# Patient Record
Sex: Male | Born: 1935 | Race: White | Hispanic: No | Smoking: Former smoker
Health system: Southern US, Community
[De-identification: ages and names within clinical notes are randomized; demographics above are authoritative.]

## PROBLEM LIST (undated history)

## (undated) DIAGNOSIS — R011 Cardiac murmur, unspecified: Secondary | ICD-10-CM

## (undated) DIAGNOSIS — I10 Essential (primary) hypertension: Secondary | ICD-10-CM

## (undated) DIAGNOSIS — I4891 Unspecified atrial fibrillation: Secondary | ICD-10-CM

## (undated) DIAGNOSIS — K469 Unspecified abdominal hernia without obstruction or gangrene: Secondary | ICD-10-CM

## (undated) DIAGNOSIS — K219 Gastro-esophageal reflux disease without esophagitis: Secondary | ICD-10-CM

## (undated) DIAGNOSIS — E7800 Pure hypercholesterolemia, unspecified: Secondary | ICD-10-CM

## (undated) DIAGNOSIS — C61 Malignant neoplasm of prostate: Secondary | ICD-10-CM

## (undated) DIAGNOSIS — N400 Enlarged prostate without lower urinary tract symptoms: Secondary | ICD-10-CM

## (undated) DIAGNOSIS — C3490 Malignant neoplasm of unspecified part of unspecified bronchus or lung: Secondary | ICD-10-CM

## (undated) DIAGNOSIS — I350 Nonrheumatic aortic (valve) stenosis: Secondary | ICD-10-CM

## (undated) DIAGNOSIS — E119 Type 2 diabetes mellitus without complications: Secondary | ICD-10-CM

## (undated) DIAGNOSIS — K5730 Diverticulosis of large intestine without perforation or abscess without bleeding: Secondary | ICD-10-CM

## (undated) DIAGNOSIS — J90 Pleural effusion, not elsewhere classified: Secondary | ICD-10-CM

## (undated) HISTORY — PX: INGUINAL HERNIA REPAIR: SHX194

## (undated) HISTORY — DX: Pure hypercholesterolemia, unspecified: E78.00

## (undated) HISTORY — DX: Diverticulosis of large intestine without perforation or abscess without bleeding: K57.30

## (undated) HISTORY — DX: Malignant neoplasm of prostate: C61

## (undated) HISTORY — DX: Type 2 diabetes mellitus without complications: E11.9

## (undated) HISTORY — PX: RADIOACTIVE SEED IMPLANT: SHX5150

## (undated) HISTORY — DX: Gastro-esophageal reflux disease without esophagitis: K21.9

## (undated) HISTORY — DX: Benign prostatic hyperplasia without lower urinary tract symptoms: N40.0

## (undated) HISTORY — DX: Essential (primary) hypertension: I10

## (undated) HISTORY — DX: Nonrheumatic aortic (valve) stenosis: I35.0

---

## 2005-01-10 ENCOUNTER — Ambulatory Visit: Payer: Self-pay | Admitting: Endocrinology

## 2005-01-31 ENCOUNTER — Ambulatory Visit: Payer: Self-pay | Admitting: Endocrinology

## 2006-01-21 ENCOUNTER — Ambulatory Visit: Payer: Self-pay | Admitting: Endocrinology

## 2006-12-24 ENCOUNTER — Ambulatory Visit: Payer: Self-pay | Admitting: Endocrinology

## 2007-01-21 ENCOUNTER — Ambulatory Visit: Payer: Self-pay | Admitting: Endocrinology

## 2007-01-21 LAB — CONVERTED CEMR LAB
AST: 22 units/L (ref 0–37)
Bilirubin, Direct: 0.1 mg/dL (ref 0.0–0.3)
Chloride: 107 meq/L (ref 96–112)
Cholesterol: 147 mg/dL (ref 0–200)
Creatinine, Ser: 1.1 mg/dL (ref 0.4–1.5)
Glucose, Bld: 144 mg/dL — ABNORMAL HIGH (ref 70–99)
PSA: 4.93 ng/mL — ABNORMAL HIGH (ref 0.10–4.00)
Sodium: 142 meq/L (ref 135–145)
Total Bilirubin: 0.8 mg/dL (ref 0.3–1.2)

## 2007-02-07 ENCOUNTER — Encounter: Payer: Self-pay | Admitting: *Deleted

## 2007-02-07 DIAGNOSIS — K219 Gastro-esophageal reflux disease without esophagitis: Secondary | ICD-10-CM

## 2007-02-07 DIAGNOSIS — K5730 Diverticulosis of large intestine without perforation or abscess without bleeding: Secondary | ICD-10-CM | POA: Insufficient documentation

## 2007-02-07 DIAGNOSIS — N400 Enlarged prostate without lower urinary tract symptoms: Secondary | ICD-10-CM

## 2007-02-07 DIAGNOSIS — I10 Essential (primary) hypertension: Secondary | ICD-10-CM | POA: Insufficient documentation

## 2007-05-11 ENCOUNTER — Encounter: Payer: Self-pay | Admitting: Endocrinology

## 2007-06-17 ENCOUNTER — Ambulatory Visit: Admission: RE | Admit: 2007-06-17 | Discharge: 2007-09-15 | Payer: Self-pay | Admitting: Radiation Oncology

## 2007-06-18 ENCOUNTER — Encounter: Payer: Self-pay | Admitting: Endocrinology

## 2007-06-24 ENCOUNTER — Encounter: Admission: RE | Admit: 2007-06-24 | Discharge: 2007-06-24 | Payer: Self-pay | Admitting: Urology

## 2007-07-17 ENCOUNTER — Ambulatory Visit: Payer: Self-pay | Admitting: Internal Medicine

## 2007-07-17 ENCOUNTER — Inpatient Hospital Stay (HOSPITAL_COMMUNITY): Admission: EM | Admit: 2007-07-17 | Discharge: 2007-07-20 | Payer: Self-pay | Admitting: Emergency Medicine

## 2007-07-24 ENCOUNTER — Encounter: Payer: Self-pay | Admitting: Internal Medicine

## 2007-07-27 ENCOUNTER — Ambulatory Visit: Payer: Self-pay | Admitting: Endocrinology

## 2007-08-17 ENCOUNTER — Encounter: Payer: Self-pay | Admitting: Endocrinology

## 2007-08-17 ENCOUNTER — Ambulatory Visit (HOSPITAL_BASED_OUTPATIENT_CLINIC_OR_DEPARTMENT_OTHER): Admission: RE | Admit: 2007-08-17 | Discharge: 2007-08-17 | Payer: Self-pay | Admitting: Urology

## 2007-09-07 ENCOUNTER — Encounter: Payer: Self-pay | Admitting: Endocrinology

## 2007-09-10 ENCOUNTER — Encounter: Payer: Self-pay | Admitting: Endocrinology

## 2007-12-28 ENCOUNTER — Telehealth: Payer: Self-pay | Admitting: Endocrinology

## 2007-12-28 ENCOUNTER — Ambulatory Visit: Payer: Self-pay | Admitting: Endocrinology

## 2007-12-28 DIAGNOSIS — E7800 Pure hypercholesterolemia, unspecified: Secondary | ICD-10-CM

## 2007-12-28 LAB — CONVERTED CEMR LAB
AST: 15 units/L (ref 0–37)
Bilirubin Urine: NEGATIVE
Chloride: 108 meq/L (ref 96–112)
Cholesterol: 178 mg/dL (ref 0–200)
Creatinine, Ser: 1.1 mg/dL (ref 0.4–1.5)
Direct LDL: 110.2 mg/dL
GFR calc Af Amer: 84 mL/min
GFR calc non Af Amer: 69 mL/min
Glucose, Urine, Semiquant: NEGATIVE
HDL: 30.5 mg/dL — ABNORMAL LOW (ref >39.0)
Nitrite: NEGATIVE
Protein, U semiquant: NEGATIVE
Specific Gravity, Urine: <1.005
TSH: 3.6 microintl units/mL (ref 0.35–5.50)
Total Bilirubin: 0.9 mg/dL (ref 0.3–1.2)
Total CHOL/HDL Ratio: 5.8
VLDL: 57 mg/dL — ABNORMAL HIGH (ref 0–40)

## 2008-01-29 ENCOUNTER — Ambulatory Visit: Payer: Self-pay | Admitting: Endocrinology

## 2008-03-28 ENCOUNTER — Ambulatory Visit: Payer: Self-pay | Admitting: Endocrinology

## 2008-03-31 LAB — CONVERTED CEMR LAB
ALT: 23 units/L (ref 0–53)
AST: 18 units/L (ref 0–37)
Albumin: 3.9 g/dL (ref 3.5–5.2)
Alkaline Phosphatase: 42 units/L (ref 39–117)
Cholesterol: 122 mg/dL (ref 0–200)
HDL: 26.9 mg/dL — ABNORMAL LOW (ref >39.0)
Total CHOL/HDL Ratio: 4.5
Total Protein: 6.7 g/dL (ref 6.0–8.3)
Triglycerides: 138 mg/dL (ref 0–149)

## 2008-09-09 ENCOUNTER — Telehealth: Payer: Self-pay | Admitting: Endocrinology

## 2008-09-26 ENCOUNTER — Ambulatory Visit: Payer: Self-pay | Admitting: Endocrinology

## 2008-09-26 LAB — CONVERTED CEMR LAB
Blood in Urine, dipstick: NEGATIVE
Nitrite: NEGATIVE
WBC Urine, dipstick: NEGATIVE
pH: 5

## 2008-12-06 ENCOUNTER — Telehealth: Payer: Self-pay | Admitting: Internal Medicine

## 2009-02-06 ENCOUNTER — Telehealth: Payer: Self-pay | Admitting: Endocrinology

## 2009-03-27 ENCOUNTER — Ambulatory Visit: Payer: Self-pay | Admitting: Endocrinology

## 2009-03-27 DIAGNOSIS — R011 Cardiac murmur, unspecified: Secondary | ICD-10-CM | POA: Insufficient documentation

## 2009-04-24 ENCOUNTER — Telehealth (INDEPENDENT_AMBULATORY_CARE_PROVIDER_SITE_OTHER): Payer: Self-pay | Admitting: *Deleted

## 2009-04-24 ENCOUNTER — Ambulatory Visit: Payer: Self-pay | Admitting: Endocrinology

## 2009-09-25 ENCOUNTER — Ambulatory Visit: Payer: Self-pay | Admitting: Endocrinology

## 2009-09-25 LAB — CONVERTED CEMR LAB: Hgb A1c MFr Bld: 6.2 % (ref 4.6–6.5)

## 2010-03-26 ENCOUNTER — Ambulatory Visit: Payer: Self-pay | Admitting: Endocrinology

## 2010-03-26 ENCOUNTER — Telehealth: Payer: Self-pay | Admitting: Endocrinology

## 2010-03-26 DIAGNOSIS — E1169 Type 2 diabetes mellitus with other specified complication: Secondary | ICD-10-CM

## 2010-03-26 DIAGNOSIS — E785 Hyperlipidemia, unspecified: Secondary | ICD-10-CM

## 2010-03-26 LAB — CONVERTED CEMR LAB
ALT: 13 units/L (ref 0–53)
BUN: 26 mg/dL — ABNORMAL HIGH (ref 6–23)
Bilirubin, Direct: 0.1 mg/dL (ref 0.0–0.3)
CO2: 27 meq/L (ref 19–32)
Chloride: 105 meq/L (ref 96–112)
Cholesterol: 130 mg/dL (ref 0–200)
Creatinine, Ser: 1.3 mg/dL (ref 0.4–1.5)
Eosinophils Absolute: 0.1 10*3/uL (ref 0.0–0.7)
Eosinophils Relative: 1.7 % (ref 0.0–5.0)
Glucose, Bld: 148 mg/dL — ABNORMAL HIGH (ref 70–99)
HCT: 42.5 % (ref 39.0–52.0)
Hgb A1c MFr Bld: 7.2 % — ABNORMAL HIGH (ref 4.6–6.5)
Leukocytes, UA: NEGATIVE
Lymphs Abs: 1.2 10*3/uL (ref 0.7–4.0)
MCHC: 34.4 g/dL (ref 30.0–36.0)
MCV: 87.8 fL (ref 78.0–100.0)
Monocytes Absolute: 0.6 10*3/uL (ref 0.1–1.0)
Neutrophils Relative %: 67 % (ref 43.0–77.0)
PSA: 0.13 ng/mL (ref 0.10–4.00)
Platelets: 184 10*3/uL (ref 150.0–400.0)
Potassium: 4.1 meq/L (ref 3.5–5.1)
RDW: 13.7 % (ref 11.5–14.6)
Renal Epithel, UA: NONE SEEN
TSH: 3.1 microintl units/mL (ref 0.35–5.50)
Total Bilirubin: 0.7 mg/dL (ref 0.3–1.2)
Triglycerides: 172 mg/dL — ABNORMAL HIGH (ref 0.0–149.0)
Urine Glucose: NEGATIVE mg/dL
Urobilinogen, UA: 1 (ref 0.0–1.0)

## 2010-06-11 ENCOUNTER — Encounter: Payer: Self-pay | Admitting: Endocrinology

## 2010-06-17 LAB — CONVERTED CEMR LAB
AST: 15 units/L (ref 0–37)
Albumin: 3.9 g/dL (ref 3.5–5.2)
Alkaline Phosphatase: 44 units/L (ref 39–117)
Bilirubin, Direct: 0.1 mg/dL (ref 0.0–0.3)
CO2: 30 meq/L (ref 19–32)
Calcium: 9.2 mg/dL (ref 8.4–10.5)
GFR calc non Af Amer: 68.84 mL/min (ref >60)
Glucose, Bld: 154 mg/dL — ABNORMAL HIGH (ref 70–99)
Hemoglobin, Urine: NEGATIVE
Nitrite: NEGATIVE
Potassium: 4.6 meq/L (ref 3.5–5.1)
Sodium: 139 meq/L (ref 135–145)
Total CHOL/HDL Ratio: 4
Total Protein, Urine: 100 mg/dL
Total Protein: 6.7 g/dL (ref 6.0–8.3)
Urine Glucose: NEGATIVE mg/dL
pH: 6.5 (ref 5.0–8.0)

## 2010-06-19 NOTE — Assessment & Plan Note (Signed)
Summary: 6 MTH FU---STC   Vital Signs:  Patient profile:   77 year old male Height:      70 inches (177.80 cm) Weight:      181.06 pounds (82.30 kg) BMI:     26.07 O2 Sat:      96 % on Room air Temp:     98.3 degrees F (36.83 degrees C) oral Pulse rate:   85 / minute BP sitting:   108 / 72  (left arm) Cuff size:   large  Vitals Entered By: Brenton Grills CMA Duncan Dull) (March 26, 2010 9:07 AM)  O2 Flow:  Room air CC: 6 month F/U/aj Is Patient Diabetic? No   CC:  6 month F/U/aj.  History of Present Illness: pt states he feels well in general. dyslipidemia: denies chest pain htn: denies sob.  he says azor is too expensive. dm: he has a few lbs of weight gain.    Current Medications (verified): 1)  Adult Aspirin Low Strength 81 Mg  Tbdp (Aspirin) .... Take 1 By Mouth Qd 2)  Zocor 80 Mg Tabs (Simvastatin) .... Qhs 3)  Hydrochlorothiazide 25 Mg Tabs (Hydrochlorothiazide) .Marland Kitchen.. 1 Qd 4)  Azor 5-40 Mg Tabs (Amlodipine-Olmesartan) .Marland Kitchen.. 1 Qd  Allergies (verified): 1)  ! Zestril  Past History:  Past Medical History: Last updated: 01/29/2008 HYPERCHOLESTEROLEMIA (ICD-272.0) HEPATOTOXICITY, DRUG-INDUCED, RISK OF (ICD-V58.69) BENIGN PROSTATIC HYPERTROPHY (ICD-600.00) HYPERTENSION (ICD-401.9) GERD (ICD-530.81) DIVERTICULOSIS, COLON (ICD-562.10) COLONIC POLYPS, HX OF (ICD-V12.72)  Review of Systems       denies decreased urinary stream, and incontinence  Physical Exam  General:  obese.  no distress  Neck:  Supple without thyroid enlargement or tenderness.  Lungs:  Clear to auscultation bilaterally. Normal respiratory effort.  Heart:  Regular rate and rhythm without murmurs or gallops noted. Normal S1,S2.   Pulses:  dorsalis pedis intact bilat.    Extremities:  no deformity.  no ulcer on the feet.  feet are of normal color and temp.  no edema there is rust-colored discoration mycotic toenails.   Neurologic:  sensation is intact to touch on the feet  Additional Exam:    Hemoglobin A1C       [H]  7.2 %   LDL Cholesterol           64 mg/dL   Impression & Recommendations:  Problem # 1:  DM (ICD-250.00) ecg is refused needs increased rx  Problem # 2:  HYPERCHOLESTEROLEMIA (ICD-272.0) well-controlled  Problem # 3:  HYPERTENSION (ICD-401.9) he requests a cheaper regimen  Medications Added to Medication List This Visit: 1)  Amlodipine Besylate 5 Mg Tabs (Amlodipine besylate) .Marland Kitchen.. 1 tab once daily 2)  Losartan Potassium 100 Mg Tabs (Losartan potassium) .Marland Kitchen.. 1 tab once daily 3)  Simvastatin 40 Mg Tabs (Simvastatin) .Marland Kitchen.. 1 tab once daily 4)  Metformin Hcl 500 Mg Xr24h-tab (Metformin hcl) .Marland Kitchen.. 1 tab each am  Other Orders: Flu Vaccine 68yrs + MEDICARE PATIENTS (E4540) Administration Flu vaccine - MCR (G0008) TLB-Lipid Panel (80061-LIPID) TLB-BMP (Basic Metabolic Panel-BMET) (80048-METABOL) TLB-CBC Platelet - w/Differential (85025-CBCD) TLB-Hepatic/Liver Function Pnl (80076-HEPATIC) TLB-TSH (Thyroid Stimulating Hormone) (84443-TSH) TLB-A1C / Hgb A1C (Glycohemoglobin) (83036-A1C) TLB-PSA (Prostate Specific Antigen) (84153-PSA) TLB-Udip w/ Micro (81001-URINE) Est. Patient Level IV (98119)  Patient Instructions: 1)  blood tests are being ordered for you today.  please call [phone] to hear your test results. 2)  pending the test results, please change azor to: 3)  losartan 100 mg once daily, and amlodipine 5 mg once daily. 4)  reduce  simvastatin to 40 mg once daily. 5)  Please schedule a "medicare wellness" appointment in 6 months. 6)  (update: i left message on phone-tree:  start metformin 500 mg once daily). Prescriptions: METFORMIN HCL 500 MG XR24H-TAB (METFORMIN HCL) 1 tab each am  #60 x 5   Entered and Authorized by:   Minus Breeding MD   Signed by:   Minus Breeding MD on 03/26/2010   Method used:   Electronically to        [facility] [address] (retail)       [address]       [place], [place]  [address]       Ph:  [phone] or [phone]       Fax: [phone]   RxID:   [id number] SIMVASTATIN 40 MG TABS (SIMVASTATIN) 1 tab once daily  #90 x 3   Entered and Authorized by:   Minus Breeding MD   Signed by:   Minus Breeding MD on 03/26/2010   Method used:   Electronically to        [facility] [address] (retail)       [address]       [place], [place]  [address]       Ph: [phone] or [phone]       Fax: [phone]   RxID:   [id number] LOSARTAN POTASSIUM 100 MG TABS (LOSARTAN POTASSIUM) 1 tab once daily  #90 x 3   Entered and Authorized by:   Minus Breeding MD   Signed by:   Minus Breeding MD on 03/26/2010   Method used:   Electronically to        [facility] [address] (retail)       [address]       [place], [place]  [address]       Ph: [phone] or [phone]       Fax: [phone]   RxID:   [id number] AMLODIPINE BESYLATE 5 MG TABS (AMLODIPINE BESYLATE) 1 tab once daily  #90 x 3   Entered and Authorized by:   Minus Breeding MD   Signed by:   Minus Breeding MD on 03/26/2010   Method used:   Electronically to        [facility] [address] (retail)       [address]       [place], [place]  [address]       Ph: [phone] or [phone]       Fax: [phone]   RxID:   [id number]    Orders Added: 1)  Flu Vaccine 55yrs + MEDICARE PATIENTS [Q2039] 2)  Administration Flu vaccine - MCR [G0008] 3)  TLB-Lipid Panel [80061-LIPID] 4)  TLB-BMP (Basic Metabolic Panel-BMET) [80048-METABOL] 5)  TLB-CBC Platelet - w/Differential [85025-CBCD] 6)  TLB-Hepatic/Liver Function Pnl [80076-HEPATIC] 7)  TLB-TSH (Thyroid Stimulating Hormone) [84443-TSH] 8)  TLB-A1C / Hgb A1C (Glycohemoglobin) [83036-A1C] 9)  TLB-PSA (Prostate Specific Antigen) [84153-PSA] 10)  TLB-Udip w/ Micro [81001-URINE] 11)  Est. Patient Level IV [61950]      Flu Vaccine Consent Questions     Do you have a history of  severe allergic reactions to this vaccine? no    Any prior history of allergic reactions to egg and/or gelatin? no  Do you have a sensitivity to the preservative Thimersol? no    Do you have a past history of Guillan-Barre Syndrome? no    Do you currently have an acute febrile illness? no    Have you ever had a severe reaction to latex? no    Vaccine information given and explained to patient? yes    Are you currently pregnant? no    Lot Number:[id number]   Exp Date:11/17/2010   Site Given  Left Deltoid [id number]

## 2010-06-19 NOTE — Assessment & Plan Note (Signed)
Summary: 6 MONTH FOLLOW UP-LB   Vital Signs:  Patient profile:   77 year old male Height:      70 inches (177.80 cm) Weight:      175.13 pounds (79.60 kg) BMI:     25.22 O2 Sat:      97 % on Room air Temp:     98.6 degrees F (37.00 degrees C) oral Pulse rate:   85 / minute BP sitting:   150 / 82  (left arm) Cuff size:   large  Vitals Entered By: Josph Macho RMA (Sep 25, 2009 9:00 AM)  O2 Flow:  Room air CC: 6 month follow up/ CF  Vision Screening:Left eye w/o correction: 20 / 30 Right Eye w/o correction: 20 / 70 Left eye with correction: 20 / 30 Right eye with correction: 20 / 40         CC:  6 month follow up/ CF.  History of Present Illness: pt says bp at home is 128/68. pt is here for medicare welllness visit.  he denies memory loss and depression.  he says he is able to perform activities of daily living without assistance.   Current Medications (verified): 1)  Adult Aspirin Low Strength 81 Mg  Tbdp (Aspirin) .... Take 1 By Mouth Qd 2)  Zocor 80 Mg Tabs (Simvastatin) .... Qhs 3)  Hydrochlorothiazide 25 Mg Tabs (Hydrochlorothiazide) .Marland Kitchen.. 1 Qd 4)  Azor 5-40 Mg Tabs (Amlodipine-Olmesartan) .Marland Kitchen.. 1 Qd  Allergies (verified): 1)  ! Zestril  Past History:  Past Medical History: Last updated: 01/29/2008 HYPERCHOLESTEROLEMIA (ICD-272.0) HEPATOTOXICITY, DRUG-INDUCED, RISK OF (ICD-V58.69) BENIGN PROSTATIC HYPERTROPHY (ICD-600.00) HYPERTENSION (ICD-401.9) GERD (ICD-530.81) DIVERTICULOSIS, COLON (ICD-562.10) COLONIC POLYPS, HX OF (ICD-V12.72)  Family History: Reviewed history and no changes required. no cancer  Social History: Reviewed history and no changes required. married retired says he diet is excellent  Review of Systems  The patient denies fever, vision loss, decreased hearing, chest pain, syncope, dyspnea on exertion, prolonged cough, headaches, abdominal pain, melena, hematochezia, severe indigestion/heartburn, and suspicious skin lesions.          he has lost weight, due to his efforts  Physical Exam  General:  normal appearance.   Head:  head: no deformity eyes: no periorbital swelling, no proptosis external nose and ears are normal mouth: no lesion seen Ears:  hearing is slightly decreased bilaterally (he once wore hearing aids, but he was told he no longer needs them. Neck:  Supple without thyroid enlargement or tenderness.  Lungs:  Clear to auscultation bilaterally. Normal respiratory effort.  Heart:  Regular rate and rhythm without gallops noted. Normal S1,S2.  there is a soft systolic murmur Abdomen:  abdomen is soft, nontender.  no hepatosplenomegaly.   not distended.  there is a small self-reducing ventral hernia  Rectal:  sees urology  Genitalia:  sees urology  Prostate:  sees urology  Msk:  pt performs "up and go" easy and steadily Pulses:  dorsalis pedis intact bilat.  no carotid bruit  Extremities:  no deformity.  no ulcer on the feet.  feet are of normal color and temp.  no edema there is rust-colored discoration mycotic toenails.   Neurologic:  cn 2-12 grossly intact.   readily moves all 4's.   sensation is intact to touch on the feet  Skin:  normal texture and temp.  no rash.  not diaphoretic  Cervical Nodes:  No significant adenopathy.  Psych:  oriented x 3 remembers 3/3 at 5 minutes excellent recall can easily  write a sentence.   Impression & Recommendations:  Problem # 1:  ROUTINE GENERAL MEDICAL EXAM@HEALTH  CARE FACL (ICD-V70.0)  Other Orders: TLB-A1C / Hgb A1C (Glycohemoglobin) (83036-A1C) Subsequent annual wellness visit with prevention plan (Z6109)  Patient Instructions: 1)  please consider these measures for your health:  minimize alcohol.  do not use tobacco products.  have a colonoscopy at least every 10 years from age 78.  keep firearms safely stored.  always use seat belts.  have working smoke alarms in your home.  see the dentist regularly.  never drive under the influence  of alcohol or drugs (including prescription drugs).  those with fair skin should take precautions against the sun. 2)  please let me know what your wishes would be, if artificial life support measures should become necessary.  it is critically important to prevent falling down (keep floor areas well-lit, dry, and free of loose objects) 3)  tests are being ordered for you today.  a few days after the test(s), please call [phone] to hear your test results. 4)  Please schedule a follow-up appointment in 6 months.

## 2010-06-19 NOTE — Progress Notes (Signed)
Summary: MED INTERACTION  Phone Note From Pharmacy   Caller: [facility] [address] Summary of Call: Pharm called, There is interaction between Simvastatin & Amlodipine. Pharm needs verbal ok from our office to fill.  Initial call taken by: Lamar Sprinkles, CMA,  March 26, 2010 12:35 PM  Follow-up for Phone Call        ok.  today, it was reduced from 80 to 40 mg once daily Follow-up by: Minus Breeding MD,  March 26, 2010 12:49 PM  Additional Follow-up for Phone Call Additional follow up Details #1::        Pharmacy states that pt's Insurance will not pay for anything else but Simvastatin 10mg  (maybe 20) and Amlodipine 5. Please advise, would you like to try pt on Simva 20 or 10? Additional Follow-up by: Margaret Pyle, CMA,  March 26, 2010 1:18 PM    Additional Follow-up for Phone Call Additional follow up Details #2::    i reduced to 20 mg once daily, and resent rx. Follow-up by: Minus Breeding MD,  March 26, 2010 7:05 PM  New/Updated Medications: SIMVASTATIN 20 MG TABS (SIMVASTATIN) 1 tab once daily Prescriptions: SIMVASTATIN 20 MG TABS (SIMVASTATIN) 1 tab once daily  #30 x 11   Entered and Authorized by:   Minus Breeding MD   Signed by:   Minus Breeding MD on 03/26/2010   Method used:   Electronically to        [facility] [address] (retail)       [address]       [place], [place]  [address]       Ph: [phone] or [phone]       Fax: [phone]   RxID:   [id number]

## 2010-06-27 NOTE — Letter (Signed)
[facility]   Imported By: Sherian Rein 06/18/2010 10:12:53  _____________________________________________________________________  External Attachment:    Type:   Image     Comment:   External Document

## 2010-09-17 ENCOUNTER — Ambulatory Visit: Payer: Self-pay | Admitting: Endocrinology

## 2010-09-24 ENCOUNTER — Other Ambulatory Visit (INDEPENDENT_AMBULATORY_CARE_PROVIDER_SITE_OTHER): Payer: Medicare Other

## 2010-09-24 ENCOUNTER — Encounter: Payer: Self-pay | Admitting: Endocrinology

## 2010-09-24 ENCOUNTER — Ambulatory Visit (INDEPENDENT_AMBULATORY_CARE_PROVIDER_SITE_OTHER): Payer: Medicare Other | Admitting: Endocrinology

## 2010-09-24 VITALS — BP 126/86 | HR 75 | Temp 97.8°F | Ht 70.0 in | Wt 177.8 lb

## 2010-09-24 DIAGNOSIS — R9431 Abnormal electrocardiogram [ECG] [EKG]: Secondary | ICD-10-CM

## 2010-09-24 DIAGNOSIS — E119 Type 2 diabetes mellitus without complications: Secondary | ICD-10-CM

## 2010-09-24 DIAGNOSIS — Z0000 Encounter for general adult medical examination without abnormal findings: Secondary | ICD-10-CM

## 2010-09-24 DIAGNOSIS — I10 Essential (primary) hypertension: Secondary | ICD-10-CM

## 2010-09-24 LAB — HEMOGLOBIN A1C: Hgb A1c MFr Bld: 6.4 % (ref 4.6–6.5)

## 2010-09-24 NOTE — Patient Instructions (Addendum)
Check "echo" (an easy and painless test, to see of the cardiogram is anything to worry about).  you will be called with a day and time for an appointment blood tests are being ordered for you today.  please call [phone] to hear your test results.  You will be prompted to enter the 9-digit "MRN" number that appears at the top left of this page, followed by #.  Then you will hear the message. If your blood sugar is high today, i may advise you to take an additional medication.  This may be a brand-name, but you should buy it if you can. please consider these measures for your health:  minimize alcohol.  do not use tobacco products.  have a colonoscopy at least every 10 years from age 46.  keep firearms safely stored.  always use seat belts.  have working smoke alarms in your home.  see an eye doctor and dentist regularly.  never drive under the influence of alcohol or drugs (including prescription drugs).  those with fair skin should take precautions against the sun. please let me know what your wishes would be, if artificial life support measures should become necessary.  it is critically important to prevent falling down (keep floor areas well-lit, dry, and free of loose objects) Please make a follow-up appointment in 6 months.

## 2010-09-24 NOTE — Progress Notes (Signed)
Subjective:    Patient ID: Adam Briggs, male    DOB: 07/10/1929, 77 y.o.   MRN: [id number]  HPI Subjective:   Patient here for Medicare annual wellness visit and management of other chronic and acute problems.    Past Medical History  Diagnosis Date  . HYPERCHOLESTEROLEMIA 12/28/2007  . HYPERTENSION 02/07/2007  . GERD 02/07/2007  . DIVERTICULOSIS, COLON 02/07/2007  . COLONIC POLYPS, HX OF 02/07/2007  . HEART MURMUR, SYSTOLIC 03/27/2009  . BENIGN PROSTATIC HYPERTROPHY 02/07/2007  . DM 03/26/2010    Past Surgical History  Procedure Date  . Inguinal hernia repair     Right    History   Social History  . Marital Status: Married    Spouse Name: N/A    Number of Children: N/A  . Years of Education: N/A   Occupational History  .      Retired   Social History Main Topics  . Smoking status: Former Games developer  . Smokeless tobacco: Not on file  . Alcohol Use: Not on file  . Drug Use: Not on file  . Sexually Active: Not on file   Other Topics Concern  . Not on file   Social History Narrative   Pt says his diet is excellent    Current Outpatient Prescriptions on File Prior to Visit  Medication Sig Dispense Refill  . amLODipine (NORVASC) 5 MG tablet Take 5 mg by mouth daily.        Marland Kitchen aspirin 81 MG tablet Take 81 mg by mouth daily.        . hydrochlorothiazide 25 MG tablet Take 25 mg by mouth daily.        Marland Kitchen losartan (COZAAR) 100 MG tablet Take 100 mg by mouth daily.        . metFORMIN (GLUCOPHAGE-XR) 500 MG 24 hr tablet Take 500 mg by mouth daily with breakfast.        . simvastatin (ZOCOR) 20 MG tablet Take 20 mg by mouth daily.          Allergies  Allergen Reactions  . Lisinopril     REACTION: Rash    Family History  Problem Relation Age of Onset  . Cancer Neg Hx     BP 126/86  Pulse 75  Temp(Src) 97.8 F (36.6 C) (Oral)  Ht 5\' 10"  (1.778 m)  Wt 177 lb 12.8 oz (80.65 kg)  BMI 25.51 kg/m2  SpO2 95%    Risk factors: advanced age    Roster of Physicians  Providing Medical Care to Patient: Urol: peterson Opthal: walker Rosalita Levan)  Activities of Daily Living: In your present state of health, do you have any difficulty performing the following activities?:  Preparing food and eating?: No Bathing yourself: No  Getting dressed: No  Using the toilet:No  Moving around from place to place: No  In the past year have you fallen or had a near fall?:No    Home Safety: Has smoke detector and wears seat belts. He has firearms. No excess sun exposure.  Diet and Exercise  Current exercise habits: pt reports good Dietary issues discussed: pt reports healthy diet   Depression Screen  Q1: Over the past two weeks, have you felt down, depressed or hopeless?no  Q2: Over the past two weeks, have you felt little interest or pleasure in doing things? no   The following portions of the patient's history were reviewed and updated as appropriate: allergies, current medications, past family history, past medical history, past social history, past  surgical history and problem list.   Review of Systems  Denies visual loss  He has mild hearing loss. Objective:   Vision:  Sees opthalmologist Hearing: grossly normal Body mass index:  Se vs page Msk: pt easily and quickly performs "get-up-and-go" from a sitting position Cognitive Impairment Assessment: cognition, memory and judgment appear normal.  remembers 3/3 at 5 minutes.  excellent recall.  can easily read and write a sentence.  alert and oriented x 3   Assessment:   Medicare wellness utd on preventive parameters    Plan:   During the course of the visit the patient was educated and counseled about appropriate screening and preventive services including:       Fall prevention    Diabetes screening  Nutrition counseling   Patient Instructions (the written plan) was given to the patient.         Review of Systems     Objective:   Physical Exam     SEPARATE EVALUATION FOLLOWS--EACH PROBLEM  HERE IS NEW, NOT RESPONDING TO TREATMENT, OR POSES SIGNIFICANT RISK TO THE PATIENT'S HEALTH: HISTORY OF THE PRESENT ILLNESS: Pt is unaware of any h/o heart dz.  No chest pain PAST MEDICAL HISTORY reviewed and up to date today REVIEW OF SYSTEMS: Denies sob PHYSICAL EXAMINATION: LUNGS:  Clear to auscultation HEART:Regular rate and rhythm without murmurs noted. Normal S1,S2.   Pulses: dorsalis pedis intact bilat.   Feet: no deformity.  no ulcer on the feet.  feet are of normal color and temp.  no edema.  There is bilat onychomycosis Neuro: sensation is intact to touch on the feet LAB/XRAY RESULTS: ecg is noted IMPRESSION: i reviewed ecg PLAN: See instruction page  Assessment & Plan:

## 2010-10-02 ENCOUNTER — Other Ambulatory Visit (HOSPITAL_COMMUNITY): Payer: Medicare Other

## 2010-10-02 NOTE — Op Note (Signed)
NAME:  Adam Briggs, Adam Briggs                 ACCOUNT NO.:  [id number]   MEDICAL RECORD NO.:  [id number]          PATIENT TYPE:  AMB   LOCATION:  [facility]                         FACILITY:  [facility]   PHYSICIAN:  Maretta Bees. Vonita Moss, M.D.DATE OF BIRTH:  1929/12/17   DATE OF PROCEDURE:  08/17/2007  DATE OF DISCHARGE:                               OPERATIVE REPORT   PREOPERATIVE DIAGNOSIS:  Prostatic carcinoma.   POSTOPERATIVE DIAGNOSIS:  Prostatic carcinoma.   </PROCEDURES>  1. Radioactive seed implantation.  2. Cystoscopy.   SURGEON:  Maretta Bees. Vonita Moss, M.D.   ASSISTANT:  Maryln Gottron, M.D.   ANESTHESIA:  General.   INDICATIONS:  This 77 year old gentleman had a PSA of just over 4 and  prostate biopsy showed Gleason 6 carcinoma in significant volume,  warranting therapy with radiation.  He was counseled about bleeding,  voiding troubles rectal injury, im potence and incontinence.   PROCEDURE:  The patient was brought to the operating room and placed in  lithotomy position and the transrectal ultrasound probe inserted and a  Foley catheter inserted and treatment planning was undertaken.  He then  underwent radioactive seed implantation by using 26 activated needles,  implanting 63 seeds under ultrasonic and fluoroscopic control.  Seed  implantation was felt to be excellent.  He then underwent cystoscopy and  showed no injury or bleeding or seeds in the urethra or bladder.  Foley  catheter was reinserted and the patient taken to the recovery room in  good condition, having tolerated the procedure well with minimal  bleeding.      Maretta Bees. Vonita Moss, M.D.  Electronically Signed     LJP/MEDQ  D:  08/17/2007  T:  08/17/2007  Job:  [id number]   cc:   Maryln Gottron, M.D.  Fax: [phone]

## 2010-10-02 NOTE — H&P (Signed)
NAME:  Adam Briggs, Adam Briggs NO.:  [id number]   MEDICAL RECORD NO.:  [id number]          PATIENT TYPE:  INP   LOCATION:  1337                         FACILITY:  [facility]   PHYSICIAN:  Therisa Doyne, MD    DATE OF BIRTH:  [birth year]   DATE OF ADMISSION:  07/16/2007  DATE OF DISCHARGE:                              HISTORY & PHYSICAL   PRIMARY CARE PHYSICIAN:  Sean A. Everardo All, MD   CHIEF COMPLAINT:  Nausea, vomiting, and abdominal pain.   HISTORY OF PRESENT ILLNESS:  This is a 77 year old white male with a  past medical history significant for hypertension, hyperlipidemia, who  presents with a three-day history of abdominal pain, distention, and  vomiting.  The patient reports diffuse abdominal swelling for the past  three days.  He also reports three to four episodes of vomiting daily  for the past four days.  Initially, he had some diarrhea, but this has  since resolved.  He denies any sick contacts.  Because of these  symptoms, he came to the emergency department for further evaluation.  He received Zofran and morphine and was found to have a partial small  bowel obstruction.   REVIEW OF SYSTEMS:  All systems were reviewed and negative except those  mentioned above in the history of present illness.   PAST MEDICAL HISTORY:  1. Hypertension.  2. Hyperlipidemia.  3. History of prostate cancer.   SOCIAL HISTORY:  The patient lives in [place].  He does not use  tobacco, alcohol or drugs.   FAMILY HISTORY:  Positive for hypertension.   ALLERGIES:  NO KNOWN DRUG ALLERGIES.   MEDICATIONS:  The patient is unsure of the dosages but his medicines  include:  1. Nifedipine.  2. Bisoprolol.  3. HCTZ.  4. Pravastatin.   PHYSICAL EXAMINATION:  VITAL SIGNS:  Temperature 99.5, blood pressure  116/78, pulse 96, respirations 18, oxygen saturation 95% on room air.  GENERAL:  In no acute distress.  HEENT:  Normocephalic, atraumatic, pupils are equal, round, reactive to  light and accommodation, extraocular movements are intact, oropharynx  pink, without any lesions.  NECK:  Supple, no lymphadenopathy, no jugular venous distention, no  masses.  CARDIOVASCULAR:  Regular rate and rhythm, no murmurs, rubs or gallops.  LUNGS:  Clear to auscultation bilaterally.  ABDOMEN:  Decreased bowel sounds, high-pitched distended abdomen,  nontender to palpation, no rebound or guarding.  EXTREMITIES:  No clubbing, cyanosis or edema.   CBC within normal limits.  BNP revealed elevated BUN of 31 and  creatinine of 1.8.   CT scan of the abdomen showed a partial small bowel obstruction with no  specific lesion seen.   ASSESSMENT/PLAN:  1. Admit to the [employer].   1. Partial small-bowel obstruction.  It is likely a functional ileus      secondary to the patient's gastroenteritis.  Of note, he has never      had any abdominal surgeries, and that is what makes adhesions much      less likely.  Because he is symptomatically improved at this  moment, we will not use an NG tube for suction.  We will keep him      NPO and conservatively manage him with normal saline and      intravenous fluids at 125 cc per hour, p.r.n. morphine and p.r.n.      Phenergan.  We will check a KUB in the morning and monitor his      serial abdominal exams.   1. Acute renal failure.  Unclear what his baseline is, but he likely      has prerenal azotemia for volume depletion in the setting of      vomiting and diarrhea.  BUN creatinine ratio is consistent with      this.  Because of this, we will volume resuscitate him with normal      saline at 125 cc per hour and check a base metabolic profile in the      morning.   1. Hypertension.  Will hold the patient's home medication to increased      renal perfusion.   1. F/E/N: normal saline at 125 cc per hour.  Electrolytes are stable,      NPO.   1. For deep venous thrombosis  prophylaxis, subcutaneous heparin.       Therisa Doyne, MD  Electronically Signed     SJT/MEDQ  D:  07/17/2007  T:  07/17/2007  Job:  [id number]

## 2010-10-02 NOTE — Discharge Summary (Signed)
NAME:  Adam Briggs, Adam Briggs                 ACCOUNT NO.:  [id number]   MEDICAL RECORD NO.:  [id number]          PATIENT TYPE:  INP   LOCATION:  1337                         [facility]ACILITY:  [facility]   PHYSICIAN:  Rosalyn Gess. Norins, MD  DATE OF BIRTH:  06/05/1929   DATE OF ADMISSION:  07/16/2007  DATE OF DISCHARGE:  07/20/2007                               DISCHARGE SUMMARY   ADMITTING DIAGNOSES:  1. Ileus versus small-bowel obstruction.  2. Acute renal insufficiency.   DISCHARGE DIAGNOSES:  1. Ileus versus small-bowel obstruction.  2. Acute renal insufficiency.   CONSULTANTS:  None.   PROCEDURES:  1. Acute abdominal series July 16, 2007, with high-grade small-      bowel obstruction.  2. CT scan of the abdomen and pelvis July 17, 2007, which showed      partial small-bowel obstruction with transition zone between the      jejunum and ileum with no specific obstructing lesions identified.      Pelvis with no primary pelvic pathology of significance.  Dilated      small-bowel loops also seen in this region consistent with partial      small-bowel obstruction.  Small left inguinal hernia containing a      bit of fat.  3. KUB July 18, 2007, improving for a persistent partial small-      bowel obstruction.  4. KUB pending March, 2, 2009.   HISTORY OF PRESENT ILLNESS:  The patient is a 77 year old gentleman with  past medical history of hypertension, hyperlipidemia who presented with  a 3-day history of abdominal pain, distention and vomiting.  He had  diffuse abdominal swelling for 3 days.  He also had three to four  episodes of vomiting for the past 4 days.  He initially had some  diarrhea, but this resolved.  In the emergency department, he was found  to have a partial small-bowel obstruction by x-ray and acute renal  insufficiency and was subsequently admitted.  Please see H&P for past  medical history, family history, social history and exam.   HOSPITAL COURSE:  1. Partial  small-bowel obstruction.  The patient was put to n.p.o. IV      fluids.  He did not require an NG tube.  Over the next several      days, the patient's partial small-bowel obstruction improved.  He      was able to ambulate.  He was able to advance his diet, and at the      time of this dictation, had a full breakfast and full supper the      night prior.  The patient did have one bowel movement at 6:00 a.m.      on the day of discharge.  Examination did reveal the patient to      have positive bowel sounds.  With the patient being able tolerate a      diet, positive bowel sounds and passing gas and stool, he was      thought to be ready for discharge pending x-ray.  If his x-ray is  abnormal, will need to reconsider and possibly consider surgical      consult.  2. Acute renal insufficiency.  The patient had a creatinine 1.8 at      admission and was thought to be dehydrated.  However, his      hospitalization, he was given IV fluids.  Creatinine normalized to      1.08.  This problem was resolved.  3. Chronic constipation.  The patient does have a history of chronic      constipation.  He was given a dose of MiraLax on July 19, 2007.  At      this time, he will be sent home with instructions use Milk of      Magnesia, full capful every 4 hours until adequate bowel movement,      and then one half or one capful nightly.   DISCHARGE EXAMINATION:  VITAL SIGNS:  Temperature 98.2, blood pressure  113/60, pulse 99, respirations 22, O2 sats 93% on room air.  GENERAL APPEARANCE:  This is a well-nourished gentleman looking younger  than stated age of 77 in no acute distress.  He is sitting on the side  of the bed eating his breakfast.  ABDOMEN: The patient's abdomen was firm.  He had positive bowel sounds.  There was no guarding or rebound.  No further examination conducted.   DISPOSITION:  The patient will be discharged home if his x-ray is  unremarkable.   DISCHARGE MEDICATIONS:  He  will continue on his home medications  including:  1. Bisoprolol HCTZ 2.5/6.25 once daily.  2. Nifedipine 60 mg daily.  3. Pravastatin 40 mg daily.  4. Aspirin 81 mg daily.  5. He is instructed to use Milk of Magnesia as noted.   FOLLOWUP:  The patient will contact Dr. Harlow Asa office for follow-  up evaluation in 5-7 days.   CONDITION ON DISCHARGE:  The patient's condition at time of discharge  dictation is improved and stable.      Rosalyn Gess Norins, MD  Electronically Signed     MEN/MEDQ  D:  07/20/2007  T:  07/20/2007  Job:  [id number]   cc:   Gregary Signs A. Everardo All, MD  [address]  [place]  [place] [address]

## 2010-10-16 ENCOUNTER — Ambulatory Visit (HOSPITAL_COMMUNITY): Payer: Medicare Other | Attending: Endocrinology | Admitting: Radiology

## 2010-10-16 DIAGNOSIS — E119 Type 2 diabetes mellitus without complications: Secondary | ICD-10-CM | POA: Insufficient documentation

## 2010-10-16 DIAGNOSIS — I10 Essential (primary) hypertension: Secondary | ICD-10-CM | POA: Insufficient documentation

## 2010-10-16 DIAGNOSIS — E785 Hyperlipidemia, unspecified: Secondary | ICD-10-CM | POA: Insufficient documentation

## 2010-10-16 DIAGNOSIS — I350 Nonrheumatic aortic (valve) stenosis: Secondary | ICD-10-CM

## 2010-10-16 DIAGNOSIS — I059 Rheumatic mitral valve disease, unspecified: Secondary | ICD-10-CM | POA: Insufficient documentation

## 2010-10-16 DIAGNOSIS — R9431 Abnormal electrocardiogram [ECG] [EKG]: Secondary | ICD-10-CM | POA: Insufficient documentation

## 2010-10-16 NOTE — Patient Instructions (Signed)
i left message on phone tree i advised repeat echo in 1 year

## 2010-10-19 ENCOUNTER — Telehealth: Payer: Self-pay

## 2010-10-19 NOTE — Telephone Encounter (Signed)
Please access phone-tree

## 2010-10-19 NOTE — Telephone Encounter (Signed)
Pt's daughter called requesting results of ECHO done 05/29

## 2010-10-19 NOTE — Telephone Encounter (Signed)
Daughter advised.

## 2010-11-19 ENCOUNTER — Other Ambulatory Visit: Payer: Self-pay | Admitting: Endocrinology

## 2011-02-08 LAB — CBC
HCT: 44.4
Hemoglobin: 15.3
MCHC: 34.3
MCV: 81.7
RDW: 14.5

## 2011-02-08 LAB — COMPREHENSIVE METABOLIC PANEL
Alkaline Phosphatase: 50
BUN: 31 — ABNORMAL HIGH
Calcium: 9.2
Creatinine, Ser: 1.75 — ABNORMAL HIGH
Glucose, Bld: 192 — ABNORMAL HIGH
Potassium: 3.7
Total Protein: 7.2

## 2011-02-08 LAB — BASIC METABOLIC PANEL
CO2: 23
Calcium: 7.7 — ABNORMAL LOW
GFR calc Af Amer: >60
GFR calc non Af Amer: >60
Sodium: 133 — ABNORMAL LOW

## 2011-02-08 LAB — URINALYSIS, ROUTINE W REFLEX MICROSCOPIC
Leukocytes, UA: NEGATIVE
Nitrite: NEGATIVE
Protein, ur: 100 — AB

## 2011-02-08 LAB — DIFFERENTIAL
Basophils Relative: 0
Monocytes Relative: 16 — ABNORMAL HIGH
Neutro Abs: 5.3
Neutrophils Relative %: 74

## 2011-02-11 LAB — COMPREHENSIVE METABOLIC PANEL
ALT: 21
Alkaline Phosphatase: 54
CO2: 29
GFR calc non Af Amer: >60
Glucose, Bld: 148 — ABNORMAL HIGH
Potassium: 4.3
Sodium: 140

## 2011-02-11 LAB — BASIC METABOLIC PANEL
GFR calc non Af Amer: >60
Glucose, Bld: 95
Potassium: 4
Sodium: 137

## 2011-02-11 LAB — CBC
Hemoglobin: 13.2
RBC: 4.78
WBC: 4.2

## 2011-02-11 LAB — MAGNESIUM: Magnesium: 2.2

## 2011-02-11 LAB — PROTIME-INR: Prothrombin Time: 12.8

## 2011-03-25 ENCOUNTER — Other Ambulatory Visit (INDEPENDENT_AMBULATORY_CARE_PROVIDER_SITE_OTHER): Payer: Medicare Other

## 2011-03-25 ENCOUNTER — Encounter: Payer: Self-pay | Admitting: Endocrinology

## 2011-03-25 ENCOUNTER — Ambulatory Visit (INDEPENDENT_AMBULATORY_CARE_PROVIDER_SITE_OTHER): Payer: Medicare Other | Admitting: Endocrinology

## 2011-03-25 VITALS — BP 132/70 | HR 81 | Temp 98.3°F | Ht 70.0 in | Wt 174.4 lb

## 2011-03-25 DIAGNOSIS — E119 Type 2 diabetes mellitus without complications: Secondary | ICD-10-CM

## 2011-03-25 MED ORDER — LOSARTAN POTASSIUM 100 MG PO TABS: 100.0000 mg | ORAL_TABLET | Freq: Every day | ORAL | Status: DC

## 2011-03-25 MED ORDER — AMLODIPINE BESYLATE 5 MG PO TABS: 5.0000 mg | ORAL_TABLET | Freq: Every day | ORAL | Status: DC

## 2011-03-25 MED ORDER — SIMVASTATIN 20 MG PO TABS: 20.0000 mg | ORAL_TABLET | Freq: Every day | ORAL | Status: DC

## 2011-03-25 MED ORDER — METFORMIN HCL ER 500 MG PO TB24: 500.0000 mg | ORAL_TABLET | Freq: Every day | ORAL | Status: DC

## 2011-03-25 MED ORDER — HYDROCHLOROTHIAZIDE 25 MG PO TABS: 25.0000 mg | ORAL_TABLET | Freq: Every day | ORAL | Status: DC

## 2011-03-25 NOTE — Patient Instructions (Signed)
blood tests are being requested for you today.  please call [phone] to hear your test results.  You will be prompted to enter the 9-digit "MRN" number that appears at the top left of this page, followed by #.  Then you will hear the message. Please come back for a "medicare wellness" appointment in 6 months. pending the test results, please continue the same medications for now.

## 2011-03-25 NOTE — Progress Notes (Signed)
  Subjective:    Patient ID: Adam Briggs, male    DOB: [birth year], 77 y.o.   MRN: [id number]  HPI pt states he feels well in general.  no cbg record, but states cbg's are well-controlled. Past Medical History  Diagnosis Date  . HYPERCHOLESTEROLEMIA 12/28/2007  . HYPERTENSION 02/07/2007  . GERD 02/07/2007  . DIVERTICULOSIS, COLON 02/07/2007  . COLONIC POLYPS, HX OF 02/07/2007  . HEART MURMUR, SYSTOLIC 03/27/2009  . BENIGN PROSTATIC HYPERTROPHY 02/07/2007  . DM 03/26/2010    Past Surgical History  Procedure Date  . Inguinal hernia repair     Right    History   Social History  . Marital Status: Married    Spouse Name: N/A    Number of Children: N/A  . Years of Education: N/A   Occupational History  .      Retired   Social History Main Topics  . Smoking status: Former Games developer  . Smokeless tobacco: Not on file  . Alcohol Use: Not on file  . Drug Use: Not on file  . Sexually Active: Not on file   Other Topics Concern  . Not on file   Social History Narrative   Pt says his diet is excellent    Current Outpatient Prescriptions on File Prior to Visit  Medication Sig Dispense Refill  . amLODipine (NORVASC) 5 MG tablet Take 5 mg by mouth daily.        Marland Kitchen aspirin 81 MG tablet Take 81 mg by mouth daily.        . hydrochlorothiazide 25 MG tablet TAKE ONE TABLET BY MOUTH EVERY DAY  30 tablet  9  . losartan (COZAAR) 100 MG tablet Take 100 mg by mouth daily.        . metFORMIN (GLUCOPHAGE-XR) 500 MG 24 hr tablet Take 500 mg by mouth daily with breakfast.        . simvastatin (ZOCOR) 20 MG tablet Take 20 mg by mouth daily.          Allergies  Allergen Reactions  . Lisinopril     REACTION: Rash    Family History  Problem Relation Age of Onset  . Cancer Neg Hx     BP 132/70  Pulse 81  Temp(Src) 98.3 F (36.8 C) (Oral)  Ht 5\' 10"  (1.778 m)  Wt 174 lb 6 oz (79.096 kg)  BMI 25.02 kg/m2  SpO2 95%    Review of Systems He has lost a few lbs, due to his efforts.        Objective:   Physical Exam VITAL SIGNS:  See vs page GENERAL: no distress NECK: There is no palpable thyroid enlargement.  No thyroid nodule is palpable.  No palpable lymphadenopathy at the anterior neck. Pulses: dorsalis pedis intact bilat.  Feet: no deformity. no ulcer on the feet. feet are of normal color and temp. no edema. There is bilat onychomycosis  Neuro: sensation is intact to touch on the feet     Assessment & Plan:  Dm, apparently well-controlled

## 2011-09-23 ENCOUNTER — Telehealth: Payer: Self-pay | Admitting: *Deleted

## 2011-09-23 ENCOUNTER — Other Ambulatory Visit (INDEPENDENT_AMBULATORY_CARE_PROVIDER_SITE_OTHER): Payer: Medicare Other

## 2011-09-23 ENCOUNTER — Ambulatory Visit (INDEPENDENT_AMBULATORY_CARE_PROVIDER_SITE_OTHER): Payer: Medicare Other | Admitting: Endocrinology

## 2011-09-23 VITALS — BP 148/86 | HR 77 | Temp 97.6°F | Ht 70.0 in | Wt 170.0 lb

## 2011-09-23 DIAGNOSIS — K219 Gastro-esophageal reflux disease without esophagitis: Secondary | ICD-10-CM

## 2011-09-23 DIAGNOSIS — Z86010 Personal history of colonic polyps: Secondary | ICD-10-CM

## 2011-09-23 DIAGNOSIS — E119 Type 2 diabetes mellitus without complications: Secondary | ICD-10-CM | POA: Diagnosis not present

## 2011-09-23 DIAGNOSIS — E7800 Pure hypercholesterolemia, unspecified: Secondary | ICD-10-CM

## 2011-09-23 DIAGNOSIS — IMO0001 Reserved for inherently not codable concepts without codable children: Secondary | ICD-10-CM

## 2011-09-23 DIAGNOSIS — Z0000 Encounter for general adult medical examination without abnormal findings: Secondary | ICD-10-CM

## 2011-09-23 DIAGNOSIS — I10 Essential (primary) hypertension: Secondary | ICD-10-CM

## 2011-09-23 DIAGNOSIS — Z79899 Other long term (current) drug therapy: Secondary | ICD-10-CM

## 2011-09-23 DIAGNOSIS — N400 Enlarged prostate without lower urinary tract symptoms: Secondary | ICD-10-CM

## 2011-09-23 DIAGNOSIS — I350 Nonrheumatic aortic (valve) stenosis: Secondary | ICD-10-CM

## 2011-09-23 DIAGNOSIS — I359 Nonrheumatic aortic valve disorder, unspecified: Secondary | ICD-10-CM

## 2011-09-23 LAB — MICROALBUMIN / CREATININE URINE RATIO
Creatinine,U: 131.9 mg/dL
Microalb Creat Ratio: 12.7 mg/g (ref 0.0–30.0)
Microalb, Ur: 16.8 mg/dL — ABNORMAL HIGH (ref 0.0–1.9)

## 2011-09-23 LAB — BASIC METABOLIC PANEL
BUN: 20 mg/dL (ref 6–23)
Calcium: 9.5 mg/dL (ref 8.4–10.5)
Creatinine, Ser: 1 mg/dL (ref 0.4–1.5)
GFR: 76.36 mL/min (ref >60.00)

## 2011-09-23 LAB — CBC WITH DIFFERENTIAL/PLATELET
Basophils Absolute: 0 10*3/uL (ref 0.0–0.1)
Eosinophils Absolute: 0.2 10*3/uL (ref 0.0–0.7)
HCT: 42.7 % (ref 39.0–52.0)
Lymphs Abs: 0.8 10*3/uL (ref 0.7–4.0)
MCHC: 33 g/dL (ref 30.0–36.0)
Monocytes Relative: 7.8 % (ref 3.0–12.0)
Platelets: 173 10*3/uL (ref 150.0–400.0)
RDW: 14.3 % (ref 11.5–14.6)

## 2011-09-23 LAB — HEPATIC FUNCTION PANEL
ALT: 13 U/L (ref 0–53)
AST: 14 U/L (ref 0–37)
Albumin: 4.2 g/dL (ref 3.5–5.2)
Alkaline Phosphatase: 41 U/L (ref 39–117)
Total Bilirubin: 1.1 mg/dL (ref 0.3–1.2)

## 2011-09-23 LAB — LIPID PANEL
Cholesterol: 136 mg/dL (ref 0–200)
LDL Cholesterol: 76 mg/dL (ref 0–99)
Total CHOL/HDL Ratio: 3
VLDL: 20.8 mg/dL (ref 0.0–40.0)

## 2011-09-23 LAB — URINALYSIS, ROUTINE W REFLEX MICROSCOPIC
Bilirubin Urine: NEGATIVE
Hgb urine dipstick: NEGATIVE
Ketones, ur: NEGATIVE
Leukocytes, UA: NEGATIVE
Nitrite: NEGATIVE
Urobilinogen, UA: 0.2 (ref 0.0–1.0)
pH: 6.5 (ref 5.0–8.0)

## 2011-09-23 LAB — TSH: TSH: 2.18 u[IU]/mL (ref 0.35–5.50)

## 2011-09-23 LAB — HEMOGLOBIN A1C: Hgb A1c MFr Bld: 6 % (ref 4.6–6.5)

## 2011-09-23 LAB — PSA: PSA: 0.05 ng/mL — ABNORMAL LOW (ref 0.10–4.00)

## 2011-09-23 NOTE — Patient Instructions (Addendum)
please consider these measures for your health:  minimize alcohol.  do not use tobacco products.  have a colonoscopy at least every 10 years from age 78.  keep firearms safely stored.  always use seat belts.  have working smoke alarms in your home.  see an eye doctor and dentist regularly.  never drive under the influence of alcohol or drugs (including prescription drugs).   please let me know what your wishes would be, if artificial life support measures should become necessary.  it is critically important to prevent falling down (keep floor areas well-lit, dry, and free of loose objects.  If you have a cane, walker, or wheelchair, you should use it, even for short trips around the house.  Also, try not to rush). good diet and exercise habits significanly improve the control of your diabetes.  please let me know if you wish to be referred to a dietician.  high blood sugar is very risky to your health.  you should see an eye doctor every year. controlling your blood pressure and cholesterol drastically reduces the damage diabetes does to your body.  this also applies to quitting smoking.  please discuss these with your doctor.  you should take an aspirin every day, unless you have been advised by a doctor not to. check your blood sugar once a day.  vary the time of day when you check, between before the 3 meals, and at bedtime.  also check if you have symptoms of your blood sugar being too high or too low.  please keep a record of the readings and bring it to your next appointment here.  please call us sooner if your blood sugar goes below 70, or if it stays over 200. blood tests are being requested for you today.  You will receive a letter with results. Let's recheck the "echocardiogram," like you had last year.  you will receive a phone call, about a day and time for an appointment Please come back for a follow-up appointment in 6 months.  We'll recheck the blood pressure then.   You should have a vaccine  against shingles (a painful rash which results from the  chickenpox infection which most people had many years ago).  This vaccine reduces, but does not totally eliminate the risk of shingles.  Because this is a medicare part d benefit, you should get it at a pharmacy.   Refer for a colonoscopy.  you will receive a phone call, about a day and time for an appointment.

## 2011-09-23 NOTE — Progress Notes (Signed)
Subjective:    Patient ID: Adam Briggs, male    DOB: 04-20-1930, 78 y.o.   MRN: [id number]  HPI The state of at least three ongoing medical problems is addressed today: Dyslipidemia: Denies chest pain Aortic stenosis: Denies sob BPH: Denies decreased urinary stream Past Medical History  Diagnosis Date  . HYPERCHOLESTEROLEMIA 12/28/2007  . HYPERTENSION 02/07/2007  . GERD 02/07/2007  . DIVERTICULOSIS, COLON 02/07/2007  . COLONIC POLYPS, HX OF 02/07/2007  . HEART MURMUR, SYSTOLIC 03/27/2009  . BENIGN PROSTATIC HYPERTROPHY 02/07/2007  . DM 03/26/2010    Past Surgical History  Procedure Date  . Inguinal hernia repair     Right    History   Social History  . Marital Status: Married    Spouse Name: N/A    Number of Children: N/A  . Years of Education: N/A   Occupational History  .      Retired   Social History Main Topics  . Smoking status: Former Games developer  . Smokeless tobacco: Not on file  . Alcohol Use: Not on file  . Drug Use: Not on file  . Sexually Active: Not on file   Other Topics Concern  . Not on file   Social History Narrative   Pt says his diet is excellent    Current Outpatient Prescriptions on File Prior to Visit  Medication Sig Dispense Refill  . amLODipine (NORVASC) 5 MG tablet Take 1 tablet (5 mg total) by mouth daily.  90 tablet  3  . aspirin 81 MG tablet Take 81 mg by mouth daily.        . hydrochlorothiazide (HYDRODIURIL) 25 MG tablet Take 1 tablet (25 mg total) by mouth daily.  90 tablet  3  . losartan (COZAAR) 100 MG tablet Take 1 tablet (100 mg total) by mouth daily.  90 tablet  3  . metFORMIN (GLUCOPHAGE-XR) 500 MG 24 hr tablet Take 1 tablet (500 mg total) by mouth daily with breakfast.  90 tablet  3  . simvastatin (ZOCOR) 20 MG tablet Take 1 tablet (20 mg total) by mouth daily.  90 tablet  3   Allergies  Allergen Reactions  . Lisinopril     REACTION: Rash    Family History  Problem Relation Age of Onset  . Cancer Neg Hx    BP 148/86   Pulse 77  Temp(Src) 97.6 F (36.4 C) (Oral)  Ht 5\' 10"  (1.778 m)  Wt 170 lb (77.111 kg)  BMI 24.39 kg/m2  SpO2 95%  Review of Systems  Constitutional:       He has lost a few lbs, due to his efforts  Eyes: Negative for visual disturbance.  Gastrointestinal: Negative for anal bleeding.  Genitourinary: Negative for hematuria.       Objective:   Physical Exam VS: see vs page GEN: no distress HEAD: head: no deformity eyes: no periorbital swelling, no proptosis external nose and ears are normal mouth: no lesion seen NECK: supple, thyroid is not enlarged CHEST WALL: no deformity LUNGS: clear to auscultation CV: reg rate and rhythm. 3/6 systolic murmur MUSCULOSKELETAL: muscle bulk and strength are grossly normal.  no obvious joint swelling.  gait is normal and steady EXTEMITIES: no deformity.  no ulcer on the feet.  feet are of normal color and temp.  no edema.  There is bilateral onychomycosis. PULSES: dorsalis pedis intact bilat.  no carotid bruit, but there is bilat transmitted murmur NEURO:  cn 2-12 grossly intact.   readily moves all 4's.  sensation is intact to touch on the feet SKIN:  Normal texture and temperature.  No rash or suspicious lesion is visible.   NODES:  None palpable at the neck.   PSYCH: alert, oriented x3.  Does not appear anxious nor depressed.  Lab Results  Component Value Date   WBC 4.9 09/23/2011   HGB 14.1 09/23/2011   HCT 42.7 09/23/2011   PLT 173.0 09/23/2011   GLUCOSE 114* 09/23/2011   CHOL 136 09/23/2011   TRIG 104.0 09/23/2011   HDL 39.00* 09/23/2011   LDLDIRECT 110.2 12/28/2007   LDLCALC 76 09/23/2011   ALT 13 09/23/2011   AST 14 09/23/2011   NA 140 09/23/2011   K 4.3 09/23/2011   CL 104 09/23/2011   CREATININE 1.0 09/23/2011   BUN 20 09/23/2011   CO2 27 09/23/2011   TSH 2.18 09/23/2011   PSA 0.05* 09/23/2011   INR 0.9 08/10/2007   HGBA1C 6.0 09/23/2011   MICROALBUR 16.8* 09/23/2011      Assessment & Plan:  Aortic stenosis, with persistent murmur BPH, stable DM.   well-controlled Dyslipidemia: well-controlled.    Subjective:   Patient here for Medicare annual wellness visit and management of other chronic and acute problems.     Risk factors: advanced age    Roster of Physicians Providing Medical Care to Patient:  See "snapshot"   Activities of Daily Living: In your present state of health, do you have any difficulty performing the following activities?:  Preparing food and eating?: No  Bathing yourself: No  Getting dressed: No  Using the toilet: No  Moving around from place to place: No  In the past year have you fallen or had a near fall?: No    Home Safety: Has smoke detector and wears seat belts. No firearms. No excess sun exposure.  Diet and Exercise  Current exercise habits: pt says good Dietary issues discussed: pt reports a healthy diet   Depression Screen  Q1: Over the past two weeks, have you felt down, depressed or hopeless?no  Q2: Over the past two weeks, have you felt little interest or pleasure in doing things? no   The following portions of the patient's history were reviewed and updated as appropriate: allergies, current medications, past family history, past medical history, past social history, past surgical history and problem list.  Past Medical History  Diagnosis Date  . HYPERCHOLESTEROLEMIA 12/28/2007  . HYPERTENSION 02/07/2007  . GERD 02/07/2007  . DIVERTICULOSIS, COLON 02/07/2007  . COLONIC POLYPS, HX OF 02/07/2007  . HEART MURMUR, SYSTOLIC 03/27/2009  . BENIGN PROSTATIC HYPERTROPHY 02/07/2007  . DM 03/26/2010    Past Surgical History  Procedure Date  . Inguinal hernia repair     Right    History   Social History  . Marital Status: Married    Spouse Name: N/A    Number of Children: N/A  . Years of Education: N/A   Occupational History  .      Retired   Social History Main Topics  . Smoking status: Former Games developer  . Smokeless tobacco: Not on file  . Alcohol Use: Not on file  . Drug Use: Not on  file  . Sexually Active: Not on file   Other Topics Concern  . Not on file   Social History Narrative   Pt says his diet is excellent    Current Outpatient Prescriptions on File Prior to Visit  Medication Sig Dispense Refill  . amLODipine (NORVASC) 5 MG tablet Take 1 tablet (5  mg total) by mouth daily.  90 tablet  3  . aspirin 81 MG tablet Take 81 mg by mouth daily.        . hydrochlorothiazide (HYDRODIURIL) 25 MG tablet Take 1 tablet (25 mg total) by mouth daily.  90 tablet  3  . losartan (COZAAR) 100 MG tablet Take 1 tablet (100 mg total) by mouth daily.  90 tablet  3  . metFORMIN (GLUCOPHAGE-XR) 500 MG 24 hr tablet Take 1 tablet (500 mg total) by mouth daily with breakfast.  90 tablet  3  . simvastatin (ZOCOR) 20 MG tablet Take 1 tablet (20 mg total) by mouth daily.  90 tablet  3    Allergies  Allergen Reactions  . Lisinopril     REACTION: Rash    Family History  Problem Relation Age of Onset  . Cancer Neg Hx     BP 148/86  Pulse 77  Temp(Src) 97.6 F (36.4 C) (Oral)  Ht 5\' 10"  (1.778 m)  Wt 170 lb (77.111 kg)  BMI 24.39 kg/m2  SpO2 95%   Review of Systems  Denies hearing loss, and visual loss Objective:   Vision:  Sees optometrist Hearing: grossly normal Body mass index:  See vs page Msk: pt easily and quickly performs "get-up-and-go" from a sitting position. Cognitive Impairment Assessment: cognition, memory and judgment appear normal.  remembers 3/3 at 5 minutes.  excellent recall.  can easily read and write a sentence.  alert and oriented x 3   Assessment:   Medicare wellness utd on preventive parameters    Plan:   During the course of the visit the patient was educated and counseled about appropriate screening and preventive services including:       Fall prevention   Diabetes screening  Nutrition counseling   Vaccines / LABS Zostavax / Pnemonccoal Vaccine  today  PSA  Patient Instructions (the written plan) was given to the patient.

## 2011-09-23 NOTE — Telephone Encounter (Signed)
Called pt to inform of lab results, pt informed (letter also mailed to pt). 

## 2011-10-02 ENCOUNTER — Ambulatory Visit (HOSPITAL_COMMUNITY): Payer: Medicare Other | Attending: Cardiology

## 2011-10-02 ENCOUNTER — Other Ambulatory Visit: Payer: Self-pay

## 2011-10-02 DIAGNOSIS — E119 Type 2 diabetes mellitus without complications: Secondary | ICD-10-CM | POA: Insufficient documentation

## 2011-10-02 DIAGNOSIS — E785 Hyperlipidemia, unspecified: Secondary | ICD-10-CM | POA: Diagnosis not present

## 2011-10-02 DIAGNOSIS — I350 Nonrheumatic aortic (valve) stenosis: Secondary | ICD-10-CM

## 2011-10-02 DIAGNOSIS — I359 Nonrheumatic aortic valve disorder, unspecified: Secondary | ICD-10-CM

## 2011-10-03 ENCOUNTER — Other Ambulatory Visit: Payer: Self-pay | Admitting: Endocrinology

## 2011-10-03 ENCOUNTER — Telehealth: Payer: Self-pay | Admitting: *Deleted

## 2011-10-03 ENCOUNTER — Encounter: Payer: Self-pay | Admitting: Endocrinology

## 2011-10-03 DIAGNOSIS — I350 Nonrheumatic aortic (valve) stenosis: Secondary | ICD-10-CM

## 2011-10-03 NOTE — Telephone Encounter (Signed)
Called pt to inform of results of Echocardiogram, pt informed (letter also mailed to pt).

## 2011-10-30 ENCOUNTER — Encounter: Payer: Self-pay | Admitting: Cardiovascular Disease

## 2011-10-30 ENCOUNTER — Ambulatory Visit (INDEPENDENT_AMBULATORY_CARE_PROVIDER_SITE_OTHER): Payer: Medicare Other | Admitting: Cardiovascular Disease

## 2011-10-30 VITALS — BP 146/80 | HR 80 | Ht 70.0 in | Wt 171.4 lb

## 2011-10-30 DIAGNOSIS — I10 Essential (primary) hypertension: Secondary | ICD-10-CM

## 2011-10-30 DIAGNOSIS — I359 Nonrheumatic aortic valve disorder, unspecified: Secondary | ICD-10-CM | POA: Diagnosis not present

## 2011-10-30 DIAGNOSIS — E7800 Pure hypercholesterolemia, unspecified: Secondary | ICD-10-CM

## 2011-10-30 DIAGNOSIS — I350 Nonrheumatic aortic (valve) stenosis: Secondary | ICD-10-CM

## 2011-10-30 HISTORY — DX: Nonrheumatic aortic (valve) stenosis: I35.0

## 2011-10-30 NOTE — Patient Instructions (Addendum)
Your physician wants you to follow-up in:  12 months.  You will receive a reminder letter in the mail two months in advance. If you don't receive a letter, please call our office to schedule the follow-up appointment.   

## 2011-10-30 NOTE — Assessment & Plan Note (Signed)
Lab Results  Component Value Date   CHOL 136 09/23/2011   HDL 39.00* 09/23/2011   LDLCALC 76 09/23/2011   LDLDIRECT 110.2 12/28/2007   TRIG 104.0 09/23/2011   CHOLHDL 3 09/23/2011   Continue treatment with simvastatin.

## 2011-10-30 NOTE — Progress Notes (Signed)
HPI  This is an 78 year old gentleman who was referred by Dr. Haynes Kerns for evaluation of aortic stenosis. The patient has known about a cardiac murmur for few years. He had an echocardiogram done in may of 2012 which showed mild aortic stenosis with a mean gradient of 13 mm mercury. He had a repeat echocardiogram in may of this year which showed progression of her aortic stenosis with a mean gradient of 21 mm mercury and peak gradient of 40 consistent with moderate aortic stenosis. LV systolic function was normal. The patient is not aware of any other previous cardiac history. He has no previous history of ischemic heart disease or stroke. He has borderline diabetes, hypertension and hyperlipidemia. All these riskt factors well-controlled with medical therapy. [name] is very active and reports no physical limitations. He walks in a regular basis and does all his yard work. He denies any chest pain, dyspnea or dizziness. He has no previous history of syncope or presyncope. There is no orthopnea, PND or lower extremity edema.  Allergies  Allergen Reactions  . Lisinopril     REACTION: Rash     Current Outpatient Prescriptions on File Prior to Visit  Medication Sig Dispense Refill  . amLODipine (NORVASC) 5 MG tablet Take 1 tablet (5 mg total) by mouth daily.  90 tablet  3  . aspirin 81 MG tablet Take 81 mg by mouth daily.        . hydrochlorothiazide (HYDRODIURIL) 25 MG tablet Take 1 tablet (25 mg total) by mouth daily.  90 tablet  3  . losartan (COZAAR) 100 MG tablet Take 1 tablet (100 mg total) by mouth daily.  90 tablet  3  . metFORMIN (GLUCOPHAGE-XR) 500 MG 24 hr tablet Take 1 tablet (500 mg total) by mouth daily with breakfast.  90 tablet  3  . simvastatin (ZOCOR) 20 MG tablet Take 1 tablet (20 mg total) by mouth daily.  90 tablet  3     Past Medical History  Diagnosis Date  . HYPERCHOLESTEROLEMIA 12/28/2007  . HYPERTENSION 02/07/2007  . GERD 02/07/2007  . DIVERTICULOSIS, COLON  02/07/2007  . COLONIC POLYPS, HX OF 02/07/2007  . HEART MURMUR, SYSTOLIC 03/27/2009  . BENIGN PROSTATIC HYPERTROPHY 02/07/2007  . DM 03/26/2010     Past Surgical History  Procedure Date  . Inguinal hernia repair     Right     Family History  Problem Relation Age of Onset  . Cancer Neg Hx      History   Social History  . Marital Status: Married    Spouse Name: N/A    Number of Children: N/A  . Years of Education: N/A   Occupational History  .      Retired   Social History Main Topics  . Smoking status: Former Smoker -- 0.5 packs/day for 35 years    Types: Cigarettes    Quit date: 10/30/1966  . Smokeless tobacco: Not on file  . Alcohol Use: No  . Drug Use: Not on file  . Sexually Active: Not on file   Other Topics Concern  . Not on file   Social History Narrative   Pt says his diet is excellent     ROS Constitutional: Negative for fever, chills, diaphoresis, activity change, appetite change and fatigue.  HENT: Negative for hearing loss, nosebleeds, congestion, sore throat, facial swelling, drooling, trouble swallowing, neck pain, voice change, sinus pressure and tinnitus.  Eyes: Negative for photophobia, pain, discharge and visual disturbance.  Respiratory:  Negative for apnea, cough, chest tightness, shortness of breath and wheezing.  Cardiovascular: Negative for chest pain, palpitations and leg swelling.  Gastrointestinal: Negative for nausea, vomiting, abdominal pain, diarrhea, constipation, blood in stool and abdominal distention.  Genitourinary: Negative for dysuria, urgency, frequency, hematuria and decreased urine volume.  Musculoskeletal: Negative for myalgias, back pain, joint swelling, arthralgias and gait problem.  Skin: Negative for color change, pallor, rash and wound.  Neurological: Negative for dizziness, tremors, seizures, syncope, speech difficulty, weakness, light-headedness, numbness and headaches.  Psychiatric/Behavioral: Negative for suicidal  ideas, hallucinations, behavioral problems and agitation. The patient is not nervous/anxious.     PHYSICAL EXAM   BP 146/80  Pulse 80  Ht 5\' 10"  (1.778 m)  Wt 77.747 kg (171 lb 6.4 oz)  BMI 24.59 kg/m2 Constitutional: He is oriented to person, place, and time. He appears well-developed and well-nourished. No distress.  HENT: No nasal discharge.  Head: Normocephalic and atraumatic.  Eyes: Pupils are equal and round. Right eye exhibits no discharge. Left eye exhibits no discharge.  Neck: Normal range of motion. Neck supple. No JVD present. No thyromegaly present.  Cardiovascular: Normal rate, regular rhythm, normal heart sounds. Exam reveals no gallop and no friction rub. There is a 3/6 systolic ejection murmur at the aortic area which radiates to both carotid arteries. The murmur is mid peaking. S2 is well preserved. Pulses are normal. Pulmonary/Chest: Effort normal and breath sounds normal. No stridor. No respiratory distress. He has no wheezes. He has no rales. He exhibits no tenderness.  Abdominal: Soft. Bowel sounds are normal. He exhibits no distension. There is no tenderness. There is no rebound and no guarding.  Musculoskeletal: Normal range of motion. He exhibits no edema and no tenderness.  Neurological: He is alert and oriented to person, place, and time. Coordination normal.  Skin: Skin is warm and dry. No rash noted. He is not diaphoretic. No erythema. No pallor.  Psychiatric: He has a normal mood and affect. His behavior is normal. Judgment and thought content normal.       His ECG was reviewed and showed Sinus  Rhythm  -Left axis -anterior fascicular block.   -Old anteroseptal infarct.    ASSESSMENT AND PLAN

## 2011-10-30 NOTE — Assessment & Plan Note (Signed)
His blood pressure is well controlled. Continue current medications. 

## 2011-10-30 NOTE — Assessment & Plan Note (Signed)
The patient has aortic stenosis which has progressed from mild to moderate over one year based on mean gradient of 13 which increased to 21 mm mercury. LV systolic function is normal. The patient is completely asymptomatic. He has no symptoms suggestive of angina, heart failure or syncope. I discussed with the patient and his family the natural history of aortic stenosis. Most likely has aortic stenosis would progress to severe range within the next 5 years. He is currently on optimal medical therapy. He also takes low-dose aspirin once daily. He has no symptoms suggestive of obstructive coronary artery disease. I recommend a followup echocardiogram in one year or early or if he develops any of the above symptoms. I will have him come back to see [place] in one year.

## 2011-12-01 DIAGNOSIS — H6120 Impacted cerumen, unspecified ear: Secondary | ICD-10-CM | POA: Diagnosis not present

## 2011-12-19 ENCOUNTER — Ambulatory Visit (INDEPENDENT_AMBULATORY_CARE_PROVIDER_SITE_OTHER): Payer: Medicare Other | Admitting: Internal Medicine

## 2011-12-19 ENCOUNTER — Encounter: Payer: Self-pay | Admitting: Internal Medicine

## 2011-12-19 VITALS — BP 136/74 | HR 62 | Ht 70.0 in | Wt 170.0 lb

## 2011-12-19 DIAGNOSIS — Z86010 Personal history of colonic polyps: Secondary | ICD-10-CM | POA: Diagnosis not present

## 2011-12-19 DIAGNOSIS — E119 Type 2 diabetes mellitus without complications: Secondary | ICD-10-CM | POA: Diagnosis not present

## 2011-12-19 MED ORDER — MOVIPREP 100 G PO SOLR: 1.0000 | Freq: Once | ORAL | Status: DC

## 2011-12-19 NOTE — Patient Instructions (Addendum)
You have been scheduled for a colonoscopy with propofol. Please follow written instructions given to you at your visit today.  Please pick up your prep kit at the pharmacy within the next 1-3 days. If you use inhalers (even only as needed), please bring them with you on the day of your procedure.  

## 2011-12-20 ENCOUNTER — Encounter: Payer: Self-pay | Admitting: Internal Medicine

## 2011-12-20 NOTE — Progress Notes (Signed)
HISTORY OF PRESENT ILLNESS:  Adam Briggs is a 78 y.o. male with hypertension, diabetes mellitus,dyslipidemia, and prostate cancer status post seed implantation. He is sent today by his primary provider regarding followup colonoscopy. The patient initially underwent routine screening colonoscopy in February 2003. At that time he was found to have a diminutive rectal polyp which was removed and found to be hyperplastic. He has not been seen since. He does have a history of small bowel obstruction. His GI review of systems is entirely negative. He is quite active. His chronic medical problems are Briggs Under good control. Recent evaluation with his primary provider reviewed. As well, laboratories from May of 2013 reviewed. Normal hemoglobin 14.1. Also has remote history of upper endoscopy for GERD. This was normal. No longer having GERD symptoms or requiring medication.  REVIEW OF SYSTEMS:  Briggs non-GI ROS negative except for sinus trouble, heart murmur  Past Medical History  Diagnosis Date  . HYPERCHOLESTEROLEMIA 12/28/2007  . HYPERTENSION 02/07/2007  . GERD 02/07/2007  . DIVERTICULOSIS, COLON 02/07/2007  . COLONIC POLYPS, HX OF 02/07/2007  . HEART MURMUR, SYSTOLIC 03/27/2009  . BENIGN PROSTATIC HYPERTROPHY 02/07/2007    Has seed implant   . DM 03/26/2010    Past Surgical History  Procedure Date  . Inguinal hernia repair     Right    Social History Adam Briggs  reports that he quit smoking about 45 years ago. His smoking use included Cigarettes. He has a 17.5 pack-year smoking history. He has never used smokeless tobacco. He reports that he does not drink alcohol or use illicit drugs.  family history is negative for Colon cancer.  Allergies  Allergen Reactions  . Lisinopril     REACTION: Rash       PHYSICAL EXAMINATION: Vital signs: BP 136/74  Pulse 62  Ht 5\' 10"  (1.778 m)  Wt 170 lb (77.111 kg)  BMI 24.39 kg/m2  Constitutional: generally well-appearing, no acute  distress Psychiatric: alert and oriented x3, cooperative Eyes: extraocular movements intact, anicteric, conjunctiva pink Mouth: oral pharynx moist, no lesions Neck: supple no lymphadenopathy Cardiovascular: heart regular rate and rhythm, systolic murmur present Lungs: clear to auscultation bilaterally Abdomen: soft, nontender, nondistended, no obvious ascites, no peritoneal signs, normal bowel sounds, no organomegaly Rectal:deferred until colonoscopy Extremities: no lower extremity edema bilaterally Skin: no lesions on visible extremities Neuro: No focal deficits. No asterixis.     ASSESSMENT:  #1. Personal history of colon polyp. Hyperplastic. Returns today regarding followup surveillance/Screening colonoscopy. Patient is in excellent shape and an appropriate candidate without contraindication. He is interested in proceeding.The nature of the procedure, as well as the risks, benefits, and alternatives were carefully and thoroughly reviewed with the patient. Ample time for discussion and questions allowed. The patient understood, was satisfied, and agreed to proceed.  #2. Movi prep prescribed. The patient instructed on its use #3. Hold diabetic medications the day of the procedure until normal oral intake resumed. We will monitor his blood sugar medially before and after his examination #4. Ongoing general medical care with Dr. Everardo Briggs

## 2012-01-16 ENCOUNTER — Emergency Department (HOSPITAL_COMMUNITY)
Admission: EM | Admit: 2012-01-16 | Discharge: 2012-01-16 | Disposition: A | Discharge status: Home / Self Care | Payer: Medicare Other | Attending: Emergency Medicine | Admitting: Emergency Medicine

## 2012-01-16 ENCOUNTER — Emergency Department (HOSPITAL_COMMUNITY): Payer: Medicare Other

## 2012-01-16 ENCOUNTER — Encounter (HOSPITAL_COMMUNITY): Payer: Self-pay

## 2012-01-16 DIAGNOSIS — I10 Essential (primary) hypertension: Secondary | ICD-10-CM | POA: Insufficient documentation

## 2012-01-16 DIAGNOSIS — Z87891 Personal history of nicotine dependence: Secondary | ICD-10-CM | POA: Diagnosis not present

## 2012-01-16 DIAGNOSIS — E119 Type 2 diabetes mellitus without complications: Secondary | ICD-10-CM | POA: Diagnosis not present

## 2012-01-16 DIAGNOSIS — N400 Enlarged prostate without lower urinary tract symptoms: Secondary | ICD-10-CM | POA: Diagnosis not present

## 2012-01-16 DIAGNOSIS — R42 Dizziness and giddiness: Secondary | ICD-10-CM | POA: Insufficient documentation

## 2012-01-16 DIAGNOSIS — K219 Gastro-esophageal reflux disease without esophagitis: Secondary | ICD-10-CM | POA: Diagnosis not present

## 2012-01-16 DIAGNOSIS — R079 Chest pain, unspecified: Secondary | ICD-10-CM | POA: Diagnosis not present

## 2012-01-16 DIAGNOSIS — E7800 Pure hypercholesterolemia, unspecified: Secondary | ICD-10-CM | POA: Diagnosis not present

## 2012-01-16 HISTORY — DX: Unspecified abdominal hernia without obstruction or gangrene: K46.9

## 2012-01-16 LAB — CBC WITH DIFFERENTIAL/PLATELET
Eosinophils Relative: 2 % (ref 0–5)
HCT: 41.1 % (ref 39.0–52.0)
Lymphocytes Relative: 16 % (ref 12–46)
Lymphs Abs: 0.9 10*3/uL (ref 0.7–4.0)
MCV: 85.4 fL (ref 78.0–100.0)
Monocytes Absolute: 0.5 10*3/uL (ref 0.1–1.0)
RBC: 4.81 MIL/uL (ref 4.22–5.81)
WBC: 5.8 10*3/uL (ref 4.0–10.5)

## 2012-01-16 LAB — URINALYSIS, ROUTINE W REFLEX MICROSCOPIC
Glucose, UA: NEGATIVE mg/dL
Hgb urine dipstick: NEGATIVE
Specific Gravity, Urine: 1.013 (ref 1.005–1.030)

## 2012-01-16 LAB — BASIC METABOLIC PANEL
CO2: 26 mEq/L (ref 19–32)
Calcium: 9.5 mg/dL (ref 8.4–10.5)
Creatinine, Ser: 0.94 mg/dL (ref 0.50–1.35)
Glucose, Bld: 108 mg/dL — ABNORMAL HIGH (ref 70–99)

## 2012-01-16 NOTE — ED Provider Notes (Signed)
History     CSN: [id number]  Arrival date & time 01/16/12  6213   First MD Initiated Contact with Patient 01/16/12 [phone]      Chief Complaint  Patient presents with  . Dizziness    (Consider location/radiation/quality/duration/timing/severity/associated sxs/prior treatment) HPI Comments: Adam Briggs is a 78 y.o. Male who is a dizziness intermittently for one week. It comes on without provocation. It makes him feel like he can't walk straight. He also has a spinning sensation when he moves his head, sometimes. He has no headache, nausea, vomiting, sinus congestion, chest pain, shortness of breath, fever, or chills. He, ambulatory by private vehicle to be evaluated today. She's not had this previously. He's using his usual medications without relief. There are no other aggravating or palliative factors.  The history is provided by the patient.    Past Medical History  Diagnosis Date  . HYPERCHOLESTEROLEMIA 12/28/2007  . HYPERTENSION 02/07/2007  . GERD 02/07/2007  . DIVERTICULOSIS, COLON 02/07/2007  . COLONIC POLYPS, HX OF 02/07/2007  . HEART MURMUR, SYSTOLIC 03/27/2009  . BENIGN PROSTATIC HYPERTROPHY 02/07/2007    Has seed implant   . DM 03/26/2010  . Cancer   . Hernia   . Aortic stenosis     Past Surgical History  Procedure Date  . Inguinal hernia repair     Right  . Radioactive seed implant     Family History  Problem Relation Age of Onset  . Colon cancer Neg Hx     History  Substance Use Topics  . Smoking status: Former Smoker -- 0.5 packs/day for 35 years    Types: Cigarettes    Quit date: 10/30/1966  . Smokeless tobacco: Never Used  . Alcohol Use: No      Review of Systems  All other systems reviewed and are negative.    Allergies  Lisinopril  Home Medications   Current Outpatient Rx  Name Route Sig Dispense Refill  . AMLODIPINE BESYLATE 5 MG PO TABS Oral Take 5 mg by mouth daily.    . ASPIRIN EC 81 MG PO TBEC Oral Take 81 mg by mouth daily.    Marland Kitchen  HYDROCHLOROTHIAZIDE 25 MG PO TABS Oral Take 25 mg by mouth daily.    Marland Kitchen LOSARTAN POTASSIUM 100 MG PO TABS Oral Take 100 mg by mouth daily.    Marland Kitchen METFORMIN HCL ER 500 MG PO TB24 Oral Take 500 mg by mouth daily with breakfast.    . MOVIPREP 100 G PO SOLR Oral Take 1 kit (100 g total) by mouth once. 1 kit 0    Dispense as written.  Marland Kitchen SIMVASTATIN 20 MG PO TABS Oral Take 20 mg by mouth every evening.      BP 158/68  Pulse 75  Temp 97.8 F (36.6 C) (Oral)  Resp 16  SpO2 98%  Physical Exam  Nursing note and vitals reviewed. Constitutional: He is oriented to person, place, and time. He appears well-developed and well-nourished.  HENT:  Head: Normocephalic and atraumatic.  Right Ear: External ear normal.  Left Ear: External ear normal.  Eyes: Conjunctivae and EOM are normal. Pupils are equal, round, and reactive to light.  Neck: Normal range of motion and phonation normal. Neck supple.  Cardiovascular: Normal rate, regular rhythm, normal heart sounds and intact distal pulses.   Pulmonary/Chest: Effort normal and breath sounds normal. He exhibits no bony tenderness.  Abdominal: Soft. Normal appearance. There is no tenderness.  Musculoskeletal: Normal range of motion.  Neurological: He is alert  and oriented to person, place, and time. He has normal strength. No cranial nerve deficit or sensory deficit. He exhibits normal muscle tone. Coordination normal.       No Nystagmus. Normal gait.  Skin: Skin is warm, dry and intact.  Psychiatric: He has a normal mood and affect. His behavior is normal. Judgment and thought content normal.    ED Course  Procedures (including critical care time)  Orthostatics, negative   Date: 01/16/2012  Rate: 75  Rhythm: normal sinus rhythm  QRS Axis: normal  Intervals: normal  ST/T Wave abnormalities: normal  Conduction Disutrbances:left anterior fascicular block  Narrative Interpretation:   Old EKG Reviewed: unchanged     Labs Reviewed  BASIC  METABOLIC PANEL - Abnormal; Notable for the following:    Glucose, Bld 108 (*)     GFR calc non Af Amer 77 (*)     GFR calc Af Amer 89 (*)     All other components within normal limits  URINALYSIS, ROUTINE W REFLEX MICROSCOPIC  CBC WITH DIFFERENTIAL  TROPONIN I   Dg Chest 2 View  01/16/2012  *RADIOLOGY REPORT*  Clinical Data: Chest pain, dizziness  CHEST - 2 VIEW  Comparison: Chest radiograph 06/24/2007  Findings: Normal mediastinum and cardiac silhouette.  Costophrenic angles are clear.  No effusion, infiltrate, or pneumothorax. Degenerative osteophytosis of the thoracic spine.  IMPRESSION: No acute cardiopulmonary process.   Original Report Authenticated By: Genevive Bi, M.D.      1. Dizziness       MDM  Nonspecific dizziness, transient. Doubt complication from aortic stenosis, TIA, CVA, ACS, or metabolic instability.    Plan: Home Medications- usual; Home Treatments- rest; Recommended follow up- PCP prn        Flint Melter, MD 01/16/12 1145

## 2012-01-16 NOTE — ED Notes (Signed)
Patient reports that he has been feeling dizzy for the past week. Patient denies nausea or blurred vision. 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

## 2012-01-21 ENCOUNTER — Encounter: Payer: Self-pay | Admitting: Internal Medicine

## 2012-01-21 ENCOUNTER — Ambulatory Visit (AMBULATORY_SURGERY_CENTER): Payer: Medicare Other | Admitting: Internal Medicine

## 2012-01-21 VITALS — BP 155/82 | HR 70 | Temp 97.9°F | Resp 20 | Ht 70.0 in | Wt 170.0 lb

## 2012-01-21 DIAGNOSIS — Z1211 Encounter for screening for malignant neoplasm of colon: Secondary | ICD-10-CM

## 2012-01-21 DIAGNOSIS — D126 Benign neoplasm of colon, unspecified: Secondary | ICD-10-CM | POA: Diagnosis not present

## 2012-01-21 DIAGNOSIS — Z86010 Personal history of colonic polyps: Secondary | ICD-10-CM

## 2012-01-21 DIAGNOSIS — E119 Type 2 diabetes mellitus without complications: Secondary | ICD-10-CM | POA: Diagnosis not present

## 2012-01-21 DIAGNOSIS — I10 Essential (primary) hypertension: Secondary | ICD-10-CM | POA: Diagnosis not present

## 2012-01-21 LAB — GLUCOSE, CAPILLARY: Glucose-Capillary: 86 mg/dL (ref 70–99)

## 2012-01-21 MED ORDER — SODIUM CHLORIDE 0.9 % IV SOLN: 500.0000 mL | INTRAVENOUS | Status: DC

## 2012-01-21 NOTE — Progress Notes (Signed)
Patient did not have preoperative order for IV antibiotic SSI prophylaxis. (G8918)  Patient did not experience any of the following events: a burn prior to discharge; a fall within the facility; wrong site/side/patient/procedure/implant event; or a hospital transfer or hospital admission upon discharge from the facility. (G8907)  

## 2012-01-21 NOTE — Patient Instructions (Addendum)
YOU HAD AN ENDOSCOPIC PROCEDURE TODAY AT THE [facility]: Refer to the procedure report that was given to you for any specific questions about what was found during the examination.  If the procedure report does not answer your questions, please call your gastroenterologist to clarify.  If you requested that your care partner not be given the details of your procedure findings, then the procedure report has been included in a sealed envelope for you to review at your convenience later.  YOU SHOULD EXPECT: Some feelings of bloating in the abdomen. Passage of more gas than usual.  Walking can help get rid of the air that was put into your GI tract during the procedure and reduce the bloating. If you had a lower endoscopy (such as a colonoscopy or flexible sigmoidoscopy) you may notice spotting of blood in your stool or on the toilet paper. If you underwent a bowel prep for your procedure, then you may not have a normal bowel movement for a few days.  DIET: Your first meal following the procedure should be a light meal and then it is ok to progress to your normal diet.  A half-sandwich or bowl of soup is an example of a good first meal.  Heavy or fried foods are harder to digest and may make you feel nauseous or bloated.  Likewise meals heavy in dairy and vegetables can cause extra gas to form and this can also increase the bloating.  Drink plenty of fluids but you should avoid alcoholic beverages for 24 hours.  ACTIVITY: Your care partner should take you home directly after the procedure.  You should plan to take it easy, moving slowly for the rest of the day.  You can resume normal activity the day after the procedure however you should NOT DRIVE or use heavy machinery for 24 hours (because of the sedation medicines used during the test).    SYMPTOMS TO REPORT IMMEDIATELY: A gastroenterologist can be reached at any hour.  During normal business hours, 8:30 AM to 5:00 PM Monday through Friday,  call [phone].  After hours and on weekends, please call the GI answering service at [phone] who will take a message and have the physician on call contact you.   Following lower endoscopy (colonoscopy or flexible sigmoidoscopy):  Excessive amounts of blood in the stool  Significant tenderness or worsening of abdominal pains  Swelling of the abdomen that is new, acute  Fever of 100F or higher  FOLLOW UP: If any biopsies were taken you will be contacted by phone or by letter within the next 1-3 weeks.  Call your gastroenterologist if you have not heard about the biopsies in 3 weeks.  Our staff will call the home number listed on your records the next business day following your procedure to check on you and address any questions or concerns that you may have at that time regarding the information given to you following your procedure. This is a courtesy call and so if there is no answer at the home number and we have not heard from you through the emergency physician on call, we will assume that you have returned to your regular daily activities without incident.  SIGNATURES/CONFIDENTIALITY: You and/or your care partner have signed paperwork which will be entered into your electronic medical record.  These signatures attest to the fact that that the information above on your After Visit Summary has been reviewed and is understood.  Full responsibility of the confidentiality of this   discharge information lies with you and/or your care-partner.   Thank-you for choosing us for your healthcare needs. 

## 2012-01-21 NOTE — Op Note (Signed)
[facility] [address] [place], [address]   COLONOSCOPY PROCEDURE REPORT  PATIENT: Adam Briggs, Adam Briggs  MR#: [id number] BIRTHDATE: 11-17-1929 , 82  yrs. old GENDER: Male ENDOSCOPIST: Roxy Cedar, MD REFERRED ON:[id number] /  Recall PROCEDURE DATE:  01/21/2012 PROCEDURE:   Colonoscopy with snare polypectomy   x 2 ASA CLASS:   Class II INDICATIONS:high risk patient with personal history of colonic polyps.   HPP 2003 MEDICATIONS: MAC sedation, administered by CRNA and propofol (Diprivan) 160mg  IV  DESCRIPTION OF PROCEDURE:   After the risks benefits and alternatives of the procedure were thoroughly explained, informed consent was obtained.  A digital rectal exam revealed no abnormalities of the rectum.   The LB [id number]  endoscope was introduced through the anus and advanced to the cecum, which was identified by both the appendix and ileocecal valve. No adverse events experienced.   The quality of the prep was excellent, using MoviPrep  The instrument was then slowly withdrawn as the colon was fully examined.      COLON FINDINGS: A single polyp ranging between 5mm in size was found in the transverse colon.  A polypectomy was performed with a cold snare.  The resection was complete and the polyp tissue was completely retrieved.   A pedunculated polyp measuring 10 mm in size was found in the rectum.  A polypectomy was performed using snare cautery.  The resection was complete and the polyp tissue was completely retrieved.   Mild diverticulosis was noted in the sigmoid colon.   The colon mucosa was otherwise normal. Retroflexed views revealed internal hemorrhoids. The time to cecum=6 minutes 45 seconds.  Withdrawal time=13 minutes 35 seconds. The scope was withdrawn and the procedure completed. COMPLICATIONS: There were no complications.  ENDOSCOPIC IMPRESSION: 1.   Single polyp 5mm in size was found in the transverse colon; polypectomy was  performed with a cold snare 2.   Pedunculated polyp measuring 10 mm in size was found in the rectum; polypectomy was performed using snare cautery 3.   Mild diverticulosis was noted in the sigmoid colon 4.   The colon mucosa was otherwise normal  RECOMMENDATIONS: 1. Return to the care of your primary provider.  GI follow up as needed   eSigned:  Roxy Cedar, MD 01/21/2012 2:20 PM   cc: Minus Breeding, MD and The Patient   PATIENT NAME:  Adam Briggs, Adam Briggs MR#: [id number]

## 2012-01-22 ENCOUNTER — Telehealth: Payer: Self-pay | Admitting: *Deleted

## 2012-01-22 NOTE — Telephone Encounter (Signed)
  Follow up Call-  Call back number 01/21/2012  Post procedure Call Back phone  # [phone]  Permission to leave phone message Yes     Patient questions:  Do you have a fever, pain , or abdominal swelling? no Pain Score  0 *  Have you tolerated food without any problems? yes  Have you been able to return to your normal activities? yes  Do you have any questions about your discharge instructions: Diet   no Medications  no Follow up visit  no  Do you have questions or concerns about your Care? no  Actions: * If pain score is 4 or above: No action needed, pain <4.

## 2012-01-23 DIAGNOSIS — N320 Bladder-neck obstruction: Secondary | ICD-10-CM | POA: Diagnosis not present

## 2012-01-23 DIAGNOSIS — Z8546 Personal history of malignant neoplasm of prostate: Secondary | ICD-10-CM | POA: Diagnosis not present

## 2012-01-23 DIAGNOSIS — N318 Other neuromuscular dysfunction of bladder: Secondary | ICD-10-CM | POA: Diagnosis not present

## 2012-01-27 ENCOUNTER — Encounter: Payer: Self-pay | Admitting: Internal Medicine

## 2012-01-29 DIAGNOSIS — N318 Other neuromuscular dysfunction of bladder: Secondary | ICD-10-CM | POA: Diagnosis not present

## 2012-01-29 DIAGNOSIS — N320 Bladder-neck obstruction: Secondary | ICD-10-CM | POA: Diagnosis not present

## 2012-01-29 DIAGNOSIS — R972 Elevated prostate specific antigen [PSA]: Secondary | ICD-10-CM | POA: Diagnosis not present

## 2012-01-29 DIAGNOSIS — Z8546 Personal history of malignant neoplasm of prostate: Secondary | ICD-10-CM | POA: Diagnosis not present

## 2012-03-23 ENCOUNTER — Ambulatory Visit (INDEPENDENT_AMBULATORY_CARE_PROVIDER_SITE_OTHER): Payer: Medicare Other | Admitting: Endocrinology

## 2012-03-23 ENCOUNTER — Ambulatory Visit: Payer: Medicare Other | Admitting: Endocrinology

## 2012-03-23 ENCOUNTER — Encounter: Payer: Self-pay | Admitting: Endocrinology

## 2012-03-23 VITALS — BP 132/80 | HR 68 | Temp 98.1°F | Wt 168.0 lb

## 2012-03-23 DIAGNOSIS — Z23 Encounter for immunization: Secondary | ICD-10-CM

## 2012-03-23 DIAGNOSIS — E119 Type 2 diabetes mellitus without complications: Secondary | ICD-10-CM

## 2012-03-23 LAB — HEMOGLOBIN A1C
Hgb A1c MFr Bld: 5.8 % — ABNORMAL HIGH (ref <5.7)
Mean Plasma Glucose: 120 mg/dL — ABNORMAL HIGH (ref <117)

## 2012-03-23 NOTE — Patient Instructions (Addendum)
blood tests are being requested for you today.  You will be contacted with results. Please come back for a "medicare wellness" visit after 09/22/12.

## 2012-03-23 NOTE — Progress Notes (Signed)
  Subjective:    Patient ID: Adam Briggs, male    DOB: 12-22-1929, 78 y.o.   MRN: [id number]  HPI Pt returns for f/u type 2 DM (dx'ed 2011; no known complications).  pt states he feels well in general.  no cbg record, but states cbg's are well-controlled.   Past Medical History  Diagnosis Date  . HYPERCHOLESTEROLEMIA 12/28/2007  . HYPERTENSION 02/07/2007  . GERD 02/07/2007  . DIVERTICULOSIS, COLON 02/07/2007  . COLONIC POLYPS, HX OF 02/07/2007  . HEART MURMUR, SYSTOLIC 03/27/2009  . BENIGN PROSTATIC HYPERTROPHY 02/07/2007    Has seed implant   . DM 03/26/2010  . Hernia   . Aortic stenosis   . Cancer     prostate  . Cataract     Past Surgical History  Procedure Date  . Inguinal hernia repair     Right  . Radioactive seed implant     History   Social History  . Marital Status: Married    Spouse Name: N/A    Number of Children: 2  . Years of Education: N/A   Occupational History  . Retired      Retired   Social History Main Topics  . Smoking status: Former Smoker -- 0.5 packs/day for 35 years    Types: Cigarettes    Quit date: 10/30/1966  . Smokeless tobacco: Never Used  . Alcohol Use: No  . Drug Use: No  . Sexually Active: Not on file   Other Topics Concern  . Not on file   Social History Narrative   Pt says his diet is excellentDaily caffeine     Current Outpatient Prescriptions on File Prior to Visit  Medication Sig Dispense Refill  . amLODipine (NORVASC) 5 MG tablet Take 5 mg by mouth daily.      Marland Kitchen aspirin EC 81 MG tablet Take 81 mg by mouth daily.      . hydrochlorothiazide (HYDRODIURIL) 25 MG tablet Take 25 mg by mouth daily.      Marland Kitchen losartan (COZAAR) 100 MG tablet Take 100 mg by mouth daily.      . metFORMIN (GLUCOPHAGE-XR) 500 MG 24 hr tablet Take 500 mg by mouth daily with breakfast.      . simvastatin (ZOCOR) 20 MG tablet Take 20 mg by mouth every evening.        Allergies  Allergen Reactions  . Lisinopril     REACTION: Rash    Family History    Problem Relation Age of Onset  . Colon cancer Neg Hx    BP 132/80  Pulse 68  Temp 98.1 F (36.7 C) (Oral)  Wt 168 lb (76.204 kg)  SpO2 97%  Review of Systems Denies weight change    Objective:   Physical Exam VITAL SIGNS:  See vs page GENERAL: no distress Pulses: dorsalis pedis intact bilat.   Feet: no deformity.  no ulcer on the feet.  feet are of normal color and temp.  no edema.  There is bilateral onychomycosis Neuro: sensation is intact to touch on the feet     Assessment & Plan:  Type 2 DM, apparently well-controlled

## 2012-03-24 NOTE — Progress Notes (Signed)
Pt ADVISED OF RESULTS AND STATES AN UNDERSTANDING

## 2012-03-25 ENCOUNTER — Other Ambulatory Visit: Payer: Self-pay | Admitting: Endocrinology

## 2012-10-14 ENCOUNTER — Ambulatory Visit (INDEPENDENT_AMBULATORY_CARE_PROVIDER_SITE_OTHER): Payer: Medicare Other | Admitting: Internal Medicine

## 2012-10-14 ENCOUNTER — Other Ambulatory Visit (INDEPENDENT_AMBULATORY_CARE_PROVIDER_SITE_OTHER): Payer: Medicare Other

## 2012-10-14 ENCOUNTER — Encounter: Payer: Self-pay | Admitting: Internal Medicine

## 2012-10-14 VITALS — BP 132/80 | HR 72 | Temp 98.1°F | Ht 70.0 in | Wt 171.4 lb

## 2012-10-14 DIAGNOSIS — E7800 Pure hypercholesterolemia, unspecified: Secondary | ICD-10-CM

## 2012-10-14 DIAGNOSIS — I10 Essential (primary) hypertension: Secondary | ICD-10-CM

## 2012-10-14 DIAGNOSIS — Z0000 Encounter for general adult medical examination without abnormal findings: Secondary | ICD-10-CM

## 2012-10-14 DIAGNOSIS — E119 Type 2 diabetes mellitus without complications: Secondary | ICD-10-CM | POA: Diagnosis not present

## 2012-10-14 DIAGNOSIS — J329 Chronic sinusitis, unspecified: Secondary | ICD-10-CM

## 2012-10-14 DIAGNOSIS — Z23 Encounter for immunization: Secondary | ICD-10-CM | POA: Diagnosis not present

## 2012-10-14 LAB — CBC WITH DIFFERENTIAL/PLATELET
Basophils Relative: 0.6 % (ref 0.0–3.0)
Eosinophils Relative: 3.6 % (ref 0.0–5.0)
HCT: 42.6 % (ref 39.0–52.0)
Lymphs Abs: 1.1 10*3/uL (ref 0.7–4.0)
MCV: 86.2 fl (ref 78.0–100.0)
Monocytes Absolute: 0.4 10*3/uL (ref 0.1–1.0)
Monocytes Relative: 8.1 % (ref 3.0–12.0)
Platelets: 178 10*3/uL (ref 150.0–400.0)
RBC: 4.94 Mil/uL (ref 4.22–5.81)
WBC: 5.6 10*3/uL (ref 4.5–10.5)

## 2012-10-14 LAB — TSH: TSH: 2.85 u[IU]/mL (ref 0.35–5.50)

## 2012-10-14 LAB — LIPID PANEL
HDL: 35.7 mg/dL — ABNORMAL LOW (ref >39.00)
LDL Cholesterol: 87 mg/dL (ref 0–99)
VLDL: 22.2 mg/dL (ref 0.0–40.0)

## 2012-10-14 LAB — HEPATIC FUNCTION PANEL
ALT: 12 U/L (ref 0–53)
AST: 13 U/L (ref 0–37)
Bilirubin, Direct: 0.1 mg/dL (ref 0.0–0.3)
Total Bilirubin: 0.9 mg/dL (ref 0.3–1.2)

## 2012-10-14 LAB — BASIC METABOLIC PANEL
BUN: 18 mg/dL (ref 6–23)
Calcium: 9.4 mg/dL (ref 8.4–10.5)
GFR: 69.68 mL/min (ref >60.00)
Glucose, Bld: 103 mg/dL — ABNORMAL HIGH (ref 70–99)
Sodium: 138 mEq/L (ref 135–145)

## 2012-10-14 LAB — HEMOGLOBIN A1C: Hgb A1c MFr Bld: 6.2 % (ref 4.6–6.5)

## 2012-10-14 MED ORDER — FLUTICASONE PROPIONATE 50 MCG/ACT NA SUSP: 2.0000 | Freq: Every day | NASAL | Status: DC

## 2012-10-14 MED ORDER — LEVOFLOXACIN 500 MG PO TABS: 500.0000 mg | ORAL_TABLET | Freq: Every day | ORAL | Status: AC

## 2012-10-14 MED ORDER — FEXOFENADINE HCL 180 MG PO TABS: 180.0000 mg | ORAL_TABLET | Freq: Every day | ORAL | Status: DC

## 2012-10-14 NOTE — Patient Instructions (Signed)
It was good to see you today. We have reviewed your prior records including labs and tests today Health Maintenance reviewed - Tetanus booster updated today, good for 10 years -all other recommended immunizations and age-appropriate screenings are up-to-date. Test(s) ordered today. Your results will be released to MyChart (or called to you) after review, usually within 72hours after test completion. If any changes need to be made, you will be notified at that same time. Medications reviewed and updated, no changes recommended at this time. Take Levaquin antibiotics once daily for 7 days, use Flonase sinus spray every day for 30 days and Allegra over-the-counter once daily for allergy and sinus symptoms Your prescription(s) have been submitted to your pharmacy. Please take as directed and contact our office if you believe you are having problem(s) with the medication(s). Please schedule followup in 6 months, call sooner if problems.

## 2012-10-14 NOTE — Progress Notes (Signed)
Subjective:    Patient ID: Adam Briggs, male    DOB: Oct 21, 1929, 79 y.o.   MRN: [id number]  HPI  Patient to me but known to our practice, transfer primary care provider Here for medicare wellness  Diet: heart healthy or DM if diabetic Physical activity: sedentary Depression/mood screen: negative Hearing: intact to whispered voice Visual acuity: grossly normal, performs annual eye exam  ADLs: capable Fall risk: none Home safety: good Cognitive evaluation: intact to orientation, naming, recall and repetition EOL planning: adv directives, full code/ I agree  I have personally reviewed and have noted 1. The patient's medical and social history 2. Their use of alcohol, tobacco or illicit drugs 3. Their current medications and supplements 4. The patient's functional ability including ADL's, fall risks, home safety risks and hearing or visual impairment. 5. Diet and physical activities 6. Evidence for depression or mood disorders  Also reviewed chronic med issues -  Reports sinus congestion and nocturnal cough x 2 months, no fever  Past Medical History  Diagnosis Date  . HYPERCHOLESTEROLEMIA   . HYPERTENSION   . GERD   . DIVERTICULOSIS, COLON   . BENIGN PROSTATIC HYPERTROPHY   . DM   . Hernia   . Prostate cancer     s/p seed implant  . Cataract   . Moderate aortic stenosis 10/30/2011    echo 2013   Family History  Problem Relation Age of Onset  . Colon cancer Neg Hx    History  Substance Use Topics  . Smoking status: Former Smoker -- 0.50 packs/day for 35 years    Types: Cigarettes    Quit date: 10/30/1966  . Smokeless tobacco: Never Used  . Alcohol Use: No    Review of Systems Constitutional: Negative for fever or weight change.  Respiratory: positve for cough at night x 2 months, ?PND and sinus pressure - denies shortness of breath or sputum.   Cardiovascular: Negative for chest pain or palpitations. Gastrointestinal: Negative for abdominal pain, no bowel  changes.  Musculoskeletal: Negative for gait problem or joint swelling.  Skin: Negative for rash.  Neurological: Negative for dizziness or headache.  No other specific complaints in a complete review of systems (except as listed in HPI above).     Objective:   Physical Exam BP 142/82  Pulse 72  Temp(Src) 98.1 F (36.7 C) (Oral)  Ht 5\' 10"  (1.778 m)  Wt 171 lb 6.4 oz (77.747 kg)  BMI 24.59 kg/m2  SpO2 95% Wt Readings from Last 3 Encounters:  10/14/12 171 lb 6.4 oz (77.747 kg)  03/23/12 168 lb (76.204 kg)  01/21/12 170 lb (77.111 kg)   Constitutional: he appears well-developed and well-nourished. No distress. wife at side HENT: Head: Normocephalic and atraumatic. Sinus nontender Ears: B TMs ok, no erythema or effusion; Nose: Nose normal. Mouth/Throat: Oropharynx is clear and moist. No oropharyngeal exudate.  Eyes: Conjunctivae and EOM are normal. Pupils are equal, round, and reactive to light. No scleral icterus.  Neck: Normal range of motion. Neck supple. No JVD or LAD present. No thyromegaly present.  Cardiovascular: Normal rate, regular rhythm and normal heart sounds.  No murmur heard. No BLE edema. Pulmonary/Chest: Effort normal, but right side rhonchi - breath sounds normal on left. No respiratory distress. he has no wheezes.  Abdominal: Soft. Bowel sounds are normal. he exhibits no distension. There is no tenderness. no masses Musculoskeletal: Normal range of motion, no joint effusions. No gross deformities Neurological: he is alert and oriented to person, place, and  time. No cranial nerve deficit. Coordination, balance, strength, speech and gait are normal.  Skin: Skin is warm and dry. No rash noted. No erythema.  Psychiatric: he has a normal mood and affect. behavior is normal. Judgment and thought content normal.  Lab Results  Component Value Date   WBC 5.8 01/16/2012   HGB 14.0 01/16/2012   HCT 41.1 01/16/2012   PLT 238 01/16/2012   GLUCOSE 108* 01/16/2012   CHOL 136  09/23/2011   TRIG 104.0 09/23/2011   HDL 39.00* 09/23/2011   LDLDIRECT 110.2 12/28/2007   LDLCALC 76 09/23/2011   ALT 13 09/23/2011   AST 14 09/23/2011   NA 138 01/16/2012   K 4.0 01/16/2012   CL 103 01/16/2012   CREATININE 0.94 01/16/2012   BUN 20 01/16/2012   CO2 26 01/16/2012   TSH 2.18 09/23/2011   PSA 0.05* 09/23/2011   INR 0.9 08/10/2007   HGBA1C 5.8* 03/23/2012   MICROALBUR 16.8* 09/23/2011       Assessment & Plan:   AWV/CPX/v70.0 - Today patient counseled on age appropriate routine health concerns for screening and prevention, each reviewed and up to date or declined. Immunizations reviewed and up to date or declined. Labs/prior ECG reviewed. Risk factors for depression reviewed and negative. Hearing function and visual acuity are intact. ADLs screened and addressed as needed. Functional ability and level of safety reviewed and appropriate. Education, counseling and referrals performed based on assessed risks today. Patient provided with a copy of personalized plan for preventive services.  Chronic sinusitis with R side rhonchi - tx with levaquin 500 qd x 7d - also allegra 180 qd and flonase x 1 mo- follow up if unimproved, sooner if worse - pt understands and agrees  Also See problem list. Medications and labs reviewed today.

## 2012-10-14 NOTE — Assessment & Plan Note (Signed)
On simva for years - check annully The current medical regimen is effective;  continue present plan and medications.

## 2012-10-14 NOTE — Assessment & Plan Note (Signed)
On metformin solo tx Plans to follow here rather than endo Lab Results  Component Value Date   HGBA1C 5.8* 03/23/2012

## 2012-10-14 NOTE — Assessment & Plan Note (Signed)
BP Readings from Last 3 Encounters:  10/14/12 142/82  03/23/12 132/80  01/21/12 155/82   The current medical regimen is effective;  continue present plan and medications.

## 2012-10-27 ENCOUNTER — Ambulatory Visit (INDEPENDENT_AMBULATORY_CARE_PROVIDER_SITE_OTHER): Payer: Medicare Other | Admitting: Cardiology

## 2012-10-27 ENCOUNTER — Encounter: Payer: Self-pay | Admitting: Cardiology

## 2012-10-27 ENCOUNTER — Ambulatory Visit: Payer: Medicare Other | Admitting: Cardiovascular Disease

## 2012-10-27 VITALS — BP 140/90 | HR 72 | Ht 70.0 in | Wt 173.4 lb

## 2012-10-27 DIAGNOSIS — R011 Cardiac murmur, unspecified: Secondary | ICD-10-CM

## 2012-10-27 DIAGNOSIS — I359 Nonrheumatic aortic valve disorder, unspecified: Secondary | ICD-10-CM

## 2012-10-27 DIAGNOSIS — I350 Nonrheumatic aortic (valve) stenosis: Secondary | ICD-10-CM

## 2012-10-27 NOTE — Progress Notes (Signed)
HPI The patient presents for his first visit with me. He has a history of moderate aortic stenosis. This was followed up with an echo last year which did demonstrate that the gradient had increased somewhat over a 12 month period. However, the patient is quite asymptomatic. He is active. He denies any chest pressure, neck or arm discomfort. He has no shortness of breath, PND or orthopnea. He denies any palpitations, presyncope or syncope. He vigorously works on his property and tends to a 1 acre garden.  Allergies  Allergen Reactions  . Lisinopril     REACTION: Rash    Current Outpatient Prescriptions  Medication Sig Dispense Refill  . amLODipine (NORVASC) 5 MG tablet TAKE ONE TABLET BY MOUTH EVERY DAY  90 tablet  3  . aspirin EC 81 MG tablet Take 81 mg by mouth daily.      . fexofenadine (ALLEGRA) 180 MG tablet Take 1 tablet (180 mg total) by mouth daily.      . fluticasone (FLONASE) 50 MCG/ACT nasal spray Place 2 sprays into the nose daily.  16 g  2  . hydrochlorothiazide (HYDRODIURIL) 25 MG tablet TAKE ONE TABLET BY MOUTH EVERY DAY  90 tablet  3  . losartan (COZAAR) 100 MG tablet TAKE ONE TABLET BY MOUTH EVERY DAY  90 tablet  3  . metFORMIN (GLUCOPHAGE-XR) 500 MG 24 hr tablet TAKE ONE TABLET BY MOUTH ONCE DAILY WITH BREAKFAST  90 tablet  3  . simvastatin (ZOCOR) 20 MG tablet TAKE ONE TABLET BY MOUTH EVERY DAY  90 tablet  3   No current facility-administered medications for this visit.    Past Medical History  Diagnosis Date  . HYPERCHOLESTEROLEMIA   . HYPERTENSION   . GERD   . DIVERTICULOSIS, COLON   . BENIGN PROSTATIC HYPERTROPHY   . DM   . Hernia   . Prostate cancer     s/p seed implant  . Cataract   . Moderate aortic stenosis 10/30/2011    echo 2013    Past Surgical History  Procedure Laterality Date  . Inguinal hernia repair      Right  . Radioactive seed implant      ROS:  As stated in the HPI and negative for all other systems.  PHYSICAL EXAM BP 140/90   Pulse 72  Ht 5\' 10"  (1.778 m)  Wt 173 lb 6.4 oz (78.654 kg)  BMI 24.88 kg/m2 GENERAL:  Well appearing HEENT:  Pupils equal round and reactive, fundi not visualized, oral mucosa unremarkable NECK:  No jugular venous distention, waveform within normal limits, carotid upstroke brisk and symmetric, no bruits, no thyromegaly LYMPHATICS:  No cervical, inguinal adenopathy LUNGS:  Clear to auscultation bilaterally BACK:  No CVA tenderness CHEST:  Unremarkable HEART:  PMI not displaced or sustained,S1 and S2 within normal limits, no S3, no S4, no clicks, no rubs, apical systolic mid peaking murmur radiating out the aortic outflow tract, no diastolic murmurs ABD:  Flat, positive bowel sounds normal in frequency in pitch, no bruits, no rebound, no guarding, no midline pulsatile mass, no hepatomegaly, no splenomegaly EXT:  2 plus pulses throughout, no edema, no cyanosis no clubbing SKIN:  No rashes no nodules NEURO:  Cranial nerves II through XII grossly intact, motor grossly intact throughout PSYCH:  Cognitively intact, oriented to person place and time   EKG:  Sinus rhythm, rate 72, axis within normal limits, intervals within normal limits, no acute ST-T wave changes.   ASSESSMENT AND PLAN  AS:  The patient has no symptoms related to this and I doubt that it is significant at this time. However, given the fact that he did change in severity over the course of a year I would like to follow this with a repeat echo again this year.  HTN:  Blood pressure is well controlled. He will continue with the meds as listed.

## 2012-10-27 NOTE — Patient Instructions (Addendum)
The current medical regimen is effective;  continue present plan and medications.  Follow up in 1 year with Dr Hochrein.  You will receive a letter in the mail 2 months before you are due.  Please call us when you receive this letter to schedule your follow up appointment.  

## 2012-11-05 ENCOUNTER — Ambulatory Visit (HOSPITAL_COMMUNITY): Payer: Medicare Other | Attending: Cardiology | Admitting: Radiology

## 2012-11-05 ENCOUNTER — Other Ambulatory Visit: Payer: Self-pay

## 2012-11-05 DIAGNOSIS — E119 Type 2 diabetes mellitus without complications: Secondary | ICD-10-CM | POA: Diagnosis not present

## 2012-11-05 DIAGNOSIS — I359 Nonrheumatic aortic valve disorder, unspecified: Secondary | ICD-10-CM

## 2012-11-05 DIAGNOSIS — Z87891 Personal history of nicotine dependence: Secondary | ICD-10-CM | POA: Insufficient documentation

## 2012-11-05 DIAGNOSIS — I10 Essential (primary) hypertension: Secondary | ICD-10-CM | POA: Diagnosis not present

## 2012-11-05 DIAGNOSIS — E7800 Pure hypercholesterolemia, unspecified: Secondary | ICD-10-CM | POA: Insufficient documentation

## 2012-11-05 DIAGNOSIS — I350 Nonrheumatic aortic (valve) stenosis: Secondary | ICD-10-CM

## 2012-11-05 NOTE — Progress Notes (Signed)
Echocardiogram performed.  

## 2013-01-06 DIAGNOSIS — IMO0002 Reserved for concepts with insufficient information to code with codable children: Secondary | ICD-10-CM | POA: Diagnosis not present

## 2013-01-06 DIAGNOSIS — H25099 Other age-related incipient cataract, unspecified eye: Secondary | ICD-10-CM | POA: Diagnosis not present

## 2013-01-06 DIAGNOSIS — E119 Type 2 diabetes mellitus without complications: Secondary | ICD-10-CM | POA: Diagnosis not present

## 2013-01-27 DIAGNOSIS — Z8546 Personal history of malignant neoplasm of prostate: Secondary | ICD-10-CM | POA: Diagnosis not present

## 2013-02-03 DIAGNOSIS — Z8546 Personal history of malignant neoplasm of prostate: Secondary | ICD-10-CM | POA: Diagnosis not present

## 2013-02-03 DIAGNOSIS — R972 Elevated prostate specific antigen [PSA]: Secondary | ICD-10-CM | POA: Diagnosis not present

## 2013-02-16 DIAGNOSIS — H2589 Other age-related cataract: Secondary | ICD-10-CM | POA: Diagnosis not present

## 2013-02-17 HISTORY — PX: CATARACT EXTRACTION W/ INTRAOCULAR LENS IMPLANT: SHX1309

## 2013-02-17 LAB — HM DIABETES EYE EXAM

## 2013-03-02 DIAGNOSIS — J449 Chronic obstructive pulmonary disease, unspecified: Secondary | ICD-10-CM | POA: Diagnosis not present

## 2013-03-02 DIAGNOSIS — H2589 Other age-related cataract: Secondary | ICD-10-CM | POA: Diagnosis not present

## 2013-03-02 DIAGNOSIS — H269 Unspecified cataract: Secondary | ICD-10-CM | POA: Diagnosis not present

## 2013-03-02 DIAGNOSIS — I10 Essential (primary) hypertension: Secondary | ICD-10-CM | POA: Diagnosis not present

## 2013-03-02 DIAGNOSIS — E119 Type 2 diabetes mellitus without complications: Secondary | ICD-10-CM | POA: Diagnosis not present

## 2013-03-02 DIAGNOSIS — I2510 Atherosclerotic heart disease of native coronary artery without angina pectoris: Secondary | ICD-10-CM | POA: Diagnosis not present

## 2013-03-02 DIAGNOSIS — Z79899 Other long term (current) drug therapy: Secondary | ICD-10-CM | POA: Diagnosis not present

## 2013-03-23 ENCOUNTER — Other Ambulatory Visit: Payer: Self-pay

## 2013-03-23 ENCOUNTER — Other Ambulatory Visit: Payer: Self-pay | Admitting: *Deleted

## 2013-03-23 MED ORDER — HYDROCHLOROTHIAZIDE 25 MG PO TABS: ORAL_TABLET | ORAL | Status: DC

## 2013-03-23 MED ORDER — SIMVASTATIN 20 MG PO TABS: ORAL_TABLET | ORAL | Status: DC

## 2013-03-26 ENCOUNTER — Other Ambulatory Visit: Payer: Self-pay | Admitting: *Deleted

## 2013-03-26 MED ORDER — LOSARTAN POTASSIUM 100 MG PO TABS: ORAL_TABLET | ORAL | Status: DC

## 2013-03-29 ENCOUNTER — Other Ambulatory Visit: Payer: Self-pay | Admitting: *Deleted

## 2013-03-29 MED ORDER — AMLODIPINE BESYLATE 5 MG PO TABS: ORAL_TABLET | ORAL | Status: DC

## 2013-03-29 MED ORDER — METFORMIN HCL ER 500 MG PO TB24: ORAL_TABLET | ORAL | Status: DC

## 2013-03-29 NOTE — Telephone Encounter (Signed)
Left msg on vm stating dad has been having trouble getting refills. Called daughter back inform her some meds was sent in by Dr. Everardo All nurse. Dad is needing amlodipine.  Inform her will send to [facility]...Raechel Chute

## 2013-03-30 ENCOUNTER — Other Ambulatory Visit: Payer: Self-pay | Admitting: *Deleted

## 2013-03-30 DIAGNOSIS — Z79899 Other long term (current) drug therapy: Secondary | ICD-10-CM | POA: Diagnosis not present

## 2013-03-30 DIAGNOSIS — H2589 Other age-related cataract: Secondary | ICD-10-CM | POA: Diagnosis not present

## 2013-03-30 DIAGNOSIS — E785 Hyperlipidemia, unspecified: Secondary | ICD-10-CM | POA: Diagnosis not present

## 2013-03-30 DIAGNOSIS — E119 Type 2 diabetes mellitus without complications: Secondary | ICD-10-CM | POA: Diagnosis not present

## 2013-03-30 DIAGNOSIS — H269 Unspecified cataract: Secondary | ICD-10-CM | POA: Diagnosis not present

## 2013-03-30 DIAGNOSIS — I10 Essential (primary) hypertension: Secondary | ICD-10-CM | POA: Diagnosis not present

## 2013-03-30 MED ORDER — METFORMIN HCL ER 500 MG PO TB24: ORAL_TABLET | ORAL | Status: DC

## 2013-03-30 MED ORDER — SIMVASTATIN 20 MG PO TABS: ORAL_TABLET | ORAL | Status: DC

## 2013-04-16 ENCOUNTER — Encounter: Payer: Self-pay | Admitting: Internal Medicine

## 2013-04-16 ENCOUNTER — Other Ambulatory Visit (INDEPENDENT_AMBULATORY_CARE_PROVIDER_SITE_OTHER): Payer: Medicare Other

## 2013-04-16 ENCOUNTER — Ambulatory Visit (INDEPENDENT_AMBULATORY_CARE_PROVIDER_SITE_OTHER): Payer: Medicare Other | Admitting: Internal Medicine

## 2013-04-16 VITALS — BP 130/80 | HR 75 | Temp 98.2°F | Ht 70.0 in | Wt 173.2 lb

## 2013-04-16 DIAGNOSIS — E119 Type 2 diabetes mellitus without complications: Secondary | ICD-10-CM | POA: Diagnosis not present

## 2013-04-16 DIAGNOSIS — Z23 Encounter for immunization: Secondary | ICD-10-CM

## 2013-04-16 DIAGNOSIS — I10 Essential (primary) hypertension: Secondary | ICD-10-CM | POA: Diagnosis not present

## 2013-04-16 DIAGNOSIS — E7800 Pure hypercholesterolemia, unspecified: Secondary | ICD-10-CM | POA: Diagnosis not present

## 2013-04-16 LAB — HEMOGLOBIN A1C: Hgb A1c MFr Bld: 6.4 % (ref 4.6–6.5)

## 2013-04-16 NOTE — Assessment & Plan Note (Signed)
On metformin solo tx Follows with PCP at thhis time (remotely endo but not since well controlled) Check a1c q 6 mo,adjust tx as needed Discussed need for eye care, foot care The current medical regimen is effective;  continue present plan and medications.  Lab Results  Component Value Date   HGBA1C 6.2 10/14/2012

## 2013-04-16 NOTE — Assessment & Plan Note (Signed)
On simva for years - reviewed last lipids - check annually The current medical regimen is effective;  continue present plan and medications.  

## 2013-04-16 NOTE — Assessment & Plan Note (Signed)
BP Readings from Last 3 Encounters:  04/16/13 130/80  10/27/12 140/90  10/14/12 132/80   The current medical regimen is effective;  continue present plan and medications.

## 2013-04-16 NOTE — Progress Notes (Signed)
Pre visit review using our clinic review tool, if applicable. No additional management support is needed unless otherwise documented below in the visit note. 

## 2013-04-16 NOTE — Progress Notes (Signed)
  Subjective:    Patient ID: Adam Briggs, male    DOB: 11-18-1929, 79 y.o.   MRN: [id number]  HPI  Here for follow up - reviewed chronic medical issues  Past Medical History  Diagnosis Date  . HYPERCHOLESTEROLEMIA   . HYPERTENSION   . GERD   . DIVERTICULOSIS, COLON   . BENIGN PROSTATIC HYPERTROPHY   . DM   . Hernia   . Prostate cancer     s/p seed implant  . Cataract   . Moderate aortic stenosis 10/30/2011    echo 2013    Review of Systems      Objective:   Physical Exam BP 130/80  Pulse 75  Temp(Src) 98.2 F (36.8 C) (Oral)  Ht 5\' 10"  (1.778 m)  Wt 173 lb 4 oz (78.586 kg)  BMI 24.86 kg/m2  SpO2 97% Wt Readings from Last 3 Encounters:  04/16/13 173 lb 4 oz (78.586 kg)  10/27/12 173 lb 6.4 oz (78.654 kg)  10/14/12 171 lb 6.4 oz (77.747 kg)   Constitutional: he appears well-developed and well-nourished. No distress. wife at side Neck: Normal range of motion. Neck supple. No JVD or LAD present. No thyromegaly present.  Cardiovascular: Normal rate, regular rhythm and normal heart sounds.  No murmur heard. No BLE edema. Pulmonary/Chest: Effort normal, but right side rhonchi - breath sounds normal on left. No respiratory distress. he has no wheezes.   Skin: Skin is warm and dry. No rash noted. No erythema.  Psychiatric: he has a normal mood and affect. behavior is normal. Judgment and thought content normal.  Lab Results  Component Value Date   WBC 5.6 10/14/2012   HGB 14.5 10/14/2012   HCT 42.6 10/14/2012   PLT 178.0 10/14/2012   GLUCOSE 103* 10/14/2012   CHOL 145 10/14/2012   TRIG 111.0 10/14/2012   HDL 35.70* 10/14/2012   LDLDIRECT 110.2 12/28/2007   LDLCALC 87 10/14/2012   ALT 12 10/14/2012   AST 13 10/14/2012   NA 138 10/14/2012   K 3.9 10/14/2012   CL 104 10/14/2012   CREATININE 1.1 10/14/2012   BUN 18 10/14/2012   CO2 30 10/14/2012   TSH 2.85 10/14/2012   PSA 0.05* 09/23/2011   INR 0.9 08/10/2007   HGBA1C 6.2 10/14/2012   MICROALBUR 13.8* 10/14/2012        Assessment & Plan:    See problem list. Medications and labs reviewed today.

## 2013-04-16 NOTE — Patient Instructions (Addendum)
It was good to see you today.  We have reviewed your prior records including labs and tests today  Test(s) ordered today. Your results will be released to MyChart (or called to you) after review, usually within 72hours after test completion. If any changes need to be made, you will be notified at that same time.  Medications reviewed and updated, no changes recommended at this time. Refill on medication(s) as discussed today.  Please schedule followup in 6 months for annual wellness exam and labs, call sooner if problems.  Diabetes and Standards of Medical Care  Diabetes is complicated. You may find that your diabetes team includes a dietitian, nurse, diabetes educator, eye doctor, and more. To help everyone know what is going on and to help you get the care you deserve, the following schedule of care was developed to help keep you on track. Below are the tests, exams, vaccines, medicines, education, and plans you will need. HbA1c test This test shows how well you have controlled your glucose over the past 2 3 months. It is used to see if your diabetes management plan needs to be adjusted.   It is performed at least 2 times a year if you are meeting treatment goals.  It is performed 4 times a year if therapy has changed or if you are not meeting treatment goals. Blood pressure test  This test is performed at every routine medical visit. The goal is less than 140/90 mmHg for most people, but 130/80 mmHg in some cases. Ask your health care provider about your goal. Dental exam  Follow up with the dentist regularly. Eye exam  If you are diagnosed with type 1 diabetes as a child, get an exam upon reaching the age of 10 years or older and have had diabetes for 3 5 years. Yearly eye exams are recommended after that initial eye exam.  If you are diagnosed with type 1 diabetes as an adult, get an exam within 5 years of diagnosis and then yearly.  If you are diagnosed with type 2 diabetes, get  an exam as soon as possible after the diagnosis and then yearly. Foot care exam  Visual foot exams are performed at every routine medical visit. The exams check for cuts, injuries, or other problems with the feet.  A comprehensive foot exam should be done yearly. This includes visual inspection as well as assessing foot pulses and testing for loss of sensation.  Check your feet nightly for cuts, injuries, or other problems with your feet. Tell your health care provider if anything is not healing. Kidney function test (urine microalbumin)  This test is performed once a year.  Type 1 diabetes: The first test is performed 5 years after diagnosis.  Type 2 diabetes: The first test is performed at the time of diagnosis.  A serum creatinine and estimated glomerular filtration rate (eGFR) test is done once a year to assess the level of chronic kidney disease (CKD), if present. Lipid profile (cholesterol, HDL, LDL, triglycerides)  Performed every 5 years for most people.  The goal for LDL is less than 100 mg/dL. If you are at high risk, the goal is less than 70 mg/dL.  The goal for HDL is 40 mg/dL 50 mg/dL for men and 50 mg/dL 60 mg/dL for women. An HDL cholesterol of 60 mg/dL or higher gives some protection against heart disease.  The goal for triglycerides is less than 150 mg/dL. Influenza vaccine, pneumococcal vaccine, and hepatitis B vaccine  The influenza  vaccine is recommended yearly.  The pneumococcal vaccine is generally given once in a lifetime. However, there are some instances when another vaccination is recommended. Check with your health care provider.  The hepatitis B vaccine is also recommended for adults with diabetes. Diabetes self-management education  Education is recommended at diagnosis and ongoing as needed. Treatment plan  Your treatment plan is reviewed at every medical visit. Document Released: 03/03/2009 Document Revised: 01/06/2013 Document Reviewed:  10/06/2012 [facility] Patient Information 2014 [place], [place].

## 2013-05-06 DIAGNOSIS — H2589 Other age-related cataract: Secondary | ICD-10-CM | POA: Diagnosis not present

## 2013-09-03 DIAGNOSIS — J209 Acute bronchitis, unspecified: Secondary | ICD-10-CM | POA: Diagnosis not present

## 2013-09-22 ENCOUNTER — Other Ambulatory Visit: Payer: Self-pay | Admitting: Internal Medicine

## 2013-10-14 ENCOUNTER — Ambulatory Visit: Payer: Medicare Other | Admitting: Internal Medicine

## 2013-10-14 ENCOUNTER — Encounter: Payer: Self-pay | Admitting: Cardiology

## 2013-10-19 ENCOUNTER — Encounter: Payer: Self-pay | Admitting: Internal Medicine

## 2013-10-19 ENCOUNTER — Other Ambulatory Visit (INDEPENDENT_AMBULATORY_CARE_PROVIDER_SITE_OTHER): Payer: Medicare Other

## 2013-10-19 ENCOUNTER — Ambulatory Visit (INDEPENDENT_AMBULATORY_CARE_PROVIDER_SITE_OTHER): Payer: Medicare Other | Admitting: Internal Medicine

## 2013-10-19 VITALS — BP 130/80 | HR 70 | Temp 98.0°F | Wt 170.8 lb

## 2013-10-19 DIAGNOSIS — E7800 Pure hypercholesterolemia, unspecified: Secondary | ICD-10-CM

## 2013-10-19 DIAGNOSIS — E119 Type 2 diabetes mellitus without complications: Secondary | ICD-10-CM | POA: Diagnosis not present

## 2013-10-19 DIAGNOSIS — I10 Essential (primary) hypertension: Secondary | ICD-10-CM

## 2013-10-19 DIAGNOSIS — J309 Allergic rhinitis, unspecified: Secondary | ICD-10-CM

## 2013-10-19 LAB — BASIC METABOLIC PANEL
BUN: 15 mg/dL (ref 6–23)
CO2: 28 meq/L (ref 19–32)
CREATININE: 1 mg/dL (ref 0.4–1.5)
Calcium: 9.4 mg/dL (ref 8.4–10.5)
Chloride: 102 mEq/L (ref 96–112)
GFR: 75.09 mL/min (ref >60.00)
Glucose, Bld: 102 mg/dL — ABNORMAL HIGH (ref 70–99)
Potassium: 3.8 mEq/L (ref 3.5–5.1)
Sodium: 137 mEq/L (ref 135–145)

## 2013-10-19 LAB — LIPID PANEL
Cholesterol: 124 mg/dL (ref 0–200)
HDL: 39.8 mg/dL (ref >39.00)
LDL CALC: 64 mg/dL (ref 0–99)
Total CHOL/HDL Ratio: 3
Triglycerides: 101 mg/dL (ref 0.0–149.0)
VLDL: 20.2 mg/dL (ref 0.0–40.0)

## 2013-10-19 LAB — MICROALBUMIN / CREATININE URINE RATIO
Creatinine,U: 90.4 mg/dL
Microalb Creat Ratio: 22.1 mg/g (ref 0.0–30.0)
Microalb, Ur: 20 mg/dL — ABNORMAL HIGH (ref 0.0–1.9)

## 2013-10-19 LAB — HEMOGLOBIN A1C: HEMOGLOBIN A1C: 6.1 % (ref 4.6–6.5)

## 2013-10-19 MED ORDER — MECLIZINE HCL 25 MG PO TABS: 25.0000 mg | ORAL_TABLET | Freq: Three times a day (TID) | ORAL | Status: DC | PRN

## 2013-10-19 MED ORDER — METFORMIN HCL ER 500 MG PO TB24: 500.0000 mg | ORAL_TABLET | Freq: Every day | ORAL | Status: DC

## 2013-10-19 MED ORDER — FEXOFENADINE HCL 180 MG PO TABS: 180.0000 mg | ORAL_TABLET | Freq: Every day | ORAL | Status: DC

## 2013-10-19 MED ORDER — HYDROCHLOROTHIAZIDE 25 MG PO TABS: 25.0000 mg | ORAL_TABLET | Freq: Every day | ORAL | Status: DC

## 2013-10-19 MED ORDER — LOSARTAN POTASSIUM 100 MG PO TABS: 100.0000 mg | ORAL_TABLET | Freq: Every day | ORAL | Status: DC

## 2013-10-19 MED ORDER — FLUTICASONE PROPIONATE 50 MCG/ACT NA SUSP: 2.0000 | Freq: Every day | NASAL | Status: DC

## 2013-10-19 MED ORDER — AMLODIPINE BESYLATE 5 MG PO TABS: 5.0000 mg | ORAL_TABLET | Freq: Every day | ORAL | Status: DC

## 2013-10-19 MED ORDER — SIMVASTATIN 20 MG PO TABS: 20.0000 mg | ORAL_TABLET | Freq: Every day | ORAL | Status: DC

## 2013-10-19 NOTE — Assessment & Plan Note (Signed)
On metformin solo tx Follows with PCP at thhis time (remotely endo but not since well controlled) Check a1c q 6 mo,adjust tx as needed Discussed need for eye care, foot care The current medical regimen is effective;  continue present plan and medications.  Lab Results  Component Value Date   HGBA1C 6.4 04/16/2013

## 2013-10-19 NOTE — Assessment & Plan Note (Signed)
BP Readings from Last 3 Encounters:  10/19/13 130/80  04/16/13 130/80  10/27/12 140/90   The current medical regimen is effective;  continue present plan and medications.

## 2013-10-19 NOTE — Assessment & Plan Note (Signed)
On simva for years - reviewed last lipids - check annually The current medical regimen is effective;  continue present plan and medications.

## 2013-10-19 NOTE — Progress Notes (Signed)
Subjective:    Patient ID: Adam Briggs, male    DOB: December 04, 1929, 80 y.o.   MRN: [id number]  HPI  Patient here for follow up Reviewed chronic medical issues and interval medical events  Past Medical History  Diagnosis Date  . HYPERCHOLESTEROLEMIA   . HYPERTENSION   . GERD   . DIVERTICULOSIS, COLON   . BENIGN PROSTATIC HYPERTROPHY   . DM   . Hernia   . Prostate cancer     s/p seed implant  . Cataract   . Moderate aortic stenosis 10/30/2011    echo 2013    Review of Systems  Constitutional: Negative for fever, fatigue and unexpected weight change.  HENT: Positive for congestion and sinus pressure.   Eyes: Negative for visual disturbance.  Respiratory: Negative for cough, shortness of breath and wheezing.   Cardiovascular: Negative for chest pain, palpitations and leg swelling.  Neurological: Positive for dizziness (episodic x 1 mo). Negative for seizures, speech difficulty, weakness, numbness and headaches.       Objective:   Physical Exam  BP 130/80  Pulse 70  Temp(Src) 98 F (36.7 C) (Oral)  Wt 170 lb 12.8 oz (77.474 kg)  SpO2 95% Wt Readings from Last 3 Encounters:  10/19/13 170 lb 12.8 oz (77.474 kg)  04/16/13 173 lb 4 oz (78.586 kg)  10/27/12 173 lb 6.4 oz (78.654 kg)   Constitutional: he appears well-developed and well-nourished. No distress. wife at side HENT: NCAT, sinus nontender - TMs hazy with clear effusion, no erythema Neck: Normal range of motion. Neck supple. No JVD present. No thyromegaly present.  Cardiovascular: Normal rate, regular rhythm and normal heart sounds.  2/6 systolic murmur heard. No BLE edema. Pulmonary/Chest: Effort normal and breath sounds normal. No respiratory distress. he has no wheezes.  Neurologic: awake, alert, oriented x4 for recurrent nerves II through XII symmetrically intact. Speech, gait, balance intact. No nystagmus Psychiatric: he has a normal mood and affect. His behavior is normal. Judgment and thought content  normal.   Lab Results  Component Value Date   WBC 5.6 10/14/2012   HGB 14.5 10/14/2012   HCT 42.6 10/14/2012   PLT 178.0 10/14/2012   GLUCOSE 103* 10/14/2012   CHOL 145 10/14/2012   TRIG 111.0 10/14/2012   HDL 35.70* 10/14/2012   LDLDIRECT 110.2 12/28/2007   LDLCALC 87 10/14/2012   ALT 12 10/14/2012   AST 13 10/14/2012   NA 138 10/14/2012   K 3.9 10/14/2012   CL 104 10/14/2012   CREATININE 1.1 10/14/2012   BUN 18 10/14/2012   CO2 30 10/14/2012   TSH 2.85 10/14/2012   PSA 0.05* 09/23/2011   INR 0.9 08/10/2007   HGBA1C 6.4 04/16/2013   MICROALBUR 13.8* 10/14/2012    Dg Chest 2 View  01/16/2012   *RADIOLOGY REPORT*  Clinical Data: Chest pain, dizziness  CHEST - 2 VIEW  Comparison: Chest radiograph 06/24/2007  Findings: Normal mediastinum and cardiac silhouette.  Costophrenic angles are clear.  No effusion, infiltrate, or pneumothorax. Degenerative osteophytosis of the thoracic spine.  IMPRESSION: No acute cardiopulmonary process.   Original Report Authenticated By: Suzy Bouchard, M.D.       Assessment & Plan:   Episodic dizziness x 1 month, suspect related to allergic rhinitis with eustachian tube dysfunction. Encouraged to resume nasal steroid and daily antihistamine. Meclizine each bedtime for next 5 nights, then as needed. And neuro deficits. Symptoms occur at rest as well as with exertion so doubt cardiac etiology, but patient to followup for  echocardiogram on moderate aortic stenosis as planned later this month with cards  Problem List Items Addressed This Visit   DM - Primary      On metformin solo tx Follows with PCP at thhis time (remotely endo but not since well controlled) Check a1c q 6 mo,adjust tx as needed Discussed need for eye care, foot care The current medical regimen is effective;  continue present plan and medications.  Lab Results  Component Value Date   HGBA1C 6.4 04/16/2013      Relevant Medications      simvastatin (ZOCOR) tablet      metFORMIN (GLUCOPHAGE-XR) 24  hr tablet      losartan (COZAAR) tablet   Other Relevant Orders      Lipid panel      Hemoglobin A1c      Basic metabolic panel      Microalbumin / creatinine urine ratio   HYPERCHOLESTEROLEMIA     On simva for years - reviewed last lipids - check annually The current medical regimen is effective;  continue present plan and medications.     Relevant Medications      simvastatin (ZOCOR) tablet      losartan (COZAAR) tablet      hydrochlorothiazide tablet      amLODIpine (NORVASC) tablet   Other Relevant Orders      Lipid panel   HYPERTENSION      BP Readings from Last 3 Encounters:  10/19/13 130/80  04/16/13 130/80  10/27/12 140/90   The current medical regimen is effective;  continue present plan and medications.    Relevant Medications      simvastatin (ZOCOR) tablet      losartan (COZAAR) tablet      hydrochlorothiazide tablet      amLODIpine (NORVASC) tablet    Other Visit Diagnoses   Allergic rhinitis

## 2013-10-19 NOTE — Progress Notes (Signed)
Pre visit review using our clinic review tool, if applicable. No additional management support is needed unless otherwise documented below in the visit note. 

## 2013-10-19 NOTE — Patient Instructions (Addendum)
It was good to see you today.  We have reviewed your prior records including labs and tests today  Test(s) ordered today. Your results will be released to [facility] (or called to you) after review, usually within 72hours after test completion. If any changes need to be made, you will be notified at that same time.  Medications reviewed and updated, use meclizine at bedtime for next 5 nights, then as needed - no other changes recommended at this time.  Your prescription(s) and refills have been submitted to your pharmacy. Please take as directed and contact our office if you believe you are having problem(s) with the medication(s).  Please schedule followup in 6 months for diabetes mellitus check, call sooner if problems.  Diabetes and Standards of Medical Care  Diabetes is complicated. You may find that your diabetes team includes a dietitian, nurse, diabetes educator, eye doctor, and more. To help everyone know what is going on and to help you get the care you deserve, the following schedule of care was developed to help keep you on track. Below are the tests, exams, vaccines, medicines, education, and plans you will need. HbA1c test This test shows how well you have controlled your glucose over the past 2 3 months. It is used to see if your diabetes management plan needs to be adjusted.   It is performed at least 2 times a year if you are meeting treatment goals.  It is performed 4 times a year if therapy has changed or if you are not meeting treatment goals. Blood pressure test  This test is performed at every routine medical visit. The goal is less than 140/90 mmHg for most people, but 130/80 mmHg in some cases. Ask your health care provider about your goal. Dental exam  Follow up with the dentist regularly. Eye exam  If you are diagnosed with type 1 diabetes as a child, get an exam upon reaching the age of 60 years or older and have had diabetes for 3 5 years. Yearly eye exams are  recommended after that initial eye exam.  If you are diagnosed with type 1 diabetes as an adult, get an exam within 5 years of diagnosis and then yearly.  If you are diagnosed with type 2 diabetes, get an exam as soon as possible after the diagnosis and then yearly. Foot care exam  Visual foot exams are performed at every routine medical visit. The exams check for cuts, injuries, or other problems with the feet.  A comprehensive foot exam should be done yearly. This includes visual inspection as well as assessing foot pulses and testing for loss of sensation.  Check your feet nightly for cuts, injuries, or other problems with your feet. Tell your health care provider if anything is not healing. Kidney function test (urine microalbumin)  This test is performed once a year.  Type 1 diabetes: The first test is performed 5 years after diagnosis.  Type 2 diabetes: The first test is performed at the time of diagnosis.  A serum creatinine and estimated glomerular filtration rate (eGFR) test is done once a year to assess the level of chronic kidney disease (CKD), if present. Lipid profile (cholesterol, HDL, LDL, triglycerides)  Performed every 5 years for most people.  The goal for LDL is less than 100 mg/dL. If you are at high risk, the goal is less than 70 mg/dL.  The goal for HDL is 40 mg/dL 50 mg/dL for men and 50 mg/dL 60 mg/dL for women. An  HDL cholesterol of 60 mg/dL or higher gives some protection against heart disease.  The goal for triglycerides is less than 150 mg/dL. Influenza vaccine, pneumococcal vaccine, and hepatitis B vaccine  The influenza vaccine is recommended yearly.  The pneumococcal vaccine is generally given once in a lifetime. However, there are some instances when another vaccination is recommended. Check with your health care provider.  The hepatitis B vaccine is also recommended for adults with diabetes. Diabetes self-management education  Education is  recommended at diagnosis and ongoing as needed. Treatment plan  Your treatment plan is reviewed at every medical visit. Document Released: 03/03/2009 Document Revised: 01/06/2013 Document Reviewed: 10/06/2012 [facility] Patient Information 2014 [place].

## 2013-10-20 ENCOUNTER — Telehealth: Payer: Self-pay | Admitting: Internal Medicine

## 2013-10-20 NOTE — Telephone Encounter (Signed)
Relevant patient education mailed to patient.  

## 2013-11-01 ENCOUNTER — Ambulatory Visit: Payer: Medicare Other | Admitting: Cardiology

## 2013-12-03 ENCOUNTER — Encounter: Payer: Self-pay | Admitting: Cardiology

## 2013-12-03 ENCOUNTER — Ambulatory Visit (INDEPENDENT_AMBULATORY_CARE_PROVIDER_SITE_OTHER): Payer: Medicare Other | Admitting: Cardiology

## 2013-12-03 VITALS — BP 140/70 | HR 76 | Ht 70.0 in | Wt 170.0 lb

## 2013-12-03 DIAGNOSIS — I359 Nonrheumatic aortic valve disorder, unspecified: Secondary | ICD-10-CM | POA: Diagnosis not present

## 2013-12-03 DIAGNOSIS — I10 Essential (primary) hypertension: Secondary | ICD-10-CM | POA: Diagnosis not present

## 2013-12-03 DIAGNOSIS — I350 Nonrheumatic aortic (valve) stenosis: Secondary | ICD-10-CM

## 2013-12-03 NOTE — Progress Notes (Signed)
HPI The patient presents for his first visit with me. He has a history of mild aortic stenosis.  I have looked moderate previously the most recent echo last year demonstrated it to be mild.The patient is quite asymptomatic. He is active. He denies any chest pressure, neck or arm discomfort. He has no shortness of breath, PND or orthopnea. He denies any palpitations, presyncope or syncope. He plan is large of a garden this year because his tractor gave out.  However, he did move a four foo tree that fell on his property.   Allergies  Allergen Reactions  . Lisinopril     REACTION: Rash    Current Outpatient Prescriptions  Medication Sig Dispense Refill  . amLODipine (NORVASC) 5 MG tablet Take 1 tablet (5 mg total) by mouth daily.  90 tablet  3  . aspirin EC 81 MG tablet Take 81 mg by mouth daily.      . fexofenadine (ALLEGRA) 180 MG tablet Take 1 tablet (180 mg total) by mouth daily.  90 tablet  1  . fluticasone (FLONASE) 50 MCG/ACT nasal spray Place 2 sprays into both nostrils daily.  16 g  2  . hydrochlorothiazide (HYDRODIURIL) 25 MG tablet Take 1 tablet (25 mg total) by mouth daily.  90 tablet  3  . losartan (COZAAR) 100 MG tablet Take 1 tablet (100 mg total) by mouth daily.  90 tablet  3  . meclizine (ANTIVERT) 25 MG tablet Take 1 tablet (25 mg total) by mouth 3 (three) times daily as needed for dizziness or nausea.  30 tablet  0  . metFORMIN (GLUCOPHAGE-XR) 500 MG 24 hr tablet Take 1 tablet (500 mg total) by mouth daily with breakfast.  90 tablet  3  . simvastatin (ZOCOR) 20 MG tablet Take 1 tablet (20 mg total) by mouth daily at 6 PM.  90 tablet  3   No current facility-administered medications for this visit.    Past Medical History  Diagnosis Date  . HYPERCHOLESTEROLEMIA   . HYPERTENSION   . GERD   . DIVERTICULOSIS, COLON   . BENIGN PROSTATIC HYPERTROPHY   . DM   . Hernia   . Prostate cancer     s/p seed implant  . Cataract   . Moderate aortic stenosis 10/30/2011    echo  2013    Past Surgical History  Procedure Laterality Date  . Inguinal hernia repair      Right  . Radioactive seed implant    . Cataract extraction w/ intraocular lens implant  02/2013    R, then L 4 weeks later - [place]    ROS:  As stated in the HPI and negative for all other systems.  PHYSICAL EXAM BP 140/70  Pulse 76  Ht 5\' 10"  (1.778 m)  Wt 170 lb (77.111 kg)  BMI 24.39 kg/m2 GENERAL:  Well appearing NECK:  No jugular venous distention, waveform within normal limits, carotid upstroke brisk and symmetric, no bruits, no thyromegaly LUNGS:  Clear to auscultation bilaterally CHEST:  Unremarkable HEART:  PMI not displaced or sustained,S1 and S2 within normal limits, no S3, no S4, no clicks, no rubs, apical systolic mid peaking murmur radiating out the aortic outflow tract, no diastolic murmurs ABD:  Flat, positive bowel sounds normal in frequency in pitch, no bruits, no rebound, no guarding, no midline pulsatile mass, no hepatomegaly, no splenomegaly EXT:  2 plus pulses throughout, no edema, no cyanosis no clubbing   EKG:  Sinus rhythm, rate 76, axis within  normal limits, intervals within normal limits, no acute ST-T wave changes.  Poor anterior R wave progression.  12/03/2013   ASSESSMENT AND PLAN  AS:  The patient has no symptoms.  He has no change in exam.  No further imaging is indicated at this point.    HTN:  Blood pressure is well controlled. He will continue with the meds as listed.

## 2013-12-03 NOTE — Patient Instructions (Signed)
Your physician recommends that you schedule a follow-up appointment in: 18 months with Dr. Hochrein  

## 2014-01-10 ENCOUNTER — Ambulatory Visit (INDEPENDENT_AMBULATORY_CARE_PROVIDER_SITE_OTHER): Payer: Medicare Other | Admitting: Internal Medicine

## 2014-01-10 ENCOUNTER — Encounter: Payer: Self-pay | Admitting: Internal Medicine

## 2014-01-10 VITALS — BP 148/80 | HR 86 | Temp 98.3°F | Wt 173.2 lb

## 2014-01-10 DIAGNOSIS — M19041 Primary osteoarthritis, right hand: Secondary | ICD-10-CM

## 2014-01-10 DIAGNOSIS — H9311 Tinnitus, right ear: Secondary | ICD-10-CM

## 2014-01-10 DIAGNOSIS — I10 Essential (primary) hypertension: Secondary | ICD-10-CM | POA: Diagnosis not present

## 2014-01-10 DIAGNOSIS — M19049 Primary osteoarthritis, unspecified hand: Secondary | ICD-10-CM

## 2014-01-10 DIAGNOSIS — R42 Dizziness and giddiness: Secondary | ICD-10-CM | POA: Diagnosis not present

## 2014-01-10 DIAGNOSIS — H9319 Tinnitus, unspecified ear: Secondary | ICD-10-CM

## 2014-01-10 DIAGNOSIS — M19042 Primary osteoarthritis, left hand: Secondary | ICD-10-CM

## 2014-01-10 DIAGNOSIS — H9190 Unspecified hearing loss, unspecified ear: Secondary | ICD-10-CM

## 2014-01-10 DIAGNOSIS — H9193 Unspecified hearing loss, bilateral: Secondary | ICD-10-CM

## 2014-01-10 NOTE — Progress Notes (Signed)
   Subjective:    Patient ID: Adam Briggs, male    DOB: 12/06/1929, 80 y.o.   MRN: [id number]  HPI  He has had intermittent frank vertigo for  3 years; he was seen for an exacerbation 6-8 months ago and given Antivert with symptomatic response  Now the symptoms have recurred and are not responding to [place]. These typically last approximately 1 minute.  The only specific trigger he notices is flexion of the cervical spine anteriorly. It is not related to extension of the cervical spine. Even this is not a consistent finding. It has occurred when brushing his teeth or slipping on his T-shirt.  He specifically denies any neuro or cardiac prodrome.  He has chronic hearing loss but in the last week he's had tinnitus; he thinks that this is greatest on the right.   He's had some postnasal drainage without signs of rhinosinusitis.      Review of Systems  Denied were any change in heart rhythm or rate prior to the event. There was no associated chest pain or shortness of breath . Also specifically denied prior to the episode were headache, limb weakness, tingling, or numbness. No seizure activity noted. Frontal headache, facial pain , nasal purulence, dental pain, sore throat , otic pain or otic discharge denied. No fever , chills or sweats.  He denies blurred vision, double vision, loss of vision.       Objective:   Physical Exam  BP recheck 132/74. There is prominence of the lower lids; they appear somewhat puffy. No angioedema present. He has arcus senilis Pupils are small. Wax is present in the right otic canal with decreased visualization of the right tympanic membrane. Hearing is decreased significantly bilaterally. Tuning fork exam is normal. He has a grade 6.5-5 systolic murmur with radiation into the left carotid. He has marked DIP osteoarthritic changes. Gait, Romberg testing, finger to nose, and positional testing for vertigo are normal   General appearance  :adequately nourished; in no distress.Appears younger than stated age Eyes: No conjunctival inflammation or scleral icterus is present. Oral exam: Dental hygiene is good. Lips and gums are healthy appearing.There is no oropharyngeal erythema or exudate noted.  Heart:  Normal rate and regular rhythm. S1 and S2 normal without gallop, murmur, click, rub or other extra sounds   Lungs:Chest clear to auscultation; no wheezes, rhonchi,rales ,or rubs present.No increased work of breathing.  Abdomen: bowel sounds normal, soft and non-tender without masses, organomegaly or hernias noted.  No guarding or rebound. No flank tenderness to percussion. Skin:Warm & dry.  Intact without suspicious lesions or rashes ; no jaundice or tenting Lymphatic: No lymphadenopathy is noted about the head, neck, axilla        Assessment & Plan:  #1 intermittent frank vertigo; the only perceived trigger is neck flexion  #2 tinnitus right ear x1 week  #3 chronic hearing loss  #4 valvular heart disease  Plan: ENT evaluation of the vertigo and tinnitus.  It is possible he is having positional compromise of the carotid artery circulation; there is no clinical history to suggest vertebrobasilar insufficiency.

## 2014-01-10 NOTE — Patient Instructions (Signed)
Plain Mucinex (NOT D) for thick secretions ;force NON dairy fluids .  Flonase  1 spray in each nostril twice a day as needed. Use the "crossover" technique into opposite nostril spraying toward opposite ear @ 45 degree angle, not straight up into nostril.  Plain Allegra (NOT D )  160 daily , Loratidine 10 mg , OR Zyrtec 10 mg @ bedtime  as needed for itchy eyes & sneezing.  The ENT referral will be scheduled and you'll be notified of the time.

## 2014-01-10 NOTE — Progress Notes (Signed)
Pre visit review using our clinic review tool, if applicable. No additional management support is needed unless otherwise documented below in the visit note. 

## 2014-02-09 DIAGNOSIS — R972 Elevated prostate specific antigen [PSA]: Secondary | ICD-10-CM | POA: Diagnosis not present

## 2014-03-04 ENCOUNTER — Other Ambulatory Visit: Payer: Self-pay

## 2014-03-21 ENCOUNTER — Other Ambulatory Visit: Payer: Self-pay | Admitting: Endocrinology

## 2014-03-21 ENCOUNTER — Other Ambulatory Visit: Payer: Self-pay | Admitting: Internal Medicine

## 2014-03-21 NOTE — Telephone Encounter (Signed)
Please advise if ok to refill medication pt has not been seen since 11//2013 thanks!

## 2014-03-22 ENCOUNTER — Other Ambulatory Visit: Payer: Self-pay

## 2014-03-22 ENCOUNTER — Ambulatory Visit: Payer: Medicare Other

## 2014-03-22 ENCOUNTER — Ambulatory Visit (INDEPENDENT_AMBULATORY_CARE_PROVIDER_SITE_OTHER): Payer: Medicare Other

## 2014-03-22 DIAGNOSIS — Z23 Encounter for immunization: Secondary | ICD-10-CM | POA: Diagnosis not present

## 2014-03-22 MED ORDER — LOSARTAN POTASSIUM 100 MG PO TABS: 100.0000 mg | ORAL_TABLET | Freq: Every day | ORAL | Status: DC

## 2014-04-13 DIAGNOSIS — Z8546 Personal history of malignant neoplasm of prostate: Secondary | ICD-10-CM | POA: Diagnosis not present

## 2014-04-13 DIAGNOSIS — N3281 Overactive bladder: Secondary | ICD-10-CM | POA: Diagnosis not present

## 2014-04-13 DIAGNOSIS — R972 Elevated prostate specific antigen [PSA]: Secondary | ICD-10-CM | POA: Diagnosis not present

## 2014-04-13 DIAGNOSIS — N320 Bladder-neck obstruction: Secondary | ICD-10-CM | POA: Diagnosis not present

## 2014-04-21 ENCOUNTER — Ambulatory Visit: Payer: Medicare Other | Admitting: Internal Medicine

## 2014-05-05 ENCOUNTER — Other Ambulatory Visit (INDEPENDENT_AMBULATORY_CARE_PROVIDER_SITE_OTHER): Payer: Medicare Other

## 2014-05-05 ENCOUNTER — Ambulatory Visit (INDEPENDENT_AMBULATORY_CARE_PROVIDER_SITE_OTHER): Payer: Medicare Other | Admitting: Internal Medicine

## 2014-05-05 ENCOUNTER — Encounter: Payer: Self-pay | Admitting: Internal Medicine

## 2014-05-05 VITALS — BP 170/88 | HR 79 | Temp 98.5°F | Wt 176.4 lb

## 2014-05-05 DIAGNOSIS — K4090 Unilateral inguinal hernia, without obstruction or gangrene, not specified as recurrent: Secondary | ICD-10-CM | POA: Insufficient documentation

## 2014-05-05 DIAGNOSIS — K4091 Unilateral inguinal hernia, without obstruction or gangrene, recurrent: Secondary | ICD-10-CM

## 2014-05-05 DIAGNOSIS — R7309 Other abnormal glucose: Secondary | ICD-10-CM

## 2014-05-05 DIAGNOSIS — I10 Essential (primary) hypertension: Secondary | ICD-10-CM

## 2014-05-05 DIAGNOSIS — R7303 Prediabetes: Secondary | ICD-10-CM

## 2014-05-05 LAB — BASIC METABOLIC PANEL
BUN: 19 mg/dL (ref 6–23)
CO2: 26 mEq/L (ref 19–32)
Calcium: 9.7 mg/dL (ref 8.4–10.5)
Chloride: 104 mEq/L (ref 96–112)
Creatinine, Ser: 1.1 mg/dL (ref 0.4–1.5)
GFR: 68.68 mL/min (ref >60.00)
GLUCOSE: 118 mg/dL — AB (ref 70–99)
POTASSIUM: 3.9 meq/L (ref 3.5–5.1)
SODIUM: 139 meq/L (ref 135–145)

## 2014-05-05 LAB — HEMOGLOBIN A1C: HEMOGLOBIN A1C: 6.8 % — AB (ref 4.6–6.5)

## 2014-05-05 NOTE — Progress Notes (Signed)
   Subjective:    Patient ID: Adam Briggs, male    DOB: 06-24-1929, 80 y.o.   MRN: [id number]  HPI  He is here for referral for hernia surgery  He has had a hernia in the left inguinal area 3-4 years. In the past it only was issue after prolonged standing. It is now recurring even if standing for short periods of time. He says typically he is able to reduce it at night.  This is not associated with any constitutional, abdominal or gastrointestinal symptoms  He's been compliant with his blood pressure medicines without adverse effects. He states they average 120-130 over the 70s. He denies any cardiac symptoms.  He's been monitoring glucoses and they run 67-140 w/o hypoglycemia. His last A1c was 6.1 in June. Renal function was normal at that time.    Review of Systems   Chest pain, palpitations, tachycardia, exertional dyspnea, paroxysmal nocturnal dyspnea, claudication or edema are absent.  Unexplained weight loss, abdominal pain, significant dyspepsia, dysphagia, melena, rectal bleeding, or persistently small caliber stools are denied.     Objective:   Physical Exam   Pertinent positive findings include: He appears much younger than stated age. Bilateral ptosis Repeat BP 145/92 He has a grade 1 systolic murmur at the base Dorsalis pedis pulses are slightly decreased There is a large hernia filling the left scrotum. It is not reducible.   General appearance :adequately nourished; in no distress. Eyes: No conjunctival inflammation or scleral icterus is present. Heart:  Normal rate and regular rhythm. S1 and S2 normal without gallop,  click, rub or other extra sounds   Lungs:Chest clear to auscultation; no wheezes, rhonchi,rales ,or rubs present.No increased work of breathing.  Abdomen: bowel sounds normal, soft and non-tender without masses, organomegaly or hernias noted.  No guarding or rebound.  Vascular : all pulses equal ; no bruits present. Skin:Warm & dry.  Intact  without suspicious lesions or rashes ; no jaundice or tenting Lymphatic: No lymphadenopathy is noted about the head, neck, axilla, or inguinal areas.           Assessment & Plan:  #1L inguinal hernia, surgery indicated #2 HTN, adequate control #2 prediabetes  See orders

## 2014-05-05 NOTE — Assessment & Plan Note (Signed)
Blood pressure goals reviewed. BMET 

## 2014-05-05 NOTE — Patient Instructions (Signed)
Your next office appointment will be determined based upon review of your pending labs. Those instructions will be transmitted to you by mail Followup as needed for your acute issue. Please report any significant change in your symptoms. The Surgery referral will be scheduled and you'll be notified of the time.Please call the Referral Co-Ordinator @ [phone] if you have not been notified of appointment time within 7-10 days. Minimal Blood Pressure Goal= AVERAGE < 140/90;  Ideal is an AVERAGE < 135/85. This AVERAGE should be calculated from @ least 5-7 BP readings taken @ different times of day on different days of week. You should not respond to isolated BP readings , but rather the AVERAGE for that week .Please bring your  blood pressure cuff to office visits to verify that it is reliable.It  can also be checked against the blood pressure device at the pharmacy. Finger or wrist cuffs are not dependable; an arm cuff is.

## 2014-05-05 NOTE — Assessment & Plan Note (Signed)
A1c

## 2014-05-05 NOTE — Progress Notes (Signed)
Pre visit review using our clinic review tool, if applicable. No additional management support is needed unless otherwise documented below in the visit note. 

## 2014-05-30 ENCOUNTER — Ambulatory Visit (INDEPENDENT_AMBULATORY_CARE_PROVIDER_SITE_OTHER): Payer: Self-pay | Admitting: Surgery

## 2014-05-30 DIAGNOSIS — K4090 Unilateral inguinal hernia, without obstruction or gangrene, not specified as recurrent: Secondary | ICD-10-CM | POA: Diagnosis not present

## 2014-06-14 ENCOUNTER — Ambulatory Visit: Payer: Medicare Other | Admitting: Internal Medicine

## 2014-06-22 ENCOUNTER — Telehealth: Payer: Self-pay | Admitting: Cardiology

## 2014-06-22 NOTE — Telephone Encounter (Signed)
Dr. Harlow Asa needs clearance for hernia repair

## 2014-06-22 NOTE — Telephone Encounter (Signed)
New message     Request for surgical clearance:  1. What type of surgery is being performed? Hernia repair  2. When is this surgery scheduled? 07-15-14  Are there any medications that need to be held prior to surgery and how long? aspirin 3. Name of physician performing surgery?Dr Harlow Asa  4. What is your office phone and fax number? Fax [phone]

## 2014-06-23 ENCOUNTER — Encounter: Payer: Self-pay | Admitting: Internal Medicine

## 2014-06-23 ENCOUNTER — Ambulatory Visit (INDEPENDENT_AMBULATORY_CARE_PROVIDER_SITE_OTHER): Payer: Medicare Other | Admitting: Internal Medicine

## 2014-06-23 VITALS — BP 148/74 | HR 79 | Temp 99.2°F | Resp 16 | Ht 70.0 in | Wt 175.1 lb

## 2014-06-23 DIAGNOSIS — Z0000 Encounter for general adult medical examination without abnormal findings: Secondary | ICD-10-CM | POA: Insufficient documentation

## 2014-06-23 DIAGNOSIS — E7800 Pure hypercholesterolemia, unspecified: Secondary | ICD-10-CM

## 2014-06-23 DIAGNOSIS — E119 Type 2 diabetes mellitus without complications: Secondary | ICD-10-CM

## 2014-06-23 DIAGNOSIS — I10 Essential (primary) hypertension: Secondary | ICD-10-CM

## 2014-06-23 DIAGNOSIS — K4091 Unilateral inguinal hernia, without obstruction or gangrene, recurrent: Secondary | ICD-10-CM

## 2014-06-23 MED ORDER — MECLIZINE HCL 25 MG PO TABS: 25.0000 mg | ORAL_TABLET | Freq: Three times a day (TID) | ORAL | Status: DC | PRN

## 2014-06-23 NOTE — Assessment & Plan Note (Signed)
Has aged out of colon cancer screening, no symptoms. Has not had shingles shot and mentioned today but he is not interested. He also wants to think about prevnar booster. Has had pneumonia 23 shot previously and flu shot this year. No fall risk and no signs or symptoms of depression.

## 2014-06-23 NOTE — Assessment & Plan Note (Signed)
Last lipid panel 6/15 and at goal on zocor. No side effects so continue therapy.

## 2014-06-23 NOTE — Progress Notes (Signed)
Pre visit review using our clinic review tool, if applicable. No additional management support is needed unless otherwise documented below in the visit note. 

## 2014-06-23 NOTE — Assessment & Plan Note (Signed)
Recent BMP reviewed and without abnormality. Continue amlodipine, hctz, losartan. BP at goal today.

## 2014-06-23 NOTE — Progress Notes (Signed)
   Subjective:    Patient ID: Adam Briggs, male    DOB: Nov 18, 1929, 81 y.o.   MRN: [id number]  HPI The patient is an 81 YO man who is coming in for wellness. He does have PMH of DM type 2 (controlled on metformin, no complications), HTN (controlled on amlodipine, hctz, losartan, no complications), hyperlipidemia (at goal, stable on zocor). No new complaints but he does need some refills. Having inguinal hernia surgery later this month. No chest pains on exertion or SOB. He is able to walk 1 flight of steps without symptoms or stopping.   Diet: DM since diabetic Physical activity: sedentary Depression/mood screen: negative Hearing: intact to whispered voice Visual acuity: grossly normal, performs annual eye exam  ADLs: capable, denies any joint discomfort Fall risk: none Home safety: good Cognitive evaluation: intact to orientation, naming, recall and repetition EOL planning: adv directives, full code/ I agree  I have personally reviewed and have noted 1. The patient's medical and social history 2. Their use of alcohol, tobacco or illicit drugs 3. Their current medications and supplements 4. The patient's functional ability including ADL's, fall risks, home safety risks and hearing or visual impairment. 5. Diet and physical activities 6. Evidence for depression or mood disorders 7.  Care team reviewed and updated  Review of Systems  Constitutional: Negative for fever, activity change, appetite change, fatigue and unexpected weight change.  HENT: Negative.   Eyes: Negative.   Respiratory: Negative for cough, chest tightness, shortness of breath and wheezing.   Cardiovascular: Negative for chest pain, palpitations and leg swelling.  Gastrointestinal: Negative for nausea, abdominal pain, diarrhea, constipation and abdominal distention.  Musculoskeletal: Negative.   Skin: Negative.   Neurological: Positive for dizziness. Negative for weakness, light-headedness and headaches.   Intermittent and takes prn meclizine, rarely uses  Psychiatric/Behavioral: Negative.       Objective:   Physical Exam  Constitutional: He appears well-developed and well-nourished.  HENT:  Head: Normocephalic and atraumatic.  Eyes: EOM are normal.  Neck: Normal range of motion.  Cardiovascular: Normal rate and regular rhythm.   Murmur heard. Systolic, over the whole heart  Pulmonary/Chest: Effort normal and breath sounds normal. No respiratory distress. He has no wheezes. He has no rales.  Abdominal: Soft. Bowel sounds are normal. He exhibits no distension. There is no tenderness. There is no rebound.  Musculoskeletal: He exhibits no edema.  Neurological: Coordination normal.  Skin: Skin is warm and dry.   Filed Vitals:   06/23/14 0759  BP: 148/74  Pulse: 79  Temp: 99.2 F (37.3 C)  TempSrc: Oral  Resp: 16  Height: 5\' 10"  (1.778 m)  Weight: 175 lb 1.9 oz (79.434 kg)  SpO2: 97%      Assessment & Plan:  Shingles declines and prevnar 13 declined today.

## 2014-06-23 NOTE — Assessment & Plan Note (Signed)
Getting surgery later this month, not incarcerated.

## 2014-06-23 NOTE — Patient Instructions (Signed)
We will send in the refills including the meclizine that you can take for dizziness. With the dizziness medicine try to take it only when you need it because if you take it everyday it can cause dizziness.   We will see you back in about 6 months or so to check on the blood work. If you have any problems before then or need the ears cleaned out just give our office a call.   Exercise to Stay Healthy Exercise helps you become and stay healthy. EXERCISE IDEAS AND TIPS Choose exercises that:  You enjoy.  Fit into your day. You do not need to exercise really hard to be healthy. You can do exercises at a slow or medium level and stay healthy. You can:  Stretch before and after working out.  Try yoga, Pilates, or tai chi.  Lift weights.  Walk fast, swim, jog, run, climb stairs, bicycle, dance, or rollerskate.  Take aerobic classes. Exercises that burn about 150 calories:  Running 1  miles in 15 minutes.  Playing volleyball for 45 to 60 minutes.  Washing and waxing a car for 45 to 60 minutes.  Playing touch football for 45 minutes.  Walking 1  miles in 35 minutes.  Pushing a stroller 1  miles in 30 minutes.  Playing basketball for 30 minutes.  Raking leaves for 30 minutes.  Bicycling 5 miles in 30 minutes.  Walking 2 miles in 30 minutes.  Dancing for 30 minutes.  Shoveling snow for 15 minutes.  Swimming laps for 20 minutes.  Walking up stairs for 15 minutes.  Bicycling 4 miles in 15 minutes.  Gardening for 30 to 45 minutes.  Jumping rope for 15 minutes.  Washing windows or floors for 45 to 60 minutes. Document Released: 06/08/2010 Document Revised: 07/29/2011 Document Reviewed: 06/08/2010 [facility] Patient Information 2015 [place], [place]. This information is not intended to replace advice given to you by your health care provider. Make sure you discuss any questions you have with your health care provider.

## 2014-06-23 NOTE — Telephone Encounter (Signed)
The patient has no high risk cardiovascular problems. Therefore, based on ACC/AHA guidelines, the patient would be at acceptable risk for the planned procedure without further cardiovascular testing.

## 2014-06-23 NOTE — Assessment & Plan Note (Signed)
Foot and eye exam up to date. Last HgA1c reviewed. He does take metformin and ARB. Will continue. Performs yearly eye exam. Asked him to take pneumonia booster today and he will think about it.

## 2014-06-27 ENCOUNTER — Encounter: Payer: Self-pay | Admitting: *Deleted

## 2014-06-27 ENCOUNTER — Other Ambulatory Visit (INDEPENDENT_AMBULATORY_CARE_PROVIDER_SITE_OTHER): Payer: Self-pay

## 2014-06-27 NOTE — Telephone Encounter (Signed)
Clearance sent to Dr. Gala Lewandowsky office

## 2014-07-08 NOTE — Patient Instructions (Signed)
Adam Briggs  07/08/2014   Your procedure is scheduled on: 07/15/2014    Report to [facility] Main  Entrance and follow signs to               [place] at     0730 AM.  Call this number if you have problems the morning of surgery [phone]   Remember:  Do not eat food or drink liquids :After Midnight.     Take these medicines the morning of surgery with A SIP OF WATER:  Amlodipine ( Norvasc)                                You may not have any metal on your body including hair pins and              piercings  Do not wear jewelry, lotions, powders or perfumes., deodorant.                           Men may shave face and neck.   Do not bring valuables to the hospital. [place].  Contacts, dentures or bridgework may not be worn into surgery.  Leave suitcase in the car. After surgery it may be brought to your room.        Special Instructions:coughing and deep breathing exercises, leg exercises               Please read over the following fact sheets you were given: _____________________________________________________________________             [facility] - Preparing for Surgery Before surgery, you can play an important role.  Because skin is not sterile, your skin needs to be as free of germs as possible.  You can reduce the number of germs on your skin by washing with CHG (chlorahexidine gluconate) soap before surgery.  CHG is an antiseptic cleaner which kills germs and bonds with the skin to continue killing germs even after washing. Please DO NOT use if you have an allergy to CHG or antibacterial soaps.  If your skin becomes reddened/irritated stop using the CHG and inform your nurse when you arrive at Short Stay. Do not shave (including legs and underarms) for at least 48 hours prior to the first CHG shower.  You may shave your face/neck. Please follow these instructions carefully:  1.   Shower with CHG Soap the night before surgery and the  morning of Surgery.  2.  If you choose to wash your hair, wash your hair first as usual with your  normal  shampoo.  3.  After you shampoo, rinse your hair and body thoroughly to remove the  shampoo.                           4.  Use CHG as you would any other liquid soap.  You can apply chg directly  to the skin and wash                       Gently with a scrungie or clean washcloth.  5.  Apply the CHG Soap to your body ONLY FROM  THE NECK DOWN.   Do not use on face/ open                           Wound or open sores. Avoid contact with eyes, ears mouth and genitals (private parts).                       Wash face,  Genitals (private parts) with your normal soap.             6.  Wash thoroughly, paying special attention to the area where your surgery  will be performed.  7.  Thoroughly rinse your body with warm water from the neck down.  8.  DO NOT shower/wash with your normal soap after using and rinsing off  the CHG Soap.                9.  Pat yourself dry with a clean towel.            10.  Wear clean pajamas.            11.  Place clean sheets on your bed the night of your first shower and do not  sleep with pets. Day of Surgery : Do not apply any lotions/deodorants the morning of surgery.  Please wear clean clothes to the hospital/surgery center.  FAILURE TO FOLLOW THESE INSTRUCTIONS MAY RESULT IN THE CANCELLATION OF YOUR SURGERY PATIENT SIGNATURE_________________________________  NURSE SIGNATURE__________________________________  ________________________________________________________________________

## 2014-07-11 ENCOUNTER — Encounter (HOSPITAL_COMMUNITY): Payer: Self-pay

## 2014-07-11 ENCOUNTER — Encounter (HOSPITAL_COMMUNITY)
Admission: RE | Admit: 2014-07-11 | Discharge: 2014-07-11 | Disposition: A | Discharge status: Home / Self Care | Payer: Medicare Other | Source: Ambulatory Visit | Attending: Surgery | Admitting: Surgery

## 2014-07-11 DIAGNOSIS — K4090 Unilateral inguinal hernia, without obstruction or gangrene, not specified as recurrent: Secondary | ICD-10-CM | POA: Insufficient documentation

## 2014-07-11 DIAGNOSIS — Z01818 Encounter for other preprocedural examination: Secondary | ICD-10-CM | POA: Diagnosis not present

## 2014-07-11 HISTORY — DX: Cardiac murmur, unspecified: R01.1

## 2014-07-11 LAB — BASIC METABOLIC PANEL
ANION GAP: 8 (ref 5–15)
BUN: 22 mg/dL (ref 6–23)
CALCIUM: 9.6 mg/dL (ref 8.4–10.5)
CO2: 30 mmol/L (ref 19–32)
CREATININE: 1.12 mg/dL (ref 0.50–1.35)
Chloride: 106 mmol/L (ref 96–112)
GFR, EST AFRICAN AMERICAN: 68 mL/min — AB (ref >90)
GFR, EST NON AFRICAN AMERICAN: 59 mL/min — AB (ref >90)
Glucose, Bld: 105 mg/dL — ABNORMAL HIGH (ref 70–99)
POTASSIUM: 4.3 mmol/L (ref 3.5–5.1)
SODIUM: 144 mmol/L (ref 135–145)

## 2014-07-11 LAB — CBC
HCT: 42.7 % (ref 39.0–52.0)
HEMOGLOBIN: 14 g/dL (ref 13.0–17.0)
MCH: 28.9 pg (ref 26.0–34.0)
MCHC: 32.8 g/dL (ref 30.0–36.0)
MCV: 88.2 fL (ref 78.0–100.0)
Platelets: 186 10*3/uL (ref 150–400)
RBC: 4.84 MIL/uL (ref 4.22–5.81)
RDW: 13.5 % (ref 11.5–15.5)
WBC: 4.9 10*3/uL (ref 4.0–10.5)

## 2014-07-11 NOTE — Progress Notes (Signed)
12/03/2013- EKG EPIC  11/05/12- ECHO EPIC  LOV with Dr Percival Spanish - 12/03/13 EPIC

## 2014-07-13 NOTE — Progress Notes (Signed)
Quick Note:  These results are acceptable for scheduled surgery.  Adam Briggs [name], MD, FACS [facility] Office: [phone]   ______ 

## 2014-07-15 ENCOUNTER — Observation Stay (HOSPITAL_COMMUNITY)
Admission: RE | Admit: 2014-07-15 | Discharge: 2014-07-16 | Disposition: A | Discharge status: Home / Self Care | Payer: Medicare Other | Source: Ambulatory Visit | Attending: Surgery | Admitting: Surgery

## 2014-07-15 ENCOUNTER — Encounter (HOSPITAL_COMMUNITY)
Admission: RE | Disposition: A | Discharge status: Home / Self Care | Payer: Self-pay | Source: Ambulatory Visit | Attending: Surgery

## 2014-07-15 ENCOUNTER — Ambulatory Visit (HOSPITAL_COMMUNITY): Payer: Medicare Other | Admitting: Anesthesiology

## 2014-07-15 ENCOUNTER — Encounter (HOSPITAL_COMMUNITY): Payer: Self-pay | Admitting: Surgery

## 2014-07-15 DIAGNOSIS — Z87891 Personal history of nicotine dependence: Secondary | ICD-10-CM | POA: Insufficient documentation

## 2014-07-15 DIAGNOSIS — E780 Pure hypercholesterolemia: Secondary | ICD-10-CM | POA: Insufficient documentation

## 2014-07-15 DIAGNOSIS — Z7982 Long term (current) use of aspirin: Secondary | ICD-10-CM | POA: Diagnosis not present

## 2014-07-15 DIAGNOSIS — Z8546 Personal history of malignant neoplasm of prostate: Secondary | ICD-10-CM | POA: Insufficient documentation

## 2014-07-15 DIAGNOSIS — F1590 Other stimulant use, unspecified, uncomplicated: Secondary | ICD-10-CM | POA: Insufficient documentation

## 2014-07-15 DIAGNOSIS — I350 Nonrheumatic aortic (valve) stenosis: Secondary | ICD-10-CM | POA: Diagnosis not present

## 2014-07-15 DIAGNOSIS — G8918 Other acute postprocedural pain: Secondary | ICD-10-CM | POA: Diagnosis not present

## 2014-07-15 DIAGNOSIS — K219 Gastro-esophageal reflux disease without esophagitis: Secondary | ICD-10-CM | POA: Insufficient documentation

## 2014-07-15 DIAGNOSIS — Z888 Allergy status to other drugs, medicaments and biological substances status: Secondary | ICD-10-CM | POA: Insufficient documentation

## 2014-07-15 DIAGNOSIS — I10 Essential (primary) hypertension: Secondary | ICD-10-CM | POA: Diagnosis not present

## 2014-07-15 DIAGNOSIS — K4030 Unilateral inguinal hernia, with obstruction, without gangrene, not specified as recurrent: Secondary | ICD-10-CM

## 2014-07-15 DIAGNOSIS — Z86010 Personal history of colonic polyps: Secondary | ICD-10-CM | POA: Insufficient documentation

## 2014-07-15 DIAGNOSIS — N400 Enlarged prostate without lower urinary tract symptoms: Secondary | ICD-10-CM | POA: Insufficient documentation

## 2014-07-15 DIAGNOSIS — K4090 Unilateral inguinal hernia, without obstruction or gangrene, not specified as recurrent: Principal | ICD-10-CM | POA: Insufficient documentation

## 2014-07-15 HISTORY — PX: INGUINAL HERNIA REPAIR: SHX194

## 2014-07-15 HISTORY — PX: INSERTION OF MESH: SHX5868

## 2014-07-15 LAB — GLUCOSE, CAPILLARY
GLUCOSE-CAPILLARY: 135 mg/dL — AB (ref 70–99)
Glucose-Capillary: 141 mg/dL — ABNORMAL HIGH (ref 70–99)
Glucose-Capillary: 116 mg/dL — ABNORMAL HIGH (ref 70–99)
Glucose-Capillary: 151 mg/dL — ABNORMAL HIGH (ref 70–99)

## 2014-07-15 SURGERY — REPAIR, HERNIA, INGUINAL, ADULT
Anesthesia: General | Site: Groin | Laterality: Left

## 2014-07-15 MED ORDER — ACETAMINOPHEN 325 MG PO TABS: 650.0000 mg | ORAL_TABLET | ORAL | Status: DC | PRN

## 2014-07-15 MED ORDER — INSULIN ASPART 100 UNIT/ML ~~LOC~~ SOLN
0.0000 [IU] | Freq: Three times a day (TID) | SUBCUTANEOUS | Status: DC
  Administered 2014-07-16: 3 [IU] via SUBCUTANEOUS

## 2014-07-15 MED ORDER — ROCURONIUM BROMIDE 100 MG/10ML IV SOLN
INTRAVENOUS | Status: DC | PRN
  Administered 2014-07-15: 40 mg via INTRAVENOUS
  Administered 2014-07-15: 5 mg via INTRAVENOUS
  Administered 2014-07-15: 10 mg via INTRAVENOUS

## 2014-07-15 MED ORDER — BUPIVACAINE-EPINEPHRINE (PF) 0.5% -1:200000 IJ SOLN
INTRAMUSCULAR | Status: DC | PRN
  Administered 2014-07-15: 20 mL

## 2014-07-15 MED ORDER — FENTANYL CITRATE 0.05 MG/ML IJ SOLN
INTRAMUSCULAR | Status: AC
  Filled 2014-07-15: qty 2

## 2014-07-15 MED ORDER — CEFAZOLIN SODIUM-DEXTROSE 2-3 GM-% IV SOLR
2.0000 g | INTRAVENOUS | Status: AC
  Administered 2014-07-15: 2 g via INTRAVENOUS

## 2014-07-15 MED ORDER — ONDANSETRON HCL 4 MG/2ML IJ SOLN
INTRAMUSCULAR | Status: AC
  Filled 2014-07-15: qty 2

## 2014-07-15 MED ORDER — HYDROMORPHONE HCL 1 MG/ML IJ SOLN
INTRAMUSCULAR | Status: AC
  Filled 2014-07-15: qty 1

## 2014-07-15 MED ORDER — BUPIVACAINE HCL (PF) 0.5 % IJ SOLN
INTRAMUSCULAR | Status: DC | PRN
  Administered 2014-07-15: 20 mL

## 2014-07-15 MED ORDER — PROPOFOL 10 MG/ML IV BOLUS
INTRAVENOUS | Status: DC | PRN
  Administered 2014-07-15: 120 mg via INTRAVENOUS

## 2014-07-15 MED ORDER — CEFAZOLIN SODIUM-DEXTROSE 2-3 GM-% IV SOLR
INTRAVENOUS | Status: AC
  Filled 2014-07-15: qty 50

## 2014-07-15 MED ORDER — GLYCOPYRROLATE 0.2 MG/ML IJ SOLN
INTRAMUSCULAR | Status: AC
  Filled 2014-07-15: qty 3

## 2014-07-15 MED ORDER — LACTATED RINGERS IV SOLN
INTRAVENOUS | Status: DC
  Administered 2014-07-15: 11:00:00 via INTRAVENOUS
  Administered 2014-07-15: 1000 mL via INTRAVENOUS

## 2014-07-15 MED ORDER — HYDROCODONE-ACETAMINOPHEN 5-325 MG PO TABS: 1.0000 | ORAL_TABLET | ORAL | Status: DC | PRN

## 2014-07-15 MED ORDER — HYDROMORPHONE HCL 1 MG/ML IJ SOLN
1.0000 mg | INTRAMUSCULAR | Status: DC | PRN
Start: 2014-07-15 — End: 2014-07-16
  Administered 2014-07-15: 0.5 mg via INTRAVENOUS
  Administered 2014-07-15: 1 mg via INTRAVENOUS
  Filled 2014-07-15: qty 1

## 2014-07-15 MED ORDER — OXYCODONE HCL 5 MG PO TABS: 5.0000 mg | ORAL_TABLET | ORAL | Status: DC | PRN

## 2014-07-15 MED ORDER — AMLODIPINE BESYLATE 5 MG PO TABS
5.0000 mg | ORAL_TABLET | Freq: Every day | ORAL | Status: DC
  Filled 2014-07-15: qty 1

## 2014-07-15 MED ORDER — KCL IN DEXTROSE-NACL 20-5-0.45 MEQ/L-%-% IV SOLN
INTRAVENOUS | Status: DC
  Administered 2014-07-15: 16:00:00 via INTRAVENOUS
  Filled 2014-07-15 (×2): qty 1000

## 2014-07-15 MED ORDER — PROMETHAZINE HCL 25 MG/ML IJ SOLN: 6.2500 mg | INTRAMUSCULAR | Status: DC | PRN

## 2014-07-15 MED ORDER — OXYCODONE HCL 5 MG PO TABS: 5.0000 mg | ORAL_TABLET | Freq: Once | ORAL | Status: DC | PRN

## 2014-07-15 MED ORDER — INSULIN ASPART 100 UNIT/ML ~~LOC~~ SOLN: 0.0000 [IU] | Freq: Every day | SUBCUTANEOUS | Status: DC

## 2014-07-15 MED ORDER — LOSARTAN POTASSIUM 50 MG PO TABS
100.0000 mg | ORAL_TABLET | Freq: Every day | ORAL | Status: DC
  Administered 2014-07-15: 100 mg via ORAL
  Filled 2014-07-15 (×2): qty 2

## 2014-07-15 MED ORDER — ONDANSETRON HCL 4 MG PO TABS: 4.0000 mg | ORAL_TABLET | Freq: Four times a day (QID) | ORAL | Status: DC | PRN

## 2014-07-15 MED ORDER — ROCURONIUM BROMIDE 100 MG/10ML IV SOLN
INTRAVENOUS | Status: AC
  Filled 2014-07-15: qty 1

## 2014-07-15 MED ORDER — ONDANSETRON HCL 4 MG/2ML IJ SOLN: 4.0000 mg | Freq: Four times a day (QID) | INTRAMUSCULAR | Status: DC | PRN

## 2014-07-15 MED ORDER — LIDOCAINE HCL (CARDIAC) 20 MG/ML IV SOLN
INTRAVENOUS | Status: AC
  Filled 2014-07-15: qty 5

## 2014-07-15 MED ORDER — PROPOFOL 10 MG/ML IV BOLUS
INTRAVENOUS | Status: AC
  Filled 2014-07-15: qty 20

## 2014-07-15 MED ORDER — HYDROCHLOROTHIAZIDE 25 MG PO TABS
25.0000 mg | ORAL_TABLET | Freq: Every day | ORAL | Status: DC
  Administered 2014-07-15: 25 mg via ORAL
  Filled 2014-07-15 (×2): qty 1

## 2014-07-15 MED ORDER — GLYCOPYRROLATE 0.2 MG/ML IJ SOLN
INTRAMUSCULAR | Status: DC | PRN
  Administered 2014-07-15: 0.2 mg via INTRAVENOUS
  Administered 2014-07-15: 0.6 mg via INTRAVENOUS

## 2014-07-15 MED ORDER — MECLIZINE HCL 25 MG PO TABS
25.0000 mg | ORAL_TABLET | Freq: Three times a day (TID) | ORAL | Status: DC | PRN
  Filled 2014-07-15: qty 1

## 2014-07-15 MED ORDER — ONDANSETRON HCL 4 MG/2ML IJ SOLN
INTRAMUSCULAR | Status: DC | PRN
  Administered 2014-07-15: 4 mg via INTRAVENOUS

## 2014-07-15 MED ORDER — FENTANYL CITRATE 0.05 MG/ML IJ SOLN
50.0000 ug | INTRAMUSCULAR | Status: DC | PRN
  Administered 2014-07-15: 50 ug via INTRAVENOUS

## 2014-07-15 MED ORDER — OXYCODONE HCL 5 MG/5ML PO SOLN
5.0000 mg | Freq: Once | ORAL | Status: DC | PRN
  Filled 2014-07-15: qty 5

## 2014-07-15 MED ORDER — MIDAZOLAM HCL 2 MG/2ML IJ SOLN
1.0000 mg | INTRAMUSCULAR | Status: DC | PRN
  Administered 2014-07-15: 1 mg via INTRAVENOUS

## 2014-07-15 MED ORDER — MIDAZOLAM HCL 2 MG/2ML IJ SOLN
INTRAMUSCULAR | Status: AC
  Filled 2014-07-15: qty 2

## 2014-07-15 MED ORDER — BUPIVACAINE-EPINEPHRINE (PF) 0.5% -1:200000 IJ SOLN
INTRAMUSCULAR | Status: AC
  Filled 2014-07-15: qty 30

## 2014-07-15 MED ORDER — LIDOCAINE HCL 1 % IJ SOLN
INTRAMUSCULAR | Status: DC | PRN
  Administered 2014-07-15: 60 mg via INTRADERMAL

## 2014-07-15 MED ORDER — FENTANYL CITRATE 0.05 MG/ML IJ SOLN
INTRAMUSCULAR | Status: DC | PRN
  Administered 2014-07-15 (×4): 25 ug via INTRAVENOUS

## 2014-07-15 MED ORDER — HYDROMORPHONE HCL 1 MG/ML IJ SOLN
0.2500 mg | INTRAMUSCULAR | Status: DC | PRN
  Administered 2014-07-15: 0.5 mg via INTRAVENOUS

## 2014-07-15 MED ORDER — NEOSTIGMINE METHYLSULFATE 10 MG/10ML IV SOLN
INTRAVENOUS | Status: DC | PRN
  Administered 2014-07-15: 4 mg via INTRAVENOUS

## 2014-07-15 MED ORDER — 0.9 % SODIUM CHLORIDE (POUR BTL) OPTIME
TOPICAL | Status: DC | PRN
  Administered 2014-07-15: 1000 mL

## 2014-07-15 MED ORDER — BUPIVACAINE HCL (PF) 0.5 % IJ SOLN
INTRAMUSCULAR | Status: AC
  Filled 2014-07-15: qty 30

## 2014-07-15 SURGICAL SUPPLY — 40 items
APL SKNCLS STERI-STRIP NONHPOA (GAUZE/BANDAGES/DRESSINGS)
APPLICATOR COTTON TIP 6IN STRL (MISCELLANEOUS) ×1 IMPLANT
BENZOIN TINCTURE PRP APPL 2/3 (GAUZE/BANDAGES/DRESSINGS) ×1 IMPLANT
BLADE HEX COATED 2.75 (ELECTRODE) ×1 IMPLANT
BLADE SURG 15 STRL LF DISP TIS (BLADE) ×1 IMPLANT
BLADE SURG 15 STRL SS (BLADE)
BLADE SURG SZ10 CARB STEEL (BLADE) ×2 IMPLANT
CLOSURE WOUND 1/2 X4 (GAUZE/BANDAGES/DRESSINGS)
DECANTER SPIKE VIAL GLASS SM (MISCELLANEOUS) ×1 IMPLANT
DRAIN PENROSE 18X1/2 LTX STRL (DRAIN) ×3 IMPLANT
DRAPE LAPAROTOMY TRNSV 102X78 (DRAPE) ×3 IMPLANT
ELECT REM PT RETURN 9FT ADLT (ELECTROSURGICAL) ×3
ELECTRODE REM PT RTRN 9FT ADLT (ELECTROSURGICAL) ×1 IMPLANT
GAUZE SPONGE 4X4 12PLY STRL (GAUZE/BANDAGES/DRESSINGS) ×1 IMPLANT
GLOVE BIOGEL PI IND STRL 6.5 (GLOVE) IMPLANT
GLOVE BIOGEL PI IND STRL 7.0 (GLOVE) ×1 IMPLANT
GLOVE BIOGEL PI INDICATOR 6.5 (GLOVE) ×2
GLOVE BIOGEL PI INDICATOR 7.0 (GLOVE) ×2
GLOVE SURG ORTHO 8.0 STRL STRW (GLOVE) ×3 IMPLANT
GOWN STRL REUS W/TWL LRG LVL3 (GOWN DISPOSABLE) ×1 IMPLANT
GOWN STRL REUS W/TWL XL LVL3 (GOWN DISPOSABLE) ×6 IMPLANT
KIT BASIN OR (CUSTOM PROCEDURE TRAY) ×3 IMPLANT
LIQUID BAND (GAUZE/BANDAGES/DRESSINGS) ×2 IMPLANT
MESH ULTRAPRO 3X6 7.6X15CM (Mesh General) ×2 IMPLANT
NDL HYPO 25X1 1.5 SAFETY (NEEDLE) ×1 IMPLANT
NEEDLE HYPO 25X1 1.5 SAFETY (NEEDLE) IMPLANT
NS IRRIG 1000ML POUR BTL (IV SOLUTION) ×3 IMPLANT
PACK BASIC VI WITH GOWN DISP (CUSTOM PROCEDURE TRAY) ×3 IMPLANT
PENCIL BUTTON HOLSTER BLD 10FT (ELECTRODE) ×3 IMPLANT
SPONGE LAP 4X18 X RAY DECT (DISPOSABLE) ×3 IMPLANT
STRIP CLOSURE SKIN 1/2X4 (GAUZE/BANDAGES/DRESSINGS) ×1 IMPLANT
SUT MNCRL AB 4-0 PS2 18 (SUTURE) ×3 IMPLANT
SUT NOVA 0 T19/GS 22DT (SUTURE) ×2 IMPLANT
SUT NOVA NAB GS-22 2 0 T19 (SUTURE) ×10 IMPLANT
SUT SILK 2 0 SH (SUTURE) ×1 IMPLANT
SUT VIC AB 3-0 SH 18 (SUTURE) ×3 IMPLANT
SYR BULB IRRIGATION 50ML (SYRINGE) ×1 IMPLANT
SYR CONTROL 10ML LL (SYRINGE) ×1 IMPLANT
TOWEL OR 17X26 10 PK STRL BLUE (TOWEL DISPOSABLE) ×3 IMPLANT
YANKAUER SUCT BULB TIP 10FT TU (MISCELLANEOUS) ×1 IMPLANT

## 2014-07-15 NOTE — Anesthesia Postprocedure Evaluation (Signed)
Anesthesia Post Note  Patient: Adam Briggs  Procedure(s) Performed: Procedure(s) (LRB): LEFT INGUINAL HERNIA REPAIR WITH MESH (Left) INSERTION OF MESH (Left)  Anesthesia type: general  Patient location: PACU  Post pain: Pain level controlled  Post assessment: Patient's Cardiovascular Status Stable  Last Vitals:  Filed Vitals:   07/15/14 1130  BP: 136/63  Pulse: 72  Temp: 36.7 C  Resp:     Post vital signs: Reviewed and stable  Level of consciousness: sedated  Complications: No apparent anesthesia complications

## 2014-07-15 NOTE — H&P (Signed)
General Surgery [facility]  Adam Briggs DOB: [birth year] Married / Language: English / Race: White Male  History of Present Illness   The patient is a 81 year old male who presents with an inguinal hernia. Patient is referred by Dr. Unice Cobble for evaluation of left inguinal hernia. Patient has had a left inguinal hernia for at least 10 years. It has gradually increased in size. It has always been reducible, although the patient has lately had to manipulate the hernia in order to get it to reduce. He has had no signs or symptoms of obstruction. He has had previous right inguinal hernia repair 30 years ago. He has had no other abdominal surgery. Patient now presents for left inguinal hernia repair.   Other Problems  Enlarged Prostate Gastroesophageal Reflux Disease Heart murmur High blood pressure Hypercholesterolemia Inguinal Hernia Other disease, cancer, significant illness Prostate Cancer  Past Surgical History  Colon Polyp Removal - Colonoscopy Colon Polyp Removal - Open Laparoscopic Inguinal Hernia Surgery Right.  Diagnostic Studies History  Colonoscopy 1-5 years ago  Allergies Lisinopril *ANTIHYPERTENSIVES*  Medication History AmLODIPine Besylate (5MG  Tablet, Oral) Active. Hydrochlorothiazide (25MG  Tablet, Oral) Active. Losartan Potassium (100MG  Tablet, Oral) Active. MetFORMIN HCl ER (500MG  Tablet ER 24HR, Oral) Active. Simvastatin (20MG  Tablet, Oral) Active.  Social History Caffeine use Coffee. No alcohol use No drug use Tobacco use Former smoker.  Family History  First Degree Relatives No pertinent family history  Review of Systems  General Not Present- Appetite Loss, Chills, Fatigue, Fever, Night Sweats, Weight Gain and Weight Loss. HEENT Present- Hearing Loss and Ringing in the Ears. Not Present- Earache, Hoarseness, Nose Bleed, Oral Ulcers, Seasonal Allergies, Sinus Pain, Sore Throat, Visual  Disturbances, Wears glasses/contact lenses and Yellow Eyes. Respiratory Not Present- Bloody sputum, Chronic Cough, Difficulty Breathing, Snoring and Wheezing. Breast Not Present- Breast Mass, Breast Pain, Nipple Discharge and Skin Changes. Cardiovascular Not Present- Chest Pain, Difficulty Breathing Lying Down, Leg Cramps, Palpitations, Rapid Heart Rate, Shortness of Breath and Swelling of Extremities. Gastrointestinal Not Present- Abdominal Pain, Bloating, Bloody Stool, Change in Bowel Habits, Chronic diarrhea, Constipation, Difficulty Swallowing, Excessive gas, Gets full quickly at meals, Hemorrhoids, Indigestion, Nausea, Rectal Pain and Vomiting. Male Genitourinary Present- Impotence. Not Present- Blood in Urine, Change in Urinary Stream, Frequency, Nocturia, Painful Urination, Urgency and Urine Leakage. Musculoskeletal Not Present- Back Pain, Joint Pain, Joint Stiffness, Muscle Pain, Muscle Weakness and Swelling of Extremities. Neurological Not Present- Decreased Memory, Fainting, Headaches, Numbness, Seizures, Tingling, Tremor, Trouble walking and Weakness. Psychiatric Not Present- Anxiety, Bipolar, Change in Sleep Pattern, Depression, Fearful and Frequent crying. Endocrine Not Present- Cold Intolerance, Excessive Hunger, Hair Changes, Heat Intolerance, Hot flashes and New Diabetes. Hematology Not Present- Easy Bruising, Excessive bleeding, Gland problems, HIV and Persistent Infections.   Vitals 05/30/2014 9:33 AM Weight: 179 lb Height: 69in Body Surface Area: 1.99 m Body Mass Index: 26.43 kg/m Temp.: 2F(Temporal)  Pulse: 78 (Regular)  BP: 134/74 (Sitting, Left Arm, Standard)    Physical Exam  General - appears comfortable, no distress; not diaphorectic  HEENT - normocephalic; sclerae clear, gaze conjugate; mucous membranes moist, dentition good; voice normal  Neck - symmetric on extension; no palpable anterior or posterior cervical adenopathy; no palpable masses in  the thyroid bed  Chest - clear bilaterally with rhonchi, rales, or wheeze  Cor - regular rhythm with normal rate; no significant murmur  Abd - soft without distension  GU - well-healed right groin incision; outpatient in the right inguinal canal with cough  and Valsalva shows no sign of recurrent hernia; obvious bulge in left groin; palpation reveals a large left inguinal hernia, likely indirect, extending into the left hemiscrotum; partially reducible in the standing position; nontender  Ext - non-tender without significant edema or lymphedema  Neuro - grossly intact; no tremor    Assessment & Plan  INGUINAL HERNIA, LEFT (550.90  K40.90)  The patient has a reducible left inguinal hernia. I discussed this with he and his wife in the office today. I provided them with written literature regarding hernia surgery to review at home.  I have recommended open left inguinal hernia repair with mesh. We have discussed the procedure. We have discussed the postoperative recovery and restrictions on his activities. We have discussed potential complications including urinary retention and the risk of recurrence. Given the patient's age and overall health, I have recommended that an overnight hospital stay following his procedure, especially since he lives out of town. We will make arrangements for his surgery in the near future.  The risks and benefits of the procedure have been discussed at length with the patient. The patient understands the proposed procedure, potential alternative treatments, and the course of recovery to be expected. All of the patient's questions have been answered at this time. The patient wishes to proceed with surgery.  Earnstine Regal, MD, [facility] Office: [phone]

## 2014-07-15 NOTE — Anesthesia Procedure Notes (Signed)
Anesthesia Regional Block:  TAP block  Pre-Anesthetic Checklist: ,, timeout performed, Correct Patient, Correct Site, Correct Laterality, Correct Procedure, Correct Position, site marked, Risks and benefits discussed,  Surgical consent,  Pre-op evaluation,  At surgeon's request and post-op pain management  Laterality: Left  Prep: chloraprep       Needles:  Injection technique: Single-shot  Needle Type: Echogenic Stimulator Needle     Needle Length: 10cm 10 cm Needle Gauge: 21 and 21 G    Additional Needles:  Procedures: ultrasound guided (picture in chart) TAP block Narrative:  Start time: 07/15/2014 9:38 AM End time: 07/15/2014 9:48 AM Injection made incrementally with aspirations every 5 mL.  Performed by: Personally

## 2014-07-15 NOTE — Progress Notes (Signed)
Assisted Dr. Tobias Alexander wih left TAPS block.. Side rails up, monitors on throughout procedure. See vital signs in flow sheet. Tolerated Procedure well.

## 2014-07-15 NOTE — Op Note (Signed)
Inguinal Hernia, Open, Procedure Note  Pre-operative Diagnosis:  Left inguinal hernia, reducible, indirect  Post-operative Diagnosis: same  Surgeon:  Earnstine Regal, MD, FACS  Anesthesia:  General  Preparation:  Chlora-prep  Estimated Blood Loss: Minimal  Complications:  none  Indications: The patient presented with a left, reducible hernia.    Procedure Details  The patient was evaluated in the holding area. All of the patient's questions were answered and the proposed procedure was confirmed. The site of the procedure was properly marked. The patient was taken to the Operating Room, identified by name, and the procedure verified as inguinal hernia repair.  The patient was placed in the supine position and underwent induction of anesthesia. A "Time Out" was performed per routine. The lower abdomen and groin were prepped and draped in the usual aseptic fashion.  After ascertaining that an adequate level of anesthesia had been obtained, an incision was made in the groin with a #10 blade.  Dissection was carried through the subcutaneous tissues and hemostasis obtained with the electrocautery.  A Gelpi retractor was placed for exposure.  The external oblique fascia was incised in line with it's fibers and extended through the external inguinal ring.  The cord structures were dissected out of the inguinal canal and encircled with a Penrose drain.  The floor of the inguinal canal was dissected out.  There was laxity of the floor, but no direct hernia defect.  The cord was explored and a large indirect hernia sac was present containing a loop of sigmoid colon.  This was reduced and the internal ring closed laterally with interrupted 0-Novafil sutures.  The floor of the inguinal canal was reconstructed with Ethicon Ultrapro mesh cut to the appropriate dimensions.  It was secured to the pubic tubercle with a 2-0 Novafil suture and along the inguinal ligament with a running 2-0 Novafil suture.  Mesh  was split to accommodate the cord structures.  The superior margin of the mesh was secured to the transversalis and internal oblique musculature with interrupted 2-0 Novafil sutures.  The tails of the mesh were overlapped lateral to the cord structures and secured to the inguinal ligament with interrupted 2-0 Novafil sutures to recreate the internal inguinal ring.  Cord structures were returned to the inguinal canal.  Local anesthetic was infiltrated throughout the field.  External oblique fascia was closed with interrupted 3-0 Vicryl sutures.  Subcutaneous tissues were closed with interrupted 3-0 Vicryl sutures.  Skin was anesthetized with local anesthetic, and the skin edges were re-approximated with a running 4-0 Monocryl suture.  Wound was washed and dried and Dermabond was applied.  Instrument, sponge, and needle counts were correct prior to closure and at the conclusion of the case.  The patient tolerated the procedure well.  The patient was awakened from anesthesia and brought to the recovery room in stable condition.  Earnstine Regal, MD, [facility] Office: [phone]

## 2014-07-15 NOTE — Discharge Instructions (Signed)
[facility]  HERNIA REPAIR POST OP INSTRUCTIONS  Always review your discharge instruction sheet given to you by the facility where your surgery was performed.  1. A  prescription for pain medication may be given to you upon discharge.  Take your pain medication as prescribed.  If narcotic pain medicine is not needed, then you may take acetaminophen (Tylenol) or ibuprofen (Advil) as needed.  2. Take your usually prescribed medications unless otherwise directed.  3. If you need a refill on your pain medication, please contact your pharmacy.  They will contact our office to request authorization. Prescriptions will not be filled after 5 pm daily or on weekends.  4. You should follow a light diet the first 24 hours after arrival home, such as soup and crackers or toast.  Be sure to include plenty of fluids daily.  Resume your normal diet the day after surgery.  5. Most patients will experience some swelling and bruising around the surgical site.  Ice packs and reclining will help.  Swelling and bruising can take several days to resolve.   6. It is common to experience some constipation if taking pain medication after surgery.  Increasing fluid intake and taking a stool softener (such as Colace) will usually help or prevent this problem from occurring.  A mild laxative (Milk of Magnesia or Miralax) should be taken according to package directions if there are no bowel movements after 48 hours.  7. Unless discharge instructions indicate otherwise, you may remove your bandages 24-48 hours after surgery, and you may shower at that time.  You may have steri-strips (small skin tapes) in place directly over the incision.  These strips should be left on the skin for 7-10 days.  If your surgeon used skin glue on the incision, you may shower in 24 hours.  The glue will flake off over the next 2-3 weeks.  Any sutures or staples will be removed at the office during your follow-up  visit.  8. ACTIVITIES:  You may resume regular (light) daily activities beginning the next day-such as daily self-care, walking, climbing stairs-gradually increasing activities as tolerated.  You may have sexual intercourse when it is comfortable.  Refrain from any heavy lifting or straining until approved by your doctor.  You may drive when you are no longer taking prescription pain medication, you can comfortably wear a seatbelt, and you can safely maneuver your car and apply brakes.  9. You should see your doctor in the office for a follow-up appointment approximately 2-3 weeks after your surgery.  Make sure that you call for this appointment within a day or two after you arrive home to insure a convenient appointment time. 10.   WHEN TO CALL YOUR DOCTOR: 1. Fever greater than 101.0 2. Inability to urinate 3. Persistent nausea and/or vomiting 4. Extreme swelling or bruising 5. Continued bleeding from incision 6. Increased pain, redness, or drainage from the incision  The clinic staff is available to answer your questions during regular business hours.  Please don't hesitate to call and ask to speak to one of the nurses for clinical concerns.  If you have a medical emergency, go to the nearest emergency room or call 911.  A surgeon from [facility] is always on call for the hospital.   [facility] [address], [address], [place], [place]  [address]  [phone] ? [phone] ? FAX [phone]  [email or web address]   

## 2014-07-15 NOTE — Transfer of Care (Signed)
Immediate Anesthesia Transfer of Care Note  Patient: Adam Briggs  Procedure(s) Performed: Procedure(s): LEFT INGUINAL HERNIA REPAIR WITH MESH (Left) INSERTION OF MESH (Left)  Patient Location: PACU  Anesthesia Type:General  Level of Consciousness: awake, alert  and oriented  Airway & Oxygen Therapy: Patient Spontanous Breathing and Patient connected to face mask oxygen  Post-op Assessment: Report given to RN and Post -op Vital signs reviewed and stable  Post vital signs: Reviewed and stable  Last Vitals:  Filed Vitals:   07/15/14 0945  BP: 139/65  Pulse: 82  Temp:   Resp: 15    Complications: No apparent anesthesia complications

## 2014-07-15 NOTE — Anesthesia Preprocedure Evaluation (Addendum)
Anesthesia Evaluation  Patient identified by MRN, date of birth, ID band Patient awake    Reviewed: Allergy & Precautions, NPO status , Patient's Chart, lab work & pertinent test results  History of Anesthesia Complications Negative for: history of anesthetic complications  Airway Mallampati: II  TM Distance: >3 FB Neck ROM: Full    Dental  (+) Edentulous Upper, Dental Advisory Given   Pulmonary former smoker,    Pulmonary exam normal       Cardiovascular hypertension,     Neuro/Psych negative neurological ROS  negative psych ROS   GI/Hepatic Neg liver ROS, GERD-  ,  Endo/Other  diabetes  Renal/GU negative Renal ROS     Musculoskeletal   Abdominal   Peds  Hematology negative hematology ROS (+)   Anesthesia Other Findings   Reproductive/Obstetrics                           Anesthesia Physical Anesthesia Plan  ASA: III  Anesthesia Plan: General   Post-op Pain Management:    Induction: Intravenous  Airway Management Planned: Oral ETT  Additional Equipment:   Intra-op Plan:   Post-operative Plan: Extubation in OR  Informed Consent: I have reviewed the patients History and Physical, chart, labs and discussed the procedure including the risks, benefits and alternatives for the proposed anesthesia with the patient or authorized representative who has indicated his/her understanding and acceptance.   Dental advisory given  Plan Discussed with: CRNA, Anesthesiologist and Surgeon  Anesthesia Plan Comments:        Anesthesia Quick Evaluation

## 2014-07-16 DIAGNOSIS — K4090 Unilateral inguinal hernia, without obstruction or gangrene, not specified as recurrent: Secondary | ICD-10-CM | POA: Diagnosis not present

## 2014-07-16 LAB — GLUCOSE, CAPILLARY: Glucose-Capillary: 173 mg/dL — ABNORMAL HIGH (ref 70–99)

## 2014-07-16 MED ORDER — HYDROCODONE-ACETAMINOPHEN 5-325 MG PO TABS: 1.0000 | ORAL_TABLET | ORAL | Status: DC | PRN

## 2014-07-16 NOTE — Progress Notes (Signed)
Utilization Review completed.  

## 2014-07-16 NOTE — Progress Notes (Signed)
Patient ID: Adam Briggs, male   DOB: Mar 14, 1930, 81 y.o.   MRN: [id number] 1 Day Post-Op  Subjective: No C/O.  Voiding OK  Objective: Vital signs in last 24 hours: Temp:  [97.6 F (36.4 C)-99 F (37.2 C)] 98.1 F (36.7 C) (02/27 0600) Pulse Rate:  [62-94] 94 (02/27 0600) Resp:  [9-21] 18 (02/27 0600) BP: (132-148)/(57-70) 135/66 mmHg (02/27 0600) SpO2:  [92 %-100 %] 96 % (02/27 0600) Weight:  [179 lb (81.194 kg)] 179 lb (81.194 kg) (02/27 0600) Last BM Date: 07/15/14  Intake/Output from previous day: 02/26 0701 - 02/27 0700 In: 2044.2 [I.V.:2044.2] Out: 1405 [Urine:1400; Blood:5] Intake/Output this shift: Total I/O In: 120 [P.O.:120] Out: 120 [Urine:120]  General appearance: alert, cooperative and no distress Incision/Wound: Clean and dry  Lab Results:  No results for input(s): WBC, HGB, HCT, PLT in the last 72 hours. BMET No results for input(s): NA, K, CL, CO2, GLUCOSE, BUN, CREATININE, CALCIUM in the last 72 hours.   Studies/Results: No results found.  Anti-infectives: Anti-infectives    Start     Dose/Rate Route Frequency Ordered Stop   07/15/14 0738  ceFAZolin (ANCEF) IVPB 2 g/50 mL premix     2 g 100 mL/hr over 30 Minutes Intravenous On call to O.R. 07/15/14 0738 07/15/14 1013      Assessment/Plan: s/p Procedure(s): LEFT INGUINAL HERNIA REPAIR WITH MESH INSERTION OF MESH Doing well.  OK for discharge      Temple Sporer T 07/16/2014

## 2014-07-18 ENCOUNTER — Encounter (HOSPITAL_COMMUNITY): Payer: Self-pay | Admitting: Surgery

## 2014-07-21 NOTE — Discharge Summary (Signed)
  Physician Discharge Summary [facility]  Patient ID: AMAURIS DEBOIS MRN: [id number] DOB/AGE: February 08, 1930 81 y.o.  Admit date: 07/15/2014 Discharge date: 07/16/2014  Admission Diagnoses:  [facility]  Discharge Diagnoses:  Principal Problem:   Inguinal hernia   Discharged Condition: good  Hospital Course: patient was admitted for observation after [facility] repair with mesh.  Post op course was uncomplicated.  Prepared for discharge on POD#1.  Consults: None  Treatments: surgery: left inguinal hernia repair with mesh  Discharge Exam: Blood pressure 135/66, pulse 94, temperature 98.1 F (36.7 C), temperature source Oral, resp. rate 18, height 5\' 10"  (1.778 m), weight 179 lb (81.194 kg), SpO2 96 %. See progress notes.  Disposition: Home  Discharge Instructions    Discharge patient    Complete by:  As directed             Medication List    TAKE these medications        amLODipine 5 MG tablet  Commonly known as:  NORVASC  TAKE ONE TABLET BY MOUTH ONCE DAILY     aspirin EC 81 MG tablet  Take 81 mg by mouth daily.     fexofenadine 180 MG tablet  Commonly known as:  ALLEGRA  Take 1 tablet (180 mg total) by mouth daily.     fluticasone 50 MCG/ACT nasal spray  Commonly known as:  FLONASE  Place 2 sprays into both nostrils daily.     hydrochlorothiazide 25 MG tablet  Commonly known as:  HYDRODIURIL  Take 1 tablet (25 mg total) by mouth daily.     HYDROcodone-acetaminophen 5-325 MG per tablet  Commonly known as:  NORCO/VICODIN  Take 1-2 tablets by mouth every 4 (four) hours as needed for moderate pain.     losartan 100 MG tablet  Commonly known as:  COZAAR  Take 1 tablet (100 mg total) by mouth daily.     meclizine 25 MG tablet  Commonly known as:  ANTIVERT  Take 1 tablet (25 mg total) by mouth 3 (three) times daily as needed for dizziness or nausea.     metFORMIN 500 MG 24 hr tablet  Commonly known as:  GLUCOPHAGE-XR  TAKE ONE TABLET BY MOUTH ONCE  DAILY WITH  BREAKFAST     oxyCODONE 5 MG immediate release tablet  Commonly known as:  Oxy IR/ROXICODONE  Take 1-2 tablets (5-10 mg total) by mouth every 4 (four) hours as needed for moderate pain.     simvastatin 20 MG tablet  Commonly known as:  ZOCOR  Take 1 tablet (20 mg total) by mouth daily at 6 PM.           Follow-up Information    Follow up with Earnstine Regal, MD. Schedule an appointment as soon as possible for a visit in 3 weeks.   Specialty:  General Surgery   Why:  For wound re-check   Contact information:   [place] [id number] [phone]       Earnstine Regal, MD, [facility] Office: [phone]   Signed: Earnstine Regal 07/21/2014, 4:46 PM

## 2014-11-14 ENCOUNTER — Other Ambulatory Visit: Payer: Self-pay

## 2014-12-19 ENCOUNTER — Other Ambulatory Visit: Payer: Self-pay | Admitting: Internal Medicine

## 2014-12-22 ENCOUNTER — Ambulatory Visit: Payer: Medicare Other | Admitting: Internal Medicine

## 2014-12-30 ENCOUNTER — Ambulatory Visit: Payer: Medicare Other | Admitting: Internal Medicine

## 2015-01-12 ENCOUNTER — Other Ambulatory Visit (INDEPENDENT_AMBULATORY_CARE_PROVIDER_SITE_OTHER): Payer: Medicare Other

## 2015-01-12 ENCOUNTER — Encounter: Payer: Self-pay | Admitting: Internal Medicine

## 2015-01-12 ENCOUNTER — Ambulatory Visit (INDEPENDENT_AMBULATORY_CARE_PROVIDER_SITE_OTHER): Payer: Medicare Other | Admitting: Internal Medicine

## 2015-01-12 VITALS — BP 130/68 | HR 74 | Temp 98.9°F | Resp 16 | Ht 70.0 in | Wt 172.8 lb

## 2015-01-12 DIAGNOSIS — E7800 Pure hypercholesterolemia, unspecified: Secondary | ICD-10-CM

## 2015-01-12 DIAGNOSIS — I10 Essential (primary) hypertension: Secondary | ICD-10-CM

## 2015-01-12 DIAGNOSIS — E119 Type 2 diabetes mellitus without complications: Secondary | ICD-10-CM | POA: Diagnosis not present

## 2015-01-12 DIAGNOSIS — Z23 Encounter for immunization: Secondary | ICD-10-CM

## 2015-01-12 LAB — LIPID PANEL
Cholesterol: 121 mg/dL (ref 0–200)
HDL: 34 mg/dL — AB (ref >39.00)
LDL Cholesterol: 65 mg/dL (ref 0–99)
NONHDL: 86.54
TRIGLYCERIDES: 110 mg/dL (ref 0.0–149.0)
Total CHOL/HDL Ratio: 4
VLDL: 22 mg/dL (ref 0.0–40.0)

## 2015-01-12 LAB — COMPREHENSIVE METABOLIC PANEL
ALT: 11 U/L (ref 0–53)
AST: 14 U/L (ref 0–37)
Albumin: 4.2 g/dL (ref 3.5–5.2)
Alkaline Phosphatase: 39 U/L (ref 39–117)
BILIRUBIN TOTAL: 0.8 mg/dL (ref 0.2–1.2)
BUN: 18 mg/dL (ref 6–23)
CALCIUM: 9.4 mg/dL (ref 8.4–10.5)
CO2: 28 mEq/L (ref 19–32)
Chloride: 104 mEq/L (ref 96–112)
Creatinine, Ser: 1.16 mg/dL (ref 0.40–1.50)
GFR: 63.81 mL/min (ref >60.00)
Glucose, Bld: 113 mg/dL — ABNORMAL HIGH (ref 70–99)
Potassium: 4 mEq/L (ref 3.5–5.1)
Sodium: 139 mEq/L (ref 135–145)
TOTAL PROTEIN: 6.8 g/dL (ref 6.0–8.3)

## 2015-01-12 LAB — HEMOGLOBIN A1C: Hgb A1c MFr Bld: 6.2 % (ref 4.6–6.5)

## 2015-01-12 NOTE — Progress Notes (Signed)
Pre visit review using our clinic review tool, if applicable. No additional management support is needed unless otherwise documented below in the visit note. 

## 2015-01-12 NOTE — Assessment & Plan Note (Signed)
Checking HgA1c, lipid panel. Continue metformin daily for now. Foot exam normal. On ARB without signs of complications. Adjust as needed.

## 2015-01-12 NOTE — Assessment & Plan Note (Signed)
Continue simvastatin and check lipid panel today. Adjust as indicated. Checking LFTs as well. No side effects noted.

## 2015-01-12 NOTE — Progress Notes (Signed)
   Subjective:    Patient ID: Adam Briggs, male    DOB: Sep 21, 1929, 81 y.o.   MRN: [id number]  HPI The patient is an 81 YO man coming in for follow up of his diabetes (well controlled, no complications, taking metformin and on ARB), hypertension (at goal, not complicated, on losartan, hctz, amlodipine), and his cholesterol (last at goal, not complicated, on simvastatin with no side effects). No new complaints and doing well overall. Active most days.   Review of Systems  Constitutional: Negative for fever, activity change, appetite change, fatigue and unexpected weight change.  HENT: Negative.   Eyes: Negative.   Respiratory: Negative for cough, chest tightness, shortness of breath and wheezing.   Cardiovascular: Negative for chest pain, palpitations and leg swelling.  Gastrointestinal: Negative for nausea, abdominal pain, diarrhea, constipation and abdominal distention.  Musculoskeletal: Negative.   Skin: Negative.   Neurological: Negative for dizziness, weakness, light-headedness and headaches.  Psychiatric/Behavioral: Negative.       Objective:   Physical Exam  Constitutional: He appears well-developed and well-nourished.  HENT:  Head: Normocephalic and atraumatic.  Eyes: EOM are normal.  Neck: Normal range of motion.  Cardiovascular: Normal rate and regular rhythm.   Murmur heard. Systolic, over the whole heart  Pulmonary/Chest: Effort normal and breath sounds normal. No respiratory distress. He has no wheezes. He has no rales.  Abdominal: Soft. Bowel sounds are normal. He exhibits no distension. There is no tenderness. There is no rebound.  Musculoskeletal: He exhibits no edema.  Neurological: Coordination normal.  Skin: Skin is warm and dry.   Filed Vitals:   01/12/15 0944  BP: 130/68  Pulse: 74  Temp: 98.9 F (37.2 C)  TempSrc: Oral  Resp: 16  Height: '5\' 10"'$  (1.778 m)  Weight: 172 lb 12.8 oz (78.382 kg)  SpO2: 96%      Assessment & Plan:  High dose flu shot  and prevnar 13 shots given at visit.

## 2015-01-12 NOTE — Assessment & Plan Note (Signed)
Controlled on amlodipine, losartan, hctz. Checking CMP today and adjust as indicated. No side effects of therapy.

## 2015-01-12 NOTE — Patient Instructions (Signed)
We have given you the flu shot and the pneumonia booster shot today.   We will check the blood work for the diabetes and the cholesterol and call you back with the results.   Keep up the good work with the health and I will see you back next year for your physical.

## 2015-03-09 ENCOUNTER — Encounter: Payer: Self-pay | Admitting: *Deleted

## 2015-03-16 ENCOUNTER — Other Ambulatory Visit: Payer: Self-pay | Admitting: Internal Medicine

## 2015-04-10 DIAGNOSIS — R972 Elevated prostate specific antigen [PSA]: Secondary | ICD-10-CM | POA: Diagnosis not present

## 2015-04-17 DIAGNOSIS — C61 Malignant neoplasm of prostate: Secondary | ICD-10-CM | POA: Diagnosis not present

## 2015-06-14 ENCOUNTER — Other Ambulatory Visit: Payer: Self-pay | Admitting: Internal Medicine

## 2015-07-14 ENCOUNTER — Other Ambulatory Visit (INDEPENDENT_AMBULATORY_CARE_PROVIDER_SITE_OTHER): Payer: Medicare Other

## 2015-07-14 ENCOUNTER — Ambulatory Visit (INDEPENDENT_AMBULATORY_CARE_PROVIDER_SITE_OTHER): Payer: Medicare Other | Admitting: Internal Medicine

## 2015-07-14 ENCOUNTER — Encounter: Payer: Self-pay | Admitting: Internal Medicine

## 2015-07-14 VITALS — BP 136/72 | HR 70 | Temp 98.8°F | Resp 14 | Ht 70.0 in | Wt 169.0 lb

## 2015-07-14 DIAGNOSIS — I10 Essential (primary) hypertension: Secondary | ICD-10-CM

## 2015-07-14 DIAGNOSIS — E119 Type 2 diabetes mellitus without complications: Secondary | ICD-10-CM | POA: Diagnosis not present

## 2015-07-14 LAB — HEMOGLOBIN A1C: Hgb A1c MFr Bld: 6.1 % (ref 4.6–6.5)

## 2015-07-14 NOTE — Assessment & Plan Note (Signed)
Taking metformin and losartan and statin and well controlled without complications. Checking HgA1c today and adjust as needed. Kidney function good.

## 2015-07-14 NOTE — Progress Notes (Signed)
   Subjective:    Patient ID: Bethann Goo, male    DOB: 14-Feb-1930, 82 y.o.   MRN: [id number]  HPI The patient is an 82 YO man coming in for follow up of his diabetes. Still taking his medicines without side effects. Staying active daily and outside now that the weather is good. No low sugars. No numbness or tingling in his hands or feet. No change to his vision. No wounds or sores.   Review of Systems  Constitutional: Negative for fever, activity change, appetite change, fatigue and unexpected weight change.  HENT: Negative.   Eyes: Negative.   Respiratory: Negative for cough, chest tightness, shortness of breath and wheezing.   Cardiovascular: Negative for chest pain, palpitations and leg swelling.  Gastrointestinal: Negative for nausea, abdominal pain, diarrhea, constipation and abdominal distention.  Musculoskeletal: Negative.   Skin: Negative.   Neurological: Negative for dizziness, weakness, light-headedness and headaches.  Psychiatric/Behavioral: Negative.       Objective:   Physical Exam  Constitutional: He appears well-developed and well-nourished.  HENT:  Head: Normocephalic and atraumatic.  Eyes: EOM are normal.  Neck: Normal range of motion.  Cardiovascular: Normal rate and regular rhythm.   Murmur heard. Systolic, over the whole heart  Pulmonary/Chest: Effort normal and breath sounds normal. No respiratory distress. He has no wheezes. He has no rales.  Abdominal: Soft. Bowel sounds are normal. He exhibits no distension. There is no tenderness. There is no rebound.  Musculoskeletal: He exhibits no edema.  Neurological: Coordination normal.  Skin: Skin is warm and dry.   Filed Vitals:   07/14/15 0942  BP: 136/72  Pulse: 70  Temp: 98.8 F (37.1 C)  TempSrc: Oral  Resp: 14  Height: '5\' 10"'$  (1.778 m)  Weight: 169 lb (76.658 kg)  SpO2: 98%      Assessment & Plan:

## 2015-07-14 NOTE — Patient Instructions (Signed)
We will check on the sugars today. We will call you with the results.   Keep up the good work with your health!  Diabetes and Standards of Medical Care Diabetes is complicated. You may find that your diabetes team includes a dietitian, nurse, diabetes educator, eye doctor, and more. To help everyone know what is going on and to help you get the care you deserve, the following schedule of care was developed to help keep you on track. Below are the tests, exams, vaccines, medicines, education, and plans you will need. HbA1c test This test shows how well you have controlled your glucose over the past 2-3 months. It is used to see if your diabetes management plan needs to be adjusted.   It is performed at least 2 times a year if you are meeting treatment goals.  It is performed 4 times a year if therapy has changed or if you are not meeting treatment goals. Blood pressure test  This test is performed at every routine medical visit. The goal is less than 140/90 mm Hg for most people, but 130/80 mm Hg in some cases. Ask your health care provider about your goal. Dental exam  Follow up with the dentist regularly. Eye exam  If you are diagnosed with type 1 diabetes as a child, get an exam upon reaching the age of 73 years or older and having had diabetes for 3-5 years. Yearly eye exams are recommended after that initial eye exam.  If you are diagnosed with type 1 diabetes as an adult, get an exam within 5 years of diagnosis and then yearly.  If you are diagnosed with type 2 diabetes, get an exam as soon as possible after the diagnosis and then yearly. Foot care exam  Visual foot exams are performed at every routine medical visit. The exams check for cuts, injuries, or other problems with the feet.  You should have a complete foot exam performed every year. This exam includes an inspection of the structure and skin of your feet, a check of the pulses in your feet, and a check of the sensation in  your feet.  Type 1 diabetes: The first exam is performed 5 years after diagnosis.  Type 2 diabetes: The first exam is performed at the time of diagnosis.  Check your feet nightly for cuts, injuries, or other problems with your feet. Tell your health care provider if anything is not healing. Kidney function test (urine microalbumin)  This test is performed once a year.  Type 1 diabetes: The first test is performed 5 years after diagnosis.  Type 2 diabetes: The first test is performed at the time of diagnosis.  A serum creatinine and estimated glomerular filtration rate (eGFR) test is done once a year to assess the level of chronic kidney disease (CKD), if present. Lipid profile (cholesterol, HDL, LDL, triglycerides)  Performed every 5 years for most people.  The goal for LDL is less than 100 mg/dL. If you are at high risk, the goal is less than 70 mg/dL.  The goal for HDL is 40 mg/dL-50 mg/dL for men and 50 mg/dL-60 mg/dL for women. An HDL cholesterol of 60 mg/dL or higher gives some protection against heart disease.  The goal for triglycerides is less than 150 mg/dL. Immunizations  The flu (influenza) vaccine is recommended yearly for every person 20 months of age or older who has diabetes.  The pneumonia (pneumococcal) vaccine is recommended for every person 32 years of age or older who  has diabetes. Adults 79 years of age or older may receive the pneumonia vaccine as a series of two separate shots.  The hepatitis B vaccine is recommended for adults shortly after they have been diagnosed with diabetes.  The Tdap (tetanus, diphtheria, and pertussis) vaccine should be given:  According to normal childhood vaccination schedules, for children.  Every 10 years, for adults who have diabetes. Diabetes self-management education  Education is recommended at diagnosis and ongoing as needed. Treatment plan  Your treatment plan is reviewed at every medical visit.   This information is  not intended to replace advice given to you by your health care provider. Make sure you discuss any questions you have with your health care provider.   Document Released: 03/03/2009 Document Revised: 05/27/2014 Document Reviewed: 10/06/2012 Elsevier Interactive Patient Education [employer].

## 2015-07-14 NOTE — Progress Notes (Signed)
Pre visit review using our clinic review tool, if applicable. No additional management support is needed unless otherwise documented below in the visit note. 

## 2015-07-14 NOTE — Assessment & Plan Note (Signed)
BP at goal on amlodipine, losartan, hctz. No side effects, no need for adjustment today.

## 2015-09-13 ENCOUNTER — Other Ambulatory Visit: Payer: Self-pay | Admitting: Internal Medicine

## 2015-12-11 ENCOUNTER — Other Ambulatory Visit: Payer: Self-pay | Admitting: Internal Medicine

## 2016-01-11 ENCOUNTER — Ambulatory Visit (INDEPENDENT_AMBULATORY_CARE_PROVIDER_SITE_OTHER): Payer: Medicare Other | Admitting: Internal Medicine

## 2016-01-11 ENCOUNTER — Other Ambulatory Visit (INDEPENDENT_AMBULATORY_CARE_PROVIDER_SITE_OTHER): Payer: Medicare Other

## 2016-01-11 ENCOUNTER — Encounter: Payer: Self-pay | Admitting: Internal Medicine

## 2016-01-11 VITALS — BP 148/86 | HR 71 | Temp 98.4°F | Resp 16 | Ht 70.0 in | Wt 174.0 lb

## 2016-01-11 DIAGNOSIS — E119 Type 2 diabetes mellitus without complications: Secondary | ICD-10-CM

## 2016-01-11 DIAGNOSIS — I10 Essential (primary) hypertension: Secondary | ICD-10-CM

## 2016-01-11 DIAGNOSIS — Z23 Encounter for immunization: Secondary | ICD-10-CM

## 2016-01-11 LAB — COMPREHENSIVE METABOLIC PANEL
ALBUMIN: 4.6 g/dL (ref 3.5–5.2)
ALK PHOS: 44 U/L (ref 39–117)
ALT: 15 U/L (ref 0–53)
AST: 14 U/L (ref 0–37)
BUN: 15 mg/dL (ref 6–23)
CO2: 29 mEq/L (ref 19–32)
CREATININE: 1.16 mg/dL (ref 0.40–1.50)
Calcium: 9.6 mg/dL (ref 8.4–10.5)
Chloride: 102 mEq/L (ref 96–112)
GFR: 63.66 mL/min (ref >60.00)
Glucose, Bld: 129 mg/dL — ABNORMAL HIGH (ref 70–99)
POTASSIUM: 4 meq/L (ref 3.5–5.1)
SODIUM: 138 meq/L (ref 135–145)
TOTAL PROTEIN: 7.1 g/dL (ref 6.0–8.3)
Total Bilirubin: 0.8 mg/dL (ref 0.2–1.2)

## 2016-01-11 LAB — LIPID PANEL
CHOLESTEROL: 145 mg/dL (ref 0–200)
HDL: 37.7 mg/dL — AB (ref >39.00)
LDL Cholesterol: 77 mg/dL (ref 0–99)
NonHDL: 106.95
Total CHOL/HDL Ratio: 4
Triglycerides: 149 mg/dL (ref 0.0–149.0)
VLDL: 29.8 mg/dL (ref 0.0–40.0)

## 2016-01-11 LAB — HEMOGLOBIN A1C: HEMOGLOBIN A1C: 6.5 % (ref 4.6–6.5)

## 2016-01-11 NOTE — Assessment & Plan Note (Signed)
BP at goal on hctz, losartan, amlodipine. Checking CMP and adjust as needed.

## 2016-01-11 NOTE — Assessment & Plan Note (Signed)
Checking HgA1c and lipid panel. Adjust as needed. Needs eye exam and reminded about that.

## 2016-01-11 NOTE — Progress Notes (Signed)
Pre visit review using our clinic review tool, if applicable. No additional management support is needed unless otherwise documented below in the visit note. 

## 2016-01-11 NOTE — Progress Notes (Signed)
   Subjective:    Patient ID: Adam Briggs, male    DOB: 08/12/1929, 82 y.o.   MRN: [id number]  HPI The patient is an 82 YO man coming in for follow up of his diabetes (taking metformin and losartan and simvastatin, not complicated, no low sugars) and with new congestion (going on for several years, has not tried anything for it, causes him to cough, not dripping down his nose but his throat). He is doing well otherwise.   Review of Systems  Constitutional: Negative for activity change, appetite change, fatigue, fever and unexpected weight change.  HENT: Positive for congestion and postnasal drip. Negative for sinus pressure, sore throat and trouble swallowing.   Respiratory: Positive for cough. Negative for chest tightness, shortness of breath and wheezing.   Cardiovascular: Negative for chest pain, palpitations and leg swelling.  Gastrointestinal: Negative for abdominal distention, abdominal pain, constipation, diarrhea and nausea.  Musculoskeletal: Negative.   Skin: Negative.   Neurological: Negative for dizziness, weakness, light-headedness and headaches.  Psychiatric/Behavioral: Negative.       Objective:   Physical Exam  Constitutional: He appears well-developed and well-nourished.  HENT:  Head: Normocephalic and atraumatic.  Eyes: EOM are normal.  Neck: Normal range of motion.  Cardiovascular: Normal rate and regular rhythm.   Murmur heard. Systolic, over the whole heart  Pulmonary/Chest: Effort normal and breath sounds normal. No respiratory distress. He has no wheezes. He has no rales.  Abdominal: Soft. Bowel sounds are normal. He exhibits no distension. There is no tenderness. There is no rebound.  Musculoskeletal: He exhibits no edema.  Neurological: Coordination normal.  Skin: Skin is warm and dry.   Vitals:   01/11/16 0932 01/11/16 1023  BP: (!) 152/92 (!) 148/86  Pulse: 71   Resp: 16   Temp: 98.4 F (36.9 C)   TempSrc: Oral   SpO2: 98%   Weight: 174 lb (78.9  kg)   Height: '5\' 10"'$  (1.778 m)       Assessment & Plan:  Flu shot given at visit.

## 2016-01-11 NOTE — Patient Instructions (Signed)
You can take zyrtec (also called cetirizine) for the coughing. Take 1 pill daily.   We are checking the labs today and have given you the flu shot today.

## 2016-03-08 ENCOUNTER — Other Ambulatory Visit: Payer: Self-pay | Admitting: Internal Medicine

## 2016-04-16 DIAGNOSIS — C61 Malignant neoplasm of prostate: Secondary | ICD-10-CM | POA: Diagnosis not present

## 2016-05-22 ENCOUNTER — Telehealth: Payer: Self-pay | Admitting: Internal Medicine

## 2016-05-22 NOTE — Telephone Encounter (Signed)
Attempted to call patient to schedule awv. Patient did not answer. Will try to call patient at a later time.

## 2016-06-10 ENCOUNTER — Other Ambulatory Visit: Payer: Self-pay | Admitting: Internal Medicine

## 2016-07-15 ENCOUNTER — Ambulatory Visit (INDEPENDENT_AMBULATORY_CARE_PROVIDER_SITE_OTHER): Payer: Medicare Other | Admitting: Internal Medicine

## 2016-07-15 ENCOUNTER — Encounter: Payer: Self-pay | Admitting: Internal Medicine

## 2016-07-15 ENCOUNTER — Other Ambulatory Visit (INDEPENDENT_AMBULATORY_CARE_PROVIDER_SITE_OTHER): Payer: Medicare Other

## 2016-07-15 VITALS — BP 144/86 | HR 68 | Temp 98.5°F | Ht 70.0 in | Wt 174.0 lb

## 2016-07-15 DIAGNOSIS — I10 Essential (primary) hypertension: Secondary | ICD-10-CM | POA: Diagnosis not present

## 2016-07-15 DIAGNOSIS — E119 Type 2 diabetes mellitus without complications: Secondary | ICD-10-CM

## 2016-07-15 DIAGNOSIS — E7800 Pure hypercholesterolemia, unspecified: Secondary | ICD-10-CM | POA: Diagnosis not present

## 2016-07-15 LAB — COMPREHENSIVE METABOLIC PANEL
ALBUMIN: 4.1 g/dL (ref 3.5–5.2)
ALK PHOS: 41 U/L (ref 39–117)
ALT: 8 U/L (ref 0–53)
AST: 9 U/L (ref 0–37)
BUN: 19 mg/dL (ref 6–23)
CALCIUM: 9.4 mg/dL (ref 8.4–10.5)
CHLORIDE: 106 meq/L (ref 96–112)
CO2: 26 mEq/L (ref 19–32)
CREATININE: 1.06 mg/dL (ref 0.40–1.50)
GFR: 70.55 mL/min (ref >60.00)
Glucose, Bld: 106 mg/dL — ABNORMAL HIGH (ref 70–99)
POTASSIUM: 3.7 meq/L (ref 3.5–5.1)
Sodium: 141 mEq/L (ref 135–145)
Total Bilirubin: 0.7 mg/dL (ref 0.2–1.2)
Total Protein: 6.5 g/dL (ref 6.0–8.3)

## 2016-07-15 LAB — HEMOGLOBIN A1C: Hgb A1c MFr Bld: 6.4 % (ref 4.6–6.5)

## 2016-07-15 NOTE — Patient Instructions (Signed)
We will check the labs today and call you back with the results.    Exercising to Stay Healthy Introduction Exercising regularly is important. It has many health benefits, such as:  Improving your overall fitness, flexibility, and endurance.  Increasing your bone density.  Helping with weight control.  Decreasing your body fat.  Increasing your muscle strength.  Reducing stress and tension.  Improving your overall health. In order to become healthy and stay healthy, it is recommended that you do moderate-intensity and vigorous-intensity exercise. You can tell that you are exercising at a moderate intensity if you have a higher heart rate and faster breathing, but you are still able to hold a conversation. You can tell that you are exercising at a vigorous intensity if you are breathing much harder and faster and cannot hold a conversation while exercising. How often should I exercise? Choose an activity that you enjoy and set realistic goals. Your health care provider can help you to make an activity plan that works for you. Exercise regularly as directed by your health care provider. This may include:  Doing resistance training twice each week, such as:  Push-ups.  Sit-ups.  Lifting weights.  Using resistance bands.  Doing a given intensity of exercise for a given amount of time. Choose from these options:  150 minutes of moderate-intensity exercise every week.  75 minutes of vigorous-intensity exercise every week.  A mix of moderate-intensity and vigorous-intensity exercise every week. Children, pregnant women, people who are out of shape, people who are overweight, and older adults may need to consult a health care provider for individual recommendations. If you have any sort of medical condition, be sure to consult your health care provider before starting a new exercise program. What are some exercise ideas? Some moderate-intensity exercise ideas include:  Walking at  a rate of 1 mile in 15 minutes.  Biking.  Hiking.  Golfing.  Dancing. Some vigorous-intensity exercise ideas include:  Walking at a rate of at least 4.5 miles per hour.  Jogging or running at a rate of 5 miles per hour.  Biking at a rate of at least 10 miles per hour.  Lap swimming.  Roller-skating or in-line skating.  Cross-country skiing.  Vigorous competitive sports, such as football, basketball, and soccer.  Jumping rope.  Aerobic dancing. What are some everyday activities that can help me to get exercise?  Yard work, such as:  Psychologist, educational.  Raking and bagging leaves.  Washing and waxing your car.  Pushing a stroller.  Shoveling snow.  Gardening.  Washing windows or floors. How can I be more active in my day-to-day activities?  Use the stairs instead of the elevator.  Take a walk during your lunch break.  If you drive, park your car farther away from work or school.  If you take public transportation, get off one stop early and walk the rest of the way.  Make all of your phone calls while standing up and walking around.  Get up, stretch, and walk around every 30 minutes throughout the day. What guidelines should I follow while exercising?  Do not exercise so much that you hurt yourself, feel dizzy, or get very short of breath.  Consult your health care provider before starting a new exercise program.  Wear comfortable clothes and shoes with good support.  Drink plenty of water while you exercise to prevent dehydration or heat stroke. Body water is lost during exercise and must be replaced.  Work out  until you breathe faster and your heart beats faster. This information is not intended to replace advice given to you by your health care provider. Make sure you discuss any questions you have with your health care provider. Document Released: 06/08/2010 Document Revised: 10/12/2015 Document Reviewed: 10/07/2013  2017 Elsevier

## 2016-07-15 NOTE — Assessment & Plan Note (Signed)
BP mildly elevated today but generally at goal on his amlodipine, hctz, losartan. He denies taking meds before visit due to travel but otherwise denies missing doses. Have them check BP at home and call with values and adjust if needed.

## 2016-07-15 NOTE — Assessment & Plan Note (Signed)
Checking HgA1c, last HgA1c at goal on metformin 500 mg daily. Eye exam up to date, foot exam done today. Adjust as needed. Not complicated at this time.

## 2016-07-15 NOTE — Progress Notes (Signed)
Pre visit review using our clinic review tool, if applicable. No additional management support is needed unless otherwise documented below in the visit note. 

## 2016-07-15 NOTE — Assessment & Plan Note (Signed)
Reviewed recent labs with them and LDL at goal with simvastatin 20 mg daily. No changes today.

## 2016-07-15 NOTE — Progress Notes (Signed)
   Subjective:    Patient ID: Adam Briggs, male    DOB: 1930-01-21, 83 y.o.   MRN: [id number]  HPI The patient is an 83 YO man coming in for follow up of his diabetes (taking metformin, well controlled, not complicated, on ARB and statin, no new numbness or pain in his feet, eye exam done in January), his blood pressure (BP mildly elevated today, previously at goal, taking amlodipine, hctz, losartan, denies chest pains, SOB, abdominal pain), and his hyperlipidemia (taking simvastatin 20 mg daily, denies muscle aches or pains, not complicated, denies chest pains or tightness, not exercising at this time). No new complaints.   Review of Systems  Constitutional: Negative.   HENT: Negative.   Eyes: Negative.   Respiratory: Negative for cough, chest tightness and shortness of breath.   Cardiovascular: Negative for chest pain, palpitations and leg swelling.  Gastrointestinal: Negative for abdominal distention, abdominal pain, constipation, diarrhea, nausea and vomiting.  Musculoskeletal: Negative.   Skin: Negative.   Neurological: Negative.   Psychiatric/Behavioral: Negative.       Objective:   Physical Exam  Constitutional: He is oriented to person, place, and time. He appears well-developed and well-nourished.  HENT:  Head: Normocephalic and atraumatic.  Eyes: EOM are normal.  Neck: Normal range of motion.  Cardiovascular: Normal rate and regular rhythm.   Pulmonary/Chest: Effort normal and breath sounds normal. No respiratory distress. He has no wheezes. He has no rales.  Abdominal: Soft. Bowel sounds are normal. He exhibits no distension. There is no tenderness. There is no rebound.  Musculoskeletal: He exhibits no edema.  Neurological: He is alert and oriented to person, place, and time. Coordination normal.  Skin: Skin is warm and dry.   Vitals:   07/15/16 0918  BP: (!) 144/86  Pulse: 68  Temp: 98.5 F (36.9 C)  TempSrc: Oral  SpO2: 98%  Weight: 174 lb (78.9 kg)  Height:  '5\' 10"'$  (1.778 m)      Assessment & Plan:

## 2016-09-06 ENCOUNTER — Other Ambulatory Visit: Payer: Self-pay | Admitting: Internal Medicine

## 2016-12-04 ENCOUNTER — Other Ambulatory Visit: Payer: Self-pay | Admitting: Internal Medicine

## 2016-12-13 ENCOUNTER — Other Ambulatory Visit: Payer: Self-pay

## 2016-12-19 ENCOUNTER — Ambulatory Visit: Payer: Medicare Other | Admitting: Internal Medicine

## 2017-01-14 ENCOUNTER — Ambulatory Visit: Payer: Medicare Other | Admitting: Internal Medicine

## 2017-02-10 ENCOUNTER — Ambulatory Visit (INDEPENDENT_AMBULATORY_CARE_PROVIDER_SITE_OTHER): Payer: Medicare Other | Admitting: Internal Medicine

## 2017-02-10 ENCOUNTER — Encounter: Payer: Self-pay | Admitting: Internal Medicine

## 2017-02-10 ENCOUNTER — Other Ambulatory Visit (INDEPENDENT_AMBULATORY_CARE_PROVIDER_SITE_OTHER): Payer: Medicare Other

## 2017-02-10 VITALS — BP 144/70 | HR 69 | Temp 98.1°F | Ht 70.0 in | Wt 176.0 lb

## 2017-02-10 DIAGNOSIS — E7800 Pure hypercholesterolemia, unspecified: Secondary | ICD-10-CM | POA: Diagnosis not present

## 2017-02-10 DIAGNOSIS — E119 Type 2 diabetes mellitus without complications: Secondary | ICD-10-CM

## 2017-02-10 DIAGNOSIS — Z23 Encounter for immunization: Secondary | ICD-10-CM

## 2017-02-10 DIAGNOSIS — Z0000 Encounter for general adult medical examination without abnormal findings: Secondary | ICD-10-CM | POA: Diagnosis not present

## 2017-02-10 DIAGNOSIS — I10 Essential (primary) hypertension: Secondary | ICD-10-CM

## 2017-02-10 DIAGNOSIS — E538 Deficiency of other specified B group vitamins: Secondary | ICD-10-CM

## 2017-02-10 LAB — CBC
HCT: 43.7 % (ref 39.0–52.0)
HEMOGLOBIN: 14.1 g/dL (ref 13.0–17.0)
MCHC: 32.2 g/dL (ref 30.0–36.0)
MCV: 89.2 fl (ref 78.0–100.0)
PLATELETS: 182 10*3/uL (ref 150.0–400.0)
RBC: 4.9 Mil/uL (ref 4.22–5.81)
RDW: 14.5 % (ref 11.5–15.5)
WBC: 6 10*3/uL (ref 4.0–10.5)

## 2017-02-10 LAB — COMPREHENSIVE METABOLIC PANEL
ALT: 8 U/L (ref 0–53)
AST: 10 U/L (ref 0–37)
Albumin: 4.4 g/dL (ref 3.5–5.2)
Alkaline Phosphatase: 41 U/L (ref 39–117)
BILIRUBIN TOTAL: 0.6 mg/dL (ref 0.2–1.2)
BUN: 22 mg/dL (ref 6–23)
CALCIUM: 9.7 mg/dL (ref 8.4–10.5)
CHLORIDE: 105 meq/L (ref 96–112)
CO2: 27 meq/L (ref 19–32)
Creatinine, Ser: 1.15 mg/dL (ref 0.40–1.50)
GFR: 64.13 mL/min (ref >60.00)
GLUCOSE: 183 mg/dL — AB (ref 70–99)
POTASSIUM: 3.6 meq/L (ref 3.5–5.1)
Sodium: 141 mEq/L (ref 135–145)
Total Protein: 6.9 g/dL (ref 6.0–8.3)

## 2017-02-10 LAB — HEMOGLOBIN A1C: Hgb A1c MFr Bld: 6.4 % (ref 4.6–6.5)

## 2017-02-10 LAB — LIPID PANEL
CHOL/HDL RATIO: 4
Cholesterol: 129 mg/dL (ref 0–200)
HDL: 34.8 mg/dL — AB (ref >39.00)
LDL CALC: 55 mg/dL (ref 0–99)
NONHDL: 94.02
TRIGLYCERIDES: 194 mg/dL — AB (ref 0.0–149.0)
VLDL: 38.8 mg/dL (ref 0.0–40.0)

## 2017-02-10 NOTE — Patient Instructions (Addendum)
We will check the labs today and call you back with the results.    Health Maintenance, Male A healthy lifestyle and preventive care is important for your health and wellness. Ask your health care provider about what schedule of regular examinations is right for you. What should I know about weight and diet? Eat a Healthy Diet  Eat plenty of vegetables, fruits, whole grains, low-fat dairy products, and lean protein.  Do not eat a lot of foods high in solid fats, added sugars, or salt.  Maintain a Healthy Weight Regular exercise can help you achieve or maintain a healthy weight. You should:  Do at least 150 minutes of exercise each week. The exercise should increase your heart rate and make you sweat (moderate-intensity exercise).  Do strength-training exercises at least twice a week.  Watch Your Levels of Cholesterol and Blood Lipids  Have your blood tested for lipids and cholesterol every 5 years starting at 81 years of age. If you are at high risk for heart disease, you should start having your blood tested when you are 81 years old. You may need to have your cholesterol levels checked more often if: ? Your lipid or cholesterol levels are high. ? You are older than 81 years of age. ? You are at high risk for heart disease.  What should I know about cancer screening? Many types of cancers can be detected early and may often be prevented. Lung Cancer  You should be screened every year for lung cancer if: ? You are a current smoker who has smoked for at least 30 years. ? You are a former smoker who has quit within the past 15 years.  Talk to your health care provider about your screening options, when you should start screening, and how often you should be screened.  Colorectal Cancer  Routine colorectal cancer screening usually begins at 81 years of age and should be repeated every 5-10 years until you are 81 years old. You may need to be screened more often if early forms of  precancerous polyps or small growths are found. Your health care provider may recommend screening at an earlier age if you have risk factors for colon cancer.  Your health care provider may recommend using home test kits to check for hidden blood in the stool.  A small camera at the end of a tube can be used to examine your colon (sigmoidoscopy or colonoscopy). This checks for the earliest forms of colorectal cancer.  Prostate and Testicular Cancer  Depending on your age and overall health, your health care provider may do certain tests to screen for prostate and testicular cancer.  Talk to your health care provider about any symptoms or concerns you have about testicular or prostate cancer.  Skin Cancer  Check your skin from head to toe regularly.  Tell your health care provider about any new moles or changes in moles, especially if: ? There is a change in a mole's size, shape, or color. ? You have a mole that is larger than a pencil eraser.  Always use sunscreen. Apply sunscreen liberally and repeat throughout the day.  Protect yourself by wearing long sleeves, pants, a wide-brimmed hat, and sunglasses when outside.  What should I know about heart disease, diabetes, and high blood pressure?  If you are 60-31 years of age, have your blood pressure checked every 3-5 years. If you are 28 years of age or older, have your blood pressure checked every year. You should have  your blood pressure measured twice-once when you are at a hospital or clinic, and once when you are not at a hospital or clinic. Record the average of the two measurements. To check your blood pressure when you are not at a hospital or clinic, you can use: ? An automated blood pressure machine at a pharmacy. ? A home blood pressure monitor.  Talk to your health care provider about your target blood pressure.  If you are between 90-25 years old, ask your health care provider if you should take aspirin to prevent heart  disease.  Have regular diabetes screenings by checking your fasting blood sugar level. ? If you are at a normal weight and have a low risk for diabetes, have this test once every three years after the age of 10. ? If you are overweight and have a high risk for diabetes, consider being tested at a younger age or more often.  A one-time screening for abdominal aortic aneurysm (AAA) by ultrasound is recommended for men aged 61-75 years who are current or former smokers. What should I know about preventing infection? Hepatitis B If you have a higher risk for hepatitis B, you should be screened for this virus. Talk with your health care provider to find out if you are at risk for hepatitis B infection. Hepatitis C Blood testing is recommended for:  Everyone born from [birth year] through 1963.  Anyone with known risk factors for hepatitis C.  Sexually Transmitted Diseases (STDs)  You should be screened each year for STDs including gonorrhea and chlamydia if: ? You are sexually active and are younger than 81 years of age. ? You are older than 81 years of age and your health care provider tells you that you are at risk for this type of infection. ? Your sexual activity has changed since you were last screened and you are at an increased risk for chlamydia or gonorrhea. Ask your health care provider if you are at risk.  Talk with your health care provider about whether you are at high risk of being infected with HIV. Your health care provider may recommend a prescription medicine to help prevent HIV infection.  What else can I do?  Schedule regular health, dental, and eye exams.  Stay current with your vaccines (immunizations).  Do not use any tobacco products, such as cigarettes, chewing tobacco, and e-cigarettes. If you need help quitting, ask your health care provider.  Limit alcohol intake to no more than 2 drinks per day. One drink equals 12 ounces of beer, 5 ounces of wine, or 1 ounces of  hard liquor.  Do not use street drugs.  Do not share needles.  Ask your health care provider for help if you need support or information about quitting drugs.  Tell your health care provider if you often feel depressed.  Tell your health care provider if you have ever been abused or do not feel safe at home. This information is not intended to replace advice given to you by your health care provider. Make sure you discuss any questions you have with your health care provider. Document Released: 11/02/2007 Document Revised: 01/03/2016 Document Reviewed: 02/07/2015 Elsevier Interactive Patient Education  [employer].

## 2017-02-10 NOTE — Progress Notes (Signed)
   Subjective:    Patient ID: Adam Briggs, male    DOB: April 14, 1930, 82 y.o.   MRN: [id number]  HPI HPI #2: The patient is an 83 YO man coming in for follow up on several medical problems including his diabetes (taking metformin, on ARB and statin as well, denies new numbness or weakness or tingling, no side effects), and his blood pressure (taking amlodipine, hctz, losartan, BP slightly above goal today but typically at goal at home, denies headaches or chest pains or SOB), and his cholesterol (taking simvastatin, denies side effects, no muscle aches, no CAD since last visit).   Here for medicare wellness, no new complaints. Please see A/P for status and treatment of chronic medical problems.   Diet: DM since diabetic Physical activity: sedentary Depression/mood screen: negative Hearing: intact to whispered voice, mild loss bilaterally but declines hearing exam Visual acuity: grossly normal, performs annual eye exam could not recall eye provider ADLs: capable Fall risk: none Home safety: good Cognitive evaluation: intact to orientation, naming, recall and repetition EOL planning: adv directives discussed  I have personally reviewed and have noted 1. The patient's medical and social history - reviewed today no changes 2. Their use of alcohol, tobacco or illicit drugs 3. Their current medications and supplements 4. The patient's functional ability including ADL's, fall risks, home safety risks and hearing or visual impairment. 5. Diet and physical activities 6. Evidence for depression or mood disorders 7. Care team reviewed and updated (available in snapshot)  Review of Systems  Constitutional: Negative.   HENT: Negative.   Eyes: Negative.   Respiratory: Negative for cough, chest tightness and shortness of breath.   Cardiovascular: Negative for chest pain, palpitations and leg swelling.  Gastrointestinal: Negative for abdominal distention, abdominal pain, constipation, diarrhea,  nausea and vomiting.  Musculoskeletal: Negative.   Skin: Negative.   Neurological: Negative.   Psychiatric/Behavioral: Negative.       Objective:   Physical Exam  Constitutional: He is oriented to person, place, and time. He appears well-developed and well-nourished.  HENT:  Head: Normocephalic and atraumatic.  Eyes: EOM are normal.  Neck: Normal range of motion.  Cardiovascular: Normal rate and regular rhythm.   Pulmonary/Chest: Effort normal and breath sounds normal. No respiratory distress. He has no wheezes. He has no rales.  Abdominal: Soft. Bowel sounds are normal. He exhibits no distension. There is no tenderness. There is no rebound.  Musculoskeletal: He exhibits no edema.  Neurological: He is alert and oriented to person, place, and time. Coordination abnormal.  Slow gait  Skin: Skin is warm and dry.  Psychiatric: He has a normal mood and affect.   Vitals:   02/10/17 1429  BP: (!) 144/70  Pulse: 69  Temp: 98.1 F (36.7 C)  TempSrc: Oral  SpO2: 99%  Weight: 176 lb (79.8 kg)  Height: 5\' 10"  (1.778 m)      Assessment & Plan:  Flu shot given at visit

## 2017-02-12 NOTE — Assessment & Plan Note (Signed)
Checking lipid panel and adjust simvastatin as needed for goal LDL <100.

## 2017-02-12 NOTE — Assessment & Plan Note (Addendum)
Taking losartan, hctz, amlodipine with BP at goal <150/90 and encouraged to work on more exercise to help bring it down some. Checking CMP and adjust as needed.

## 2017-02-12 NOTE — Assessment & Plan Note (Signed)
Flu shot given at visit, tetanus and pneumonia up to date. Counseled about shingrix. Counseled about sun safety and mole surveillance. Aged out of colonoscopy. Given 10 year screening recommendations.

## 2017-02-12 NOTE — Assessment & Plan Note (Signed)
Checking HgA1c and adjust metformin as needed. Not complicated. Reminded about the need for eye exam and to either complete or call office with the name of the eye provider who did their exam.

## 2017-02-21 ENCOUNTER — Telehealth: Payer: Self-pay | Admitting: Internal Medicine

## 2017-02-21 NOTE — Telephone Encounter (Signed)
Pt's daughter called stating that the pt told her that a message was left for him to call back. She was calling to see what it was about but I do not see her on the DPR. I saw there were some lab results but I did not know if that is what you called about. Please advise.  The pt can be reached at [phone]

## 2017-02-21 NOTE — Telephone Encounter (Signed)
Called patient back and gave Lab results

## 2017-03-10 ENCOUNTER — Other Ambulatory Visit: Payer: Self-pay | Admitting: Internal Medicine

## 2017-03-10 ENCOUNTER — Telehealth: Payer: Self-pay | Admitting: Family

## 2017-03-11 MED ORDER — SIMVASTATIN 20 MG PO TABS: ORAL_TABLET | ORAL | 3 refills | Status: DC

## 2017-03-11 MED ORDER — AMLODIPINE BESYLATE 5 MG PO TABS: 5.0000 mg | ORAL_TABLET | Freq: Every day | ORAL | 3 refills | Status: DC

## 2017-03-11 MED ORDER — METFORMIN HCL ER 500 MG PO TB24: ORAL_TABLET | ORAL | 3 refills | Status: DC

## 2017-03-11 MED ORDER — HYDROCHLOROTHIAZIDE 25 MG PO TABS: 25.0000 mg | ORAL_TABLET | Freq: Every day | ORAL | 3 refills | Status: DC

## 2017-03-11 NOTE — Telephone Encounter (Signed)
P daughter called and would like a refill on all of these medications, please advise patient is almost out of all of them

## 2017-03-11 NOTE — Telephone Encounter (Signed)
Reviewed chart pt is up-to-date sent refills to [facility].../lmb  

## 2017-03-11 NOTE — Addendum Note (Signed)
Addended by: Earnstine Regal on: 03/11/2017 09:59 AM   Modules accepted: Orders

## 2017-04-04 LAB — PSA: PSA: 0.036

## 2017-04-14 DIAGNOSIS — C61 Malignant neoplasm of prostate: Secondary | ICD-10-CM | POA: Diagnosis not present

## 2017-04-18 DIAGNOSIS — C61 Malignant neoplasm of prostate: Secondary | ICD-10-CM | POA: Diagnosis not present

## 2017-04-18 DIAGNOSIS — N320 Bladder-neck obstruction: Secondary | ICD-10-CM | POA: Diagnosis not present

## 2017-04-22 DIAGNOSIS — D225 Melanocytic nevi of trunk: Secondary | ICD-10-CM | POA: Diagnosis not present

## 2017-04-22 DIAGNOSIS — Z85828 Personal history of other malignant neoplasm of skin: Secondary | ICD-10-CM | POA: Diagnosis not present

## 2017-04-22 DIAGNOSIS — L821 Other seborrheic keratosis: Secondary | ICD-10-CM | POA: Diagnosis not present

## 2017-04-22 DIAGNOSIS — L280 Lichen simplex chronicus: Secondary | ICD-10-CM | POA: Diagnosis not present

## 2017-04-22 DIAGNOSIS — D0421 Carcinoma in situ of skin of right ear and external auricular canal: Secondary | ICD-10-CM | POA: Diagnosis not present

## 2017-04-22 DIAGNOSIS — C44319 Basal cell carcinoma of skin of other parts of face: Secondary | ICD-10-CM | POA: Diagnosis not present

## 2017-04-22 DIAGNOSIS — D485 Neoplasm of uncertain behavior of skin: Secondary | ICD-10-CM | POA: Diagnosis not present

## 2017-05-09 DIAGNOSIS — C44222 Squamous cell carcinoma of skin of right ear and external auricular canal: Secondary | ICD-10-CM | POA: Diagnosis not present

## 2017-05-09 DIAGNOSIS — Z85828 Personal history of other malignant neoplasm of skin: Secondary | ICD-10-CM | POA: Diagnosis not present

## 2017-08-11 DIAGNOSIS — D485 Neoplasm of uncertain behavior of skin: Secondary | ICD-10-CM | POA: Diagnosis not present

## 2017-08-11 DIAGNOSIS — Z85828 Personal history of other malignant neoplasm of skin: Secondary | ICD-10-CM | POA: Diagnosis not present

## 2017-08-14 ENCOUNTER — Ambulatory Visit: Payer: Medicare Other | Admitting: Internal Medicine

## 2017-08-14 DIAGNOSIS — Z0289 Encounter for other administrative examinations: Secondary | ICD-10-CM

## 2017-09-03 ENCOUNTER — Encounter: Payer: Self-pay | Admitting: Internal Medicine

## 2017-09-03 ENCOUNTER — Ambulatory Visit (INDEPENDENT_AMBULATORY_CARE_PROVIDER_SITE_OTHER): Payer: Medicare Other | Admitting: Internal Medicine

## 2017-09-03 ENCOUNTER — Other Ambulatory Visit (INDEPENDENT_AMBULATORY_CARE_PROVIDER_SITE_OTHER): Payer: Medicare Other

## 2017-09-03 VITALS — BP 120/82 | HR 80 | Temp 98.0°F | Ht 70.0 in | Wt 178.0 lb

## 2017-09-03 DIAGNOSIS — E119 Type 2 diabetes mellitus without complications: Secondary | ICD-10-CM | POA: Diagnosis not present

## 2017-09-03 DIAGNOSIS — E1169 Type 2 diabetes mellitus with other specified complication: Secondary | ICD-10-CM

## 2017-09-03 DIAGNOSIS — E785 Hyperlipidemia, unspecified: Secondary | ICD-10-CM

## 2017-09-03 LAB — COMPREHENSIVE METABOLIC PANEL
ALBUMIN: 4.2 g/dL (ref 3.5–5.2)
ALT: 7 U/L (ref 0–53)
AST: 8 U/L (ref 0–37)
Alkaline Phosphatase: 41 U/L (ref 39–117)
BUN: 15 mg/dL (ref 6–23)
CHLORIDE: 103 meq/L (ref 96–112)
CO2: 28 meq/L (ref 19–32)
CREATININE: 1.02 mg/dL (ref 0.40–1.50)
Calcium: 9.4 mg/dL (ref 8.4–10.5)
GFR: 73.56 mL/min (ref >60.00)
Glucose, Bld: 115 mg/dL — ABNORMAL HIGH (ref 70–99)
Potassium: 3.7 mEq/L (ref 3.5–5.1)
SODIUM: 139 meq/L (ref 135–145)
Total Bilirubin: 1 mg/dL (ref 0.2–1.2)
Total Protein: 6.7 g/dL (ref 6.0–8.3)

## 2017-09-03 LAB — HEMOGLOBIN A1C: HEMOGLOBIN A1C: 6.5 % (ref 4.6–6.5)

## 2017-09-03 NOTE — Assessment & Plan Note (Signed)
Checking HgA1c today and adjust as needed. Taking metformin which we will continue. Foot exam done today. Gets yearly eye exam. Adjust as needed. Complicated by hyperlipidemia and cataracts.

## 2017-09-03 NOTE — Progress Notes (Signed)
   Subjective:    Patient ID: Adam Briggs, male    DOB: 1930-03-09, 84 y.o.   MRN: [id number]  HPI The patient is an 84 YO man coming in for follow up of his diabetes. Still taking metformin. Taking ARB and statin as well. Denies high sugars or low sugars. Denies numbness or pains in his feet or hands. Denies ulcers or sores. Diet is about the same and activity level is the same.   Review of Systems  Constitutional: Negative.   Respiratory: Negative for cough, chest tightness and shortness of breath.   Cardiovascular: Negative for chest pain, palpitations and leg swelling.  Gastrointestinal: Negative for abdominal distention, abdominal pain, constipation, diarrhea, nausea and vomiting.  Musculoskeletal: Negative.   Skin: Negative.   Neurological: Negative.   Psychiatric/Behavioral: Negative.       Objective:   Physical Exam  Constitutional: He is oriented to person, place, and time. He appears well-developed and well-nourished.  HENT:  Head: Normocephalic and atraumatic.  Eyes: EOM are normal.  Neck: Normal range of motion.  Cardiovascular: Normal rate and regular rhythm.  Pulmonary/Chest: Effort normal and breath sounds normal. No respiratory distress. He has no wheezes. He has no rales.  Abdominal: Soft. He exhibits no distension. There is no tenderness. There is no rebound.  Musculoskeletal: He exhibits no edema.  Neurological: He is alert and oriented to person, place, and time. Coordination normal.  Skin: Skin is warm and dry.  Foot exam done  Psychiatric: He has a normal mood and affect.   Vitals:   09/03/17 0948  BP: 120/82  Pulse: 80  Temp: 98 F (36.7 C)  TempSrc: Oral  SpO2: 96%  Weight: 178 lb (80.7 kg)  Height: 5\' 10"  (1.778 m)      Assessment & Plan:

## 2017-09-03 NOTE — Patient Instructions (Signed)
We have sent in the nose spray to use 2 sprays at night time in each nostril to help.   We are checking the labs today and call you back about the results.

## 2017-09-04 ENCOUNTER — Telehealth: Payer: Self-pay

## 2017-09-04 MED ORDER — FLUTICASONE PROPIONATE 50 MCG/ACT NA SUSP: 2.0000 | Freq: Every day | NASAL | 6 refills | Status: DC

## 2017-09-04 NOTE — Telephone Encounter (Signed)
Patient's wife informed

## 2017-09-04 NOTE — Telephone Encounter (Signed)
Sent in

## 2017-09-04 NOTE — Telephone Encounter (Signed)
Copied from [place] [id number]. Topic: General - Other >> Sep 04, 2017  9:27 AM Carolyn Stare wrote:  Pt call to say Dr Sharlet Salina was suppose to call in nose spray and he has checked with the pharmacy and nothing is there.     Pharmacy [facility] [place]

## 2017-10-09 ENCOUNTER — Telehealth: Payer: Self-pay | Admitting: Emergency Medicine

## 2017-10-09 NOTE — Telephone Encounter (Signed)
Called patient to schedule AWV. Patient declined at this time. 

## 2018-03-12 ENCOUNTER — Ambulatory Visit: Payer: Medicare Other

## 2018-03-16 ENCOUNTER — Other Ambulatory Visit: Payer: Self-pay | Admitting: Internal Medicine

## 2018-03-20 ENCOUNTER — Ambulatory Visit (INDEPENDENT_AMBULATORY_CARE_PROVIDER_SITE_OTHER): Payer: Medicare Other | Admitting: *Deleted

## 2018-03-20 DIAGNOSIS — Z23 Encounter for immunization: Secondary | ICD-10-CM | POA: Diagnosis not present

## 2018-04-27 DIAGNOSIS — C61 Malignant neoplasm of prostate: Secondary | ICD-10-CM | POA: Diagnosis not present

## 2018-04-27 DIAGNOSIS — N320 Bladder-neck obstruction: Secondary | ICD-10-CM | POA: Diagnosis not present

## 2018-04-27 DIAGNOSIS — Z8546 Personal history of malignant neoplasm of prostate: Secondary | ICD-10-CM | POA: Diagnosis not present

## 2018-04-29 DIAGNOSIS — Z85828 Personal history of other malignant neoplasm of skin: Secondary | ICD-10-CM | POA: Diagnosis not present

## 2018-04-29 DIAGNOSIS — L821 Other seborrheic keratosis: Secondary | ICD-10-CM | POA: Diagnosis not present

## 2018-04-29 DIAGNOSIS — L570 Actinic keratosis: Secondary | ICD-10-CM | POA: Diagnosis not present

## 2018-05-12 ENCOUNTER — Encounter: Payer: Self-pay | Admitting: Internal Medicine

## 2018-05-12 NOTE — Progress Notes (Signed)
Abstracted and sent to scan  

## 2018-08-03 ENCOUNTER — Telehealth: Payer: Self-pay

## 2018-08-03 NOTE — Telephone Encounter (Signed)
Copied from [place] [id number]. Topic: General - Other >> Aug 03, 2018  3:20 PM Carolyn Stare wrote:  Pt daughter call pt has an appt in 09/07/2018 and she does not want to bring him in because of what is going on and is asking if Dr Sharlet Salina will refill his meds with out him coming to this app

## 2018-08-04 ENCOUNTER — Other Ambulatory Visit: Payer: Self-pay

## 2018-08-04 MED ORDER — HYDROCHLOROTHIAZIDE 25 MG PO TABS: 25.0000 mg | ORAL_TABLET | Freq: Every day | ORAL | 1 refills | Status: DC

## 2018-08-04 MED ORDER — METFORMIN HCL ER 500 MG PO TB24: 500.0000 mg | ORAL_TABLET | Freq: Every day | ORAL | 1 refills | Status: DC

## 2018-08-04 MED ORDER — LOSARTAN POTASSIUM 100 MG PO TABS: 100.0000 mg | ORAL_TABLET | Freq: Every day | ORAL | 1 refills | Status: DC

## 2018-08-04 MED ORDER — FLUTICASONE PROPIONATE 50 MCG/ACT NA SUSP: 2.0000 | Freq: Every day | NASAL | 6 refills | Status: AC

## 2018-08-04 MED ORDER — AMLODIPINE BESYLATE 5 MG PO TABS: 5.0000 mg | ORAL_TABLET | Freq: Every day | ORAL | 1 refills | Status: DC

## 2018-08-04 MED ORDER — SIMVASTATIN 20 MG PO TABS: ORAL_TABLET | ORAL | 1 refills | Status: DC

## 2018-08-04 NOTE — Addendum Note (Signed)
Addended by: Raford Pitcher R on: 08/04/2018 01:20 PM   Modules accepted: Orders

## 2018-08-04 NOTE — Telephone Encounter (Signed)
Called daughter back and informed her of MD response. Daughter was very appreciative and wanted to inform us that when she talked to the lady on the phone she was told that he father had to come in, in order to get his meds. Daughter felt like the lady was just not listening to her in regard that her father is old and should not be going to doctors office if he is not having issues. Patient felt she had to tell the lady to send the note to our office instead of that just being how the conversation should of went in the beginning. Daughter states that the lady was not mean just did not seem to listen or be compassionate.   MEDs are being sent in for patient and daughter will call back and asses the situation closer to rescheduled appointment.

## 2018-08-04 NOTE — Addendum Note (Signed)
Addended by: Raford Pitcher R on: 08/04/2018 04:45 PM   Modules accepted: Orders

## 2018-08-04 NOTE — Telephone Encounter (Signed)
Yes, recommend to delay apt several months.

## 2018-08-04 NOTE — Addendum Note (Signed)
Addended by: Raford Pitcher R on: 08/04/2018 02:59 PM   Modules accepted: Orders

## 2018-09-07 ENCOUNTER — Ambulatory Visit: Payer: Medicare Other | Admitting: Internal Medicine

## 2018-10-15 ENCOUNTER — Ambulatory Visit: Payer: Medicare Other | Admitting: Internal Medicine

## 2018-11-10 ENCOUNTER — Telehealth: Payer: Self-pay | Admitting: Internal Medicine

## 2018-11-10 NOTE — Telephone Encounter (Signed)
If he wishes to stop needs visit 3 months after stopping so we can monitor his sugars.

## 2018-11-10 NOTE — Telephone Encounter (Signed)
Patient would like to know what does he need to do since his medication Metformin has been on recalled and he wishes not to continue to take medication. He would like to talk to Dr. Clearence Cheek or her CMA about this, please call patient back, thanks.

## 2018-11-10 NOTE — Telephone Encounter (Signed)
Would you like patient to make a visit to discuss?

## 2018-11-10 NOTE — Telephone Encounter (Signed)
Patient informed of MD response and stated understanding will also discuss at visit on the 2nd of July

## 2018-11-19 ENCOUNTER — Ambulatory Visit (INDEPENDENT_AMBULATORY_CARE_PROVIDER_SITE_OTHER): Payer: Medicare Other | Admitting: Internal Medicine

## 2018-11-19 ENCOUNTER — Encounter: Payer: Self-pay | Admitting: Internal Medicine

## 2018-11-19 ENCOUNTER — Other Ambulatory Visit: Payer: Self-pay

## 2018-11-19 ENCOUNTER — Other Ambulatory Visit (INDEPENDENT_AMBULATORY_CARE_PROVIDER_SITE_OTHER): Payer: Medicare Other

## 2018-11-19 VITALS — BP 138/80 | HR 106 | Temp 98.3°F | Ht 70.0 in | Wt 170.0 lb

## 2018-11-19 DIAGNOSIS — I10 Essential (primary) hypertension: Secondary | ICD-10-CM

## 2018-11-19 DIAGNOSIS — E785 Hyperlipidemia, unspecified: Secondary | ICD-10-CM | POA: Diagnosis not present

## 2018-11-19 DIAGNOSIS — Z0000 Encounter for general adult medical examination without abnormal findings: Secondary | ICD-10-CM | POA: Diagnosis not present

## 2018-11-19 DIAGNOSIS — E1169 Type 2 diabetes mellitus with other specified complication: Secondary | ICD-10-CM

## 2018-11-19 DIAGNOSIS — E538 Deficiency of other specified B group vitamins: Secondary | ICD-10-CM

## 2018-11-19 LAB — LIPID PANEL
Cholesterol: 132 mg/dL (ref 0–200)
HDL: 36.3 mg/dL — ABNORMAL LOW (ref >39.00)
LDL Cholesterol: 71 mg/dL (ref 0–99)
NonHDL: 95.5
Total CHOL/HDL Ratio: 4
Triglycerides: 121 mg/dL (ref 0.0–149.0)
VLDL: 24.2 mg/dL (ref 0.0–40.0)

## 2018-11-19 LAB — COMPREHENSIVE METABOLIC PANEL
ALT: 8 U/L (ref 0–53)
AST: 9 U/L (ref 0–37)
Albumin: 4.3 g/dL (ref 3.5–5.2)
Alkaline Phosphatase: 43 U/L (ref 39–117)
BUN: 26 mg/dL — ABNORMAL HIGH (ref 6–23)
CO2: 30 mEq/L (ref 19–32)
Calcium: 9.4 mg/dL (ref 8.4–10.5)
Chloride: 103 mEq/L (ref 96–112)
Creatinine, Ser: 1.29 mg/dL (ref 0.40–1.50)
GFR: 52.63 mL/min — ABNORMAL LOW (ref >60.00)
Glucose, Bld: 110 mg/dL — ABNORMAL HIGH (ref 70–99)
Potassium: 4 mEq/L (ref 3.5–5.1)
Sodium: 140 mEq/L (ref 135–145)
Total Bilirubin: 0.8 mg/dL (ref 0.2–1.2)
Total Protein: 6.6 g/dL (ref 6.0–8.3)

## 2018-11-19 LAB — CBC
HCT: 43.6 % (ref 39.0–52.0)
Hemoglobin: 14.4 g/dL (ref 13.0–17.0)
MCHC: 33.1 g/dL (ref 30.0–36.0)
MCV: 88.8 fl (ref 78.0–100.0)
Platelets: 157 10*3/uL (ref 150.0–400.0)
RBC: 4.91 Mil/uL (ref 4.22–5.81)
RDW: 14.6 % (ref 11.5–15.5)
WBC: 4.5 10*3/uL (ref 4.0–10.5)

## 2018-11-19 LAB — HEMOGLOBIN A1C: Hgb A1c MFr Bld: 6 % (ref 4.6–6.5)

## 2018-11-19 LAB — VITAMIN B12: Vitamin B-12: 469 pg/mL (ref 211–911)

## 2018-11-19 LAB — TSH: TSH: 2.85 u[IU]/mL (ref 0.35–4.50)

## 2018-11-19 NOTE — Progress Notes (Signed)
Subjective:   Patient ID: Adam Briggs, male    DOB: 04-22-1930, 85 y.o.   MRN: [id number]  HPI Here for medicare wellness, no new complaints. Please see A/P for status and treatment of chronic medical problems.   HPI #2: Here for follow up blood pressure (taking amlodipine and hctz and losartan, denies chest pains or headaches, BP at goal at home, denies side effects to medication) and diabetes (off metformin since recall due to previously well controlled, does have some new numbness in his fingers, denies numbness or tingling in the feet, denies change in diet or weight, sugar 100 and 77 last two fasting readings at home, denies low sugars) and cholesterol (taking simvastatin, denies side effects, denies heart attack or stroke symptoms).   Diet: DM since diabetic Physical activity: sedentary Depression/mood screen: negative Hearing: intact to whispered voice Visual acuity: grossly normal, performs annual eye exam late for year due to covid-19 ADLs: capable Fall risk: none Home safety: good Cognitive evaluation: intact to orientation, naming, recall and repetition EOL planning: adv directives discussed    Office Visit from 11/19/2018 in [place]  PHQ-2 Total Score  0      I have personally reviewed and have noted 1. The patient's medical and social history - reviewed today no changes 2. Their use of alcohol, tobacco or illicit drugs 3. Their current medications and supplements 4. The patient's functional ability including ADL's, fall risks, home safety risks and hearing or visual impairment. 5. Diet and physical activities 6. Evidence for depression or mood disorders 7. Care team reviewed and updated  Patient Care Team: [name], MD as PCP - General (Internal Medicine) Renato Shin, MD (Endocrinology) Irene Shipper, MD (Gastroenterology) Carolan Clines, MD (Inactive) (Urology) Minus Breeding, MD (Cardiology) Past Medical  History:  Diagnosis Date  . BENIGN PROSTATIC HYPERTROPHY   . Cataract   . DIVERTICULOSIS, COLON   . DM   . GERD   . Heart murmur   . Hernia   . HYPERCHOLESTEROLEMIA   . HYPERTENSION   . Moderate aortic stenosis 10/30/2011   echo 2013  . Prostate cancer [facility])    s/p seed implant   Past Surgical History:  Procedure Laterality Date  . CATARACT EXTRACTION W/ INTRAOCULAR LENS IMPLANT  02/2013   R, then L 4 weeks later - [place]  . INGUINAL HERNIA REPAIR     Right  . INGUINAL HERNIA REPAIR Left 07/15/2014   Procedure: LEFT INGUINAL HERNIA REPAIR WITH MESH;  Surgeon: Armandina Gemma, MD;  Location: [facility];  Service: General;  Laterality: Left;  . INSERTION OF MESH Left 07/15/2014   Procedure: INSERTION OF MESH;  Surgeon: Armandina Gemma, MD;  Location: [facility];  Service: General;  Laterality: Left;  . RADIOACTIVE SEED IMPLANT     Family History  Problem Relation Age of Onset  . Colon cancer Neg Hx     Review of Systems  Constitutional: Negative.   HENT: Negative.   Eyes: Negative.   Respiratory: Negative for cough, chest tightness and shortness of breath.   Cardiovascular: Negative for chest pain, palpitations and leg swelling.  Gastrointestinal: Negative for abdominal distention, abdominal pain, constipation, diarrhea, nausea and vomiting.  Musculoskeletal: Negative.   Skin: Negative.   Neurological: Negative.   Psychiatric/Behavioral: Negative.     Objective:  Physical Exam Constitutional:      Appearance: He is well-developed.  HENT:     Head: Normocephalic and atraumatic.  Neck:  Musculoskeletal: Normal range of motion.  Cardiovascular:     Rate and Rhythm: Normal rate and regular rhythm.     Heart sounds: Murmur present.     Comments: stable Pulmonary:     Effort: Pulmonary effort is normal. No respiratory distress.     Breath sounds: Normal breath sounds. No wheezing or rales.  Abdominal:     General: Bowel sounds are normal. There is no distension.      Palpations: Abdomen is soft.     Tenderness: There is no abdominal tenderness. There is no rebound.  Skin:    General: Skin is warm and dry.     Comments: Foot exam done  Neurological:     Mental Status: He is alert and oriented to person, place, and time.     Coordination: Coordination normal.     Vitals:   11/19/18 0846  BP: 138/80  Pulse: (!) 106  Temp: 98.3 F (36.8 C)  TempSrc: Oral  SpO2: 98%  Weight: 170 lb (77.1 kg)  Height: 5\' 10"  (1.778 m)    Assessment & Plan:

## 2018-11-19 NOTE — Assessment & Plan Note (Signed)
Flu shot counseled. Pneumonia complete. Shingrix counseled. Tetanus due 2024. Colonoscopy aged out. Counseled about sun safety and mole surveillance. Counseled about the dangers of distracted driving. Given 10 year screening recommendations.

## 2018-11-19 NOTE — Assessment & Plan Note (Signed)
Checking lipid panel and adjust simvastatin 20 mg daily. LDL goal <10.

## 2018-11-19 NOTE — Assessment & Plan Note (Signed)
Checking CMP and adjust as needed amlodipine, losartan, hctz. BP at goal.

## 2018-11-19 NOTE — Assessment & Plan Note (Signed)
Checking HgA1c, foot exam done. Reminded about eye exam. Off meds currently. Taking statin and ARB.

## 2018-11-19 NOTE — Patient Instructions (Signed)

## 2019-03-03 ENCOUNTER — Telehealth: Payer: Self-pay | Admitting: Internal Medicine

## 2019-03-03 DIAGNOSIS — E1169 Type 2 diabetes mellitus with other specified complication: Secondary | ICD-10-CM

## 2019-03-03 NOTE — Telephone Encounter (Addendum)
Caller name: [name] Relation to pt: daughter Call back number:[phone] Pharmacy: [facility] [address] [place] [phone] ([phone] (Fax)     Reason for call:  Daughter states since metformin was recall an alternate was not sent in and daughter would Rx sent by Friday, please advise

## 2019-03-04 NOTE — Telephone Encounter (Signed)
He was advised to continue off medication as his Hga1c is very good. We are due to recheck around Oct/Nov to make sure readings are still at goal off medication. This was discussed in July at recent visit.

## 2019-03-04 NOTE — Telephone Encounter (Signed)
Daughter informed of MD response and stated understanding

## 2019-03-04 NOTE — Telephone Encounter (Signed)
Called daughter and give MD response. States that he has been off the metformin but this week patient got scared because his sugars went to 170. Daughter states that patient then took a metformin last night and that his BS went to 120. They just wanted to be ahead of any changes. informed due for a follow up with [place] this month or next. Daughter wanted to know if there is any way to have BS checked when he gets his flu shot tomorrow? I was not sure as I know we have some rules on flu clinic only being for flu shots.

## 2019-03-04 NOTE — Telephone Encounter (Signed)
He should stay off metformin. An isolated sugar of 170 is not significant. Will place labs so he can go down to lab tomorrow when here for the HgA1c.

## 2019-03-05 ENCOUNTER — Other Ambulatory Visit: Payer: Self-pay

## 2019-03-05 ENCOUNTER — Ambulatory Visit (INDEPENDENT_AMBULATORY_CARE_PROVIDER_SITE_OTHER): Payer: Medicare Other

## 2019-03-05 ENCOUNTER — Other Ambulatory Visit (INDEPENDENT_AMBULATORY_CARE_PROVIDER_SITE_OTHER): Payer: Medicare Other

## 2019-03-05 DIAGNOSIS — E1169 Type 2 diabetes mellitus with other specified complication: Secondary | ICD-10-CM | POA: Diagnosis not present

## 2019-03-05 DIAGNOSIS — Z23 Encounter for immunization: Secondary | ICD-10-CM | POA: Diagnosis not present

## 2019-03-05 DIAGNOSIS — E785 Hyperlipidemia, unspecified: Secondary | ICD-10-CM | POA: Diagnosis not present

## 2019-03-05 LAB — HEMOGLOBIN A1C: Hgb A1c MFr Bld: 6.4 % (ref 4.6–6.5)

## 2019-03-22 ENCOUNTER — Other Ambulatory Visit: Payer: Self-pay | Admitting: Internal Medicine

## 2019-04-23 ENCOUNTER — Other Ambulatory Visit: Payer: Self-pay

## 2019-04-23 ENCOUNTER — Ambulatory Visit: Payer: Self-pay | Admitting: *Deleted

## 2019-04-23 ENCOUNTER — Ambulatory Visit (INDEPENDENT_AMBULATORY_CARE_PROVIDER_SITE_OTHER): Payer: Medicare Other | Admitting: Internal Medicine

## 2019-04-23 DIAGNOSIS — R05 Cough: Secondary | ICD-10-CM

## 2019-04-23 DIAGNOSIS — R062 Wheezing: Secondary | ICD-10-CM

## 2019-04-23 DIAGNOSIS — E1169 Type 2 diabetes mellitus with other specified complication: Secondary | ICD-10-CM

## 2019-04-23 DIAGNOSIS — R059 Cough, unspecified: Secondary | ICD-10-CM

## 2019-04-23 DIAGNOSIS — E785 Hyperlipidemia, unspecified: Secondary | ICD-10-CM

## 2019-04-23 MED ORDER — PREDNISONE 10 MG PO TABS: ORAL_TABLET | ORAL | 0 refills | Status: DC

## 2019-04-23 MED ORDER — HYDROCODONE-HOMATROPINE 5-1.5 MG/5ML PO SYRP: 5.0000 mL | ORAL_SOLUTION | Freq: Four times a day (QID) | ORAL | 0 refills | Status: AC | PRN

## 2019-04-23 MED ORDER — AZITHROMYCIN 250 MG PO TABS: ORAL_TABLET | ORAL | 0 refills | Status: DC

## 2019-04-23 NOTE — Telephone Encounter (Signed)
Appointment has been made with Dr.John.

## 2019-04-23 NOTE — Telephone Encounter (Signed)
Noted  

## 2019-04-23 NOTE — Patient Instructions (Signed)
Please take all new medication as prescribed - the antibiotic, cough medicine and prednisone  OK to use your wife's albuterol inhaler as needed as you mentioned for a few doses  Please continue all other medications as before, and refills have been done if requested.  Please have the pharmacy call with any other refills you may need.  Please keep your appointments with your specialists as you may have planned

## 2019-04-23 NOTE — Telephone Encounter (Signed)
I returned the call to daughter [name] regarding her father.   She called him and so he was on a conference call the 3 of [place]. He has been having wheezing on and off for the last couple of months.   A month ago he took some Mucinex which helped but now the wheezing is back.  I went over the care advice with them and then warm transferred the call into Dr. Nathanial Millman office to [place] to be scheduled.   I sent my notes to the office.  Reason for Disposition . [1] MODERATE longstanding difficulty breathing (e.g., speaks in phrases, SOB even at rest, pulse 100-120) AND [2] SAME as normal  Answer Assessment - Initial Assessment Questions 1. RESPIRATORY STATUS: "Describe your breathing?" (e.g., wheezing, shortness of breath, unable to speak, severe coughing)      He has been wheezing for a couple of weeks.   Daughter [name] had called in. The wheezing happens mostly when he lays down.   A month ago he was wheezing.   He took Mucinix and it went away but now it's back.     2. ONSET: "When did this breathing problem begin?"      Daughter calling Dad on her phone so he is on the phone with [place].    3. PATTERN "Does the difficult breathing come and go, or has it been constant since it started?"      The wheezing comes and goes.   No history of asthma.     4. SEVERITY: "How bad is your breathing?" (e.g., mild, moderate, severe)    - MILD: No SOB at rest, mild SOB with walking, speaks normally in sentences, can lay down, no retractions, pulse < 100.    - MODERATE: SOB at rest, SOB with minimal exertion and prefers to sit, cannot lie down flat, speaks in phrases, mild retractions, audible wheezing, pulse 100-120.    - SEVERE: Very SOB at rest, speaks in single words, struggling to breathe, sitting hunched forward, retractions, pulse > 120      A little short of breath when walking around the house but not when resting.   He is very active.    5. RECURRENT SYMPTOM: "Have you had  difficulty breathing before?" If so, ask: "When was the last time?" and "What happened that time?"      Yes.   He had wheezing in the past.   Not seen by a doctor for it.    6. 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    I have aortic stenosis. 7. LUNG HISTORY: "Do you have any history of lung disease?"  (e.g., pulmonary embolus, asthma, emphysema)     No lung issues.   8. CAUSE: "What do you think is causing the breathing problem?"      I don't know 9. OTHER SYMPTOMS: "Do you have any other symptoms? (e.g., dizziness, runny nose, cough, chest pain, fever)     None of the above.   I'm coughing up yellow stuff just a little. 10. PREGNANCY: "Is there any chance you are pregnant?" "When was your last menstrual period?"      N/A 11. TRAVEL: "Have you traveled out of the country in the last month?" (e.g., travel history, exposures)       No  Protocols used: BREATHING DIFFICULTY-A-AH

## 2019-04-23 NOTE — Progress Notes (Signed)
   Subjective:    Patient ID: Adam Briggs, male    DOB: 1930/01/26, 85 y.o.   MRN: [id number]  HPI    Review of Systems     Objective:   Physical Exam        Assessment & Plan:

## 2019-04-24 ENCOUNTER — Encounter: Payer: Self-pay | Admitting: Internal Medicine

## 2019-04-24 DIAGNOSIS — R059 Cough, unspecified: Secondary | ICD-10-CM | POA: Insufficient documentation

## 2019-04-24 DIAGNOSIS — R062 Wheezing: Secondary | ICD-10-CM | POA: Insufficient documentation

## 2019-04-24 DIAGNOSIS — R05 Cough: Secondary | ICD-10-CM | POA: Insufficient documentation

## 2019-04-24 NOTE — Progress Notes (Signed)
Patient ID: Adam Briggs, male   DOB: 1929-10-12, 85 y.o.   MRN: [id number]  Virtual Visit via Video Note  I connected with Bethann Goo on 04/24/19 at  1:40 PM EST by a video enabled telemedicine application and verified that I am speaking with the correct person using two identifiers.  Location: Patient: at home Provider: at office   I discussed the limitations of evaluation and management by telemedicine and the availability of in person appointments. The patient expressed understanding and agreed to proceed.  History of Present Illness: Here with acute onset mild to mod 2-3 days ST, HA, general weakness and malaise, with prod cough greenish sputum, but Pt denies chest pain, increased sob or doe, wheezing, orthopnea, PND, increased LE swelling, palpitations, dizziness or syncope, except for mild wheezing onset last 2 nights.  Has been breathing somewhat harder on the treadmill.  No hx of asthma, copd, or chf.  Mucinex has helped.  Pt denies new neurological symptoms such as new headache, or facial or extremity weakness or numbness   Pt denies polydipsia, polyuria  Past Medical History:  Diagnosis Date  . BENIGN PROSTATIC HYPERTROPHY   . Cataract   . DIVERTICULOSIS, COLON   . DM   . GERD   . Heart murmur   . Hernia   . HYPERCHOLESTEROLEMIA   . HYPERTENSION   . Moderate aortic stenosis 10/30/2011   echo 2013  . Prostate cancer [facility])    s/p seed implant   Past Surgical History:  Procedure Laterality Date  . CATARACT EXTRACTION W/ INTRAOCULAR LENS IMPLANT  02/2013   R, then L 4 weeks later - [facility]  . INGUINAL HERNIA REPAIR     Right  . INGUINAL HERNIA REPAIR Left 07/15/2014   Procedure: LEFT INGUINAL HERNIA REPAIR WITH MESH;  Surgeon: Armandina Gemma, MD;  Location: [facility];  Service: General;  Laterality: Left;  . INSERTION OF MESH Left 07/15/2014   Procedure: INSERTION OF MESH;  Surgeon: Armandina Gemma, MD;  Location: [facility];  Service: General;  Laterality: Left;  . RADIOACTIVE SEED  IMPLANT      reports that he quit smoking about 52 years ago. His smoking use included cigarettes. He has a 17.50 pack-year smoking history. He has never used smokeless tobacco. He reports that he does not drink alcohol or use drugs. family history is not on file. Allergies  Allergen Reactions  . Lisinopril     REACTION: Rash   Current Outpatient Medications on File Prior to Visit  Medication Sig Dispense Refill  . amLODipine (NORVASC) 5 MG tablet Take 1 tablet (5 mg total) by mouth daily. 90 tablet 1  . aspirin EC 81 MG tablet Take 81 mg by mouth daily.    . fluticasone (FLONASE) 50 MCG/ACT nasal spray Place 2 sprays into both nostrils daily. 16 g 6  . hydrochlorothiazide (HYDRODIURIL) 25 MG tablet Take 1 tablet (25 mg total) by mouth daily. 90 tablet 1  . losartan (COZAAR) 100 MG tablet Take 1 tablet by mouth once daily 90 tablet 1  . simvastatin (ZOCOR) 20 MG tablet TAKE 1 TABLET BY MOUTH ONCE DAILY AT  6  PM 90 tablet 1   No current facility-administered medications on file prior to visit.     Observations/Objective: Alert, NAD, appropriate mood and affect, resps normal, cn 2-12 intact, moves all 4s, no visible rash or swelling Lab Results  Component Value Date   WBC 4.5 11/19/2018   HGB 14.4 11/19/2018   HCT 43.6  11/19/2018   PLT 157.0 11/19/2018   GLUCOSE 110 (H) 11/19/2018   CHOL 132 11/19/2018   TRIG 121.0 11/19/2018   HDL 36.30 (L) 11/19/2018   LDLDIRECT 110.2 12/28/2007   LDLCALC 71 11/19/2018   ALT 8 11/19/2018   AST 9 11/19/2018   NA 140 11/19/2018   K 4.0 11/19/2018   CL 103 11/19/2018   CREATININE 1.29 11/19/2018   BUN 26 (H) 11/19/2018   CO2 30 11/19/2018   TSH 2.85 11/19/2018   PSA 0.036 04/04/2017   INR 0.9 08/10/2007   HGBA1C 6.4 03/05/2019   MICROALBUR 20.0 (H) 10/19/2013   Assessment and Plan: See notes  Follow Up Instructions: See notes   I discussed the assessment and treatment plan with the patient. The patient was provided an opportunity  to ask questions and all were answered. The patient agreed with the plan and demonstrated an understanding of the instructions.   The patient was advised to call back or seek an in-person evaluation if the symptoms worsen or if the condition fails to improve as anticipated.   Cathlean Cower, MD

## 2019-04-24 NOTE — Assessment & Plan Note (Signed)
Mild to mod, liekly bronchospastic in nature, has wifes albut inhaler for prn use, also predpac asd,  to f/u any worsening symptoms or concerns

## 2019-04-24 NOTE — Assessment & Plan Note (Signed)
stable overall by history and exam, recent data reviewed with pt, and pt to continue medical treatment as before,  to f/u any worsening symptoms or concerns  

## 2019-04-24 NOTE — Assessment & Plan Note (Signed)
Mild to mod, c/w bronchitis vs pna, for antibx course, cough med prn,  to f/u any worsening symptoms or concerns 

## 2019-04-30 DIAGNOSIS — N3281 Overactive bladder: Secondary | ICD-10-CM | POA: Diagnosis not present

## 2019-04-30 DIAGNOSIS — Z8546 Personal history of malignant neoplasm of prostate: Secondary | ICD-10-CM | POA: Diagnosis not present

## 2019-06-15 ENCOUNTER — Other Ambulatory Visit: Payer: Self-pay | Admitting: Internal Medicine

## 2019-09-21 ENCOUNTER — Other Ambulatory Visit: Payer: Self-pay | Admitting: Internal Medicine

## 2019-11-26 ENCOUNTER — Ambulatory Visit (INDEPENDENT_AMBULATORY_CARE_PROVIDER_SITE_OTHER): Payer: Medicare Other | Admitting: Internal Medicine

## 2019-11-26 ENCOUNTER — Other Ambulatory Visit: Payer: Self-pay

## 2019-11-26 ENCOUNTER — Encounter: Payer: Self-pay | Admitting: Internal Medicine

## 2019-11-26 VITALS — BP 122/76 | HR 113 | Temp 98.8°F | Ht 70.0 in | Wt 171.0 lb

## 2019-11-26 DIAGNOSIS — R0600 Dyspnea, unspecified: Secondary | ICD-10-CM | POA: Diagnosis not present

## 2019-11-26 DIAGNOSIS — Z0000 Encounter for general adult medical examination without abnormal findings: Secondary | ICD-10-CM | POA: Diagnosis not present

## 2019-11-26 DIAGNOSIS — E785 Hyperlipidemia, unspecified: Secondary | ICD-10-CM

## 2019-11-26 DIAGNOSIS — E1169 Type 2 diabetes mellitus with other specified complication: Secondary | ICD-10-CM

## 2019-11-26 DIAGNOSIS — I10 Essential (primary) hypertension: Secondary | ICD-10-CM | POA: Diagnosis not present

## 2019-11-26 DIAGNOSIS — R0609 Other forms of dyspnea: Secondary | ICD-10-CM

## 2019-11-26 MED ORDER — SIMVASTATIN 20 MG PO TABS: 20.0000 mg | ORAL_TABLET | Freq: Every day | ORAL | 3 refills | Status: DC

## 2019-11-26 MED ORDER — AMLODIPINE BESYLATE 5 MG PO TABS: 5.0000 mg | ORAL_TABLET | Freq: Every day | ORAL | 3 refills | Status: DC

## 2019-11-26 MED ORDER — LOSARTAN POTASSIUM-HCTZ 100-25 MG PO TABS: 1.0000 | ORAL_TABLET | Freq: Every day | ORAL | 3 refills | Status: DC

## 2019-11-26 NOTE — Assessment & Plan Note (Signed)
BP at goal on amlodipine and losartan/hctz. Combination pill sent in instead of two components since this is more available to reduce pill number. Checking CMP and adjust as needed.

## 2019-11-26 NOTE — Assessment & Plan Note (Signed)
Checking HgA1c and adjust as needed. Diet controlled and on ARB and statin. Foot exam done and reminded about eye exam yearly.

## 2019-11-26 NOTE — Patient Instructions (Signed)

## 2019-11-26 NOTE — Assessment & Plan Note (Signed)
Flu shot yearly. Covid-19 up to date. Pneumonia complete. Shingrix counseled. Tetanus due 2024. Colonoscopy aged out. Counseled about sun safety and mole surveillance. Counseled about the dangers of distracted driving. Given 10 year screening recommendations.

## 2019-11-26 NOTE — Assessment & Plan Note (Signed)
Checking lipid panel and adjust simvastatin 20 mg daily as needed.  

## 2019-11-26 NOTE — Progress Notes (Signed)
Subjective:   Patient ID: Adam Briggs, male    DOB: [birth year], 86 y.o.   MRN: [id number]  HPI Here for medicare wellness, no new complaints. Please see A/P for status and treatment of chronic medical problems.   HPI #2: Here for follow up diabetes (diet controlled, very active, works in garden daily, denies eating more sweets, weight is stable throughout covid-19, denies numbness or tingling in the hands or feet) and blood pressure (taking amlodipine and losartan and hctz, denies headaches or chest pains, denies side effects) and cholesterol (taking simvastatin and denies side effects, denies chest pains or stroke symptoms).   Diet: DM since diabetic Physical activity: active daily Depression/mood screen: negative Hearing: intact to whispered voice Visual acuity: grossly normal, performs annual eye exam  ADLs: capable Fall risk: none Home safety: good Cognitive evaluation: intact to orientation, naming, recall and repetition EOL planning: adv directives discussed, not in place    Office Visit from 11/26/2019 in [place] [facility] Total Score 0      I have personally reviewed and have noted 1. The patient's medical and social history - reviewed today no changes 2. Their use of alcohol, tobacco or illicit drugs 3. Their current medications and supplements 4. The patient's functional ability including ADL's, fall risks, home safety risks and hearing or visual impairment. 5. Diet and physical activities 6. Evidence for depression or mood disorders 7. Care team reviewed and updated  Patient Care Team: [name], MD as PCP - General (Internal Medicine) Renato Shin, MD (Endocrinology) Irene Shipper, MD (Gastroenterology) Carolan Clines, MD (Inactive) (Urology) Minus Breeding, MD (Cardiology) Past Medical History:  Diagnosis Date  . BENIGN PROSTATIC HYPERTROPHY   . Cataract   . DIVERTICULOSIS, COLON   . DM   . GERD   . Heart murmur     . Hernia   . HYPERCHOLESTEROLEMIA   . HYPERTENSION   . Moderate aortic stenosis 10/30/2011   echo 2013  . Prostate cancer [facility])    s/p seed implant   Past Surgical History:  Procedure Laterality Date  . CATARACT EXTRACTION W/ INTRAOCULAR LENS IMPLANT  02/2013   R, then L 4 weeks later - [place]  . INGUINAL HERNIA REPAIR     Right  . INGUINAL HERNIA REPAIR Left 07/15/2014   Procedure: LEFT INGUINAL HERNIA REPAIR WITH MESH;  Surgeon: Armandina Gemma, MD;  [facility];  Service: General;  Laterality: Left;  . INSERTION OF MESH Left 07/15/2014   Procedure: INSERTION OF MESH;  Surgeon: Armandina Gemma, MD;  [facility];  Service: General;  Laterality: Left;  . RADIOACTIVE SEED IMPLANT     Family History  Problem Relation Age of Onset  . Colon cancer Neg Hx      Review of Systems  Constitutional: Negative.   HENT: Negative.   Eyes: Negative.   Respiratory: Negative for cough, chest tightness and shortness of breath.   Cardiovascular: Negative for chest pain, palpitations and leg swelling.  Gastrointestinal: Negative for abdominal distention, abdominal pain, constipation, diarrhea, nausea and vomiting.  Musculoskeletal: Negative.   Skin: Negative.   Neurological: Negative.   Psychiatric/Behavioral: Negative.     Objective:  Physical Exam Constitutional:      Appearance: He is well-developed.  HENT:     Head: Normocephalic and atraumatic.  Cardiovascular:     Rate and Rhythm: Normal rate and regular rhythm.     Heart sounds: Murmur heard.      Comments: Stable  systolic murmur Pulmonary:     Effort: Pulmonary effort is normal. No respiratory distress.     Breath sounds: Normal breath sounds. No wheezing or rales.  Abdominal:     General: Bowel sounds are normal. There is no distension.     Palpations: Abdomen is soft.     Tenderness: There is no abdominal tenderness. There is no rebound.  Musculoskeletal:     Cervical back: Normal range of motion.  Skin:     General: Skin is warm and dry.  Neurological:     Mental Status: He is alert and oriented to person, place, and time.     Coordination: Coordination normal.     Vitals:   11/26/19 1517  BP: 122/76  Pulse: (!) 113  Temp: 98.8 F (37.1 C)  TempSrc: Oral  SpO2: 96%  Weight: 171 lb (77.6 kg)  Height: 5\' 10"  (1.778 m)    This visit occurred during the SARS-CoV-2 public health emergency.  Safety protocols were in place, including screening questions prior to the visit, additional usage of staff PPE, and extensive cleaning of exam room while observing appropriate contact time as indicated for disinfecting solutions.   Assessment & Plan:

## 2019-11-27 LAB — COMPREHENSIVE METABOLIC PANEL
AG Ratio: 2.2 (calc) (ref 1.0–2.5)
ALT: 9 U/L (ref 9–46)
AST: 12 U/L (ref 10–35)
Albumin: 4.3 g/dL (ref 3.6–5.1)
Alkaline phosphatase (APISO): 54 U/L (ref 35–144)
BUN/Creatinine Ratio: 22 (calc) (ref 6–22)
BUN: 30 mg/dL — ABNORMAL HIGH (ref 7–25)
CO2: 31 mmol/L (ref 20–32)
Calcium: 9.6 mg/dL (ref 8.6–10.3)
Chloride: 102 mmol/L (ref 98–110)
Creat: 1.37 mg/dL — ABNORMAL HIGH (ref 0.70–1.11)
Globulin: 2 g/dL (calc) (ref 1.9–3.7)
Glucose, Bld: 117 mg/dL — ABNORMAL HIGH (ref 65–99)
Potassium: 3.9 mmol/L (ref 3.5–5.3)
Sodium: 140 mmol/L (ref 135–146)
Total Bilirubin: 0.5 mg/dL (ref 0.2–1.2)
Total Protein: 6.3 g/dL (ref 6.1–8.1)

## 2019-11-27 LAB — CBC
HCT: 43.3 % (ref 38.5–50.0)
Hemoglobin: 13.8 g/dL (ref 13.2–17.1)
MCH: 27.7 pg (ref 27.0–33.0)
MCHC: 31.9 g/dL — ABNORMAL LOW (ref 32.0–36.0)
MCV: 86.8 fL (ref 80.0–100.0)
MPV: 10.9 fL (ref 7.5–12.5)
Platelets: 193 10*3/uL (ref 140–400)
RBC: 4.99 10*6/uL (ref 4.20–5.80)
RDW: 13.6 % (ref 11.0–15.0)
WBC: 6.1 10*3/uL (ref 3.8–10.8)

## 2019-11-27 LAB — HEMOGLOBIN A1C
Hgb A1c MFr Bld: 6 % of total Hgb — ABNORMAL HIGH (ref <5.7)
Mean Plasma Glucose: 126 (calc)
eAG (mmol/L): 7 (calc)

## 2019-11-27 LAB — BRAIN NATRIURETIC PEPTIDE: Brain Natriuretic Peptide: 126 pg/mL — ABNORMAL HIGH (ref <100)

## 2019-11-27 LAB — LIPID PANEL
Cholesterol: 156 mg/dL (ref <200)
HDL: 35 mg/dL — ABNORMAL LOW (ref >=40)
LDL Cholesterol (Calc): 94 mg/dL (calc)
Non-HDL Cholesterol (Calc): 121 mg/dL (calc) (ref <130)
Total CHOL/HDL Ratio: 4.5 (calc) (ref <5.0)
Triglycerides: 175 mg/dL — ABNORMAL HIGH (ref <150)

## 2020-04-07 ENCOUNTER — Other Ambulatory Visit: Payer: Self-pay

## 2020-04-07 ENCOUNTER — Ambulatory Visit (INDEPENDENT_AMBULATORY_CARE_PROVIDER_SITE_OTHER): Payer: Medicare Other | Admitting: *Deleted

## 2020-04-07 DIAGNOSIS — Z23 Encounter for immunization: Secondary | ICD-10-CM

## 2020-04-21 DIAGNOSIS — Z8546 Personal history of malignant neoplasm of prostate: Secondary | ICD-10-CM | POA: Diagnosis not present

## 2020-04-24 ENCOUNTER — Telehealth (INDEPENDENT_AMBULATORY_CARE_PROVIDER_SITE_OTHER): Payer: Medicare Other | Admitting: Internal Medicine

## 2020-04-24 ENCOUNTER — Other Ambulatory Visit: Payer: Self-pay

## 2020-04-24 ENCOUNTER — Encounter: Payer: Self-pay | Admitting: Internal Medicine

## 2020-04-24 ENCOUNTER — Telehealth: Payer: Self-pay | Admitting: Internal Medicine

## 2020-04-24 DIAGNOSIS — R062 Wheezing: Secondary | ICD-10-CM

## 2020-04-24 MED ORDER — PREDNISONE 20 MG PO TABS
40.0000 mg | ORAL_TABLET | Freq: Every day | ORAL | 0 refills | Status: DC
Start: 2020-04-24 — End: 2020-09-15

## 2020-04-24 NOTE — Progress Notes (Signed)
Virtual Visit via Video Note  I connected with Adam Briggs on 04/24/20 at  1:20 PM EST by a video enabled telemedicine application and verified that I am speaking with the correct person using two identifiers.  The patient and the provider were at separate locations throughout the entire encounter. Patient location: home, Provider location: work   I discussed the limitations of evaluation and management by telemedicine and the availability of in person appointments. The patient expressed understanding and agreed to proceed. The patient and the provider were the only parties present for the visit unless noted in HPI below.  History of Present Illness: The patient is a 86 y.o. man with visit for wheezing and mild SOB. Started about 1-2 days ago. Denies sick contacts or leaving home. Has had covid-19 vaccination and booster. Has no fevers or chills. Denies sinus congestion or cough. Does get episodes like this every once in a while. Has not had inhaler for this in the past. Typically gets prednisone and this clears it up. Overall it is not worsening but not improving. Has tried nothing  Observations/Objective: Appearance: normal, breathing appears normal with sitting, speaking in full sentences with SOB, casual grooming, abdomen does not appear distended, throat normal, memory normal, mental status is A and O times 3  Assessment and Plan: See problem oriented charting  Follow Up Instructions: rx albuterol and prednisone, if no resolution needs further assessment  I discussed the assessment and treatment plan with the patient. The patient was provided an opportunity to ask questions and all were answered. The patient agreed with the plan and demonstrated an understanding of the instructions.   The patient was advised to call back or seek an in-person evaluation if the symptoms worsen or if the condition fails to improve as anticipated.  [name], MD

## 2020-04-24 NOTE — Telephone Encounter (Signed)
    Patient's daughter calling to request an inhaler to also be prescribed for patient

## 2020-04-25 MED ORDER — ALBUTEROL SULFATE HFA 108 (90 BASE) MCG/ACT IN AERS: 1.0000 | INHALATION_SPRAY | Freq: Four times a day (QID) | RESPIRATORY_TRACT | 0 refills | Status: DC | PRN

## 2020-04-25 NOTE — Telephone Encounter (Signed)
Patients daughter informed

## 2020-04-25 NOTE — Assessment & Plan Note (Signed)
Is former smoker and possible he has mild COPD. Rx prednisone and albuterol inhaler. If no improvement needs further assessment. Once recovered should have PFTs to assess if willing.

## 2020-04-25 NOTE — Telephone Encounter (Signed)
Sent in

## 2020-04-27 ENCOUNTER — Telehealth: Payer: Self-pay | Admitting: Internal Medicine

## 2020-04-27 DIAGNOSIS — N320 Bladder-neck obstruction: Secondary | ICD-10-CM | POA: Diagnosis not present

## 2020-04-27 MED ORDER — DOXYCYCLINE HYCLATE 100 MG PO TABS: 100.0000 mg | ORAL_TABLET | Freq: Two times a day (BID) | ORAL | 0 refills | Status: DC

## 2020-04-27 NOTE — Telephone Encounter (Signed)
Called daughter, [name] to inform rx is sent. No answer/unable to leave vm.

## 2020-04-27 NOTE — Telephone Encounter (Signed)
Sent in antibiotic

## 2020-04-27 NOTE — Telephone Encounter (Signed)
Patients daughter called and said that he was not getting any better and he is coughing up yellow mucus and she was  wondering if there was any way an antibiotic could be called into [place], [place]

## 2020-06-14 ENCOUNTER — Other Ambulatory Visit: Payer: Self-pay | Admitting: Internal Medicine

## 2020-09-15 ENCOUNTER — Encounter (HOSPITAL_COMMUNITY): Payer: Self-pay

## 2020-09-15 ENCOUNTER — Other Ambulatory Visit: Payer: Self-pay

## 2020-09-15 ENCOUNTER — Ambulatory Visit (INDEPENDENT_AMBULATORY_CARE_PROVIDER_SITE_OTHER): Payer: Medicare Other

## 2020-09-15 ENCOUNTER — Inpatient Hospital Stay (HOSPITAL_COMMUNITY)
Admission: EM | Admit: 2020-09-15 | Discharge: 2020-09-28 | DRG: 186 | Disposition: A | Discharge status: Home / Self Care | Payer: Medicare Other | Attending: Internal Medicine | Admitting: Internal Medicine

## 2020-09-15 ENCOUNTER — Encounter: Payer: Self-pay | Admitting: Internal Medicine

## 2020-09-15 ENCOUNTER — Emergency Department (HOSPITAL_COMMUNITY): Payer: Medicare Other

## 2020-09-15 ENCOUNTER — Ambulatory Visit (INDEPENDENT_AMBULATORY_CARE_PROVIDER_SITE_OTHER): Payer: Medicare Other | Admitting: Internal Medicine

## 2020-09-15 VITALS — BP 132/90 | HR 110 | Temp 98.2°F | Ht 70.0 in | Wt 168.4 lb

## 2020-09-15 DIAGNOSIS — J9811 Atelectasis: Secondary | ICD-10-CM | POA: Diagnosis present

## 2020-09-15 DIAGNOSIS — J90 Pleural effusion, not elsewhere classified: Secondary | ICD-10-CM | POA: Diagnosis not present

## 2020-09-15 DIAGNOSIS — Z87891 Personal history of nicotine dependence: Secondary | ICD-10-CM

## 2020-09-15 DIAGNOSIS — I350 Nonrheumatic aortic (valve) stenosis: Secondary | ICD-10-CM

## 2020-09-15 DIAGNOSIS — C61 Malignant neoplasm of prostate: Secondary | ICD-10-CM | POA: Diagnosis not present

## 2020-09-15 DIAGNOSIS — E1169 Type 2 diabetes mellitus with other specified complication: Secondary | ICD-10-CM | POA: Diagnosis not present

## 2020-09-15 DIAGNOSIS — Z79899 Other long term (current) drug therapy: Secondary | ICD-10-CM

## 2020-09-15 DIAGNOSIS — R918 Other nonspecific abnormal finding of lung field: Secondary | ICD-10-CM | POA: Diagnosis not present

## 2020-09-15 DIAGNOSIS — R011 Cardiac murmur, unspecified: Secondary | ICD-10-CM | POA: Diagnosis present

## 2020-09-15 DIAGNOSIS — E7800 Pure hypercholesterolemia, unspecified: Secondary | ICD-10-CM | POA: Diagnosis present

## 2020-09-15 DIAGNOSIS — Z20822 Contact with and (suspected) exposure to covid-19: Secondary | ICD-10-CM | POA: Diagnosis present

## 2020-09-15 DIAGNOSIS — Z888 Allergy status to other drugs, medicaments and biological substances status: Secondary | ICD-10-CM | POA: Diagnosis not present

## 2020-09-15 DIAGNOSIS — I119 Hypertensive heart disease without heart failure: Secondary | ICD-10-CM | POA: Diagnosis present

## 2020-09-15 DIAGNOSIS — J939 Pneumothorax, unspecified: Secondary | ICD-10-CM

## 2020-09-15 DIAGNOSIS — Z961 Presence of intraocular lens: Secondary | ICD-10-CM | POA: Diagnosis present

## 2020-09-15 DIAGNOSIS — Z4682 Encounter for fitting and adjustment of non-vascular catheter: Secondary | ICD-10-CM | POA: Diagnosis not present

## 2020-09-15 DIAGNOSIS — Z8546 Personal history of malignant neoplasm of prostate: Secondary | ICD-10-CM | POA: Diagnosis not present

## 2020-09-15 DIAGNOSIS — Z9842 Cataract extraction status, left eye: Secondary | ICD-10-CM

## 2020-09-15 DIAGNOSIS — N400 Enlarged prostate without lower urinary tract symptoms: Secondary | ICD-10-CM | POA: Diagnosis present

## 2020-09-15 DIAGNOSIS — R0600 Dyspnea, unspecified: Secondary | ICD-10-CM | POA: Diagnosis not present

## 2020-09-15 DIAGNOSIS — R0602 Shortness of breath: Secondary | ICD-10-CM

## 2020-09-15 DIAGNOSIS — I4819 Other persistent atrial fibrillation: Secondary | ICD-10-CM | POA: Diagnosis present

## 2020-09-15 DIAGNOSIS — I4891 Unspecified atrial fibrillation: Secondary | ICD-10-CM | POA: Diagnosis not present

## 2020-09-15 DIAGNOSIS — R062 Wheezing: Secondary | ICD-10-CM | POA: Diagnosis not present

## 2020-09-15 DIAGNOSIS — J9601 Acute respiratory failure with hypoxia: Secondary | ICD-10-CM | POA: Diagnosis not present

## 2020-09-15 DIAGNOSIS — Z7982 Long term (current) use of aspirin: Secondary | ICD-10-CM

## 2020-09-15 DIAGNOSIS — J984 Other disorders of lung: Secondary | ICD-10-CM | POA: Diagnosis not present

## 2020-09-15 DIAGNOSIS — R0902 Hypoxemia: Secondary | ICD-10-CM | POA: Diagnosis not present

## 2020-09-15 DIAGNOSIS — I2510 Atherosclerotic heart disease of native coronary artery without angina pectoris: Secondary | ICD-10-CM | POA: Diagnosis not present

## 2020-09-15 DIAGNOSIS — I10 Essential (primary) hypertension: Secondary | ICD-10-CM | POA: Diagnosis not present

## 2020-09-15 DIAGNOSIS — J9819 Other pulmonary collapse: Secondary | ICD-10-CM | POA: Diagnosis not present

## 2020-09-15 DIAGNOSIS — Z9889 Other specified postprocedural states: Secondary | ICD-10-CM | POA: Diagnosis not present

## 2020-09-15 DIAGNOSIS — E119 Type 2 diabetes mellitus without complications: Secondary | ICD-10-CM | POA: Diagnosis present

## 2020-09-15 DIAGNOSIS — J948 Other specified pleural conditions: Secondary | ICD-10-CM | POA: Diagnosis present

## 2020-09-15 DIAGNOSIS — J929 Pleural plaque without asbestos: Secondary | ICD-10-CM | POA: Diagnosis not present

## 2020-09-15 DIAGNOSIS — Z9841 Cataract extraction status, right eye: Secondary | ICD-10-CM | POA: Diagnosis not present

## 2020-09-15 DIAGNOSIS — Z978 Presence of other specified devices: Secondary | ICD-10-CM | POA: Diagnosis not present

## 2020-09-15 DIAGNOSIS — E785 Hyperlipidemia, unspecified: Secondary | ICD-10-CM | POA: Diagnosis present

## 2020-09-15 DIAGNOSIS — K219 Gastro-esophageal reflux disease without esophagitis: Secondary | ICD-10-CM | POA: Diagnosis present

## 2020-09-15 LAB — CBC WITH DIFFERENTIAL/PLATELET
Abs Immature Granulocytes: 0.02 10*3/uL (ref 0.00–0.07)
Basophils Absolute: 0 10*3/uL (ref 0.0–0.1)
Basophils Relative: 0 %
Eosinophils Absolute: 0.1 10*3/uL (ref 0.0–0.5)
Eosinophils Relative: 1 %
HCT: 40.7 % (ref 39.0–52.0)
Hemoglobin: 12.5 g/dL — ABNORMAL LOW (ref 13.0–17.0)
Immature Granulocytes: 0 %
Lymphocytes Relative: 10 %
Lymphs Abs: 0.7 10*3/uL (ref 0.7–4.0)
MCH: 25.8 pg — ABNORMAL LOW (ref 26.0–34.0)
MCHC: 30.7 g/dL (ref 30.0–36.0)
MCV: 83.9 fL (ref 80.0–100.0)
Monocytes Absolute: 0.8 10*3/uL (ref 0.1–1.0)
Monocytes Relative: 10 %
Neutro Abs: 5.8 10*3/uL (ref 1.7–7.7)
Neutrophils Relative %: 79 %
Platelets: 230 10*3/uL (ref 150–400)
RBC: 4.85 MIL/uL (ref 4.22–5.81)
RDW: 15.9 % — ABNORMAL HIGH (ref 11.5–15.5)
WBC: 7.4 10*3/uL (ref 4.0–10.5)
nRBC: 0 % (ref 0.0–0.2)

## 2020-09-15 LAB — COMPREHENSIVE METABOLIC PANEL
ALT: 12 U/L (ref 0–53)
ALT: 15 U/L (ref 0–44)
AST: 11 U/L (ref 0–37)
AST: 15 U/L (ref 15–41)
Albumin: 3.9 g/dL (ref 3.5–5.2)
Albumin: 3.9 g/dL (ref 3.5–5.0)
Alkaline Phosphatase: 59 U/L (ref 39–117)
Alkaline Phosphatase: 62 U/L (ref 38–126)
Anion gap: 8 (ref 5–15)
BUN: 26 mg/dL — ABNORMAL HIGH (ref 6–23)
BUN: 26 mg/dL — ABNORMAL HIGH (ref 8–23)
CO2: 31 mEq/L (ref 19–32)
CO2: 30 mmol/L (ref 22–32)
Calcium: 9.4 mg/dL (ref 8.4–10.5)
Calcium: 9.3 mg/dL (ref 8.9–10.3)
Chloride: 100 mEq/L (ref 96–112)
Chloride: 100 mmol/L (ref 98–111)
Creatinine, Ser: 0.98 mg/dL (ref 0.40–1.50)
Creatinine, Ser: 0.95 mg/dL (ref 0.61–1.24)
GFR, Estimated: >60 mL/min (ref >60)
GFR: 68.47 mL/min (ref >60.00)
Glucose, Bld: 104 mg/dL — ABNORMAL HIGH (ref 70–99)
Glucose, Bld: 128 mg/dL — ABNORMAL HIGH (ref 70–99)
Potassium: 3.9 mEq/L (ref 3.5–5.1)
Potassium: 3.7 mmol/L (ref 3.5–5.1)
Sodium: 139 mEq/L (ref 135–145)
Sodium: 138 mmol/L (ref 135–145)
Total Bilirubin: 0.6 mg/dL (ref 0.2–1.2)
Total Bilirubin: 0.5 mg/dL (ref 0.3–1.2)
Total Protein: 6.7 g/dL (ref 6.0–8.3)
Total Protein: 7.1 g/dL (ref 6.5–8.1)

## 2020-09-15 LAB — CBC
HCT: 37.5 % — ABNORMAL LOW (ref 39.0–52.0)
HCT: 39.3 % (ref 39.0–52.0)
Hemoglobin: 12.2 g/dL — ABNORMAL LOW (ref 13.0–17.0)
Hemoglobin: 12.1 g/dL — ABNORMAL LOW (ref 13.0–17.0)
MCH: 25.9 pg — ABNORMAL LOW (ref 26.0–34.0)
MCHC: 32.5 g/dL (ref 30.0–36.0)
MCHC: 30.8 g/dL (ref 30.0–36.0)
MCV: 79.6 fl (ref 78.0–100.0)
MCV: 84.2 fL (ref 80.0–100.0)
Platelets: 203 10*3/uL (ref 150.0–400.0)
Platelets: 214 10*3/uL (ref 150–400)
RBC: 4.7 Mil/uL (ref 4.22–5.81)
RBC: 4.67 MIL/uL (ref 4.22–5.81)
RDW: 17.1 % — ABNORMAL HIGH (ref 11.5–15.5)
RDW: 15.9 % — ABNORMAL HIGH (ref 11.5–15.5)
WBC: 5.7 10*3/uL (ref 4.0–10.5)
WBC: 5.9 10*3/uL (ref 4.0–10.5)
nRBC: 0 % (ref 0.0–0.2)

## 2020-09-15 LAB — BLOOD GAS, VENOUS
Acid-Base Excess: 6.5 mmol/L — ABNORMAL HIGH (ref 0.0–2.0)
Bicarbonate: 32.4 mmol/L — ABNORMAL HIGH (ref 20.0–28.0)
O2 Saturation: 76.2 %
Patient temperature: 98.6
pCO2, Ven: 55.5 mmHg (ref 44.0–60.0)
pH, Ven: 7.385 (ref 7.250–7.430)
pO2, Ven: 44.9 mmHg (ref 32.0–45.0)

## 2020-09-15 LAB — ALBUMIN: Albumin: 3.6 g/dL (ref 3.5–5.0)

## 2020-09-15 LAB — BRAIN NATRIURETIC PEPTIDE
B Natriuretic Peptide: 238 pg/mL — ABNORMAL HIGH (ref 0.0–100.0)
Pro B Natriuretic peptide (BNP): 238 pg/mL — ABNORMAL HIGH (ref 0.0–100.0)

## 2020-09-15 LAB — GLUCOSE, CAPILLARY: Glucose-Capillary: 148 mg/dL — ABNORMAL HIGH (ref 70–99)

## 2020-09-15 LAB — LACTIC ACID, PLASMA: Lactic Acid, Venous: 1.1 mmol/L (ref 0.5–1.9)

## 2020-09-15 LAB — PROTIME-INR
INR: 1.1 (ref 0.8–1.2)
Prothrombin Time: 13.9 seconds (ref 11.4–15.2)

## 2020-09-15 LAB — RESP PANEL BY RT-PCR (FLU A&B, COVID) ARPGX2
Influenza A by PCR: NEGATIVE
Influenza B by PCR: NEGATIVE
SARS Coronavirus 2 by RT PCR: NEGATIVE

## 2020-09-15 LAB — TROPONIN I (HIGH SENSITIVITY)
Troponin I (High Sensitivity): 8 ng/L (ref <18)
Troponin I (High Sensitivity): 8 ng/L (ref <18)

## 2020-09-15 LAB — PROTEIN, TOTAL: Total Protein: 6.6 g/dL (ref 6.5–8.1)

## 2020-09-15 LAB — HEMOGLOBIN A1C: Hgb A1c MFr Bld: 6.2 % (ref 4.6–6.5)

## 2020-09-15 LAB — LACTATE DEHYDROGENASE: LDH: 122 U/L (ref 98–192)

## 2020-09-15 MED ORDER — OXYCODONE HCL 5 MG PO TABS: 5.0000 mg | ORAL_TABLET | ORAL | Status: DC | PRN

## 2020-09-15 MED ORDER — AMLODIPINE BESYLATE 5 MG PO TABS
5.0000 mg | ORAL_TABLET | Freq: Every day | ORAL | Status: DC
  Administered 2020-09-16 – 2020-09-18 (×3): 5 mg via ORAL
  Filled 2020-09-15 (×3): qty 1

## 2020-09-15 MED ORDER — ONDANSETRON HCL 4 MG/2ML IJ SOLN: 4.0000 mg | Freq: Four times a day (QID) | INTRAMUSCULAR | Status: DC | PRN

## 2020-09-15 MED ORDER — ACETAMINOPHEN 650 MG RE SUPP: 650.0000 mg | Freq: Four times a day (QID) | RECTAL | Status: DC | PRN

## 2020-09-15 MED ORDER — IPRATROPIUM BROMIDE 0.02 % IN SOLN
0.5000 mg | Freq: Four times a day (QID) | RESPIRATORY_TRACT | Status: DC
  Administered 2020-09-15: 0.5 mg via RESPIRATORY_TRACT
  Filled 2020-09-15: qty 2.5

## 2020-09-15 MED ORDER — IPRATROPIUM BROMIDE 0.02 % IN SOLN
0.5000 mg | Freq: Three times a day (TID) | RESPIRATORY_TRACT | Status: DC
  Administered 2020-09-16 – 2020-09-18 (×7): 0.5 mg via RESPIRATORY_TRACT
  Filled 2020-09-15 (×7): qty 2.5

## 2020-09-15 MED ORDER — ASPIRIN EC 81 MG PO TBEC
81.0000 mg | DELAYED_RELEASE_TABLET | Freq: Every day | ORAL | Status: DC
  Administered 2020-09-16 – 2020-09-18 (×3): 81 mg via ORAL
  Filled 2020-09-15 (×3): qty 1

## 2020-09-15 MED ORDER — ALUM & MAG HYDROXIDE-SIMETH 200-200-20 MG/5ML PO SUSP
30.0000 mL | ORAL | Status: DC | PRN
  Administered 2020-09-15: 30 mL via ORAL
  Filled 2020-09-15: qty 30

## 2020-09-15 MED ORDER — SIMVASTATIN 20 MG PO TABS
20.0000 mg | ORAL_TABLET | Freq: Every day | ORAL | Status: DC
  Administered 2020-09-16 – 2020-09-27 (×12): 20 mg via ORAL
  Filled 2020-09-15 (×12): qty 1

## 2020-09-15 MED ORDER — ONDANSETRON HCL 4 MG PO TABS: 4.0000 mg | ORAL_TABLET | Freq: Four times a day (QID) | ORAL | Status: DC | PRN

## 2020-09-15 MED ORDER — TAMSULOSIN HCL 0.4 MG PO CAPS
0.4000 mg | ORAL_CAPSULE | Freq: Every day | ORAL | Status: DC
  Administered 2020-09-16 – 2020-09-28 (×13): 0.4 mg via ORAL
  Filled 2020-09-15 (×13): qty 1

## 2020-09-15 MED ORDER — METOPROLOL TARTRATE 5 MG/5ML IV SOLN: 5.0000 mg | Freq: Four times a day (QID) | INTRAVENOUS | Status: DC | PRN

## 2020-09-15 MED ORDER — LEVALBUTEROL HCL 0.63 MG/3ML IN NEBU: 0.6300 mg | INHALATION_SOLUTION | Freq: Four times a day (QID) | RESPIRATORY_TRACT | Status: DC | PRN

## 2020-09-15 MED ORDER — INSULIN ASPART 100 UNIT/ML IJ SOLN: 0.0000 [IU] | Freq: Every day | INTRAMUSCULAR | Status: DC

## 2020-09-15 MED ORDER — INSULIN ASPART 100 UNIT/ML IJ SOLN: 0.0000 [IU] | Freq: Three times a day (TID) | INTRAMUSCULAR | Status: DC

## 2020-09-15 MED ORDER — LEVALBUTEROL HCL 0.63 MG/3ML IN NEBU
0.6300 mg | INHALATION_SOLUTION | Freq: Four times a day (QID) | RESPIRATORY_TRACT | Status: DC
  Administered 2020-09-15: 0.63 mg via RESPIRATORY_TRACT
  Filled 2020-09-15: qty 3

## 2020-09-15 MED ORDER — IOHEXOL 300 MG/ML  SOLN
75.0000 mL | Freq: Once | INTRAMUSCULAR | Status: AC | PRN
  Administered 2020-09-15: 75 mL via INTRAVENOUS

## 2020-09-15 MED ORDER — FLUTICASONE PROPIONATE 50 MCG/ACT NA SUSP
2.0000 | Freq: Every day | NASAL | Status: DC
  Administered 2020-09-16 – 2020-09-28 (×13): 2 via NASAL
  Filled 2020-09-15: qty 16

## 2020-09-15 MED ORDER — LEVALBUTEROL HCL 0.63 MG/3ML IN NEBU
0.6300 mg | INHALATION_SOLUTION | Freq: Three times a day (TID) | RESPIRATORY_TRACT | Status: DC
  Administered 2020-09-16 – 2020-09-18 (×7): 0.63 mg via RESPIRATORY_TRACT
  Filled 2020-09-15 (×7): qty 3

## 2020-09-15 MED ORDER — FUROSEMIDE 40 MG PO TABS
40.0000 mg | ORAL_TABLET | Freq: Every day | ORAL | Status: DC
  Administered 2020-09-15 – 2020-09-28 (×14): 40 mg via ORAL
  Filled 2020-09-15 (×15): qty 1

## 2020-09-15 MED ORDER — ALBUTEROL SULFATE HFA 108 (90 BASE) MCG/ACT IN AERS
INHALATION_SPRAY | RESPIRATORY_TRACT | 0 refills | Status: DC
Start: 2020-09-15 — End: 2020-11-21

## 2020-09-15 MED ORDER — ACETAMINOPHEN 325 MG PO TABS
650.0000 mg | ORAL_TABLET | Freq: Four times a day (QID) | ORAL | Status: DC | PRN
Start: 2020-09-15 — End: 2020-09-28

## 2020-09-15 NOTE — Assessment & Plan Note (Signed)
Checking CXR to assess for fluid or infection. CXR prelim reading by MD with severe fluid and cardiomegaly. Needs to go to ER and directed there.

## 2020-09-15 NOTE — Consult Note (Signed)
NAME:  Adam Briggs, MRN:  [id number], DOB:  [birth year], LOS: 0 ADMISSION DATE:  09/15/2020, CONSULTATION DATE:  4/29 REFERRING MD:  Maralyn Sago, CHIEF COMPLAINT:  Pleural effusion and dyspnea   History of Present Illness:  87 year old male who presented to ED on 4/29 w/ about 1 month h/o progressive SOB & LE edema. Went to PCP who sent to ER for further eval.  In the ER cbc w/out leukocytosis, BNP 238, trop neg, Lactic acid 1.1, CT chest showed large loculated right effusion w/ R lateral pleural thickening, and RLL consolidation, and LUL lung mass w/ mediastinal involvement and small left effusion. There was also small low density foci w/in the right liver. Because of this PCCM was consulted.   Pertinent  Medical History  HTN, Hyperlipidemia, aortic stenosis, BPH, Diabetes, GERD, Prostate cancer s/p seed implant, inguinal hernia   Significant Hospital Events: Including procedures, antibiotic start and stop dates in addition to other pertinent events   . 4/29 Pulm consulted for pleural effusion and dyspnea   Interim History / Subjective:    Objective   Blood pressure (Abnormal) 158/94, pulse 97, temperature 98.2 F (36.8 C), temperature source Oral, resp. rate (Abnormal) 22, SpO2 92 %.       No intake or output data in the 24 hours ending 09/15/20 1831 There were no vitals filed for this visit.  Examination: Gen. Pleasant, well-nourished, in no distress, normal affect ENT - no pallor,icterus, hard of hearing Neck: No JVD, no thyromegaly, no carotid bruits Lungs: no use of accessory muscles, dull on right one third, decreased breath sounds on right without rales or rhonchi  Cardiovascular: Rhythm regular, heart sounds  normal, no murmurs or gallops, no peripheral edema Abdomen: soft and non-tender, no hepatosplenomegaly, BS normal. Musculoskeletal: No deformities, no cyanosis or clubbing Neuro:  alert, non focal   Labs/imaging that I havepersonally reviewed  (right click and  "Reselect all SmartList Selections" daily)   Chest x-ray shows large effusion layering along right chest wall with some degree of loculation Labs show LDH 122, protein 6.1, normal electrolytes, no leukocytosis,  Resolved Hospital Problem list     Assessment & Plan:  large loculated right effusion, Right lateral pleural thickening vs mass. LUL mass, left sided adenopathy, mod sized left effusion w/ associated dyspnea  Plan Supplemental oxygen  US guided thoracentesis performed by me at bedside with removal of 900 cc of sanguinous fluid Pleural fluid sent for chemistry, cell count and cytology Unfortunately it does seem like we are dealing with malignancy here  Best practice (right click and "Reselect all SmartList Selections" daily)  Per primary  Labs   CBC: Recent Labs  Lab 09/15/20 1132 09/15/20 1550  WBC 5.7 7.4  NEUTROABS  --  5.8  HGB 12.2* 12.5*  HCT 37.5* 40.7  MCV 79.6 83.9  PLT 203.0 782    Basic Metabolic Panel: Recent Labs  Lab 09/15/20 1132 09/15/20 1550  NA 139 138  K 3.9 3.7  CL 100 100  CO2 31 30  GLUCOSE 104* 128*  BUN 26* 26*  CREATININE 0.98 0.95  CALCIUM 9.4 9.3   GFR: Estimated Creatinine Clearance: 54.4 mL/min (by C-G formula based on SCr of 0.95 mg/dL). Recent Labs  Lab 09/15/20 1132 09/15/20 1550  WBC 5.7 7.4  LATICACIDVEN  --  1.1    Liver Function Tests: Recent Labs  Lab 09/15/20 1132 09/15/20 1550  AST 11 15  ALT 12 15  ALKPHOS 59 62  BILITOT 0.6  0.5  PROT 6.7 7.1  ALBUMIN 3.9 3.9   No results for input(s): LIPASE, AMYLASE in the last 168 hours. No results for input(s): AMMONIA in the last 168 hours.  ABG    Component Value Date/Time   HCO3 32.4 (H) 09/15/2020 1556   O2SAT 76.2 09/15/2020 1556     Coagulation Profile: No results for input(s): INR, PROTIME in the last 168 hours.  Cardiac Enzymes: No results for input(s): CKTOTAL, CKMB, CKMBINDEX, TROPONINI in the last 168 hours.  HbA1C: Hgb A1c MFr Bld   Date/Time Value Ref Range Status  09/15/2020 11:32 AM 6.2 4.6 - 6.5 % Final    Comment:    Glycemic Control Guidelines for People with Diabetes:Non Diabetic:  <6%Goal of Therapy: <7%Additional Action Suggested:  >8%   11/26/2019 03:49 PM 6.0 (H) <5.7 % of total Hgb Final    Comment:    For someone without known diabetes, a hemoglobin  A1c value between 5.7% and 6.4% is consistent with prediabetes and should be confirmed with a  follow-up test. . For someone with known diabetes, a value <7% indicates that their diabetes is well controlled. A1c targets should be individualized based on duration of diabetes, age, comorbid conditions, and other considerations. . This assay result is consistent with an increased risk of diabetes. . Currently, no consensus exists regarding use of hemoglobin A1c for diagnosis of diabetes for children. .     CBG: No results for input(s): GLUCAP in the last 168 hours.  Review of Systems:   Shortness of breath for 1 to 2 weeks Dry cough Denies chest pain, palpitations, hemoptysis No objective weight loss but he does feel like he has lost some, no fevers, anorexia. No hematemesis, melena, abdominal pain, nausea vomiting No dizziness, headaches  Past Medical History:  He,  has a past medical history of BENIGN PROSTATIC HYPERTROPHY, Cataract, DIVERTICULOSIS, COLON, DM, GERD, Heart murmur, Hernia, HYPERCHOLESTEROLEMIA, HYPERTENSION, Moderate aortic stenosis (10/30/2011), and Prostate cancer ([place]).   Surgical History:   Past Surgical History:  Procedure Laterality Date  . CATARACT EXTRACTION W/ INTRAOCULAR LENS IMPLANT  02/2013   R, then L 4 weeks later - [facility]  . INGUINAL HERNIA REPAIR     Right  . INGUINAL HERNIA REPAIR Left 07/15/2014   Procedure: LEFT INGUINAL HERNIA REPAIR WITH MESH;  Surgeon: Armandina Gemma, MD;  [facility];  Service: General;  Laterality: Left;  . INSERTION OF MESH Left 07/15/2014   Procedure: INSERTION OF MESH;   Surgeon: Armandina Gemma, MD;  [facility]: [facility];  Service: General;  Laterality: Left;  . RADIOACTIVE SEED IMPLANT       Social History:   reports that he quit smoking about 53 years ago. His smoking use included cigarettes. He has a 17.50 pack-year smoking history. He has never used smokeless tobacco. He reports that he does not drink alcohol and does not use drugs.   Family History:  His family history is negative for Colon cancer.   Allergies Allergies  Allergen Reactions  . Lisinopril     REACTION: Rash     Home Medications  Prior to Admission medications   Medication Sig Start Date End Date Taking? Authorizing Provider  albuterol (VENTOLIN HFA) 108 (90 Base) MCG/ACT inhaler INHALE 1 TO 2 PUFFS BY MOUTH EVERY 6 HOURS AS NEEDED FOR WHEEZING FOR SHORTNESS OF BREATH Patient taking differently: Inhale 2 puffs into the lungs every 6 (six) hours as needed for wheezing or shortness of breath. 09/15/20  Yes [name],  MD  amLODipine (NORVASC) 5 MG tablet Take 1 tablet (5 mg total) by mouth daily. 11/26/19  Yes [name], MD  aspirin EC 81 MG tablet Take 81 mg by mouth daily.   Yes [provider]  fluticasone (FLONASE) 50 MCG/ACT nasal spray Place 2 sprays into both nostrils daily. 08/04/18  Yes [name], MD  losartan-hydrochlorothiazide (HYZAAR) 100-25 MG tablet Take 1 tablet by mouth daily. 11/26/19  Yes [name], MD  Multiple Vitamin (MULTIVITAMIN WITH MINERALS) TABS tablet Take 1 tablet by mouth daily.   Yes [provider]  simvastatin (ZOCOR) 20 MG tablet Take 1 tablet (20 mg total) by mouth daily at 6 PM. 11/26/19  Yes [name], MD  tamsulosin (FLOMAX) 0.4 MG CAPS capsule Take 0.4 mg by mouth daily. 07/21/20  Yes [provider]     Kara Mead MD. FCCP. [facility] Pager : [phone]  If no response to pager , please call [phone] until 7 pm After 7:00 pm call Elink  [phone]    09/16/2020

## 2020-09-15 NOTE — ED Notes (Signed)
Vitals updated. Nobie Putnam, RN messaged. Waiting for reply.

## 2020-09-15 NOTE — Assessment & Plan Note (Signed)
Ordered CBC, CMP, BNP, ECHO, CXR. CXR prelim read with fluid right lung obscured and left lung with fluid and cardiomegaly. Needs to proceed to ER given significant dyspnea and abnormal CXR.

## 2020-09-15 NOTE — Plan of Care (Signed)
  Problem: Pain Managment: Goal: General experience of comfort will improve Outcome: Progressing   

## 2020-09-15 NOTE — H&P (Signed)
History and Physical  Patient Name: Adam Briggs     KGM:[id number]    DOB: [birth year]    DOA: 09/15/2020 PCP: [name], MD  Patient coming from: Home  Chief Complaint: Dyspnea    HPI: Adam Briggs is a 87 y.o. male, with PMH of hypertension, dyslipidemia, aortic stenosis, likely undiagnosed COPD who presented to the ER on 09/15/2020 with dyspnea.  Patient states that over the past week or so he has had progressive worsening of dyspnea.  He has tried using his home albuterol inhaler with no significant improvement.  Additionally he noted lower extremity edema just over the past day or 2.  He says he has been urinating fine.  He denies any fevers or chills.  He occasionally has had episodes of wheezing over the past year or 2 but resolved with breathing treatments and antibiotics.  He denies having diagnosed COPD but is on as needed inhaler at home.  He also complained of some generalized weakness.  He also has a history of aortic stenosis but has not followed up on in a while due to [place].  Denies previous history of atrial fibrillation. Due to his symptoms he presented to his PCP.  He had a chest x-ray done that showed right lung severe edema, cardiomegaly.  Thus it is recommended him go to the ED for further evaluation.    ED course: -Vitals on admission: Heart rate 118, respiratory rate 18, blood pressure 146/77, O2 sat 96 percent on room air -Labs on initial presentation: Sodium 138, potassium 3.7, chloride 100, bicarb 30, glucose 128, BUN 26, creatinine 0.95, calcium 9.3, BNP 238, WBC 7.4, hemoglobin 12.5, platelets 230, COVID-negative -Imaging obtained on admission: X-ray demonstrated large loculated right pleural effusion.  CT chest with contrast demonstrated loculated right pleural effusion with irregular pleural thickening suspicious for neoplastic process, moderate left pleural effusion, and highly suspicious irregular mass in the left upper lobe -In the ED the patient was  placed on O2 supplementation.  Critical care/pulmonology was contacted for evaluation and recommended admitting to the hospitalist service with plan for thoracentesis tomorrow and will see after.  Thus the hospitalist service was contacted for further evaluation and management.     ROS: A complete and thorough 12 point review of systems obtained, negative listed in HPI.     Past Medical History:  Diagnosis Date  . BENIGN PROSTATIC HYPERTROPHY   . Cataract   . DIVERTICULOSIS, COLON   . DM   . GERD   . Heart murmur   . Hernia   . HYPERCHOLESTEROLEMIA   . HYPERTENSION   . Moderate aortic stenosis 10/30/2011   echo 2013  . Prostate cancer [facility])    s/p seed implant    Past Surgical History:  Procedure Laterality Date  . CATARACT EXTRACTION W/ INTRAOCULAR LENS IMPLANT  02/2013   R, then L 4 weeks later - [place]  . INGUINAL HERNIA REPAIR     Right  . INGUINAL HERNIA REPAIR Left 07/15/2014   Procedure: LEFT INGUINAL HERNIA REPAIR WITH MESH;  Surgeon: Armandina Gemma, MD;  Location: [facility] ORS;  Service: General;  Laterality: Left;  . INSERTION OF MESH Left 07/15/2014   Procedure: INSERTION OF MESH;  Surgeon: Armandina Gemma, MD;  Location: [facility] OR[facility];  Service: General;  Laterality: Left;  . RADIOACTIVE SEED IMPLANT      Social History: Patient lives at home.  The patient walks without assistance.  Former smoker, but quit over 40 years ago.  Allergies  Allergen Reactions  . Lisinopril     REACTION: Rash    Family history: family history is not on file.  Prior to Admission medications   Medication Sig Start Date End Date Taking? Authorizing Provider  albuterol (VENTOLIN HFA) 108 (90 Base) MCG/ACT inhaler INHALE 1 TO 2 PUFFS BY MOUTH EVERY 6 HOURS AS NEEDED FOR WHEEZING FOR SHORTNESS OF BREATH Patient taking differently: Inhale 2 puffs into the lungs every 6 (six) hours as needed for wheezing or shortness of breath. 09/15/20  Yes [name], MD  amLODipine (NORVASC) 5 MG tablet  Take 1 tablet (5 mg total) by mouth daily. 11/26/19  Yes [name], MD  aspirin EC 81 MG tablet Take 81 mg by mouth daily.   Yes [provider]  fluticasone (FLONASE) 50 MCG/ACT nasal spray Place 2 sprays into both nostrils daily. 08/04/18  Yes [name], MD  losartan-hydrochlorothiazide (HYZAAR) 100-25 MG tablet Take 1 tablet by mouth daily. 11/26/19  Yes [name], MD  Multiple Vitamin (MULTIVITAMIN WITH MINERALS) TABS tablet Take 1 tablet by mouth daily.   Yes [provider]  simvastatin (ZOCOR) 20 MG tablet Take 1 tablet (20 mg total) by mouth daily at 6 PM. 11/26/19  Yes [name], MD  tamsulosin (FLOMAX) 0.4 MG CAPS capsule Take 0.4 mg by mouth daily. 07/21/20  Yes [provider]       Physical Exam: BP (!) 158/94   Pulse 97   Temp 98.2 F (36.8 C) (Oral)   Resp (!) 22   SpO2 92%   General appearance: Well-developed, adult male, alert and in no significant acute distress .   Eyes: Anicteric, conjunctiva pink, lids and lashes normal. PERRL.    ENT: No nasal deformity, discharge, epistaxis.  Hearing intact. OP moist without lesions.   Neck: No neck masses.  Trachea midline.  No thyromegaly/tenderness. Lymph: No cervical or supraclavicular lymphadenopathy. Skin: Warm and dry.  No jaundice.  No suspicious rashes or lesions. Cardiac:  Tachycardic, irregularly irregular, nl K0-X3, systolic murmur noted murmurs appreciated.    1/2+ LE edema.  Radial and pedal pulses 2+ and symmetric. Respiratory: Normal respiratory rate and rhythm.  CTAB without rales or wheezes. Abdomen: Abdomen soft.  No tenderness with palpation. No ascites, distension, hepatosplenomegaly.   MSK: No deformities or effusions of the large joints of the upper or lower extremities bilaterally.  No cyanosis or clubbing. Neuro: Cranial nerves 2 through 12 grossly intact.  Sensation intact to light touch. Speech is fluent.  Adam Briggs    Psych: Sensorium intact and  responding to questions, attention normal.  Behavior appropriate.  Judgment and insight appear normal.    Labs on Admission:  I have personally reviewed following labs and imaging studies: CBC: Recent Labs  Lab 09/15/20 1132 09/15/20 1550  WBC 5.7 7.4  NEUTROABS  --  5.8  HGB 12.2* 12.5*  HCT 37.5* 40.7  MCV 79.6 83.9  PLT 203.0 818   Basic Metabolic Panel: Recent Labs  Lab 09/15/20 1132 09/15/20 1550  NA 139 138  K 3.9 3.7  CL 100 100  CO2 31 30  GLUCOSE 104* 128*  BUN 26* 26*  CREATININE 0.98 0.95  CALCIUM 9.4 9.3   GFR: Estimated Creatinine Clearance: 54.4 mL/min (by C-G formula based on SCr of 0.95 mg/dL).  Liver Function Tests: Recent Labs  Lab 09/15/20 1132 09/15/20 1550  AST 11 15  ALT 12 15  ALKPHOS 59 62  BILITOT 0.6 0.5  PROT  6.7 7.1  ALBUMIN 3.9 3.9   No results for input(s): LIPASE, AMYLASE in the last 168 hours. No results for input(s): AMMONIA in the last 168 hours. Coagulation Profile: No results for input(s): INR, PROTIME in the last 168 hours. Cardiac Enzymes: No results for input(s): CKTOTAL, CKMB, CKMBINDEX, TROPONINI in the last 168 hours. BNP (last 3 results) Recent Labs    09/15/20 1132  PROBNP 238.0*   HbA1C: Recent Labs    09/15/20 1132  HGBA1C 6.2   CBG: No results for input(s): GLUCAP in the last 168 hours. Lipid Profile: No results for input(s): CHOL, HDL, LDLCALC, TRIG, CHOLHDL, LDLDIRECT in the last 72 hours. Thyroid Function Tests: No results for input(s): TSH, T4TOTAL, FREET4, T3FREE, THYROIDAB in the last 72 hours. Anemia Panel: No results for input(s): VITAMINB12, FOLATE, FERRITIN, TIBC, IRON, RETICCTPCT in the last 72 hours.   Recent Results (from the past 240 hour(s))  Resp Panel by RT-PCR (Flu A&B, Covid) Nasopharyngeal Swab     Status: None   Collection Time: 09/15/20  3:43 PM   Specimen: Nasopharyngeal Swab; Nasopharyngeal(NP) swabs in vial transport medium  Result Value Ref Range Status   SARS  Coronavirus 2 by RT PCR NEGATIVE NEGATIVE Final    Comment: (NOTE) SARS-CoV-2 target nucleic acids are NOT DETECTED.  The SARS-CoV-2 RNA is generally detectable in upper respiratory specimens during the acute phase of infection. The lowest concentration of SARS-CoV-2 viral copies this assay can detect is 138 copies/mL. A negative result does not preclude SARS-Cov-2 infection and should not be used as the sole basis for treatment or other patient management decisions. A negative result may occur with  improper specimen collection/handling, submission of specimen other than nasopharyngeal swab, presence of viral mutation(s) within the areas targeted by this assay, and inadequate number of viral copies(<138 copies/mL). A negative result must be combined with clinical observations, patient history, and epidemiological information. The expected result is Negative.  Fact Sheet for Patients:  EntrepreneurPulse.com.au  Fact Sheet for Healthcare Providers:  IncredibleEmployment.be  This test is no t yet approved or cleared by the [place] FDA and  has been authorized for detection and/or diagnosis of SARS-CoV-2 by FDA under an Emergency Use Authorization (EUA). This EUA will remain  in effect (meaning this test can be used) for the duration of the COVID-19 declaration under Section 564(b)(1) of the Act, 21 U.S.C.section 360bbb-3(b)(1), unless the authorization is terminated  or revoked sooner.       Influenza A by PCR NEGATIVE NEGATIVE Final   Influenza B by PCR NEGATIVE NEGATIVE Final    Comment: (NOTE) The Xpert Xpress SARS-CoV-2/FLU/RSV plus assay is intended as an aid in the diagnosis of influenza from Nasopharyngeal swab specimens and should not be used as a sole basis for treatment. Nasal washings and aspirates are unacceptable for Xpert Xpress SARS-CoV-2/FLU/RSV testing.  Fact Sheet for  Patients: EntrepreneurPulse.com.au  Fact Sheet for Healthcare Providers: IncredibleEmployment.be  This test is not yet approved or cleared by the [place] FDA and has been authorized for detection and/or diagnosis of SARS-CoV-2 by FDA under an Emergency Use Authorization (EUA). This EUA will remain in effect (meaning this test can be used) for the duration of the COVID-19 declaration under Section 564(b)(1) of the Act, 21 U.S.C. section 360bbb-3(b)(1), unless the authorization is terminated or revoked.  Performed at [facility], [place] [address], [place], [place] [address]            Radiological Exams on Admission: Personally reviewed  imaging which shows: Large right pleural effusion.  Moderate left pleural effusion.  Suspicious lesion on the right and suspicious mass on the left. DG Chest 2 View  Result Date: 09/15/2020 CLINICAL DATA:  Shortness of breath. EXAM: CHEST - 2 VIEW COMPARISON:  January 16, 2012. FINDINGS: The heart size and mediastinal contours are within normal limits. No pneumothorax is noted. Large loculated right pleural effusion is noted with associated right basilar atelectasis or infiltrate. Minimal left basilar subsegmental atelectasis is noted with probable small left pleural effusion. The visualized skeletal structures are unremarkable. IMPRESSION: Large loculated right pleural effusion is noted with associated right basilar atelectasis or infiltrate. Electronically Signed   By: Marijo Conception M.D.   On: 09/15/2020 15:52   CT Chest W Contrast  Result Date: 09/15/2020 CLINICAL DATA:  Chest x-ray showing pleural effusion. EXAM: CT CHEST WITH CONTRAST TECHNIQUE: Multidetector CT imaging of the chest was performed during intravenous contrast administration. CONTRAST:  32mL OMNIPAQUE IOHEXOL 300 MG/ML  SOLN COMPARISON:  Chest x-ray from earlier same day. FINDINGS: Cardiovascular: Aortic atherosclerosis. No  thoracic aortic aneurysm. No pericardial effusion. Scattered coronary artery calcifications. Mediastinum/Nodes: Numerous small lymph nodes within the mediastinum, majority with short axis measurements of less than 1 cm. Largest lymph node is seen within the upper LEFT paratracheal region with a short axis measurement of 1.3 cm (series 2, image 32). Esophagus is unremarkable.  Trachea is unremarkable. Lungs/Pleura: Loculated RIGHT pleural effusion, moderate to large in size. Eccentric irregular pleural thickening at the RIGHT lateral lung base, at the periphery of the loculated pleural effusion (series 2, image 106), suspicious for neoplastic process. Additional masslike consolidations within the RIGHT lower lung, at the medial aspects of the loculated pleural effusion, most likely rib associated rounded atelectasis but some component may represent neoplastic mass. Irregular mass within the LEFT upper lobe, medial aspect, measuring 2 cm greatest dimension, almost certainly neoplastic. This abuts the LEFT mediastinal margin and there may be slight extension of this mass into the mediastinum. Additional layering LEFT pleural effusion, moderate in size. Upper Abdomen: Small low-density foci within the RIGHT liver lobe (series 2, images 150 and 151) w, suspicious for metastases. Splenomegaly. Musculoskeletal: No acute or suspicious osseous finding. IMPRESSION: 1. Loculated RIGHT pleural effusion, moderate to large in size. Eccentric irregular pleural thickening at the RIGHT lateral lung base, located at the periphery of this loculated pleural effusion, highly suspicious for neoplastic process at the pleural surface (primary or metastatic) with associated malignant effusion. 2. Additional larger masslike consolidations within the RIGHT lower lung, at the medial aspects of the loculated pleural effusion, most likely rounded atelectasis but some component may represent neoplastic mass. 3. Highly suspicious irregular mass  within the LEFT upper lobe, medial aspect, measuring 2 cm greatest dimension, almost certainly neoplastic. This abuts the LEFT mediastinal margin and may extend into the mediastinum 4. Additional layering LEFT pleural effusion, moderate in size, also suspicious for malignant effusion. 5. Small low-density foci within the RIGHT liver lobe, suspicious for hepatic metastases. 6. Splenomegaly. Aortic Atherosclerosis (ICD10-I70.0). Electronically Signed   By: Franki Cabot M.D.   On: 09/15/2020 18:32    EKG: Independently reviewed.  Atrial fib       Assessment/Plan   1.  Acute hypoxic respiratory failure -Likely secondary to large right and moderate left pleural effusions and concerning lung lesions with underlying undiagnosed COPD - Has been requiring O2 supplementation in the ER to maintain saturation - CT demonstrated moderate to large located right pleural effusion and  moderate pleural effusion on the left - O2 as tolerated - Pulmonology consulted - Plan for thoracentesis tomorrow - N.p.o. at midnight - Started on Lasix 40 mg PO daily - Strict I's and O's, daily weights - Scheduled and as needed breathing treatment  2.  Large right loculated pleural effusion and moderate left pleural effusion - CT demonstrated moderate to large located right pleural effusion and moderate pleural effusion on the left - Plan for thoracentesis tomorrow - See further plans above  3.  Left upper lobe lung mass and right lung lesion - CT demonstrated irregular pleural thickening at the right lateral lung base and highly suspicious irregular mass within the left upper lobe - Pulmonology consulted - Suspect will need bronchoscopy - We will need oncology consult once diagnosis obtained - See further plans above  4.  New onset atrial fibrillation -Patient presents with new onset A. fib.  No previous diagnosis - We will start on metoprolol 5 mg IV Q6H PRN for HR >110 -We will hold off on anticoagulation at  this time given planned thoracentesis and will tentatively plan to start anticoagulation afterwards - Telemetry  5.  Aortic stenosis - Previously diagnosed with aortic stenosis elevated but has not seen in it appears 7 years - Echocardiogram ordered  6.  Essential hypertension -Slightly above goal - Continue home Norvasc - We will hold home losartan/HCTZ given tachycardia, limiting AKI risk, and starting on Lasix as a diuretic as above - Continue close monitoring  7.  Dyslipidemia -Continue home statin    DVT prophylaxis: SCDs with plan to transition to full anticoagulation after thoracentesis, Code Status: Full Family Communication: Daughter bedside Disposition Plan: Anticipate discharge home when medically optimized Consults called: Critical care/pulmonology contacted by ER Admission status: Progressive/stepdown     Medical decision making: Patient seen at 7:45 PM on 09/15/2020.  The patient was discussed with ER provider.  What exists of the patient's chart was reviewed in depth and summarized above.  Clinical condition: Watcher.        Doran Heater [facility] Please page though [place] or Epic secure chat:  For password, contact charge nurse

## 2020-09-15 NOTE — ED Triage Notes (Signed)
Pt arrived via POV< c/o SOB ongoing for months, states he believed it was a allergy issue but had xray today that showed fluid on lungs.

## 2020-09-15 NOTE — Assessment & Plan Note (Signed)
Checking HgA1c as due.

## 2020-09-15 NOTE — Assessment & Plan Note (Signed)
Ordered ECHO to assess. He does have new SOB which could be symptom of this.

## 2020-09-15 NOTE — Progress Notes (Signed)
   Subjective:   Patient ID: Adam Briggs, male    DOB: [birth year], 87 y.o.   MRN: [id number]  HPI The patient is an 87 YO man coming in for SOB which has been progressive over months but severe recently. Using albuterol to help but this does not help much. Prior moderate aortic stenosis has not been followed up for about 8 years. Denies chest pains. Mild cough with sputum in the last week. Denies fevers or chills. Some mild swelling in his legs which he has never had before started yesterday  Prelim cxr read: right lung severe edema, left lung mild edema, cardiomegaly, significant change to prior 2013  Review of Systems  Constitutional: Positive for activity change, appetite change, fatigue and unexpected weight change.  HENT: Negative.   Eyes: Negative.   Respiratory: Positive for cough and shortness of breath. Negative for chest tightness.   Cardiovascular: Negative for chest pain, palpitations and leg swelling.  Gastrointestinal: Negative for abdominal distention, abdominal pain, constipation, diarrhea, nausea and vomiting.  Musculoskeletal: Negative.   Skin: Negative.   Neurological: Positive for weakness.  Psychiatric/Behavioral: Negative.     Objective:  Physical Exam Constitutional:      Appearance: He is well-developed. He is ill-appearing.  HENT:     Head: Normocephalic and atraumatic.  Cardiovascular:     Rate and Rhythm: Normal rate and regular rhythm.  Pulmonary:     Effort: Respiratory distress present.     Breath sounds: Rales present. No wheezing.     Comments: Poor lung sounds on the right, rales on the left base, cannot speak in full sentences and able to walk but gets winded quickly Abdominal:     General: Bowel sounds are normal. There is no distension.     Palpations: Abdomen is soft.     Tenderness: There is no abdominal tenderness. There is no rebound.  Musculoskeletal:     Cervical back: Normal range of motion.     Comments: 1+ edema ankles bilateral   Skin:    General: Skin is warm and dry.  Neurological:     Mental Status: He is alert and oriented to person, place, and time.     Coordination: Coordination normal.     Vitals:   09/15/20 1049  BP: 132/90  Pulse: (!) 110  Temp: 98.2 F (36.8 C)  TempSrc: Oral  SpO2: 95%  Weight: 168 lb 6.4 oz (76.4 kg)  Height: 5\' 10"  (1.778 m)    This visit occurred during the SARS-CoV-2 public health emergency.  Safety protocols were in place, including screening questions prior to the visit, additional usage of staff PPE, and extensive cleaning of exam room while observing appropriate contact time as indicated for disinfecting solutions.   Assessment & Plan:

## 2020-09-15 NOTE — ED Triage Notes (Signed)
Emergency Medicine Provider Triage Evaluation Note  Adam Briggs , a 87 y.o. male  was evaluated in triage.  Pt complains of shortness of breath, For last couple days, states that is worse when he bends over, has worsening pedal edema thinks he has a lot of fluid on his lungs.  Denies chest pain, headaches, fevers or chills.  Review of Systems  Positive: Shortness of breath, pedal edema Negative: Chest pain, fevers, chills  Physical Exam  BP (!) 146/77 (BP Location: Right Arm)   Pulse (!) 118   Temp 98.2 F (36.8 C) (Oral)   Resp 18   SpO2 96%  Gen:   Awake, no distress   HEENT:  Atraumatic  Resp:  Normal effort  Cardiac:  Normal rate  MSK:   Moves extremities without difficulty  Neuro:  Speech clear   Medical Decision Making  Medically screening exam initiated at 1:29 PM.  Appropriate orders placed.  Bethann Goo was informed that the remainder of the evaluation will be completed by another provider, this initial triage assessment does not replace that evaluation, and the importance of remaining in the ED until their evaluation is complete.  Clinical Impression  Shortness of breath, lab work has been ordered, patient will need further work-up here in the emergency department.   Marcello Fennel, PA-C 09/15/20 1330

## 2020-09-15 NOTE — ED Provider Notes (Signed)
[facility] DEPT Provider Note   CSN: [id number] Arrival date & time: 09/15/20  1241     History Chief Complaint  Patient presents with  . Shortness of Breath    Adam Briggs is a 87 y.o. male.  87 year old male with history of hypertension, hyperlipidemia, aortic stenosis, with complaint of shortness of breath and lower extremity edema.  Shortness of breath onset a month or so ago, progressively worsening, denies orthopnea, reports dyspnea with exertion.  Leg swelling onset yesterday, no history of similar previously, denies history of CHF.  Patient went to his PCP today who sent him to the emergency room for further evaluation, labs and chest x-ray obtained outpatient today.        Past Medical History:  Diagnosis Date  . BENIGN PROSTATIC HYPERTROPHY   . Cataract   . DIVERTICULOSIS, COLON   . DM   . GERD   . Heart murmur   . Hernia   . HYPERCHOLESTEROLEMIA   . HYPERTENSION   . Moderate aortic stenosis 10/30/2011   echo 2013  . Prostate cancer [facility])    s/p seed implant    Patient Active Problem List   Diagnosis Date Noted  . SOB (shortness of breath) 09/15/2020  . Wheezing 04/24/2019  . Routine general medical examination at a health care facility 06/23/2014  . Inguinal hernia 05/05/2014  . Moderate aortic stenosis 10/30/2011  . Abnormal ECG 09/24/2010  . Type 2 diabetes mellitus with hyperlipidemia ([place]) 03/26/2010  . Hyperlipidemia associated with type 2 diabetes mellitus ([place]) 12/28/2007  . Essential hypertension 02/07/2007    Past Surgical History:  Procedure Laterality Date  . CATARACT EXTRACTION W/ INTRAOCULAR LENS IMPLANT  02/2013   R, then L 4 weeks later - [place]  . INGUINAL HERNIA REPAIR     Right  . INGUINAL HERNIA REPAIR Left 07/15/2014   Procedure: LEFT INGUINAL HERNIA REPAIR WITH MESH;  Surgeon: Armandina Gemma, MD;  Location: [facility];  Service: General;  Laterality: Left;  . INSERTION OF MESH Left 07/15/2014    Procedure: INSERTION OF MESH;  Surgeon: Armandina Gemma, MD;  Location: [facility];  Service: General;  Laterality: Left;  . RADIOACTIVE SEED IMPLANT         Family History  Problem Relation Age of Onset  . Colon cancer Neg Hx     Social History   Tobacco Use  . Smoking status: Former Smoker    Packs/day: 0.50    Years: 35.00    Pack years: 17.50    Types: Cigarettes    Quit date: 10/30/1966    Years since quitting: 53.9  . Smokeless tobacco: Never Used  Substance Use Topics  . Alcohol use: No  . Drug use: No    Home Medications Prior to Admission medications   Medication Sig Start Date End Date Taking? Authorizing Provider  albuterol (VENTOLIN HFA) 108 (90 Base) MCG/ACT inhaler INHALE 1 TO 2 PUFFS BY MOUTH EVERY 6 HOURS AS NEEDED FOR WHEEZING FOR SHORTNESS OF BREATH Patient taking differently: Inhale 2 puffs into the lungs every 6 (six) hours as needed for wheezing or shortness of breath. 09/15/20  Yes [name], MD  amLODipine (NORVASC) 5 MG tablet Take 1 tablet (5 mg total) by mouth daily. 11/26/19  Yes [name], MD  aspirin EC 81 MG tablet Take 81 mg by mouth daily.   Yes [provider]  fluticasone (FLONASE) 50 MCG/ACT nasal spray Place 2 sprays into both nostrils daily.  08/04/18  Yes [name], MD  losartan-hydrochlorothiazide (HYZAAR) 100-25 MG tablet Take 1 tablet by mouth daily. 11/26/19  Yes [name], MD  Multiple Vitamin (MULTIVITAMIN WITH MINERALS) TABS tablet Take 1 tablet by mouth daily.   Yes [provider]  simvastatin (ZOCOR) 20 MG tablet Take 1 tablet (20 mg total) by mouth daily at 6 PM. 11/26/19  Yes [name], MD  tamsulosin (FLOMAX) 0.4 MG CAPS capsule Take 0.4 mg by mouth daily. 07/21/20  Yes [provider]    Allergies    Lisinopril  Review of Systems   Review of Systems  Constitutional: Negative for chills, diaphoresis and fever.  Respiratory: Positive for cough and  shortness of breath.   Cardiovascular: Positive for leg swelling. Negative for chest pain and palpitations.  Gastrointestinal: Negative for abdominal pain, constipation, diarrhea, nausea and vomiting.  Genitourinary: Negative for difficulty urinating and dysuria.  Musculoskeletal: Negative for arthralgias and myalgias.  Skin: Negative for rash and wound.  Allergic/Immunologic: Positive for immunocompromised state.  Neurological: Negative for weakness.  Hematological: Negative for adenopathy.  Psychiatric/Behavioral: Negative for confusion.  All other systems reviewed and are negative.   Physical Exam Updated Vital Signs BP (!) 150/92   Pulse (!) 104   Temp 98.2 F (36.8 C) (Oral)   Resp 16   SpO2 93%   Physical Exam Vitals and nursing note reviewed.  Constitutional:      General: He is not in acute distress.    Appearance: He is well-developed. He is not diaphoretic.  HENT:     Head: Normocephalic and atraumatic.  Cardiovascular:     Rate and Rhythm: Regular rhythm. Tachycardia present.     Heart sounds: Murmur heard.    Pulmonary:     Effort: Tachypnea and accessory muscle usage present.     Breath sounds: Examination of the right-upper field reveals rhonchi. Examination of the left-upper field reveals rhonchi. Examination of the right-middle field reveals decreased breath sounds. Examination of the left-middle field reveals rhonchi. Examination of the right-lower field reveals decreased breath sounds. Examination of the left-lower field reveals decreased breath sounds. Decreased breath sounds and rhonchi present.  Chest:     Chest wall: No tenderness.  Abdominal:     Palpations: Abdomen is soft.     Tenderness: There is no abdominal tenderness.  Musculoskeletal:     Right lower leg: Edema present.     Left lower leg: Edema present.     Comments: Pitting edema to just below the knees bilaterally.  Skin:    General: Skin is warm and dry.  Neurological:     Mental  Status: He is alert and oriented to person, place, and time.     Motor: No weakness.  Psychiatric:        Behavior: Behavior normal.     ED Results / Procedures / Treatments   Labs (all labs ordered are listed, but only abnormal results are displayed) Labs Reviewed  COMPREHENSIVE METABOLIC PANEL - Abnormal; Notable for the following components:      Result Value   Glucose, Bld 128 (*)    BUN 26 (*)    All other components within normal limits  CBC WITH DIFFERENTIAL/PLATELET - Abnormal; Notable for the following components:   Hemoglobin 12.5 (*)    MCH 25.8 (*)    RDW 15.9 (*)    All other components within normal limits  BRAIN NATRIURETIC PEPTIDE - Abnormal; Notable for the following components:   B Natriuretic  Peptide 238.0 (*)    All other components within normal limits  BLOOD GAS, VENOUS - Abnormal; Notable for the following components:   Bicarbonate 32.4 (*)    Acid-Base Excess 6.5 (*)    All other components within normal limits  RESP PANEL BY RT-PCR (FLU A&B, COVID) ARPGX2  CULTURE, BLOOD (ROUTINE X 2)  CULTURE, BLOOD (ROUTINE X 2)  LACTIC ACID, PLASMA  TROPONIN I (HIGH SENSITIVITY)  TROPONIN I (HIGH SENSITIVITY)    EKG EKG Interpretation  Date/Time:  Friday September 15 2020 15:37:12 EDT Ventricular Rate:  105 PR Interval:    QRS Duration: 92 QT Interval:  320 QTC Calculation: 422 R Axis:   -26 Text Interpretation: Atrial fibrillation with rapid ventricular response Incomplete right bundle branch block Possible Anteroseptal infarct , age undetermined Abnormal ECG No acute changes afib is new Confirmed by Varney Biles [phone]) on 09/15/2020 3:54:12 PM   Radiology DG Chest 2 View  Result Date: 09/15/2020 CLINICAL DATA:  Shortness of breath. EXAM: CHEST - 2 VIEW COMPARISON:  January 16, 2012. FINDINGS: The heart size and mediastinal contours are within normal limits. No pneumothorax is noted. Large loculated right pleural effusion is noted with associated right  basilar atelectasis or infiltrate. Minimal left basilar subsegmental atelectasis is noted with probable small left pleural effusion. The visualized skeletal structures are unremarkable. IMPRESSION: Large loculated right pleural effusion is noted with associated right basilar atelectasis or infiltrate. Electronically Signed   By: Marijo Conception M.D.   On: 09/15/2020 15:52    Procedures .Critical Care Performed by: Tacy Learn, PA-C Authorized by: Tacy Learn, PA-C   Critical care provider statement:    Critical care time (minutes):  45   Critical care was time spent personally by me on the following activities:  Discussions with consultants, evaluation of patient's response to treatment, examination of patient, ordering and performing treatments and interventions, ordering and review of laboratory studies, ordering and review of radiographic studies, pulse oximetry, re-evaluation of patient's condition, obtaining history from patient or surrogate and review of old charts     Medications Ordered in ED Medications  iohexol (OMNIPAQUE) 300 MG/ML solution 75 mL (75 mLs Intravenous Contrast Given 09/15/20 1805)    ED Course  I have reviewed the triage vital signs and the nursing notes.  Pertinent labs & imaging results that were available during my care of the patient were reviewed by me and considered in my medical decision making (see chart for details).  Clinical Course as of 09/15/20 1823  Fri Sep 15, 2020  1633 Chest x-ray obtained outpatient today reads as heart size and mediastinal contours within normal limits.  No pneumothorax.  Large loculated right pleural effusion noted with associated right basilar atelectasis or infiltrate.  Minimal left basilar subsegmental atelectasis is noted with probable small left pleural effusion. Impression: Large loculated right pleural effusion is noted with associated left basilar atelectasis or infiltrate. [LM]  7820 87 year old male with  complaint of shortness of breath, history assisted by daughter after initial exam who states he has been wheezing for some time now, progressively more short of breath but worse today. Patient declined going to his grandson's baseball game today which concerned daughter and prompted visit to PCP office.  Patient was found to have a large pleural effusion and was sent to the ER. In the ER, patient was found to be in a-fib, this appears new.  [LM]  1719 Respiratory rate likely around 40, O2 sat 88% on  room air after moving around room, patient to be placed on O2. CT chest ordered and is pending, may need antibiotics if infectious cause found however no elevated WBC/no fevers/no infectious symptoms. Afib on ekg, rate currently in the 90s, consider heparin and will discuss with hospitalist. [LM]  1742 Discussed with hospitalist who requests critical care consult, if not accepted page hospitalist back but will also need pulmonology consult for effusion.  [LM]  1791 Patient was placed on 2L [place], he reports ease of respiratory effort, rate now 20-30, O2 sat 97% on 2LNC.  [LM]  1800 Discussed with Dr. Elsworth Soho with critical care, will consult from a pulmonology stand point after pleuracentesis done by IR (likely tomorrow), recommends hospitalist admit with IR consult for pleuracentesis.  [LM]    Clinical Course User Index [LM] Roque Lias   MDM Rules/Calculators/A&P                          Final Clinical Impression(s) / ED Diagnoses Final diagnoses:  Pleural effusion  SOB (shortness of breath)  Atrial fibrillation, unspecified type [facility])  Hypoxia    Rx / DC Orders ED Discharge Orders    None       Tacy Learn, PA-C 09/15/20 [place], MD 09/15/20 2112

## 2020-09-15 NOTE — ED Provider Notes (Signed)
Received in signout from L. Maralyn Sago pending hospitalist consult.  Case discussed with hospitalist Dr. Luna Fuse who accepts patient in admission   Adam Briggs 09/15/20 1836    Drenda Freeze, MD 09/15/20 [phone]

## 2020-09-15 NOTE — Patient Instructions (Signed)
We will check the labs today and the x-ray. We have ordered echo of the heart also.

## 2020-09-16 ENCOUNTER — Inpatient Hospital Stay (HOSPITAL_COMMUNITY): Payer: Medicare Other

## 2020-09-16 DIAGNOSIS — I350 Nonrheumatic aortic (valve) stenosis: Secondary | ICD-10-CM | POA: Diagnosis not present

## 2020-09-16 DIAGNOSIS — J90 Pleural effusion, not elsewhere classified: Secondary | ICD-10-CM | POA: Diagnosis not present

## 2020-09-16 DIAGNOSIS — I4891 Unspecified atrial fibrillation: Secondary | ICD-10-CM | POA: Diagnosis not present

## 2020-09-16 DIAGNOSIS — R918 Other nonspecific abnormal finding of lung field: Secondary | ICD-10-CM | POA: Diagnosis not present

## 2020-09-16 DIAGNOSIS — J9601 Acute respiratory failure with hypoxia: Secondary | ICD-10-CM | POA: Diagnosis not present

## 2020-09-16 LAB — CBC
HCT: 40.1 % (ref 39.0–52.0)
Hemoglobin: 12.7 g/dL — ABNORMAL LOW (ref 13.0–17.0)
MCH: 26 pg (ref 26.0–34.0)
MCHC: 31.7 g/dL (ref 30.0–36.0)
MCV: 82.2 fL (ref 80.0–100.0)
Platelets: 190 10*3/uL (ref 150–400)
RBC: 4.88 MIL/uL (ref 4.22–5.81)
RDW: 15.9 % — ABNORMAL HIGH (ref 11.5–15.5)
WBC: 5.2 10*3/uL (ref 4.0–10.5)
nRBC: 0 % (ref 0.0–0.2)

## 2020-09-16 LAB — LACTATE DEHYDROGENASE, PLEURAL OR PERITONEAL FLUID: LD, Fluid: 134 U/L — ABNORMAL HIGH (ref 3–23)

## 2020-09-16 LAB — COMPREHENSIVE METABOLIC PANEL
ALT: 13 U/L (ref 0–44)
AST: 16 U/L (ref 15–41)
Albumin: 3.3 g/dL — ABNORMAL LOW (ref 3.5–5.0)
Alkaline Phosphatase: 55 U/L (ref 38–126)
Anion gap: 7 (ref 5–15)
BUN: 21 mg/dL (ref 8–23)
CO2: 32 mmol/L (ref 22–32)
Calcium: 8.8 mg/dL — ABNORMAL LOW (ref 8.9–10.3)
Chloride: 99 mmol/L (ref 98–111)
Creatinine, Ser: 0.9 mg/dL (ref 0.61–1.24)
GFR, Estimated: >60 mL/min (ref >60)
Glucose, Bld: 126 mg/dL — ABNORMAL HIGH (ref 70–99)
Potassium: 3.8 mmol/L (ref 3.5–5.1)
Sodium: 138 mmol/L (ref 135–145)
Total Bilirubin: 0.5 mg/dL (ref 0.3–1.2)
Total Protein: 6.1 g/dL — ABNORMAL LOW (ref 6.5–8.1)

## 2020-09-16 LAB — LACTATE DEHYDROGENASE: LDH: 177 U/L (ref 98–192)

## 2020-09-16 LAB — ECHOCARDIOGRAM COMPLETE
AV Mean grad: 26 mmHg
AV Peak grad: 48.2 mmHg
Ao pk vel: 3.47 m/s
Height: 70 in
Weight: 2719.59 oz

## 2020-09-16 LAB — GLUCOSE, CAPILLARY
Glucose-Capillary: 90 mg/dL (ref 70–99)
Glucose-Capillary: 90 mg/dL (ref 70–99)
Glucose-Capillary: 152 mg/dL — ABNORMAL HIGH (ref 70–99)

## 2020-09-16 LAB — BODY FLUID CELL COUNT WITH DIFFERENTIAL
Eos, Fluid: 2 %
Lymphs, Fluid: 92 %
Monocyte-Macrophage-Serous Fluid: 3 % — ABNORMAL LOW (ref 50–90)
Neutrophil Count, Fluid: 3 % (ref 0–25)
Total Nucleated Cell Count, Fluid: 339 cu mm (ref 0–1000)

## 2020-09-16 LAB — PROTEIN, PLEURAL OR PERITONEAL FLUID: Total protein, fluid: <3 g/dL

## 2020-09-16 LAB — PHOSPHORUS: Phosphorus: 3.7 mg/dL (ref 2.5–4.6)

## 2020-09-16 LAB — GLUCOSE, PLEURAL OR PERITONEAL FLUID: Glucose, Fluid: 91 mg/dL

## 2020-09-16 LAB — ALBUMIN, PLEURAL OR PERITONEAL FLUID: Albumin, Fluid: <1 g/dL

## 2020-09-16 LAB — MAGNESIUM: Magnesium: 2 mg/dL (ref 1.7–2.4)

## 2020-09-16 LAB — AMYLASE, PLEURAL OR PERITONEAL FLUID: Amylase, Fluid: 19 U/L

## 2020-09-16 NOTE — Procedures (Signed)
Thoracentesis  Procedure Note  BRIGG CAPE  [id number]  10-08-1929  Date:09/16/20  Time:9:24 AM   Provider Performing:Rhoda Waldvogel V. Shalva Rozycki   Procedure: Thoracentesis with imaging guidance (69794)  Indication(s) Pleural Effusion  Consent Risks of the procedure as well as the alternatives and risks of each were explained to the patient and/or caregiver.  Consent for the procedure was obtained and is signed in the bedside chart  Anesthesia Topical only with 1% lidocaine    Time Out Verified patient identification, verified procedure, site/side was marked, verified correct patient position, special equipment/implants available, medications/allergies/relevant history reviewed, required imaging and test results available.   Sterile Technique Maximal sterile technique including full sterile barrier drape, hand hygiene, sterile gown, sterile gloves, mask, hair covering, sterile ultrasound probe cover (if used).  Procedure Description Ultrasound was used to identify appropriate pleural anatomy for placement and overlying skin marked.  Area of drainage cleaned and draped in sterile fashion. Lidocaine was used to anesthetize the skin and subcutaneous tissue.  900 cc's of sanguinous appearing fluid was drained from the right pleural space. Catheter then removed and bandaid applied to site.   Complications/Tolerance None; patient tolerated the procedure well.  Procedure stopped when patient started complaining of pain on withdrawing fluid , indicating perhaps that we are dealing with trapped lung Chest X-ray is ordered to confirm no post-procedural complication.   EBL Minimal   Specimen(s) Pleural fluid for cytology , LDH, protein  Belia Febo V. Elsworth Soho MD

## 2020-09-16 NOTE — Progress Notes (Signed)
PROGRESS NOTE    ROBIE MCNIEL  WLN:[id number] DOB: 02/15/1930 DOA: 09/15/2020 PCP: [name], MD  Outpatient Specialists:   Brief Narrative:  As per H&P done by Tomasita Crumble: "OWAIS PRUETT is a 87 y.o. male, with PMH of hypertension, dyslipidemia, aortic stenosis, likely undiagnosed COPD who presented to the ER on 09/15/2020 with dyspnea.  Patient states that over the past week or so he has had progressive worsening of dyspnea.  He has tried using his home albuterol inhaler with no significant improvement.  Additionally he noted lower extremity edema just over the past day or 2.  He says he has been urinating fine.  He denies any fevers or chills.  He occasionally has had episodes of wheezing over the past year or 2 but resolved with breathing treatments and antibiotics.  He denies having diagnosed COPD but is on as needed inhaler at home.  He also complained of some generalized weakness.  He also has a history of aortic stenosis but has not followed up on in a while due to [place].  Denies previous history of atrial fibrillation. Due to his symptoms he presented to his PCP.  He had a chest x-ray done that showed right lung severe edema, cardiomegaly.  Thus it is recommended him go to the ED for further evaluation.  ED course: -Vitals on admission: Heart rate 118, respiratory rate 18, blood pressure 146/77, O2 sat 96 percent on room air -Labs on initial presentation: Sodium 138, potassium 3.7, chloride 100, bicarb 30, glucose 128, BUN 26, creatinine 0.95, calcium 9.3, BNP 238, WBC 7.4, hemoglobin 12.5, platelets 230, COVID-negative -Imaging obtained on admission: X-ray demonstrated large loculated right pleural effusion.  CT chest with contrast demonstrated loculated right pleural effusion with irregular pleural thickening suspicious for neoplastic process, moderate left pleural effusion, and highly suspicious irregular mass in the left upper lobe -In the ED the patient was placed  on O2 supplementation.  Critical care/pulmonology was contacted for evaluation and recommended admitting to the hospitalist service with plan for thoracentesis tomorrow and will see after.  Thus the hospitalist service was contacted for further evaluation and management".  09/16/2020: Patient underwent paracentesis today and about 900 serosanguineous fluid was taken out.  Pleural fluid analysis in progress.  Patient breaths a lot better.   Assessment & Plan:   Active Problems:   Essential hypertension   Moderate aortic stenosis   SOB (shortness of breath)   Pleural effusion   Acute respiratory failure with hypoxia (HCC)   Mass of upper lobe of left lung   Atrial fibrillation, new onset ([place])  1.  Acute hypoxic respiratory failure -Likely secondary to large right and moderate left pleural effusions and concerning lung lesions with underlying undiagnosed COPD - Has been requiring O2 supplementation in the ER to maintain saturation - CT demonstrated moderate to large located right pleural effusion and moderate pleural effusion on the left - O2 as tolerated - Pulmonology consulted - Plan for thoracentesis tomorrow - N.p.o. at midnight - Started on Lasix 40 mg PO daily - Strict I's and O's, daily weights - Scheduled and as needed breathing treatment 09/16/2020: Improved significantly following thoracentesis.  Pleural fluid analysis is in progress.  Pulmonary team is directing care.  2.  Large right loculated pleural effusion and moderate left pleural effusion - CT demonstrated moderate to large located right pleural effusion and moderate pleural effusion on the left - Plan for thoracentesis tomorrow - See further plans above 09/16/2020: Status post right-sided thoracentesis.  Follow pleural fluid analysis.  3.  Left upper lobe lung mass and right lung lesion - CT demonstrated irregular pleural thickening at the right lateral lung base and highly suspicious irregular mass within the left  upper lobe - Pulmonology consulted - Suspect will need bronchoscopy - We will need oncology consult once diagnosis obtained - See further plans above 09/16/2020: Worrisome for malignancy.  Pulmonary team is directing care.  4.  New onset atrial fibrillation -Patient presents with new onset A. fib.  No previous diagnosis - We will start on metoprolol 5 mg IV Q6H PRN for HR >110 -We will hold off on anticoagulation at this time given planned thoracentesis and will tentatively plan to start anticoagulation afterwards - Telemetry  5.  Aortic stenosis - Previously diagnosed with aortic stenosis elevated but has not seen in it appears 7 years - Echocardiogram ordered  6.  Essential hypertension -Slightly above goal - Continue home Norvasc - We will hold home losartan/HCTZ given tachycardia, limiting AKI risk, and starting on Lasix as a diuretic as above - Continue close monitoring  7.  Dyslipidemia -Continue home statin  DVT prophylaxis: SCD. Code Status: Full code Family Communication: Daughter Disposition Plan: Will depend on hospital course   Consultants:   Pulmonary/critical care team  Procedures:   Right-sided thoracentesis  Antimicrobials:   None   Subjective: Shortness of breath has improved. No chest pain  Objective: Vitals:   09/16/20 0500 09/16/20 1146 09/16/20 1207 09/16/20 1208  BP:   122/82   Pulse:   95   Resp:   20 (!) 21  Temp:   97.7 F (36.5 C)   TempSrc:   Oral   SpO2:  97% 98%   Weight: 77.1 kg       Intake/Output Summary (Last 24 hours) at 09/16/2020 1232 Last data filed at 09/16/2020 1222 Gross per 24 hour  Intake 0 ml  Output 1640 ml  Net -1640 ml   Filed Weights   09/16/20 0500  Weight: 77.1 kg    Examination:  General exam: Appears calm and comfortable  Respiratory system: Decreased air entry.   Cardiovascular system: S1 & S2, with ejection systolic murmur. No pedal edema. Gastrointestinal system: Abdomen is  nondistended, soft and nontender. No organomegaly or masses felt. Normal bowel sounds heard. Central nervous system: Alert and oriented. No focal neurological deficits. Extremities: No leg edema  Data Reviewed: I have personally reviewed following labs and imaging studies  CBC: Recent Labs  Lab 09/15/20 1132 09/15/20 1550 09/15/20 2047 09/16/20 0359  WBC 5.7 7.4 5.9 5.2  NEUTROABS  --  5.8  --   --   HGB 12.2* 12.5* 12.1* 12.7*  HCT 37.5* 40.7 39.3 40.1  MCV 79.6 83.9 84.2 82.2  PLT 203.0 230 214 614   Basic Metabolic Panel: Recent Labs  Lab 09/15/20 1132 09/15/20 1550 09/16/20 0359  NA 139 138 138  K 3.9 3.7 3.8  CL 100 100 99  CO2 31 30 32  GLUCOSE 104* 128* 126*  BUN 26* 26* 21  CREATININE 0.98 0.95 0.90  CALCIUM 9.4 9.3 8.8*  MG  --   --  2.0  PHOS  --   --  3.7   GFR: Estimated Creatinine Clearance: 57.5 mL/min (by C-G formula based on SCr of 0.9 mg/dL). Liver Function Tests: Recent Labs  Lab 09/15/20 1132 09/15/20 1550 09/15/20 2047 09/16/20 0359  AST 11 15  --  16  ALT 12 15  --  13  ALKPHOS 59  62  --  55  BILITOT 0.6 0.5  --  0.5  PROT 6.7 7.1 6.6 6.1*  ALBUMIN 3.9 3.9 3.6 3.3*   No results for input(s): LIPASE, AMYLASE in the last 168 hours. No results for input(s): AMMONIA in the last 168 hours. Coagulation Profile: Recent Labs  Lab 09/15/20 2047  INR 1.1   Cardiac Enzymes: No results for input(s): CKTOTAL, CKMB, CKMBINDEX, TROPONINI in the last 168 hours. BNP (last 3 results) Recent Labs    09/15/20 1132  PROBNP 238.0*   HbA1C: Recent Labs    09/15/20 1132  HGBA1C 6.2   CBG: Recent Labs  Lab 09/15/20 2107 09/16/20 0751 09/16/20 1133  GLUCAP 148* 90 90   Lipid Profile: No results for input(s): CHOL, HDL, LDLCALC, TRIG, CHOLHDL, LDLDIRECT in the last 72 hours. Thyroid Function Tests: No results for input(s): TSH, T4TOTAL, FREET4, T3FREE, THYROIDAB in the last 72 hours. Anemia Panel: No results for input(s): VITAMINB12,  FOLATE, FERRITIN, TIBC, IRON, RETICCTPCT in the last 72 hours. Urine analysis:    Component Value Date/Time   COLORURINE YELLOW 01/16/2012 1008   APPEARANCEUR CLEAR 01/16/2012 1008   LABSPEC 1.013 01/16/2012 1008   PHURINE 7.0 01/16/2012 1008   GLUCOSEU NEGATIVE 01/16/2012 1008   GLUCOSEU NEGATIVE 09/23/2011 1032   HGBUR NEGATIVE 01/16/2012 1008   HGBUR negative 09/26/2008 0908   BILIRUBINUR NEGATIVE 01/16/2012 1008   KETONESUR NEGATIVE 01/16/2012 1008   PROTEINUR NEGATIVE 01/16/2012 1008   UROBILINOGEN 1.0 01/16/2012 1008   NITRITE NEGATIVE 01/16/2012 1008   LEUKOCYTESUR NEGATIVE 01/16/2012 1008   Sepsis Labs: @LABRCNTIP (procalcitonin:4,lacticidven:4)  ) Recent Results (from the past 240 hour(s))  Resp Panel by RT-PCR (Flu A&B, Covid) Nasopharyngeal Swab     Status: None   Collection Time: 09/15/20  3:43 PM   Specimen: Nasopharyngeal Swab; Nasopharyngeal(NP) swabs in vial transport medium  Result Value Ref Range Status   SARS Coronavirus 2 by RT PCR NEGATIVE NEGATIVE Final    Comment: (NOTE) SARS-CoV-2 target nucleic acids are NOT DETECTED.  The SARS-CoV-2 RNA is generally detectable in upper respiratory specimens during the acute phase of infection. The lowest concentration of SARS-CoV-2 viral copies this assay can detect is 138 copies/mL. A negative result does not preclude SARS-Cov-2 infection and should not be used as the sole basis for treatment or other patient management decisions. A negative result may occur with  improper specimen collection/handling, submission of specimen other than nasopharyngeal swab, presence of viral mutation(s) within the areas targeted by this assay, and inadequate number of viral copies(<138 copies/mL). A negative result must be combined with clinical observations, patient history, and epidemiological information. The expected result is Negative.  Fact Sheet for Patients:  EntrepreneurPulse.com.au  Fact Sheet for  Healthcare Providers:  IncredibleEmployment.be  This test is no t yet approved or cleared by the [place] FDA and  has been authorized for detection and/or diagnosis of SARS-CoV-2 by FDA under an Emergency Use Authorization (EUA). This EUA will remain  in effect (meaning this test can be used) for the duration of the COVID-19 declaration under Section 564(b)(1) of the Act, 21 U.S.C.section 360bbb-3(b)(1), unless the authorization is terminated  or revoked sooner.       Influenza A by PCR NEGATIVE NEGATIVE Final   Influenza B by PCR NEGATIVE NEGATIVE Final    Comment: (NOTE) The Xpert Xpress SARS-CoV-2/FLU/RSV plus assay is intended as an aid in the diagnosis of influenza from Nasopharyngeal swab specimens and should not be used as a sole basis for treatment.  Nasal washings and aspirates are unacceptable for Xpert Xpress SARS-CoV-2/FLU/RSV testing.  Fact Sheet for Patients: EntrepreneurPulse.com.au  Fact Sheet for Healthcare Providers: IncredibleEmployment.be  This test is not yet approved or cleared by the [place] FDA and has been authorized for detection and/or diagnosis of SARS-CoV-2 by FDA under an Emergency Use Authorization (EUA). This EUA will remain in effect (meaning this test can be used) for the duration of the COVID-19 declaration under Section 564(b)(1) of the Act, 21 U.S.C. section 360bbb-3(b)(1), unless the authorization is terminated or revoked.  Performed at [facility], [place] [address], [place], [place] [address]   Blood culture (routine x 2)     Status: None (Preliminary result)   Collection Time: 09/15/20  3:43 PM   Specimen: BLOOD  Result Value Ref Range Status   Specimen Description   Final    BLOOD LEFT ANTECUBITAL Performed at [facility] [address] [place], [place] [address]    Special Requests   Final    BOTTLES DRAWN AEROBIC AND  ANAEROBIC Blood Culture adequate volume Performed at [facility] [address], [place], [place] [address]    Culture   Final    NO GROWTH < 12 HOURS Performed at [facility] [address], [place], [place] [address]    Report Status PENDING  Incomplete  Blood culture (routine x 2)     Status: None (Preliminary result)   Collection Time: 09/15/20  3:48 PM   Specimen: BLOOD  Result Value Ref Range Status   Specimen Description   Final    BLOOD RIGHT ANTECUBITAL Performed at [facility] [address], [place], [place] [address]    Special Requests   Final    BOTTLES DRAWN AEROBIC AND ANAEROBIC Blood Culture adequate volume Performed at [facility] [address], [place], [place] [address]    Culture   Final    NO GROWTH < 12 HOURS Performed at [facility] [address] [place],  [address]    Report Status PENDING  Incomplete         Radiology Studies: DG Chest 2 View  Result Date: 09/15/2020 CLINICAL DATA:  Shortness of breath. EXAM: CHEST - 2 VIEW COMPARISON:  January 16, 2012. FINDINGS: The heart size and mediastinal contours are within normal limits. No pneumothorax is noted. Large loculated right pleural effusion is noted with associated right basilar atelectasis or infiltrate. Minimal left basilar subsegmental atelectasis is noted with probable small left pleural effusion. The visualized skeletal structures are unremarkable. IMPRESSION: Large loculated right pleural effusion is noted with associated right basilar atelectasis or infiltrate. Electronically Signed   By: Marijo Conception M.D.   On: 09/15/2020 15:52   CT Chest W Contrast  Result Date: 09/15/2020 CLINICAL DATA:  Chest x-ray showing pleural effusion. EXAM: CT CHEST WITH CONTRAST TECHNIQUE: Multidetector CT imaging of the chest was performed during intravenous contrast administration. CONTRAST:  34mL OMNIPAQUE IOHEXOL 300 MG/ML   SOLN COMPARISON:  Chest x-ray from earlier same day. FINDINGS: Cardiovascular: Aortic atherosclerosis. No thoracic aortic aneurysm. No pericardial effusion. Scattered coronary artery calcifications. Mediastinum/Nodes: Numerous small lymph nodes within the mediastinum, majority with short axis measurements of less than 1 cm. Largest lymph node is seen within the upper LEFT paratracheal region with a short axis measurement of 1.3 cm (series 2, image 32). Esophagus is unremarkable.  Trachea is unremarkable. Lungs/Pleura: Loculated RIGHT pleural effusion, moderate to large in  size. Eccentric irregular pleural thickening at the RIGHT lateral lung base, at the periphery of the loculated pleural effusion (series 2, image 106), suspicious for neoplastic process. Additional masslike consolidations within the RIGHT lower lung, at the medial aspects of the loculated pleural effusion, most likely rib associated rounded atelectasis but some component may represent neoplastic mass. Irregular mass within the LEFT upper lobe, medial aspect, measuring 2 cm greatest dimension, almost certainly neoplastic. This abuts the LEFT mediastinal margin and there may be slight extension of this mass into the mediastinum. Additional layering LEFT pleural effusion, moderate in size. Upper Abdomen: Small low-density foci within the RIGHT liver lobe (series 2, images 150 and 151) w, suspicious for metastases. Splenomegaly. Musculoskeletal: No acute or suspicious osseous finding. IMPRESSION: 1. Loculated RIGHT pleural effusion, moderate to large in size. Eccentric irregular pleural thickening at the RIGHT lateral lung base, located at the periphery of this loculated pleural effusion, highly suspicious for neoplastic process at the pleural surface (primary or metastatic) with associated malignant effusion. 2. Additional larger masslike consolidations within the RIGHT lower lung, at the medial aspects of the loculated pleural effusion, most likely  rounded atelectasis but some component may represent neoplastic mass. 3. Highly suspicious irregular mass within the LEFT upper lobe, medial aspect, measuring 2 cm greatest dimension, almost certainly neoplastic. This abuts the LEFT mediastinal margin and may extend into the mediastinum 4. Additional layering LEFT pleural effusion, moderate in size, also suspicious for malignant effusion. 5. Small low-density foci within the RIGHT liver lobe, suspicious for hepatic metastases. 6. Splenomegaly. Aortic Atherosclerosis (ICD10-I70.0). Electronically Signed   By: Franki Cabot M.D.   On: 09/15/2020 18:32   DG Chest Port 1 View  Result Date: 09/16/2020 CLINICAL DATA:  Status post RIGHT thoracentesis for loculated RIGHT pleural effusion. EXAM: PORTABLE CHEST 1 VIEW COMPARISON:  09/15/2020 chest radiograph, CT and prior studies FINDINGS: Loculated RIGHT pleural effusion has decreased in size. A tiny amount of air within the loculated SUPERIOR RIGHT pleural space is noted. Nodularity of the LOWER RIGHT lung/pleura again noted. RIGHT hemithorax volume loss is again noted as well as LEFT pleural effusion. IMPRESSION: Decreased loculated RIGHT pleural effusion with tiny amount of air within the loculated SUPERIOR pleural space. No other significant changes. Electronically Signed   By: Margarette Canada M.D.   On: 09/16/2020 10:00        Scheduled Meds: . amLODipine  5 mg Oral Daily  . aspirin EC  81 mg Oral Daily  . fluticasone  2 spray Each Nare Daily  . furosemide  40 mg Oral Daily  . ipratropium  0.5 mg Nebulization TID  . levalbuterol  0.63 mg Nebulization TID  . simvastatin  20 mg Oral q1800  . tamsulosin  0.4 mg Oral Daily   Continuous Infusions:   LOS: 1 day    Time spent: 35 minutes    Dana Allan, MD  [facility] Pager #: [phone] 7PM-7AM contact night coverage as above

## 2020-09-16 NOTE — Progress Notes (Signed)
  Echocardiogram 2D Echocardiogram has been performed.  Randa Lynn Jeffree [place] 09/16/2020, 2:23 PM

## 2020-09-16 NOTE — Progress Notes (Signed)
Right sided thoracentesis completed by Elsworth Soho, MD. Rapid response nurse assisted and monitored pt and vital signs during procedure. Vital signs remained stable during procedure. Site dressed. Pt instructed to lay on R side to apply pressure. Pt verbalized and demonstrated understanding. Bedside RN instructed on post procedure monitoring and to call rapid response for any concerns. CXR ordered.

## 2020-09-16 NOTE — Progress Notes (Signed)
PT undergoing procedure at this time (Thorocentesis)- RT will attempt to revisit PT.

## 2020-09-16 NOTE — Plan of Care (Signed)

## 2020-09-17 DIAGNOSIS — I4891 Unspecified atrial fibrillation: Secondary | ICD-10-CM | POA: Diagnosis not present

## 2020-09-17 DIAGNOSIS — J90 Pleural effusion, not elsewhere classified: Secondary | ICD-10-CM | POA: Diagnosis not present

## 2020-09-17 DIAGNOSIS — R918 Other nonspecific abnormal finding of lung field: Secondary | ICD-10-CM | POA: Diagnosis not present

## 2020-09-17 DIAGNOSIS — Z9889 Other specified postprocedural states: Secondary | ICD-10-CM

## 2020-09-17 DIAGNOSIS — R0602 Shortness of breath: Secondary | ICD-10-CM | POA: Diagnosis not present

## 2020-09-17 DIAGNOSIS — J9601 Acute respiratory failure with hypoxia: Secondary | ICD-10-CM | POA: Diagnosis not present

## 2020-09-17 LAB — GLUCOSE, CAPILLARY
Glucose-Capillary: 113 mg/dL — ABNORMAL HIGH (ref 70–99)
Glucose-Capillary: 113 mg/dL — ABNORMAL HIGH (ref 70–99)
Glucose-Capillary: 129 mg/dL — ABNORMAL HIGH (ref 70–99)
Glucose-Capillary: 98 mg/dL (ref 70–99)
Glucose-Capillary: 121 mg/dL — ABNORMAL HIGH (ref 70–99)

## 2020-09-17 NOTE — Progress Notes (Signed)
PROGRESS NOTE    Adam Briggs  BPZ:[id number] DOB: 1929-08-15 DOA: 09/15/2020 PCP: [name], MD  Outpatient Specialists:   Brief Narrative:  As per H&P done by Tomasita Crumble: "Adam Briggs is a 87 y.o. male, with PMH of hypertension, dyslipidemia, aortic stenosis, likely undiagnosed COPD who presented to the ER on 09/15/2020 with dyspnea.  Patient states that over the past week or so he has had progressive worsening of dyspnea.  He has tried using his home albuterol inhaler with no significant improvement.  Additionally he noted lower extremity edema just over the past day or 2.  He says he has been urinating fine.  He denies any fevers or chills.  He occasionally has had episodes of wheezing over the past year or 2 but resolved with breathing treatments and antibiotics.  He denies having diagnosed COPD but is on as needed inhaler at home.  He also complained of some generalized weakness.  He also has a history of aortic stenosis but has not followed up on in a while due to [place].  Denies previous history of atrial fibrillation. Due to his symptoms he presented to his PCP.  He had a chest x-ray done that showed right lung severe edema, cardiomegaly.  Thus it is recommended him go to the ED for further evaluation.  ED course: -Vitals on admission: Heart rate 118, respiratory rate 18, blood pressure 146/77, O2 sat 96 percent on room air -Labs on initial presentation: Sodium 138, potassium 3.7, chloride 100, bicarb 30, glucose 128, BUN 26, creatinine 0.95, calcium 9.3, BNP 238, WBC 7.4, hemoglobin 12.5, platelets 230, COVID-negative -Imaging obtained on admission: X-ray demonstrated large loculated right pleural effusion.  CT chest with contrast demonstrated loculated right pleural effusion with irregular pleural thickening suspicious for neoplastic process, moderate left pleural effusion, and highly suspicious irregular mass in the left upper lobe -In the ED the patient was placed  on O2 supplementation.  Critical care/pulmonology was contacted for evaluation and recommended admitting to the hospitalist service with plan for thoracentesis tomorrow and will see after.  Thus the hospitalist service was contacted for further evaluation and management".  09/16/2020: Patient underwent paracentesis today and about 900 serosanguineous fluid was taken out.  Pleural fluid analysis in progress.  Patient breaths a lot better. 09/17/2020: Patient seen alongside patient's daughter.  Also discussed with the pulmonologist.  Still awaiting pleural fluid cytology etc.  Pulmonary team is directing care.   Assessment & Plan:   Active Problems:   Essential hypertension   Moderate aortic stenosis   SOB (shortness of breath)   Pleural effusion   Acute respiratory failure with hypoxia (HCC)   Mass of upper lobe of left lung   Atrial fibrillation, new onset ([facility])  1.  Acute hypoxic respiratory failure -Likely secondary to large right and moderate left pleural effusions and concerning lung lesions with underlying undiagnosed COPD - Has been requiring O2 supplementation in the ER to maintain saturation - CT demonstrated moderate to large located right pleural effusion and moderate pleural effusion on the left - O2 as tolerated - Pulmonology consulted - Plan for thoracentesis tomorrow - N.p.o. at midnight - Started on Lasix 40 mg PO daily - Strict I's and O's, daily weights - Scheduled and as needed breathing treatment 09/16/2020: Improved significantly following thoracentesis.  Pleural fluid analysis is in progress.  Pulmonary team is directing care. 09/17/2020: Respiratory status remains stable without oxygen.  Awaiting pleural fluid cytology.  2.  Large right loculated pleural effusion  and moderate left pleural effusion - CT demonstrated moderate to large located right pleural effusion and moderate pleural effusion on the left - Plan for thoracentesis tomorrow - See further plans  above 09/16/2020: Status post right-sided thoracentesis.  Follow pleural fluid analysis. 09/17/2020: Follow pleural fluid cytology.  Patient may need biopsy.  3.  Left upper lobe lung mass and right lung lesion - CT demonstrated irregular pleural thickening at the right lateral lung base and highly suspicious irregular mass within the left upper lobe - Pulmonology consulted - Suspect will need bronchoscopy - We will need oncology consult once diagnosis obtained - See further plans above 09/16/2020: Worrisome for malignancy.  Pulmonary team is directing care.  4.  New onset atrial fibrillation -Patient presents with new onset A. fib.  No previous diagnosis - We will start on metoprolol 5 mg IV Q6H PRN for HR >110 -We will hold off on anticoagulation at this time given planned thoracentesis and will tentatively plan to start anticoagulation afterwards - Telemetry  5.  Aortic stenosis - Previously diagnosed with aortic stenosis elevated but has not seen in it appears 7 years - Echocardiogram ordered, result is pending.  6.  Essential hypertension -Slightly above goal - Continue home Norvasc - We will hold home losartan/HCTZ given tachycardia, limiting AKI risk, and starting on Lasix as a diuretic as above - Continue close monitoring  7.  Dyslipidemia -Continue home statin  DVT prophylaxis: SCD. Code Status: Full code Family Communication: Daughter Disposition Plan: Will depend on hospital course   Consultants:   Pulmonary/critical care team  Procedures:   Right-sided thoracentesis  Antimicrobials:   None   Subjective: Shortness of breath has resolved.   No chest pain.   No fever or chills. No cough.  Objective: Vitals:   09/17/20 0617 09/17/20 0840 09/17/20 1342 09/17/20 1350  BP: 120/79   120/68  Pulse: 95   (!) 104  Resp: 16   20  Temp: 98.5 F (36.9 C)   98 F (36.7 C)  TempSrc: Oral   Oral  SpO2: 96% 97% 95% 98%  Weight:        Intake/Output  Summary (Last 24 hours) at 09/17/2020 1648 Last data filed at 09/17/2020 1054 Gross per 24 hour  Intake 360 ml  Output 900 ml  Net -540 ml   Filed Weights   09/16/20 0500 09/17/20 0500  Weight: 77.1 kg 72.4 kg    Examination:  General exam: Appears calm and comfortable  Respiratory system: Decreased air entry.   Cardiovascular system: S1 & S2, with ejection systolic murmur. No pedal edema. Gastrointestinal system: Abdomen is nondistended, soft and nontender. No organomegaly or masses felt. Normal bowel sounds heard. Central nervous system: Alert and oriented. No focal neurological deficits. Extremities: No leg edema  Data Reviewed: I have personally reviewed following labs and imaging studies  CBC: Recent Labs  Lab 09/15/20 1132 09/15/20 1550 09/15/20 2047 09/16/20 0359  WBC 5.7 7.4 5.9 5.2  NEUTROABS  --  5.8  --   --   HGB 12.2* 12.5* 12.1* 12.7*  HCT 37.5* 40.7 39.3 40.1  MCV 79.6 83.9 84.2 82.2  PLT 203.0 230 214 831   Basic Metabolic Panel: Recent Labs  Lab 09/15/20 1132 09/15/20 1550 09/16/20 0359  NA 139 138 138  K 3.9 3.7 3.8  CL 100 100 99  CO2 31 30 32  GLUCOSE 104* 128* 126*  BUN 26* 26* 21  CREATININE 0.98 0.95 0.90  CALCIUM 9.4 9.3 8.8*  MG  --   --  2.0  PHOS  --   --  3.7   GFR: Estimated Creatinine Clearance: 57 mL/min (by C-G formula based on SCr of 0.9 mg/dL). Liver Function Tests: Recent Labs  Lab 09/15/20 1132 09/15/20 1550 09/15/20 2047 09/16/20 0359  AST 11 15  --  16  ALT 12 15  --  13  ALKPHOS 59 62  --  55  BILITOT 0.6 0.5  --  0.5  PROT 6.7 7.1 6.6 6.1*  ALBUMIN 3.9 3.9 3.6 3.3*   No results for input(s): LIPASE, AMYLASE in the last 168 hours. No results for input(s): AMMONIA in the last 168 hours. Coagulation Profile: Recent Labs  Lab 09/15/20 2047  INR 1.1   Cardiac Enzymes: No results for input(s): CKTOTAL, CKMB, CKMBINDEX, TROPONINI in the last 168 hours. BNP (last 3 results) Recent Labs    09/15/20 1132   PROBNP 238.0*   HbA1C: Recent Labs    09/15/20 1132  HGBA1C 6.2   CBG: Recent Labs  Lab 09/16/20 1133 09/16/20 1647 09/17/20 0619 09/17/20 0759 09/17/20 1145  GLUCAP 90 152* 113* 113* 129*   Lipid Profile: No results for input(s): CHOL, HDL, LDLCALC, TRIG, CHOLHDL, LDLDIRECT in the last 72 hours. Thyroid Function Tests: No results for input(s): TSH, T4TOTAL, FREET4, T3FREE, THYROIDAB in the last 72 hours. Anemia Panel: No results for input(s): VITAMINB12, FOLATE, FERRITIN, TIBC, IRON, RETICCTPCT in the last 72 hours. Urine analysis:    Component Value Date/Time   COLORURINE YELLOW 01/16/2012 1008   APPEARANCEUR CLEAR 01/16/2012 1008   LABSPEC 1.013 01/16/2012 1008   PHURINE 7.0 01/16/2012 1008   GLUCOSEU NEGATIVE 01/16/2012 1008   GLUCOSEU NEGATIVE 09/23/2011 1032   HGBUR NEGATIVE 01/16/2012 1008   HGBUR negative 09/26/2008 0908   BILIRUBINUR NEGATIVE 01/16/2012 1008   KETONESUR NEGATIVE 01/16/2012 1008   PROTEINUR NEGATIVE 01/16/2012 1008   UROBILINOGEN 1.0 01/16/2012 1008   NITRITE NEGATIVE 01/16/2012 1008   LEUKOCYTESUR NEGATIVE 01/16/2012 1008   Sepsis Labs: @LABRCNTIP (procalcitonin:4,lacticidven:4)  ) Recent Results (from the past 240 hour(s))  Resp Panel by RT-PCR (Flu A&B, Covid) Nasopharyngeal Swab     Status: None   Collection Time: 09/15/20  3:43 PM   Specimen: Nasopharyngeal Swab; Nasopharyngeal(NP) swabs in vial transport medium  Result Value Ref Range Status   SARS Coronavirus 2 by RT PCR NEGATIVE NEGATIVE Final    Comment: (NOTE) SARS-CoV-2 target nucleic acids are NOT DETECTED.  The SARS-CoV-2 RNA is generally detectable in upper respiratory specimens during the acute phase of infection. The lowest concentration of SARS-CoV-2 viral copies this assay can detect is 138 copies/mL. A negative result does not preclude SARS-Cov-2 infection and should not be used as the sole basis for treatment or other patient management decisions. A negative  result may occur with  improper specimen collection/handling, submission of specimen other than nasopharyngeal swab, presence of viral mutation(s) within the areas targeted by this assay, and inadequate number of viral copies(<138 copies/mL). A negative result must be combined with clinical observations, patient history, and epidemiological information. The expected result is Negative.  Fact Sheet for Patients:  EntrepreneurPulse.com.au  Fact Sheet for Healthcare Providers:  IncredibleEmployment.be  This test is no t yet approved or cleared by the [place] FDA and  has been authorized for detection and/or diagnosis of SARS-CoV-2 by FDA under an Emergency Use Authorization (EUA). This EUA will remain  in effect (meaning this test can be used) for the duration of the COVID-19 declaration under Section 564(b)(1) of the Act, 21 U.S.C.section 360bbb-3(b)(1),  unless the authorization is terminated  or revoked sooner.       Influenza A by PCR NEGATIVE NEGATIVE Final   Influenza B by PCR NEGATIVE NEGATIVE Final    Comment: (NOTE) The Xpert Xpress SARS-CoV-2/FLU/RSV plus assay is intended as an aid in the diagnosis of influenza from Nasopharyngeal swab specimens and should not be used as a sole basis for treatment. Nasal washings and aspirates are unacceptable for Xpert Xpress SARS-CoV-2/FLU/RSV testing.  Fact Sheet for Patients: EntrepreneurPulse.com.au  Fact Sheet for Healthcare Providers: IncredibleEmployment.be  This test is not yet approved or cleared by the [place] FDA and has been authorized for detection and/or diagnosis of SARS-CoV-2 by FDA under an Emergency Use Authorization (EUA). This EUA will remain in effect (meaning this test can be used) for the duration of the COVID-19 declaration under Section 564(b)(1) of the Act, 21 U.S.C. section 360bbb-3(b)(1), unless the authorization is  terminated or revoked.  Performed at [facility], [place] [address], [place], [place] [address]   Blood culture (routine x 2)     Status: None (Preliminary result)   Collection Time: 09/15/20  3:43 PM   Specimen: BLOOD  Result Value Ref Range Status   Specimen Description   Final    BLOOD LEFT ANTECUBITAL Performed at [facility] [address], [place], [place] [address]    Special Requests   Final    BOTTLES DRAWN AEROBIC AND ANAEROBIC Blood Culture adequate volume Performed at [facility] [address], [place], [place] [address]    Culture   Final    NO GROWTH < 24 HOURS Performed at [facility] [address], [place], [place] [address]    Report Status PENDING  Incomplete  Blood culture (routine x 2)     Status: None (Preliminary result)   Collection Time: 09/15/20  3:48 PM   Specimen: BLOOD  Result Value Ref Range Status   Specimen Description   Final    BLOOD RIGHT ANTECUBITAL Performed at [facility] [address], [place], [place] [address]    Special Requests   Final    BOTTLES DRAWN AEROBIC AND ANAEROBIC Blood Culture adequate volume Performed at [facility] [address] [place], [place] [address]    Culture   Final    NO GROWTH < 24 HOURS Performed at [facility] [address], [place], [place] [address]    Report Status PENDING  Incomplete  Body fluid culture w Gram Stain     Status: None (Preliminary result)   Collection Time: 09/16/20  9:22 AM   Specimen: Pleura  Result Value Ref Range Status   Specimen Description   Final    PLEURAL RIGHT Performed at [facility] [address], [place], [place] [address]    Special Requests   Final    NONE Performed at [facility], [place] [address], [place], [place] [address]    Gram Stain   Final    FEW WBC PRESENT, PREDOMINANTLY MONONUCLEAR NO  ORGANISMS SEEN    Culture   Final    NO GROWTH < 12 HOURS Performed at [facility] [address] [place], [place] [address]    Report Status PENDING  Incomplete         Radiology Studies: CT Chest W Contrast  Result Date: 09/15/2020 CLINICAL DATA:  Chest x-ray showing pleural effusion. EXAM: CT CHEST WITH CONTRAST TECHNIQUE:  Multidetector CT imaging of the chest was performed during intravenous contrast administration. CONTRAST:  35mL OMNIPAQUE IOHEXOL 300 MG/ML  SOLN COMPARISON:  Chest x-ray from earlier same day. FINDINGS: Cardiovascular: Aortic atherosclerosis. No thoracic aortic aneurysm. No pericardial effusion. Scattered coronary artery calcifications. Mediastinum/Nodes: Numerous small lymph nodes within the mediastinum, majority with short axis measurements of less than 1 cm. Largest lymph node is seen within the upper LEFT paratracheal region with a short axis measurement of 1.3 cm (series 2, image 32). Esophagus is unremarkable.  Trachea is unremarkable. Lungs/Pleura: Loculated RIGHT pleural effusion, moderate to large in size. Eccentric irregular pleural thickening at the RIGHT lateral lung base, at the periphery of the loculated pleural effusion (series 2, image 106), suspicious for neoplastic process. Additional masslike consolidations within the RIGHT lower lung, at the medial aspects of the loculated pleural effusion, most likely rib associated rounded atelectasis but some component may represent neoplastic mass. Irregular mass within the LEFT upper lobe, medial aspect, measuring 2 cm greatest dimension, almost certainly neoplastic. This abuts the LEFT mediastinal margin and there may be slight extension of this mass into the mediastinum. Additional layering LEFT pleural effusion, moderate in size. Upper Abdomen: Small low-density foci within the RIGHT liver lobe (series 2, images 150 and 151) w, suspicious for metastases. Splenomegaly. Musculoskeletal: No acute or suspicious  osseous finding. IMPRESSION: 1. Loculated RIGHT pleural effusion, moderate to large in size. Eccentric irregular pleural thickening at the RIGHT lateral lung base, located at the periphery of this loculated pleural effusion, highly suspicious for neoplastic process at the pleural surface (primary or metastatic) with associated malignant effusion. 2. Additional larger masslike consolidations within the RIGHT lower lung, at the medial aspects of the loculated pleural effusion, most likely rounded atelectasis but some component may represent neoplastic mass. 3. Highly suspicious irregular mass within the LEFT upper lobe, medial aspect, measuring 2 cm greatest dimension, almost certainly neoplastic. This abuts the LEFT mediastinal margin and may extend into the mediastinum 4. Additional layering LEFT pleural effusion, moderate in size, also suspicious for malignant effusion. 5. Small low-density foci within the RIGHT liver lobe, suspicious for hepatic metastases. 6. Splenomegaly. Aortic Atherosclerosis (ICD10-I70.0). Electronically Signed   By: Franki Cabot M.D.   On: 09/15/2020 18:32   DG Chest Port 1 View  Result Date: 09/16/2020 CLINICAL DATA:  Status post RIGHT thoracentesis for loculated RIGHT pleural effusion. EXAM: PORTABLE CHEST 1 VIEW COMPARISON:  09/15/2020 chest radiograph, CT and prior studies FINDINGS: Loculated RIGHT pleural effusion has decreased in size. A tiny amount of air within the loculated SUPERIOR RIGHT pleural space is noted. Nodularity of the LOWER RIGHT lung/pleura again noted. RIGHT hemithorax volume loss is again noted as well as LEFT pleural effusion. IMPRESSION: Decreased loculated RIGHT pleural effusion with tiny amount of air within the loculated SUPERIOR pleural space. No other significant changes. Electronically Signed   By: Margarette Canada M.D.   On: 09/16/2020 10:00   ECHOCARDIOGRAM COMPLETE  Result Date: 09/16/2020    ECHOCARDIOGRAM REPORT   Patient Name:   RAJON BISIG Date of  Exam: 09/16/2020 Medical Rec #:  [id number]     Height:       70.0 in Accession #:    [id number]    Weight:       170.0 lb Date of Birth:  [birth year]     BSA:          1.948 m Patient Age:    70 years      BP:  122/82 mmHg Patient Gender: M             HR:           102 bpm. Exam Location:  Inpatient Procedure: 2D Echo, Cardiac Doppler and Color Doppler Indications:    I35.0 Nonrheumatic aortic (valve) stenosis  History:        Patient has prior history of Echocardiogram examinations, most                 recent 11/05/2012. Signs/Symptoms:Murmur; Risk                 Factors:Hypertension, Diabetes, Dyslipidemia and GERD. Cancer.  Sonographer:    Jonelle Sidle Dance Referring Phys: [id number] [place]  Sonographer Comments: Technically difficult study due to poor echo windows, no parasternal window, no subcostal window, suboptimal apical window and no apical window. IMPRESSIONS  1. LV not visualized.  2. RV not visualized.  3. Moderate pleural effusion in the right lateral region.  4. The aortic valve was not well visualized. Moderate aortic valve stenosis. Aortic valve mean gradient measures 26.0 mmHg. Aortic valve Vmax measures 3.47 m/s. FINDINGS  Left Ventricle: LV not visualized. Right Ventricle: RV not visualized. Aortic Valve: The aortic valve was not well visualized. Moderate aortic stenosis is present. Aortic valve mean gradient measures 26.0 mmHg. Aortic valve peak gradient measures 48.2 mmHg. Additional Comments: There is a moderate pleural effusion in the right lateral region.  AORTIC VALVE AV Vmax:      347.00 cm/s AV Vmean:     238.000 cm/s AV VTI:       0.603 m AV Peak Grad: 48.2 mmHg AV Mean Grad: 26.0 mmHg Candee Furbish MD Electronically signed by Candee Furbish MD Signature Date/Time: 09/16/2020/2:29:47 PM    Final         Scheduled Meds: . amLODipine  5 mg Oral Daily  . aspirin EC  81 mg Oral Daily  . fluticasone  2 spray Each Nare Daily  . furosemide  40 mg Oral Daily  . ipratropium   0.5 mg Nebulization TID  . levalbuterol  0.63 mg Nebulization TID  . simvastatin  20 mg Oral q1800  . tamsulosin  0.4 mg Oral Daily   Continuous Infusions:   LOS: 2 days    Time spent: 25 minutes    Dana Allan, MD  [facility] Pager #: [phone] 7PM-7AM contact night coverage as above

## 2020-09-17 NOTE — Plan of Care (Signed)

## 2020-09-17 NOTE — Progress Notes (Signed)
NAME:  Adam Briggs, MRN:  [id number], DOB:  June 25, 1929, LOS: 2 ADMISSION DATE:  09/15/2020, CONSULTATION DATE:  4/29 REFERRING MD:  Maralyn Sago, CHIEF COMPLAINT:  Pleural effusion and dyspnea   History of Present Illness:  87 year old male who presented to ED on 4/29 w/ about 1 month h/o progressive SOB & LE edema. Went to PCP who sent to ER for further eval.  In the ER cbc w/out leukocytosis, BNP 238, trop neg, Lactic acid 1.1, CT chest showed large loculated right effusion w/ R lateral pleural thickening, and RLL consolidation, and LUL lung mass w/ mediastinal involvement and small left effusion. There was also small low density foci w/in the right liver. Because of this PCCM was consulted.   Pertinent  Medical History  HTN, Hyperlipidemia, aortic stenosis, BPH, Diabetes, GERD, Prostate cancer s/p seed implant, inguinal hernia   Significant Hospital Events: Including procedures, antibiotic start and stop dates in addition to other pertinent events   . 4/29 Pulm consulted for pleural effusion and dyspnea   Interim History / Subjective:   Breathing is better. He is off oxygen Denies chest pain  Objective   Blood pressure 120/68, pulse (!) 104, temperature 98 F (36.7 C), temperature source Oral, resp. rate 20, weight 72.4 kg, SpO2 98 %.        Intake/Output Summary (Last 24 hours) at 09/17/2020 1438 Last data filed at 09/17/2020 1054 Gross per 24 hour  Intake 480 ml  Output 900 ml  Net -420 ml   Filed Weights   09/16/20 0500 09/17/20 0500  Weight: 77.1 kg 72.4 kg    Examination: Gen. Pleasant, well-nourished, in no distress, normal affect ENT - no pallor,icterus, hard of hearing Neck: No JVD, no thyromegaly, no carotid bruits Lungs: Decreased breath sounds on right, dullness to percussion on right, no use of accessory muscles , thoracentesis site, no bleeding Cardiovascular: Rhythm regular, heart sounds  normal, no murmurs or gallops, no peripheral edema Abdomen: soft and  non-tender, no hepatosplenomegaly, BS normal. Musculoskeletal: No deformities, no cyanosis or clubbing Neuro:  alert, non focal   Labs/imaging that I havepersonally reviewed  (right click and "Reselect all SmartList Selections" daily)   Chest x-ray shows slight decrease in size of large right loculated effusion Labs show LDH 122, protein 6.1, pleural fluid protein less than 3.0, LDH 134 Fluid culture negative  Resolved Hospital Problem list     Assessment & Plan:  large loculated right effusion, Right lateral pleural thickening vs mass. LUL mass, left sided adenopathy, mod sized left effusion w/ associated dyspnea  Hypodense liver lesions  -Concern here is for metastatic lung malignancy Suspect trapped lung since he had pain during thoracentesis  Plan  Await pleural fluid cytology, if negative for malignant cells , then options include biopsy of pleural-based mass versus liver biopsy.  May need PET scan to clarify target. Discussed this plan with patient, his daughter in the room    Labs   CBC: Recent Labs  Lab 09/15/20 1132 09/15/20 1550 09/15/20 2047 09/16/20 0359  WBC 5.7 7.4 5.9 5.2  NEUTROABS  --  5.8  --   --   HGB 12.2* 12.5* 12.1* 12.7*  HCT 37.5* 40.7 39.3 40.1  MCV 79.6 83.9 84.2 82.2  PLT 203.0 230 214 517    Basic Metabolic Panel: Recent Labs  Lab 09/15/20 1132 09/15/20 1550 09/16/20 0359  NA 139 138 138  K 3.9 3.7 3.8  CL 100 100 99  CO2 31 30 32  GLUCOSE 104* 128* 126*  BUN 26* 26* 21  CREATININE 0.98 0.95 0.90  CALCIUM 9.4 9.3 8.8*  MG  --   --  2.0  PHOS  --   --  3.7   GFR: Estimated Creatinine Clearance: 57 mL/min (by C-G formula based on SCr of 0.9 mg/dL). Recent Labs  Lab 09/15/20 1132 09/15/20 1550 09/15/20 2047 09/16/20 0359  WBC 5.7 7.4 5.9 5.2  LATICACIDVEN  --  1.1  --   --     Liver Function Tests: Recent Labs  Lab 09/15/20 1132 09/15/20 1550 09/15/20 2047 09/16/20 0359  AST 11 15  --  16  ALT 12 15  --  13   ALKPHOS 59 62  --  55  BILITOT 0.6 0.5  --  0.5  PROT 6.7 7.1 6.6 6.1*  ALBUMIN 3.9 3.9 3.6 3.3*   No results for input(s): LIPASE, AMYLASE in the last 168 hours. No results for input(s): AMMONIA in the last 168 hours.  ABG    Component Value Date/Time   HCO3 32.4 (H) 09/15/2020 1556   O2SAT 76.2 09/15/2020 [place] MD. FCCP. [facility] Pager : [phone]  If no response to pager , please call [phone] until 7 pm After 7:00 pm call Elink  [phone]   09/17/2020

## 2020-09-18 ENCOUNTER — Encounter (HOSPITAL_COMMUNITY): Payer: Self-pay | Admitting: Internal Medicine

## 2020-09-18 DIAGNOSIS — J9601 Acute respiratory failure with hypoxia: Secondary | ICD-10-CM | POA: Diagnosis not present

## 2020-09-18 DIAGNOSIS — J90 Pleural effusion, not elsewhere classified: Secondary | ICD-10-CM | POA: Diagnosis not present

## 2020-09-18 DIAGNOSIS — R918 Other nonspecific abnormal finding of lung field: Secondary | ICD-10-CM | POA: Diagnosis not present

## 2020-09-18 DIAGNOSIS — I4891 Unspecified atrial fibrillation: Secondary | ICD-10-CM | POA: Diagnosis not present

## 2020-09-18 DIAGNOSIS — I10 Essential (primary) hypertension: Secondary | ICD-10-CM | POA: Diagnosis not present

## 2020-09-18 LAB — BASIC METABOLIC PANEL
Anion gap: 8 (ref 5–15)
BUN: 31 mg/dL — ABNORMAL HIGH (ref 8–23)
CO2: 32 mmol/L (ref 22–32)
Calcium: 8.8 mg/dL — ABNORMAL LOW (ref 8.9–10.3)
Chloride: 96 mmol/L — ABNORMAL LOW (ref 98–111)
Creatinine, Ser: 1.11 mg/dL (ref 0.61–1.24)
GFR, Estimated: >60 mL/min (ref >60)
Glucose, Bld: 122 mg/dL — ABNORMAL HIGH (ref 70–99)
Potassium: 3.7 mmol/L (ref 3.5–5.1)
Sodium: 136 mmol/L (ref 135–145)

## 2020-09-18 LAB — GLUCOSE, CAPILLARY
Glucose-Capillary: 95 mg/dL (ref 70–99)
Glucose-Capillary: 113 mg/dL — ABNORMAL HIGH (ref 70–99)
Glucose-Capillary: 119 mg/dL — ABNORMAL HIGH (ref 70–99)
Glucose-Capillary: 105 mg/dL — ABNORMAL HIGH (ref 70–99)

## 2020-09-18 LAB — CBC WITH DIFFERENTIAL/PLATELET
Abs Immature Granulocytes: 0.01 10*3/uL (ref 0.00–0.07)
Basophils Absolute: 0 10*3/uL (ref 0.0–0.1)
Basophils Relative: 0 %
Eosinophils Absolute: 0.1 10*3/uL (ref 0.0–0.5)
Eosinophils Relative: 1 %
HCT: 39.2 % (ref 39.0–52.0)
Hemoglobin: 12 g/dL — ABNORMAL LOW (ref 13.0–17.0)
Immature Granulocytes: 0 %
Lymphocytes Relative: 9 %
Lymphs Abs: 0.4 10*3/uL — ABNORMAL LOW (ref 0.7–4.0)
MCH: 25.3 pg — ABNORMAL LOW (ref 26.0–34.0)
MCHC: 30.6 g/dL (ref 30.0–36.0)
MCV: 82.7 fL (ref 80.0–100.0)
Monocytes Absolute: 0.6 10*3/uL (ref 0.1–1.0)
Monocytes Relative: 12 %
Neutro Abs: 3.9 10*3/uL (ref 1.7–7.7)
Neutrophils Relative %: 78 %
Platelets: 215 10*3/uL (ref 150–400)
RBC: 4.74 MIL/uL (ref 4.22–5.81)
RDW: 15.9 % — ABNORMAL HIGH (ref 11.5–15.5)
WBC: 5 10*3/uL (ref 4.0–10.5)
nRBC: 0 % (ref 0.0–0.2)

## 2020-09-18 LAB — TSH: TSH: 3.642 u[IU]/mL (ref 0.350–4.500)

## 2020-09-18 MED ORDER — HEPARIN BOLUS VIA INFUSION
4000.0000 [IU] | Freq: Once | INTRAVENOUS | Status: AC
  Administered 2020-09-18: 4000 [IU] via INTRAVENOUS
  Filled 2020-09-18: qty 4000

## 2020-09-18 MED ORDER — HEPARIN (PORCINE) 25000 UT/250ML-% IV SOLN
1350.0000 [IU]/h | INTRAVENOUS | Status: DC
  Administered 2020-09-18: 1100 [IU]/h via INTRAVENOUS
  Administered 2020-09-19: 1250 [IU]/h via INTRAVENOUS
  Administered 2020-09-20: 1350 [IU]/h via INTRAVENOUS
  Administered 2020-09-20: 1400 [IU]/h via INTRAVENOUS
  Filled 2020-09-18 (×6): qty 250

## 2020-09-18 MED ORDER — LORAZEPAM 0.5 MG PO TABS: 0.5000 mg | ORAL_TABLET | ORAL | Status: DC | PRN

## 2020-09-18 MED ORDER — ENOXAPARIN SODIUM 40 MG/0.4ML IJ SOSY: 40.0000 mg | PREFILLED_SYRINGE | INTRAMUSCULAR | Status: DC

## 2020-09-18 MED ORDER — IPRATROPIUM-ALBUTEROL 0.5-2.5 (3) MG/3ML IN SOLN
3.0000 mL | Freq: Three times a day (TID) | RESPIRATORY_TRACT | Status: DC
  Administered 2020-09-18 – 2020-09-19 (×3): 3 mL via RESPIRATORY_TRACT
  Filled 2020-09-18 (×3): qty 3

## 2020-09-18 MED ORDER — METOPROLOL TARTRATE 25 MG PO TABS
12.5000 mg | ORAL_TABLET | Freq: Two times a day (BID) | ORAL | Status: DC
  Administered 2020-09-18: 12.5 mg via ORAL
  Filled 2020-09-18: qty 1

## 2020-09-18 NOTE — Plan of Care (Signed)
  Problem: Education: Goal: Knowledge of General Education information will improve Description Including pain rating scale, medication(s)/side effects and non-pharmacologic comfort measures Outcome: Progressing   Problem: Education: Goal: Knowledge of General Education information will improve Description Including pain rating scale, medication(s)/side effects and non-pharmacologic comfort measures Outcome: Progressing   

## 2020-09-18 NOTE — Consult Note (Addendum)
Cardiology Consultation:   Patient ID: Adam Briggs MRN: [id number]; DOB: 14-Apr-1930  Admit date: 09/15/2020 Date of Consult: 09/18/2020  PCP:  [name], MD   [place]  Cardiologist:  Previously seen by Dr. Percival Spanish in 2015 Electrophysiologist:  None    Patient Profile:   Adam Briggs is a 87 y.o. male with a history of moderate aortic stenosis, hypertension, hyperlipidemia, diabetes mellitus, GERD, and BPH who is being seen today for the evaluation of new onset atrial fibrillation and aortic stenosis at the request of Dr. Tawanna Solo.  History of Present Illness:   Adam Briggs is a 87 year old male with the above history. He was previously followed by Dr. Percival Spanish for aortic stenosis but has not been seen since 2015.  Patient states he has done well from a cardiac standpoint over the last several years.  Patient presented to the [facility] ED on 09/15/2020 for evaluation of shortness of breath and worsening lower extremity edema.  Patient reports progressive worsening shortness of breath with activity over the last 1 to 2 weeks.  He also describes bendopnea but denies any orthopnea or PND.  He notes lower extremity edema over the last few days prior to admission.  He denies any chest pain, palpitations, lightheadedness, dizziness, near syncope/syncope.  He has noted a little weight loss but nothing significant.  No recent fevers or illnesses.  No cough or nasal congestion.  He notes occasional constipation but no other GI symptoms.  No abnormal bleeding in urine or stools.  In the ED, patient tachycardic but otherwise vital stable. EKG showed new onset atrial fibrillation, rate 105 bpm, with non-specific ST/T changes. High-sensitivity troponin negative x2. BNP elevated at 238. Chest x-ray showed a large loculated right pleural effusion with associated basilar atelectasis or infiltrate. Chest CT confirmed loculated right pleural effusion with eccentric  irregular pleural thickening at the right lateral lung base located at the periphery of this loculated pleural surface with associated malignant effusion as well as larger masslike consolidate within the right lower lung and a highly suspicious irregular mass within the left upper lobe with additional layering left pleural effusion also suspicious for malignant effusion. There were small low-density foci within the right liver lobe suspicious for hepatic metastases. WBC 7.4, Hgb 12.5, Plts 230. Na 138, K 3.7, Glucose 128, BUN 26, Cr 0.95. LFTs normal. Venous blood gas showed pH 7.3, pCO2 55.5, pO2 44.9, Bicarb 32.4. Patient was admitted for further work-up. PCCM was consulted and performed ultrasound guided thoracentesis with removal of 900cc of sanguinous fluid. Pleural fluid was sent for chemistry, cell count, and cytology.  Echo was performed today but LV and RV were not able to be visualized. He was noted to have a moderate pleural effusion on the right as well as moderate aortic stenosis with mean gradient of 26.0 mmHg.  Cardiology consulted for further evaluation of atrial fibrillation and aortic stenosis.  At the time of this evaluation, patient resting comfortably no acute distress.  He reports he feels better after his thoracentesis and lower extremity edema has basically resolved.  He is still in atrial fibrillation at this time but is completely unaware of this.    Past Medical History:  Diagnosis Date  . BENIGN PROSTATIC HYPERTROPHY   . Cataract   . DIVERTICULOSIS, COLON   . DM   . GERD   . Heart murmur   . Hernia   . HYPERCHOLESTEROLEMIA   . HYPERTENSION   . Moderate  aortic stenosis 10/30/2011   echo 2013  . Prostate cancer [facility])    s/p seed implant    Past Surgical History:  Procedure Laterality Date  . CATARACT EXTRACTION W/ INTRAOCULAR LENS IMPLANT  02/2013   R, then L 4 weeks later - [place]  . INGUINAL HERNIA REPAIR     Right  . INGUINAL HERNIA REPAIR Left 07/15/2014    Procedure: LEFT INGUINAL HERNIA REPAIR WITH MESH;  Surgeon: Armandina Gemma, MD;  Location: [facility] ORS;  Service: General;  Laterality: Left;  . INSERTION OF MESH Left 07/15/2014   Procedure: INSERTION OF MESH;  Surgeon: Armandina Gemma, MD;  Location: [facility];  Service: General;  Laterality: Left;  . RADIOACTIVE SEED IMPLANT       Home Medications:  Prior to Admission medications   Medication Sig Start Date End Date Taking? Authorizing Provider  albuterol (VENTOLIN HFA) 108 (90 Base) MCG/ACT inhaler INHALE 1 TO 2 PUFFS BY MOUTH EVERY 6 HOURS AS NEEDED FOR WHEEZING FOR SHORTNESS OF BREATH Patient taking differently: Inhale 2 puffs into the lungs every 6 (six) hours as needed for wheezing or shortness of breath. 09/15/20  Yes [name], MD  amLODipine (NORVASC) 5 MG tablet Take 1 tablet (5 mg total) by mouth daily. 11/26/19  Yes [name], MD  aspirin EC 81 MG tablet Take 81 mg by mouth daily.   Yes [provider]  fluticasone (FLONASE) 50 MCG/ACT nasal spray Place 2 sprays into both nostrils daily. 08/04/18  Yes [name], MD  losartan-hydrochlorothiazide (HYZAAR) 100-25 MG tablet Take 1 tablet by mouth daily. 11/26/19  Yes [name], MD  Multiple Vitamin (MULTIVITAMIN WITH MINERALS) TABS tablet Take 1 tablet by mouth daily.   Yes [provider]  simvastatin (ZOCOR) 20 MG tablet Take 1 tablet (20 mg total) by mouth daily at 6 PM. 11/26/19  Yes [name], MD  tamsulosin (FLOMAX) 0.4 MG CAPS capsule Take 0.4 mg by mouth daily. 07/21/20  Yes [provider]    Inpatient Medications: Scheduled Meds: . aspirin EC  81 mg Oral Daily  . enoxaparin (LOVENOX) injection  40 mg Subcutaneous Q24H  . fluticasone  2 spray Each Nare Daily  . furosemide  40 mg Oral Daily  . ipratropium-albuterol  3 mL Nebulization TID  . metoprolol tartrate  12.5 mg Oral BID  . simvastatin  20 mg Oral q1800  . tamsulosin  0.4 mg Oral Daily   Continuous  Infusions:  PRN Meds: acetaminophen **OR** acetaminophen, alum & mag hydroxide-simeth, levalbuterol, metoprolol tartrate, ondansetron **OR** ondansetron (ZOFRAN) IV, oxyCODONE  Allergies:    Allergies  Allergen Reactions  . Lisinopril     REACTION: Rash    Social History:   Social History   Socioeconomic History  . Marital status: Widowed    Spouse name: Not on file  . Number of children: 2  . Years of education: Not on file  . Highest education level: Not on file  Occupational History  . Occupation: Retired     Comment: Retired  Immunologist  . Smoking status: Former Smoker    Packs/day: 0.50    Years: 35.00    Pack years: 17.50    Types: Cigarettes    Quit date: 10/30/1966    Years since quitting: 53.9  . Smokeless tobacco: Never Used  Substance and Sexual Activity  . Alcohol use: No  . Drug use: No  . Sexual activity: Not on file  Other Topics Concern  .  Not on file  Social History Narrative   Pt says his diet is excellent   Daily caffeine    Social Determinants of Health   Financial Resource Strain: Not on file  Food Insecurity: Not on file  Transportation Needs: Not on file  Physical Activity: Not on file  Stress: Not on file  Social Connections: Not on file  Intimate Partner Violence: Not on file    Family History:   Family History  Problem Relation Age of Onset  . Heart attack Paternal Uncle        in his 47s  . Atrial fibrillation Daughter   . Colon cancer Neg Hx      ROS:  Please see the history of present illness.  Review of Systems  Constitutional: Negative for fever.  HENT: Negative for congestion.   Respiratory: Positive for shortness of breath. Negative for cough and hemoptysis.   Cardiovascular: Positive for leg swelling. Negative for chest pain, palpitations, orthopnea and PND.  Gastrointestinal: Positive for constipation. Negative for abdominal pain, blood in stool, melena, nausea and vomiting.  Genitourinary: Negative for  hematuria.  Musculoskeletal: Negative for myalgias.  Neurological: Negative for dizziness and loss of consciousness.  Endo/Heme/Allergies: Does not bruise/bleed easily.  Psychiatric/Behavioral: Negative for substance abuse (remote tobacco use).    Physical Exam/Data:   Vitals:   09/18/20 0531 09/18/20 0853 09/18/20 1405 09/18/20 1521  BP: 112/78   (!) 115/51  Pulse: 94   98  Resp: 18   20  Temp: 97.7 F (36.5 C)   98.6 F (37 C)  TempSrc:    Oral  SpO2: 96% 94% 93% 94%  Weight:        Intake/Output Summary (Last 24 hours) at 09/18/2020 1524 Last data filed at 09/18/2020 0600 Gross per 24 hour  Intake 600 ml  Output 450 ml  Net 150 ml   Last 3 Weights 09/18/2020 09/17/2020 09/16/2020  Weight (lbs) 160 lb 11.5 oz 159 lb 9.8 oz 169 lb 15.6 oz  Weight (kg) 72.9 kg 72.4 kg 77.1 kg     Body mass index is 23.06 kg/m.  General: 87 y.o. male resting comfortably in no acute distress. HEENT: Normocephalic and atraumatic. Sclera clear.  Neck: Supple. Bilateral carotid bruits (possible radiation from aortic stenosis murmur). No JVD. Heart: Tachycardic with irregularly irregular rhythm. Distinct S1 and S2. III/VI systolic murmur heard best at the upper sternal border. No gallops or rubs. Radial pulses 2+ and equal bilaterally. Lungs: No increased work of breathing. Diminished breath sounds in bilateral bases (right > left). Abdomen: Soft, non-distended, and non-tender to palpation. Bowel sounds present. Extremities: No lower extremity edema.    Skin: Warm and dry. Neuro: Alert and oriented x3. No focal deficits. Psych: Normal affect. Responds appropriately.  EKG:  The EKG was personally reviewed and demonstrates: Atrial fibrillation, rate 105 bpm, with non-specific ST/T changes  Telemetry:  Telemetry was personally reviewed and demonstrates: Atrial fibrillation with rates in the 80's to 110's but as high as the 120's to 130's at times. PVCs and ventricular couplets also noted.  Relevant CV  Studies:  Echocardiogram 09/15/2020: Impressions: 1. LV not visualized.  2. RV not visualized.  3. Moderate pleural effusion in the right lateral region.  4. The aortic valve was not well visualized. Moderate aortic valve  stenosis. Aortic valve mean gradient measures 26.0 mmHg. Aortic valve Vmax  measures 3.47 m/s.  Laboratory Data:  High Sensitivity Troponin:   Recent Labs  Lab 09/15/20 1550 09/15/20 1740  TROPONINIHS 8 8     Chemistry Recent Labs  Lab 09/15/20 1550 09/16/20 0359 09/18/20 1032  NA 138 138 136  K 3.7 3.8 3.7  CL 100 99 96*  CO2 30 32 32  GLUCOSE 128* 126* 122*  BUN 26* 21 31*  CREATININE 0.95 0.90 1.11  CALCIUM 9.3 8.8* 8.8*  GFRNONAA >60 >60 >60  ANIONGAP 8 7 8     Recent Labs  Lab 09/15/20 1132 09/15/20 1550 09/15/20 2047 09/16/20 0359  PROT 6.7 7.1 6.6 6.1*  ALBUMIN 3.9 3.9 3.6 3.3*  AST 11 15  --  16  ALT 12 15  --  13  ALKPHOS 59 62  --  55  BILITOT 0.6 0.5  --  0.5   Hematology Recent Labs  Lab 09/15/20 2047 09/16/20 0359 09/18/20 1032  WBC 5.9 5.2 5.0  RBC 4.67 4.88 4.74  HGB 12.1* 12.7* 12.0*  HCT 39.3 40.1 39.2  MCV 84.2 82.2 82.7  MCH 25.9* 26.0 25.3*  MCHC 30.8 31.7 30.6  RDW 15.9* 15.9* 15.9*  PLT 214 190 215   BNP Recent Labs  Lab 09/15/20 1132 09/15/20 1550  BNP  --  238.0*  PROBNP 238.0*  --     DDimer No results for input(s): DDIMER in the last 168 hours.   Radiology/Studies:  DG Chest 2 View  Result Date: 09/15/2020 CLINICAL DATA:  Shortness of breath. EXAM: CHEST - 2 VIEW COMPARISON:  January 16, 2012. FINDINGS: The heart size and mediastinal contours are within normal limits. No pneumothorax is noted. Large loculated right pleural effusion is noted with associated right basilar atelectasis or infiltrate. Minimal left basilar subsegmental atelectasis is noted with probable small left pleural effusion. The visualized skeletal structures are unremarkable. IMPRESSION: Large loculated right pleural  effusion is noted with associated right basilar atelectasis or infiltrate. Electronically Signed   By: Marijo Conception M.D.   On: 09/15/2020 15:52   CT Chest W Contrast  Result Date: 09/15/2020 CLINICAL DATA:  Chest x-ray showing pleural effusion. EXAM: CT CHEST WITH CONTRAST TECHNIQUE: Multidetector CT imaging of the chest was performed during intravenous contrast administration. CONTRAST:  52mL OMNIPAQUE IOHEXOL 300 MG/ML  SOLN COMPARISON:  Chest x-ray from earlier same day. FINDINGS: Cardiovascular: Aortic atherosclerosis. No thoracic aortic aneurysm. No pericardial effusion. Scattered coronary artery calcifications. Mediastinum/Nodes: Numerous small lymph nodes within the mediastinum, majority with short axis measurements of less than 1 cm. Largest lymph node is seen within the upper LEFT paratracheal region with a short axis measurement of 1.3 cm (series 2, image 32). Esophagus is unremarkable.  Trachea is unremarkable. Lungs/Pleura: Loculated RIGHT pleural effusion, moderate to large in size. Eccentric irregular pleural thickening at the RIGHT lateral lung base, at the periphery of the loculated pleural effusion (series 2, image 106), suspicious for neoplastic process. Additional masslike consolidations within the RIGHT lower lung, at the medial aspects of the loculated pleural effusion, most likely rib associated rounded atelectasis but some component may represent neoplastic mass. Irregular mass within the LEFT upper lobe, medial aspect, measuring 2 cm greatest dimension, almost certainly neoplastic. This abuts the LEFT mediastinal margin and there may be slight extension of this mass into the mediastinum. Additional layering LEFT pleural effusion, moderate in size. Upper Abdomen: Small low-density foci within the RIGHT liver lobe (series 2, images 150 and 151) w, suspicious for metastases. Splenomegaly. Musculoskeletal: No acute or suspicious osseous finding. IMPRESSION: 1. Loculated RIGHT pleural  effusion, moderate to large in size. Eccentric irregular pleural thickening at the  RIGHT lateral lung base, located at the periphery of this loculated pleural effusion, highly suspicious for neoplastic process at the pleural surface (primary or metastatic) with associated malignant effusion. 2. Additional larger masslike consolidations within the RIGHT lower lung, at the medial aspects of the loculated pleural effusion, most likely rounded atelectasis but some component may represent neoplastic mass. 3. Highly suspicious irregular mass within the LEFT upper lobe, medial aspect, measuring 2 cm greatest dimension, almost certainly neoplastic. This abuts the LEFT mediastinal margin and may extend into the mediastinum 4. Additional layering LEFT pleural effusion, moderate in size, also suspicious for malignant effusion. 5. Small low-density foci within the RIGHT liver lobe, suspicious for hepatic metastases. 6. Splenomegaly. Aortic Atherosclerosis (ICD10-I70.0). Electronically Signed   By: Franki Cabot M.D.   On: 09/15/2020 18:32   DG Chest Port 1 View  Result Date: 09/16/2020 CLINICAL DATA:  Status post RIGHT thoracentesis for loculated RIGHT pleural effusion. EXAM: PORTABLE CHEST 1 VIEW COMPARISON:  09/15/2020 chest radiograph, CT and prior studies FINDINGS: Loculated RIGHT pleural effusion has decreased in size. A tiny amount of air within the loculated SUPERIOR RIGHT pleural space is noted. Nodularity of the LOWER RIGHT lung/pleura again noted. RIGHT hemithorax volume loss is again noted as well as LEFT pleural effusion. IMPRESSION: Decreased loculated RIGHT pleural effusion with tiny amount of air within the loculated SUPERIOR pleural space. No other significant changes. Electronically Signed   By: Margarette Canada M.D.   On: 09/16/2020 10:00   ECHOCARDIOGRAM COMPLETE  Result Date: 09/16/2020    ECHOCARDIOGRAM REPORT   Patient Name:   Adam Briggs Date of Exam: 09/16/2020 Medical Rec #:  [id number]     Height:        70.0 in Accession #:    [id number]    Weight:       170.0 lb Date of Birth:  1929-11-28     BSA:          1.948 m Patient Age:    65 years      BP:           122/82 mmHg Patient Gender: M             HR:           102 bpm. Exam Location:  Inpatient Procedure: 2D Echo, Cardiac Doppler and Color Doppler Indications:    I35.0 Nonrheumatic aortic (valve) stenosis  History:        Patient has prior history of Echocardiogram examinations, most                 recent 11/05/2012. Signs/Symptoms:Murmur; Risk                 Factors:Hypertension, Diabetes, Dyslipidemia and GERD. Cancer.  Sonographer:    Jonelle Sidle Dance Referring Phys: [id number] [place]  Sonographer Comments: Technically difficult study due to poor echo windows, no parasternal window, no subcostal window, suboptimal apical window and no apical window. IMPRESSIONS  1. LV not visualized.  2. RV not visualized.  3. Moderate pleural effusion in the right lateral region.  4. The aortic valve was not well visualized. Moderate aortic valve stenosis. Aortic valve mean gradient measures 26.0 mmHg. Aortic valve Vmax measures 3.47 m/s. FINDINGS  Left Ventricle: LV not visualized. Right Ventricle: RV not visualized. Aortic Valve: The aortic valve was not well visualized. Moderate aortic stenosis is present. Aortic valve mean gradient measures 26.0 mmHg. Aortic valve peak gradient measures 48.2 mmHg. Additional Comments: There is a moderate  pleural effusion in the right lateral region.  AORTIC VALVE AV Vmax:      347.00 cm/s AV Vmean:     238.000 cm/s AV VTI:       0.603 m AV Peak Grad: 48.2 mmHg AV Mean Grad: 26.0 mmHg Candee Furbish MD Electronically signed by Candee Furbish MD Signature Date/Time: 09/16/2020/2:29:47 PM    Final      Assessment and Plan:   New Onset Atrial Fibrillation - Patient presented with worsening dyspnea on exertion and bendopnea and was found to be in new onset atrial fibrillation.  - Rates currently in the 80's to 110s'.  - LV and RV  were unable to be visualized on Echo. Will repeat Echo. - Potassium 3.7 today. - Magnesium 2.0 on admission. - Will check TSH. - Start Lopressor 12.5mg  twice daily.  - CHA2DS2-VASc = 4 (HTN, DM, age x2). Patient is very functional at home and is stable on his feet. Would recommend starting DOAC when it is clear that no other procedures. Will start IV Heparin for now.  Aortic Stenosis  - Moderate AS noted on Echo this admission. Mean gradient 26.0 mmHg (it was 33mmHg on last Echo in 2014). - Can continue to monitor as outpatient. Although given advanced age and likely metastatic lung cancer, repeat imaging may not end up being necessary.  Bilateral Pleural Effision - Chest x-ray and CT showed loculated right pleural effusion. Overall concerning for malignancy. Please see full report above. - S/p right thoracentesis on 4/29 with removal of 900cc of fluid. Cytology pending. - Still has diminished breath sounds on the right but otherwise does not appear volume overloaded on exam. - Started on PO Lasix daily. - Management per PCCM.  Hypertension - BP well controlled. - Will start Lopressor 12.5mg  twice daily. - Will stop Amlodipine  so that rate control can be maximized.  Hyperlipidemia - Last lipid panel in our system is from 2020. LDL 71 at that time. - Continue home Simvastatin 20mg  daily. - Will recheck fasting lipid panel.  Otherwise, per primary team: - Left upper lobe lung mass/right lung lesion - Type 2 diabetes mellitus    Risk Assessment/Risk Scores:   CHA2DS2-VASc Score = 4  This indicates a 4.8% annual risk of stroke. The patient's score is based upon: CHF History: No HTN History: Yes Diabetes History: Yes Stroke History: No Vascular Disease History: No Age Score: 2 Gender Score: 0    For questions or updates, please contact [place] Please consult [email or web address] for contact info under    Signed, Darreld Mclean, PA-C  09/18/2020 3:24 PM

## 2020-09-18 NOTE — Progress Notes (Addendum)
PROGRESS NOTE    Adam Briggs  VOZ:[id number] DOB: [birth year] DOA: 09/15/2020 PCP: [name], MD   Chief Complain: Dyspnea  Brief Narrative: Patient is a 87 year old male with a past medical history of hypertension, dyslipidemia, aortic stenosis, who presents to the emergency department on 4/ 22 with complaints of dyspnea, generalized weakness.  He was seen at his cardiology office where he was found to have pleural effusion, cardiomegaly and he was recommended to go to the emergency department.  On presentation he was tachycardic but saturating fine on room air.  X-ray showed large loculated right pleural effusion.  CT chest confirmed loculated right pleural effusion with irregular pleural thickening suspicious for neoplastic process, moderate left pleural effusion.  PCCM consulted and he underwent thoracentesis.  Waiting for pleural cytology.  Plan for possible PET CT/biopsy.  Hospital course remarkable for persistent new onset A. fib with RVR  Assessment & Plan:   Active Problems:   Essential hypertension   Moderate aortic stenosis   SOB (shortness of breath)   Pleural effusion   Acute respiratory failure with hypoxia (HCC)   Mass of upper lobe of left lung   Atrial fibrillation, new onset (HCC)   Atrial fibrillation (HCC)   S/P thoracentesis   Acute hypoxic respiratory failure: Secondary to bilateral pleural effusion.  Currently on room air.  Continue supplemental oxygen as needed.  Bilateral pleural effusion: X-ray showed large loculated right pleural effusion.  CT chest confirmed loculated right pleural effusion with irregular pleural thickening suspicious for neoplastic process, moderate left pleural effusion.  PCCM consulted and he underwent thoracentesis.  Waiting for pleural cytology.   Started on Lasix daily.  Left upper lobe lung mass/right lung lesion:Plan for possible PET CT/biopsy through bronchoscopy.  PCCM following.  New onset A. fib: On presentation.   Still in A. fib, heart rate ranging in the range of 110s.  Monitor on telemetry.  We will request for cardiology evaluation for decision on starting anticoagulation on this elderly male with multiple problems. CHA2DS2VASc score of 3  Aortic stenosis: Diagnosed with aortic stenosis but has not followed up last 7 years.  Echo showed moderate aortic stenosis  Hypertension: Continue home Norvasc.  Home losartan/hydrochlorothiazide was held.  Currently blood pressure stable.  Hyperlipidemia: Continue statin         DVT prophylaxis:SCD Code Status: Full Family Communication: daughter at bedside Status is: Inpatient  Remains inpatient appropriate because:Inpatient level of care appropriate due to severity of illness   Dispo: The patient is from: Home              Anticipated d/c is to: Home              Patient currently is not medically stable to d/c.   Difficult to place patient No     Consultants: PCCM  Procedures:Thoracentesisdfgd  Antimicrobials:  Anti-infectives (From admission, onward)   None      Subjective:  Patient seen and examined at the bedside this morning.  Hemodynamically stable during my evaluation.  Comfortable.  Denies any chest pain or shortness of breath.  Eating his lunch.  EKG monitor showed A. fib rhythm  Objective: Vitals:   09/17/20 2139 09/18/20 0500 09/18/20 0531 09/18/20 0853  BP: (!) 108/58  112/78   Pulse: 78  94   Resp: 20  18   Temp: 97.7 F (36.5 C)  97.7 F (36.5 C)   TempSrc: Oral     SpO2: 96%  96% 94%  Weight:  72.9 kg      Intake/Output Summary (Last 24 hours) at 09/18/2020 1156 Last data filed at 09/18/2020 0600 Gross per 24 hour  Intake 600 ml  Output 450 ml  Net 150 ml   Filed Weights   09/16/20 0500 09/17/20 0500 09/18/20 0500  Weight: 77.1 kg 72.4 kg 72.9 kg    Examination:  General exam: Appears calm and comfortable ,Not in distress, pleasant elderly male HEENT:PERRL,Oral mucosa moist, Ear/Nose normal on gross  exam Respiratory system: Bilateral diminished air sounds, no wheezes or crackles Cardiovascular system: A. Fib, no JVD, murmurs, rubs, gallops or clicks. No pedal edema. Gastrointestinal system: Abdomen is nondistended, soft and nontender. No organomegaly or masses felt. Normal bowel sounds heard. Central nervous system: Alert and oriented. No focal neurological deficits. Extremities: No edema, no clubbing ,no cyanosis Skin: No rashes, lesions or ulcers,no icterus ,no pallor   Data Reviewed: I have personally reviewed following labs and imaging studies  CBC: Recent Labs  Lab 09/15/20 1132 09/15/20 1550 09/15/20 2047 09/16/20 0359 09/18/20 1032  WBC 5.7 7.4 5.9 5.2 5.0  NEUTROABS  --  5.8  --   --  3.9  HGB 12.2* 12.5* 12.1* 12.7* 12.0*  HCT 37.5* 40.7 39.3 40.1 39.2  MCV 79.6 83.9 84.2 82.2 82.7  PLT 203.0 230 214 190 315   Basic Metabolic Panel: Recent Labs  Lab 09/15/20 1132 09/15/20 1550 09/16/20 0359 09/18/20 1032  NA 139 138 138 136  K 3.9 3.7 3.8 3.7  CL 100 100 99 96*  CO2 31 30 32 32  GLUCOSE 104* 128* 126* 122*  BUN 26* 26* 21 31*  CREATININE 0.98 0.95 0.90 1.11  CALCIUM 9.4 9.3 8.8* 8.8*  MG  --   --  2.0  --   PHOS  --   --  3.7  --    GFR: Estimated Creatinine Clearance: 46.5 mL/min (by C-G formula based on SCr of 1.11 mg/dL). Liver Function Tests: Recent Labs  Lab 09/15/20 1132 09/15/20 1550 09/15/20 2047 09/16/20 0359  AST 11 15  --  16  ALT 12 15  --  13  ALKPHOS 59 62  --  55  BILITOT 0.6 0.5  --  0.5  PROT 6.7 7.1 6.6 6.1*  ALBUMIN 3.9 3.9 3.6 3.3*   No results for input(s): LIPASE, AMYLASE in the last 168 hours. No results for input(s): AMMONIA in the last 168 hours. Coagulation Profile: Recent Labs  Lab 09/15/20 2047  INR 1.1   Cardiac Enzymes: No results for input(s): CKTOTAL, CKMB, CKMBINDEX, TROPONINI in the last 168 hours. BNP (last 3 results) Recent Labs    09/15/20 1132  PROBNP 238.0*   HbA1C: No results for  input(s): HGBA1C in the last 72 hours. CBG: Recent Labs  Lab 09/17/20 0759 09/17/20 1145 09/17/20 1705 09/17/20 2140 09/18/20 0726  GLUCAP 113* 129* 98 121* 95   Lipid Profile: No results for input(s): CHOL, HDL, LDLCALC, TRIG, CHOLHDL, LDLDIRECT in the last 72 hours. Thyroid Function Tests: No results for input(s): TSH, T4TOTAL, FREET4, T3FREE, THYROIDAB in the last 72 hours. Anemia Panel: No results for input(s): VITAMINB12, FOLATE, FERRITIN, TIBC, IRON, RETICCTPCT in the last 72 hours. Sepsis Labs: Recent Labs  Lab 09/15/20 1550  LATICACIDVEN 1.1    Recent Results (from the past 240 hour(s))  Resp Panel by RT-PCR (Flu A&B, Covid) Nasopharyngeal Swab     Status: None   Collection Time: 09/15/20  3:43 PM   Specimen: Nasopharyngeal Swab; Nasopharyngeal(NP) swabs in vial  transport medium  Result Value Ref Range Status   SARS Coronavirus 2 by RT PCR NEGATIVE NEGATIVE Final    Comment: (NOTE) SARS-CoV-2 target nucleic acids are NOT DETECTED.  The SARS-CoV-2 RNA is generally detectable in upper respiratory specimens during the acute phase of infection. The lowest concentration of SARS-CoV-2 viral copies this assay can detect is 138 copies/mL. A negative result does not preclude SARS-Cov-2 infection and should not be used as the sole basis for treatment or other patient management decisions. A negative result may occur with  improper specimen collection/handling, submission of specimen other than nasopharyngeal swab, presence of viral mutation(s) within the areas targeted by this assay, and inadequate number of viral copies(<138 copies/mL). A negative result must be combined with clinical observations, patient history, and epidemiological information. The expected result is Negative.  Fact Sheet for Patients:  EntrepreneurPulse.com.au  Fact Sheet for Healthcare Providers:  IncredibleEmployment.be  This test is no t yet approved or  cleared by the [place] FDA and  has been authorized for detection and/or diagnosis of SARS-CoV-2 by FDA under an Emergency Use Authorization (EUA). This EUA will remain  in effect (meaning this test can be used) for the duration of the COVID-19 declaration under Section 564(b)(1) of the Act, 21 U.S.C.section 360bbb-3(b)(1), unless the authorization is terminated  or revoked sooner.       Influenza A by PCR NEGATIVE NEGATIVE Final   Influenza B by PCR NEGATIVE NEGATIVE Final    Comment: (NOTE) The Xpert Xpress SARS-CoV-2/FLU/RSV plus assay is intended as an aid in the diagnosis of influenza from Nasopharyngeal swab specimens and should not be used as a sole basis for treatment. Nasal washings and aspirates are unacceptable for Xpert Xpress SARS-CoV-2/FLU/RSV testing.  Fact Sheet for Patients: EntrepreneurPulse.com.au  Fact Sheet for Healthcare Providers: IncredibleEmployment.be  This test is not yet approved or cleared by the [place] FDA and has been authorized for detection and/or diagnosis of SARS-CoV-2 by FDA under an Emergency Use Authorization (EUA). This EUA will remain in effect (meaning this test can be used) for the duration of the COVID-19 declaration under Section 564(b)(1) of the Act, 21 U.S.C. section 360bbb-3(b)(1), unless the authorization is terminated or revoked.  Performed at [facility], [place] [address] [place], [place] [address]   Blood culture (routine x 2)     Status: None (Preliminary result)   Collection Time: 09/15/20  3:43 PM   Specimen: BLOOD  Result Value Ref Range Status   Specimen Description   Final    BLOOD LEFT ANTECUBITAL Performed at [facility] [address], [place], [place] [address]    Special Requests   Final    BOTTLES DRAWN AEROBIC AND ANAEROBIC Blood Culture adequate volume Performed at [facility] [address],  [place], [place] [address]    Culture   Final    NO GROWTH 3 DAYS Performed at [facility], [place] [address], [place], [place] [address]    Report Status PENDING  Incomplete  Blood culture (routine x 2)     Status: None (Preliminary result)   Collection Time: 09/15/20  3:48 PM   Specimen: BLOOD  Result Value Ref Range Status   Specimen Description   Final    BLOOD RIGHT ANTECUBITAL Performed at [facility] [address] [place], [place] [address]    Special Requests   Final    BOTTLES DRAWN AEROBIC AND ANAEROBIC Blood Culture adequate volume Performed at  [facility], [place] [address], [place], [place] [address]    Culture   Final    NO GROWTH 3 DAYS Performed at [facility], [place] [address] [place], [place] [address]    Report Status PENDING  Incomplete  Body fluid culture w Gram Stain     Status: None (Preliminary result)   Collection Time: 09/16/20  9:22 AM   Specimen: Pleura  Result Value Ref Range Status   Specimen Description   Final    PLEURAL RIGHT Performed at [facility] [address], [place], [place] [address]    Special Requests   Final    NONE Performed at [facility], [place] [address], [place], [place] [address]    Gram Stain   Final    FEW WBC PRESENT, PREDOMINANTLY MONONUCLEAR NO ORGANISMS SEEN    Culture   Final    NO GROWTH 2 DAYS Performed at [facility] [address] [place], [place] [address]    Report Status PENDING  Incomplete         Radiology Studies: ECHOCARDIOGRAM COMPLETE  Result Date: 09/16/2020    ECHOCARDIOGRAM REPORT   Patient Name:   Adam Briggs Date of Exam: 09/16/2020 Medical Rec #:  [id number]     Height:       70.0 in Accession #:    [id number]    Weight:       170.0 lb Date of Birth:  Mar 07, 1930     BSA:          1.948 m Patient Age:    56 years      BP:           122/82 mmHg Patient Gender: M             HR:           102 bpm.  Exam Location:  Inpatient Procedure: 2D Echo, Cardiac Doppler and Color Doppler Indications:    I35.0 Nonrheumatic aortic (valve) stenosis  History:        Patient has prior history of Echocardiogram examinations, most                 recent 11/05/2012. Signs/Symptoms:Murmur; Risk                 Factors:Hypertension, Diabetes, Dyslipidemia and GERD. Cancer.  Sonographer:    Jonelle Sidle Dance Referring Phys: [id number] [place]  Sonographer Comments: Technically difficult study due to poor echo windows, no parasternal window, no subcostal window, suboptimal apical window and no apical window. IMPRESSIONS  1. LV not visualized.  2. RV not visualized.  3. Moderate pleural effusion in the right lateral region.  4. The aortic valve was not well visualized. Moderate aortic valve stenosis. Aortic valve mean gradient measures 26.0 mmHg. Aortic valve Vmax measures 3.47 m/s. FINDINGS  Left Ventricle: LV not visualized. Right Ventricle: RV not visualized. Aortic Valve: The aortic valve was not well visualized. Moderate aortic stenosis is present. Aortic valve mean gradient measures 26.0 mmHg. Aortic valve peak gradient measures 48.2 mmHg. Additional Comments: There is a moderate pleural effusion in the right lateral region.  AORTIC VALVE AV Vmax:      347.00 cm/s AV Vmean:     238.000 cm/s AV VTI:       0.603 m AV Peak Grad: 48.2 mmHg AV Mean Grad: 26.0 mmHg Candee Furbish MD Electronically signed by Candee Furbish MD Signature Date/Time: 09/16/2020/2:29:47 PM  Final         Scheduled Meds: . amLODipine  5 mg Oral Daily  . aspirin EC  81 mg Oral Daily  . fluticasone  2 spray Each Nare Daily  . furosemide  40 mg Oral Daily  . ipratropium-albuterol  3 mL Nebulization TID  . simvastatin  20 mg Oral q1800  . tamsulosin  0.4 mg Oral Daily   Continuous Infusions:   LOS: 3 days    Time spent: 35 mins.More than 50% of that time was spent in counseling and/or coordination of care.      Shelly Coss, MD [facility] P5/06/2020, 11:56 AM

## 2020-09-18 NOTE — Progress Notes (Signed)
ANTICOAGULATION CONSULT NOTE - Initial Consult  Pharmacy Consult for Heparin  Indication: atrial fibrillation  Allergies  Allergen Reactions  . Lisinopril     REACTION: Rash    Patient Measurements: Weight: 72.9 kg (160 lb 11.5 oz) Heparin Dosing Weight:   Vital Signs: Temp: 98.6 F (37 C) (05/02 1521) Temp Source: Oral (05/02 1521) BP: 115/51 (05/02 1521) Pulse Rate: 98 (05/02 1521)  Labs: Recent Labs    09/15/20 1550 09/15/20 1740 09/15/20 2047 09/16/20 0359 09/18/20 1032  HGB 12.5*  --  12.1* 12.7* 12.0*  HCT 40.7  --  39.3 40.1 39.2  PLT 230  --  214 190 215  LABPROT  --   --  13.9  --   --   INR  --   --  1.1  --   --   CREATININE 0.95  --   --  0.90 1.11  TROPONINIHS 8 8  --   --   --     Estimated Creatinine Clearance: 46.5 mL/min (by C-G formula based on SCr of 1.11 mg/dL).   Medical History: Past Medical History:  Diagnosis Date  . BENIGN PROSTATIC HYPERTROPHY   . Cataract   . DIVERTICULOSIS, COLON   . DM   . GERD   . Heart murmur   . Hernia   . HYPERCHOLESTEROLEMIA   . HYPERTENSION   . Moderate aortic stenosis 10/30/2011   echo 2013  . Prostate cancer ([facility])    s/p seed implant    Medications:  Scheduled:  . aspirin EC  81 mg Oral Daily  . enoxaparin (LOVENOX) injection  40 mg Subcutaneous Q24H  . fluticasone  2 spray Each Nare Daily  . furosemide  40 mg Oral Daily  . ipratropium-albuterol  3 mL Nebulization TID  . metoprolol tartrate  12.5 mg Oral BID  . simvastatin  20 mg Oral q1800  . tamsulosin  0.4 mg Oral Daily   Infusions:    Assessment: 88 yoM presents to ED with SOB and LE edema.  Found to have new onset atrial fibrillation and aortic stenosis.  Pharmacy is consulted on 5/2 to dose heparin for Afib. No PTA anticoagulation reported.  Baseline coags (4/29) INR 1.1 S/p thoracentesis on 4/30 - cytology pending.  Follow up plans if further procedures or biopsy will be needed. CBC:  Hgb down to 12, Plt WNL  Goal of Therapy:   Heparin level 0.3-0.7 units/ml Monitor platelets by anticoagulation protocol: Yes   Plan:  Give heparin 3500 units bolus IV x 1 Start heparin IV infusion at 1100 units/hr Heparin level 8 hours after starting Daily heparin level and CBC Monitor for s/s thrombosis, bleeding, and for further procedural plans. Cardiology recommends a DOAC if no further procedures are needed.     Gretta Arab PharmD, BCPS Clinical Pharmacist [facility] main pharmacy [phone] 09/18/2020 3:42 PM

## 2020-09-18 NOTE — Plan of Care (Signed)
  Problem: Coping: Goal: Level of anxiety will decrease Outcome: Completed/Met

## 2020-09-18 NOTE — Progress Notes (Signed)
NAME:  Adam Briggs, MRN:  [id number], DOB:  1929/08/06, LOS: 3 ADMISSION DATE:  09/15/2020, CONSULTATION DATE:  4/29 REFERRING MD:  Maralyn Sago, CHIEF COMPLAINT:  Pleural effusion and dyspnea   History of Present Illness:  87 year old male who presented to ED on 4/29 w/ about 1 month h/o progressive SOB & LE edema. Went to PCP who sent to ER for further eval.  In the ER cbc w/out leukocytosis, BNP 238, trop neg, Lactic acid 1.1, CT chest showed large loculated right effusion w/ R lateral pleural thickening, and RLL consolidation, and LUL lung mass w/ mediastinal involvement and small left effusion. There was also small low density foci w/in the right liver. Because of this PCCM was consulted.   Pertinent  Medical History  HTN, Hyperlipidemia, aortic stenosis, BPH, Diabetes, GERD, Prostate cancer s/p seed implant, inguinal hernia   Significant Hospital Events: Including procedures, antibiotic start and stop dates in addition to other pertinent events   . 4/29 Pulm consulted for pleural effusion and dyspnea   Interim History / Subjective:   Breathing improving On room air  Objective   Blood pressure (!) 115/51, pulse 98, temperature 98.6 F (37 C), temperature source Oral, resp. rate 20, weight 72.9 kg, SpO2 94 %.        Intake/Output Summary (Last 24 hours) at 09/18/2020 1623 Last data filed at 09/18/2020 0600 Gross per 24 hour  Intake 600 ml  Output 450 ml  Net 150 ml   Filed Weights   09/16/20 0500 09/17/20 0500 09/18/20 0500  Weight: 77.1 kg 72.4 kg 72.9 kg    Examination: Gen. elderly, does appear comfortable ENT -moist oral mucosa, hard of hearing Neck: No JVD, no thyromegaly Lungs: Decreased breath sounds bilaterally Cardiovascular: S1-S2 appreciated  Labs/imaging that I havepersonally reviewed  (right click and "Reselect all SmartList Selections" daily)   Chest x-ray reviewed with patient at bedside  Resolved Hospital Problem list     Assessment & Plan:   large loculated right effusion, Right lateral pleural thickening vs mass. LUL mass, left sided adenopathy, mod sized left effusion w/ associated dyspnea  Hypodense liver lesions  -Concern of trapped lung especially with pain and discomfort during thoracentesis -Patient states is at the tail end of thoracentesis  Plan Pleural cytology still pending at present Discussed with patient and daughter at bedside  May need further investigative if cytology negative    Labs   CBC: Recent Labs  Lab 09/15/20 1132 09/15/20 1550 09/15/20 2047 09/16/20 0359 09/18/20 1032  WBC 5.7 7.4 5.9 5.2 5.0  NEUTROABS  --  5.8  --   --  3.9  HGB 12.2* 12.5* 12.1* 12.7* 12.0*  HCT 37.5* 40.7 39.3 40.1 39.2  MCV 79.6 83.9 84.2 82.2 82.7  PLT 203.0 230 214 190 824    Basic Metabolic Panel: Recent Labs  Lab 09/15/20 1132 09/15/20 1550 09/16/20 0359 09/18/20 1032  NA 139 138 138 136  K 3.9 3.7 3.8 3.7  CL 100 100 99 96*  CO2 31 30 32 32  GLUCOSE 104* 128* 126* 122*  BUN 26* 26* 21 31*  CREATININE 0.98 0.95 0.90 1.11  CALCIUM 9.4 9.3 8.8* 8.8*  MG  --   --  2.0  --   PHOS  --   --  3.7  --    GFR: Estimated Creatinine Clearance: 46.5 mL/min (by C-G formula based on SCr of 1.11 mg/dL). Recent Labs  Lab 09/15/20 1550 09/15/20 2047 09/16/20 0359 09/18/20 1032  WBC 7.4 5.9 5.2 5.0  LATICACIDVEN 1.1  --   --   --     Liver Function Tests: Recent Labs  Lab 09/15/20 1132 09/15/20 1550 09/15/20 2047 09/16/20 0359  AST 11 15  --  16  ALT 12 15  --  13  ALKPHOS 59 62  --  55  BILITOT 0.6 0.5  --  0.5  PROT 6.7 7.1 6.6 6.1*  ALBUMIN 3.9 3.9 3.6 3.3*   No results for input(s): LIPASE, AMYLASE in the last 168 hours. No results for input(s): AMMONIA in the last 168 hours.  ABG    Component Value Date/Time   HCO3 32.4 (H) 09/15/2020 1556   O2SAT 76.2 09/15/2020 1556     Marcela Alatorre Ander Slade, MD [facility] Pager: [phone]

## 2020-09-19 ENCOUNTER — Other Ambulatory Visit (HOSPITAL_COMMUNITY): Payer: Medicare Other

## 2020-09-19 DIAGNOSIS — I350 Nonrheumatic aortic (valve) stenosis: Secondary | ICD-10-CM | POA: Diagnosis not present

## 2020-09-19 DIAGNOSIS — I10 Essential (primary) hypertension: Secondary | ICD-10-CM | POA: Diagnosis not present

## 2020-09-19 DIAGNOSIS — R0602 Shortness of breath: Secondary | ICD-10-CM | POA: Diagnosis not present

## 2020-09-19 DIAGNOSIS — I4891 Unspecified atrial fibrillation: Secondary | ICD-10-CM | POA: Diagnosis not present

## 2020-09-19 LAB — CYTOLOGY - NON PAP

## 2020-09-19 LAB — TRIGLYCERIDES, BODY FLUIDS: Triglycerides, Fluid: 12 mg/dL

## 2020-09-19 LAB — GLUCOSE, CAPILLARY
Glucose-Capillary: 100 mg/dL — ABNORMAL HIGH (ref 70–99)
Glucose-Capillary: 138 mg/dL — ABNORMAL HIGH (ref 70–99)
Glucose-Capillary: 113 mg/dL — ABNORMAL HIGH (ref 70–99)
Glucose-Capillary: 97 mg/dL (ref 70–99)

## 2020-09-19 LAB — LIPID PANEL
Cholesterol: 117 mg/dL (ref 0–200)
HDL: 36 mg/dL — ABNORMAL LOW (ref >40)
LDL Cholesterol: 72 mg/dL (ref 0–99)
Total CHOL/HDL Ratio: 3.3 RATIO
Triglycerides: 43 mg/dL (ref <150)
VLDL: 9 mg/dL (ref 0–40)

## 2020-09-19 LAB — HEPARIN LEVEL (UNFRACTIONATED)
Heparin Unfractionated: 0.21 IU/mL — ABNORMAL LOW (ref 0.30–0.70)
Heparin Unfractionated: 0.26 IU/mL — ABNORMAL LOW (ref 0.30–0.70)
Heparin Unfractionated: 0.5 IU/mL (ref 0.30–0.70)

## 2020-09-19 MED ORDER — IPRATROPIUM-ALBUTEROL 0.5-2.5 (3) MG/3ML IN SOLN
3.0000 mL | Freq: Two times a day (BID) | RESPIRATORY_TRACT | Status: DC
  Administered 2020-09-19 – 2020-09-25 (×12): 3 mL via RESPIRATORY_TRACT
  Filled 2020-09-19 (×12): qty 3

## 2020-09-19 MED ORDER — METOPROLOL TARTRATE 25 MG PO TABS
25.0000 mg | ORAL_TABLET | Freq: Two times a day (BID) | ORAL | Status: DC
  Administered 2020-09-19 – 2020-09-28 (×19): 25 mg via ORAL
  Filled 2020-09-19 (×19): qty 1

## 2020-09-19 MED ORDER — HEPARIN BOLUS VIA INFUSION
1000.0000 [IU] | Freq: Once | INTRAVENOUS | Status: AC
  Administered 2020-09-19: 1000 [IU] via INTRAVENOUS
  Filled 2020-09-19: qty 1000

## 2020-09-19 NOTE — Progress Notes (Addendum)
Progress Note  Patient Name: Adam Briggs Date of Encounter: 09/19/2020  [facility] Cardiologist: Previously seen by Dr. Percival Spanish in 2015  Subjective   No acute overnight events. Feels well this morning. No shortness of breath, chest pain, or palpitations. Lower extremity edema improved.  Inpatient Medications    Scheduled Meds: . aspirin EC  81 mg Oral Daily  . fluticasone  2 spray Each Nare Daily  . furosemide  40 mg Oral Daily  . ipratropium-albuterol  3 mL Nebulization TID  . metoprolol tartrate  12.5 mg Oral BID  . simvastatin  20 mg Oral q1800  . tamsulosin  0.4 mg Oral Daily   Continuous Infusions: . heparin 1,250 Units/hr (09/19/20 0359)   PRN Meds: acetaminophen **OR** acetaminophen, alum & mag hydroxide-simeth, levalbuterol, metoprolol tartrate, ondansetron **OR** ondansetron (ZOFRAN) IV, oxyCODONE   Vital Signs    Vitals:   09/18/20 2027 09/18/20 2204 09/19/20 0430 09/19/20 0500  BP:  113/71 111/77   Pulse:  93 70   Resp:  20 16   Temp:  98.4 F (36.9 C) 97.7 F (36.5 C)   TempSrc:  Oral Oral   SpO2: 94% 94% 96%   Weight:    73.3 kg    Intake/Output Summary (Last 24 hours) at 09/19/2020 6606 Last data filed at 09/19/2020 0430 Gross per 24 hour  Intake 636.09 ml  Output 700 ml  Net -63.91 ml   Last 3 Weights 09/19/2020 09/18/2020 09/17/2020  Weight (lbs) 161 lb 9.6 oz 160 lb 11.5 oz 159 lb 9.8 oz  Weight (kg) 73.3 kg 72.9 kg 72.4 kg      Telemetry    Atrial fibrillation with rates in the 70's to 80's. - Personally Reviewed  ECG    No new ECG tracing today. - Personally Reviewed  Physical Exam   GEN: No acute distress.   Neck: No JVD. Cardiac: Irregularly irregular rhythm with normal rate. III/VI systolic murmur consistent with aortic stenosis. No rubs or gallops.  Respiratory: No increased work of breathing. Mildly decreased breath sounds in bilateral bases (right > left). But improved breath sounds from yesterday. GI: Soft, non-distended,  and non-tender.  MS: No lower extremity edema. No deformity. Skin: Warm and dry. Neuro:  No focal deficits.  Psych: Normal affect. Responds appropriately.  Labs    High Sensitivity Troponin:   Recent Labs  Lab 09/15/20 1550 09/15/20 1740  TROPONINIHS 8 8      Chemistry Recent Labs  Lab 09/15/20 1132 09/15/20 1550 09/15/20 2047 09/16/20 0359 09/18/20 1032  NA 139 138  --  138 136  K 3.9 3.7  --  3.8 3.7  CL 100 100  --  99 96*  CO2 31 30  --  32 32  GLUCOSE 104* 128*  --  126* 122*  BUN 26* 26*  --  21 31*  CREATININE 0.98 0.95  --  0.90 1.11  CALCIUM 9.4 9.3  --  8.8* 8.8*  PROT 6.7 7.1 6.6 6.1*  --   ALBUMIN 3.9 3.9 3.6 3.3*  --   AST 11 15  --  16  --   ALT 12 15  --  13  --   ALKPHOS 59 62  --  55  --   BILITOT 0.6 0.5  --  0.5  --   GFRNONAA  --  >60  --  >60 >60  ANIONGAP  --  8  --  7 8     Hematology Recent Labs  Lab 09/15/20 2047 09/16/20 0359 09/18/20 1032  WBC 5.9 5.2 5.0  RBC 4.67 4.88 4.74  HGB 12.1* 12.7* 12.0*  HCT 39.3 40.1 39.2  MCV 84.2 82.2 82.7  MCH 25.9* 26.0 25.3*  MCHC 30.8 31.7 30.6  RDW 15.9* 15.9* 15.9*  PLT 214 190 215    BNP Recent Labs  Lab 09/15/20 1132 09/15/20 1550  BNP  --  238.0*  PROBNP 238.0*  --      DDimer No results for input(s): DDIMER in the last 168 hours.   Radiology    No results found.  Cardiac Studies   Echocardiogram 09/15/2020: Impressions: 1. LV not visualized.  2. RV not visualized.  3. Moderate pleural effusion in the right lateral region.  4. The aortic valve was not well visualized. Moderate aortic valve  stenosis. Aortic valve mean gradient measures 26.0 mmHg. Aortic valve Vmax  measures 3.47 m/s.  Patient Profile     87 y.o. male  with a history of moderate aortic stenosis, hypertension, hyperlipidemia, diabetes mellitus, GERD, and BPH who is being seen today for the evaluation of new onset atrial fibrillation and aortic stenosis at the request of Dr. Tawanna Solo.  Assessment  & Plan    New Onset Atrial Fibrillation - Patient presented with worsening dyspnea on exertion and bendopnea and was found to be in new onset atrial fibrillation.  - Rates improved with addition of beta-blocker. Currently in the 70's to 80's. - LV and RV were unable to be visualized on Echo. Will repeat Echo. - Potassium 3.7 today. - Magnesium 2.0 on admission. - TSH normal. - Continue Lopressor 12.5mg  twice daily.  - CHA2DS2-VASc = 4 (HTN, DM, age x2). Currently on IV Heparin. Would recommend starting DOAC when it is clear that no other procedures. Will go ahead and stop Aspirin.  Aortic Stenosis  - Moderate AS noted on Echo this admission. Mean gradient 26.0 mmHg (it was 8mmHg on last Echo in 2014). - Can continue to monitor as outpatient.  Bilateral Pleural Effision - Chest x-ray and CT showed loculated right pleural effusion. Overall concerning for malignancy. Please see full report above. - S/p right thoracentesis on 4/29 with removal of 900cc of sanguinous fluid. Cytology pending. - Net negative 1.9 L this admission. Does not appear volume overloaded on exam. - Started on PO Lasix 40mg  daily yesterday. Continue. - Management per PCCM.  Carotid Bruits - Bilateral bruits noted on exam yesterday. Suspect radiation from aortic stenosis murmur.  - Can consider carotid dopplers as outpatient.  Hypertension - BP well controlled. - Will start Lopressor 12.5mg  twice daily. - Will stop Amlodipine  so that rate control can be maximized.  Hyperlipidemia - Lipid panel this admission: Total Cholesterol 117, Triglycerides 43, HDL 36, LDL 72. - Continue home Simvastatin 20mg  daily.  Otherwise, per primary team: - Left upper lobe lung mass/right lung lesion - Type 2 diabetes mellitus   For questions or updates, please contact [facility] Please consult [email or web address] for contact info under        Signed, Darreld Mclean, PA-C  09/19/2020, 7:22 AM

## 2020-09-19 NOTE — Care Management Important Message (Signed)
Important Message  Patient Details IM Letter given to the Patient Name: Adam Briggs MRN: [id number] Date of Birth: [birth year]   Medicare Important Message Given:  Yes     Kerin Salen 09/19/2020, 9:25 AM

## 2020-09-19 NOTE — Progress Notes (Signed)
ANTICOAGULATION CONSULT NOTE - follow up  Pharmacy Consult for Heparin  Indication: atrial fibrillation  Allergies  Allergen Reactions  . Lisinopril     REACTION: Rash    Patient Measurements: Weight: 73.3 kg (161 lb 9.6 oz) Heparin Dosing Weight: 73.3 kg  Vital Signs: Temp: 98.5 F (36.9 C) (05/03 1143) Temp Source: Oral (05/03 1143) BP: 114/57 (05/03 1143) Pulse Rate: 63 (05/03 1143)  Labs: Recent Labs    09/18/20 1032 09/19/20 0031 09/19/20 1127  HGB 12.0*  --   --   HCT 39.2  --   --   PLT 215  --   --   HEPARINUNFRC  --  0.26* 0.21*  CREATININE 1.11  --   --     Estimated Creatinine Clearance: 46.6 mL/min (by C-G formula based on SCr of 1.11 mg/dL).   Medical History: Past Medical History:  Diagnosis Date  . BENIGN PROSTATIC HYPERTROPHY   . Cataract   . DIVERTICULOSIS, COLON   . DM   . GERD   . Heart murmur   . Hernia   . HYPERCHOLESTEROLEMIA   . HYPERTENSION   . Moderate aortic stenosis 10/30/2011   echo 2013  . Prostate cancer ([place])    s/p seed implant    Medications:  Scheduled:  . fluticasone  2 spray Each Nare Daily  . furosemide  40 mg Oral Daily  . ipratropium-albuterol  3 mL Nebulization BID  . metoprolol tartrate  25 mg Oral BID  . simvastatin  20 mg Oral q1800  . tamsulosin  0.4 mg Oral Daily   Infusions:  . heparin 1,250 Units/hr (09/19/20 1005)    Assessment: 77 yoM presents to ED with SOB and LE edema.  Found to have new onset atrial fibrillation and aortic stenosis.  Pharmacy is consulted on 5/2 to dose heparin for Afib. No PTA anticoagulation reported.  Baseline coags (4/29) INR 1.1 S/p thoracentesis on 4/30 - cytology pending.  Follow up plans if further procedures or biopsy will be needed. CBC:  Hgb down to 12, Plt WNL  HL 0.26 subtherapeutic on 1100 units/hr No bleeding or interruptions noted  Goal of Therapy:  Heparin level 0.3-0.7 units/ml Monitor platelets by anticoagulation protocol: Yes   09/19/2020 1127  follow up Hep level = 0.21 units/ml, continues subtherapeuticdespite rate incr to 1250 units/hr   Plan:  Heparin rebolus 1000 units Increase Heparin infusion to 1400 units/hr Check 8 hr Heparin level Daily CBC, daily Heparin level Monitor for s/s thrombosis, bleeding, and for further procedural plans. Cardiology recommends a DOAC if no further procedures are needed  Minda Ditto PharmD 09/19/2020, 12:33 PM

## 2020-09-19 NOTE — Progress Notes (Signed)
ANTICOAGULATION CONSULT NOTE - follow up  Pharmacy Consult for Heparin  Indication: atrial fibrillation  Allergies  Allergen Reactions  . Lisinopril     REACTION: Rash    Patient Measurements: Weight: 72.9 kg (160 lb 11.5 oz) Heparin Dosing Weight:   Vital Signs: Temp: 98.4 F (36.9 C) (05/02 2204) Temp Source: Oral (05/02 2204) BP: 113/71 (05/02 2204) Pulse Rate: 93 (05/02 2204)  Labs: Recent Labs    09/16/20 0359 09/18/20 1032 09/19/20 0031  HGB 12.7* 12.0*  --   HCT 40.1 39.2  --   PLT 190 215  --   HEPARINUNFRC  --   --  0.26*  CREATININE 0.90 1.11  --     Estimated Creatinine Clearance: 46.5 mL/min (by C-G formula based on SCr of 1.11 mg/dL).   Medical History: Past Medical History:  Diagnosis Date  . BENIGN PROSTATIC HYPERTROPHY   . Cataract   . DIVERTICULOSIS, COLON   . DM   . GERD   . Heart murmur   . Hernia   . HYPERCHOLESTEROLEMIA   . HYPERTENSION   . Moderate aortic stenosis 10/30/2011   echo 2013  . Prostate cancer ([place])    s/p seed implant    Medications:  Scheduled:  . aspirin EC  81 mg Oral Daily  . fluticasone  2 spray Each Nare Daily  . furosemide  40 mg Oral Daily  . ipratropium-albuterol  3 mL Nebulization TID  . metoprolol tartrate  12.5 mg Oral BID  . simvastatin  20 mg Oral q1800  . tamsulosin  0.4 mg Oral Daily   Infusions:  . heparin 1,100 Units/hr (09/18/20 1634)    Assessment: 89 yoM presents to ED with SOB and LE edema.  Found to have new onset atrial fibrillation and aortic stenosis.  Pharmacy is consulted on 5/2 to dose heparin for Afib. No PTA anticoagulation reported.  Baseline coags (4/29) INR 1.1 S/p thoracentesis on 4/30 - cytology pending.  Follow up plans if further procedures or biopsy will be needed. CBC:  Hgb down to 12, Plt WNL  HL 0.26 subtherapeutic on 1100 units/hr No bleeding or interruptions noted  Goal of Therapy:  Heparin level 0.3-0.7 units/ml Monitor platelets by anticoagulation protocol:  Yes   Plan:  increase heparin to 1250 units/hr Heparin level in 8 hours  Daily heparin level and CBC Monitor for s/s thrombosis, bleeding, and for further procedural plans. Cardiology recommends a DOAC if no further procedures are needed.     Dolly Rias RPh 09/19/2020, 1:08 AM

## 2020-09-19 NOTE — Progress Notes (Signed)
PROGRESS NOTE    Adam Briggs  ZOX:[id number] DOB: Jan 18, 1930 DOA: 09/15/2020 PCP: [name], MD   Chief Complain: Dyspnea  Brief Narrative: Patient is a 87 year old male with a past medical history of hypertension, dyslipidemia, aortic stenosis, who presents to the emergency department on 4/ 22 with complaints of dyspnea, generalized weakness.  He was seen at his cardiology office where he was found to have pleural effusion, cardiomegaly and he was recommended to go to the emergency department.  On presentation he was tachycardic but saturating fine on room air.  X-ray showed large loculated right pleural effusion.  CT chest confirmed loculated right pleural effusion with irregular pleural thickening suspicious for neoplastic process, moderate left pleural effusion.  PCCM consulted and he underwent thoracentesis.  Waiting for pleural cytology.  Plan for possible PET CT/biopsy.  Hospital course remarkable for persistent new onset A. fib with RVR  Assessment & Plan:   Active Problems:   Essential hypertension   Moderate aortic stenosis   SOB (shortness of breath)   Pleural effusion   Acute respiratory failure with hypoxia (HCC)   Mass of upper lobe of left lung   Atrial fibrillation, new onset (HCC)   Atrial fibrillation (HCC)   S/P thoracentesis   Acute hypoxic respiratory failure: Secondary to bilateral pleural effusion.  Currently on room air.  Continue supplemental oxygen as needed.  Bilateral pleural effusion: X-ray showed large loculated right pleural effusion.  CT chest confirmed loculated right pleural effusion with irregular pleural thickening suspicious for neoplastic process, moderate left pleural effusion.  PCCM consulted and he underwent thoracentesis.  Waiting for pleural cytology.   Started on Lasix daily.  Left upper lobe lung mass/right lung lesion:Plan for possible PET CT/biopsy through bronchoscopy if Cytology is inconclusive.  PCCM following.  New  onset A. fib: On presentation.  Monitor on telemetry.  Requested  cardiology evaluation for decision on starting anticoagulation on this elderly male with multiple problems. CHA2DS2VASc score of 3.  Started on heparin drip with plan to start on Eliquis eventually.  Also started on Lopressor for rate control..  TSH normal  Aortic stenosis: Diagnosed with aortic stenosis but has not followed up last 7 years.  Echo showed moderate aortic stenosis  Hypertension: Continue home Norvasc,metoprolol.  Home losartan/hydrochlorothiazide was held.  Currently blood pressure stable.  Hyperlipidemia: Continue statin         DVT prophylaxis:SCD Code Status: Full Family Communication: daughter at bedside Status is: Inpatient  Remains inpatient appropriate because:Inpatient level of care appropriate due to severity of illness   Dispo: The patient is from: Home              Anticipated d/c is to: Home              Patient currently is not medically stable to d/c.   Difficult to place patient No     Consultants: PCCM  Procedures:Thoracentesisdfgd  Antimicrobials:  Anti-infectives (From admission, onward)   None      Subjective:  Patient seen and examined at bedside this morning.  Hemodynamically stable.  Comfortable.  No active issues  Objective: Vitals:   09/18/20 2204 09/19/20 0430 09/19/20 0500 09/19/20 0739  BP: 113/71 111/77    Pulse: 93 70    Resp: 20 16    Temp: 98.4 F (36.9 C) 97.7 F (36.5 C)    TempSrc: Oral Oral    SpO2: 94% 96%  95%  Weight:   73.3 kg     Intake/Output Summary (  Last 24 hours) at 09/19/2020 0745 Last data filed at 09/19/2020 0430 Gross per 24 hour  Intake 636.09 ml  Output 700 ml  Net -63.91 ml   Filed Weights   09/17/20 0500 09/18/20 0500 09/19/20 0500  Weight: 72.4 kg 72.9 kg 73.3 kg    Examination:  General exam: Overall comfortable, not in distress HEENT: PERRL Respiratory system:  no wheezes or crackles  Cardiovascular system:  Irregularly irregular rhythm Gastrointestinal system: Abdomen is nondistended, soft and nontender. Central nervous system: Alert and oriented Extremities: No edema, no clubbing ,no cyanosis Skin: No rashes, no ulcers,no icterus    Data Reviewed: I have personally reviewed following labs and imaging studies  CBC: Recent Labs  Lab 09/15/20 1132 09/15/20 1550 09/15/20 2047 09/16/20 0359 09/18/20 1032  WBC 5.7 7.4 5.9 5.2 5.0  NEUTROABS  --  5.8  --   --  3.9  HGB 12.2* 12.5* 12.1* 12.7* 12.0*  HCT 37.5* 40.7 39.3 40.1 39.2  MCV 79.6 83.9 84.2 82.2 82.7  PLT 203.0 230 214 190 664   Basic Metabolic Panel: Recent Labs  Lab 09/15/20 1132 09/15/20 1550 09/16/20 0359 09/18/20 1032  NA 139 138 138 136  K 3.9 3.7 3.8 3.7  CL 100 100 99 96*  CO2 31 30 32 32  GLUCOSE 104* 128* 126* 122*  BUN 26* 26* 21 31*  CREATININE 0.98 0.95 0.90 1.11  CALCIUM 9.4 9.3 8.8* 8.8*  MG  --   --  2.0  --   PHOS  --   --  3.7  --    GFR: Estimated Creatinine Clearance: 46.6 mL/min (by C-G formula based on SCr of 1.11 mg/dL). Liver Function Tests: Recent Labs  Lab 09/15/20 1132 09/15/20 1550 09/15/20 2047 09/16/20 0359  AST 11 15  --  16  ALT 12 15  --  13  ALKPHOS 59 62  --  55  BILITOT 0.6 0.5  --  0.5  PROT 6.7 7.1 6.6 6.1*  ALBUMIN 3.9 3.9 3.6 3.3*   No results for input(s): LIPASE, AMYLASE in the last 168 hours. No results for input(s): AMMONIA in the last 168 hours. Coagulation Profile: Recent Labs  Lab 09/15/20 2047  INR 1.1   Cardiac Enzymes: No results for input(s): CKTOTAL, CKMB, CKMBINDEX, TROPONINI in the last 168 hours. BNP (last 3 results) Recent Labs    09/15/20 1132  PROBNP 238.0*   HbA1C: No results for input(s): HGBA1C in the last 72 hours. CBG: Recent Labs  Lab 09/18/20 0726 09/18/20 1212 09/18/20 1631 09/18/20 2201 09/19/20 0742  GLUCAP 95 113* 119* 105* 100*   Lipid Profile: Recent Labs    09/19/20 0031  CHOL 117  HDL 36*  LDLCALC 72   TRIG 43  CHOLHDL 3.3   Thyroid Function Tests: Recent Labs    09/18/20 1720  TSH 3.642   Anemia Panel: No results for input(s): VITAMINB12, FOLATE, FERRITIN, TIBC, IRON, RETICCTPCT in the last 72 hours. Sepsis Labs: Recent Labs  Lab 09/15/20 1550  LATICACIDVEN 1.1    Recent Results (from the past 240 hour(s))  Resp Panel by RT-PCR (Flu A&B, Covid) Nasopharyngeal Swab     Status: None   Collection Time: 09/15/20  3:43 PM   Specimen: Nasopharyngeal Swab; Nasopharyngeal(NP) swabs in vial transport medium  Result Value Ref Range Status   SARS Coronavirus 2 by RT PCR NEGATIVE NEGATIVE Final    Comment: (NOTE) SARS-CoV-2 target nucleic acids are NOT DETECTED.  The SARS-CoV-2 RNA is generally detectable in  upper respiratory specimens during the acute phase of infection. The lowest concentration of SARS-CoV-2 viral copies this assay can detect is 138 copies/mL. A negative result does not preclude SARS-Cov-2 infection and should not be used as the sole basis for treatment or other patient management decisions. A negative result may occur with  improper specimen collection/handling, submission of specimen other than nasopharyngeal swab, presence of viral mutation(s) within the areas targeted by this assay, and inadequate number of viral copies(<138 copies/mL). A negative result must be combined with clinical observations, patient history, and epidemiological information. The expected result is Negative.  Fact Sheet for Patients:  EntrepreneurPulse.com.au  Fact Sheet for Healthcare Providers:  IncredibleEmployment.be  This test is no t yet approved or cleared by the [place] FDA and  has been authorized for detection and/or diagnosis of SARS-CoV-2 by FDA under an Emergency Use Authorization (EUA). This EUA will remain  in effect (meaning this test can be used) for the duration of the COVID-19 declaration under Section 564(b)(1) of the  Act, 21 U.S.C.section 360bbb-3(b)(1), unless the authorization is terminated  or revoked sooner.       Influenza A by PCR NEGATIVE NEGATIVE Final   Influenza B by PCR NEGATIVE NEGATIVE Final    Comment: (NOTE) The Xpert Xpress SARS-CoV-2/FLU/RSV plus assay is intended as an aid in the diagnosis of influenza from Nasopharyngeal swab specimens and should not be used as a sole basis for treatment. Nasal washings and aspirates are unacceptable for Xpert Xpress SARS-CoV-2/FLU/RSV testing.  Fact Sheet for Patients: EntrepreneurPulse.com.au  Fact Sheet for Healthcare Providers: IncredibleEmployment.be  This test is not yet approved or cleared by the [place] FDA and has been authorized for detection and/or diagnosis of SARS-CoV-2 by FDA under an Emergency Use Authorization (EUA). This EUA will remain in effect (meaning this test can be used) for the duration of the COVID-19 declaration under Section 564(b)(1) of the Act, 21 U.S.C. section 360bbb-3(b)(1), unless the authorization is terminated or revoked.  Performed at [facility], [employer] [address] [place], [place] [address]   Blood culture (routine x 2)     Status: None (Preliminary result)   Collection Time: 09/15/20  3:43 PM   Specimen: BLOOD  Result Value Ref Range Status   Specimen Description   Final    BLOOD LEFT ANTECUBITAL Performed at [facility] [address], [place], [place] [address]    Special Requests   Final    BOTTLES DRAWN AEROBIC AND ANAEROBIC Blood Culture adequate volume Performed at [facility] [address], [place], [place] [address]    Culture   Final    NO GROWTH 3 DAYS Performed at [facility], [place] [address], [place], [place] [address]    Report Status PENDING  Incomplete  Blood culture (routine x 2)     Status: None (Preliminary result)   Collection Time: 09/15/20  3:48 PM   Specimen:  BLOOD  Result Value Ref Range Status   Specimen Description   Final    BLOOD RIGHT ANTECUBITAL Performed at [facility] [address], [place], [place] [address]    Special Requests   Final    BOTTLES DRAWN AEROBIC AND ANAEROBIC Blood Culture adequate volume Performed at [facility] [address], [place], [place] [address]    Culture   Final    NO GROWTH 3 DAYS Performed at [facility], [place] [address], [place], [place] [address]  Report Status PENDING  Incomplete  Body fluid culture w Gram Stain     Status: None (Preliminary result)   Collection Time: 09/16/20  9:22 AM   Specimen: Pleura  Result Value Ref Range Status   Specimen Description   Final    PLEURAL RIGHT Performed at [facility] [address], [place], [place] [address]    Special Requests   Final    NONE Performed at [facility], [place] [address] [place], [place] [address]    Gram Stain   Final    FEW WBC PRESENT, PREDOMINANTLY MONONUCLEAR NO ORGANISMS SEEN    Culture   Final    NO GROWTH 2 DAYS Performed at [facility] [address] [place], [place] [address]    Report Status PENDING  Incomplete         Radiology Studies: No results found.      Scheduled Meds: . aspirin EC  81 mg Oral Daily  . fluticasone  2 spray Each Nare Daily  . furosemide  40 mg Oral Daily  . ipratropium-albuterol  3 mL Nebulization TID  . metoprolol tartrate  12.5 mg Oral BID  . simvastatin  20 mg Oral q1800  . tamsulosin  0.4 mg Oral Daily   Continuous Infusions: . heparin 1,250 Units/hr (09/19/20 0359)     LOS: 4 days    Time spent: 35 mins.More than 50% of that time was spent in counseling and/or coordination of care.      Shelly Coss, MD [facility] P5/07/2020, 7:45 AM l;l

## 2020-09-19 NOTE — Progress Notes (Signed)
ANTICOAGULATION CONSULT NOTE - follow up  Pharmacy Consult for Heparin  Indication: atrial fibrillation  Allergies  Allergen Reactions  . Lisinopril     REACTION: Rash    Patient Measurements: Weight: 73.3 kg (161 lb 9.6 oz) Heparin Dosing Weight: 73.3 kg  Vital Signs: Temp: 97.9 F (36.6 C) (05/03 2042) Temp Source: Oral (05/03 2042) BP: 110/56 (05/03 2042) Pulse Rate: 88 (05/03 2042)  Labs: Recent Labs    09/18/20 1032 09/19/20 0031 09/19/20 1127 09/19/20 2054  HGB 12.0*  --   --   --   HCT 39.2  --   --   --   PLT 215  --   --   --   HEPARINUNFRC  --  0.26* 0.21* 0.50  CREATININE 1.11  --   --   --     Estimated Creatinine Clearance: 46.6 mL/min (by C-G formula based on SCr of 1.11 mg/dL).   Medical History: Past Medical History:  Diagnosis Date  . BENIGN PROSTATIC HYPERTROPHY   . Cataract   . DIVERTICULOSIS, COLON   . DM   . GERD   . Heart murmur   . Hernia   . HYPERCHOLESTEROLEMIA   . HYPERTENSION   . Moderate aortic stenosis 10/30/2011   echo 2013  . Prostate cancer ([place])    s/p seed implant    Medications:  Scheduled:  . fluticasone  2 spray Each Nare Daily  . furosemide  40 mg Oral Daily  . ipratropium-albuterol  3 mL Nebulization BID  . metoprolol tartrate  25 mg Oral BID  . simvastatin  20 mg Oral q1800  . tamsulosin  0.4 mg Oral Daily   Infusions:  . heparin 1,400 Units/hr (09/19/20 1305)    Assessment: 56 yoM presents to ED with SOB and LE edema.  Found to have new onset atrial fibrillation and aortic stenosis.  Pharmacy is consulted on 5/2 to dose heparin for Afib. No PTA anticoagulation reported.  Baseline coags (4/29) INR 1.1 S/p thoracentesis on 4/30 - cytology pending.  Follow up plans if further procedures or biopsy will be needed. CBC:  Hgb down to 12, Plt WNL   Goal of Therapy:  Heparin level 0.3-0.7 units/ml Monitor platelets by anticoagulation protocol: Yes   09/19/2020 1127 follow up Hep level = 0.21 units/ml,  continues subtherapeuticdespite rate incr to 1250 units/hr 2054 HL 0.5 therapeutic on 1400 units/hr   Plan:  continue Heparin infusion to 1400 units/hr Heparin level with am labs Daily CBC, daily Heparin level Monitor for s/s thrombosis, bleeding, and for further procedural plans. Cardiology recommends a DOAC if no further procedures are needed  Dolly Rias RPh 09/19/2020, 9:56 PM

## 2020-09-19 NOTE — Plan of Care (Signed)
  Problem: Education: Goal: Knowledge of General Education information will improve Description: Including pain rating scale, medication(s)/side effects and non-pharmacologic comfort measures Outcome: Progressing   Problem: Health Behavior/Discharge Planning: Goal: Ability to manage health-related needs will improve Outcome: Progressing   Problem: Clinical Measurements: Goal: Ability to maintain clinical measurements within normal limits will improve Outcome: Progressing Goal: Will remain free from infection Outcome: Progressing Goal: Diagnostic test results will improve Outcome: Progressing Goal: Respiratory complications will improve Outcome: Progressing Goal: Cardiovascular complication will be avoided Outcome: Progressing   Problem: Activity: Goal: Risk for activity intolerance will decrease Outcome: Progressing   Problem: Nutrition: Goal: Adequate nutrition will be maintained Outcome: Progressing   Problem: Elimination: Goal: Will not experience complications related to bowel motility Outcome: Progressing Goal: Will not experience complications related to urinary retention Outcome: Progressing   Problem: Pain Managment: Goal: General experience of comfort will improve Outcome: Progressing   Problem: Safety: Goal: Ability to remain free from injury will improve Description: Patient will remain free from injury by 09/19/20 Outcome: Progressing   Problem: Skin Integrity: Goal: Risk for impaired skin integrity will decrease Outcome: Progressing

## 2020-09-20 ENCOUNTER — Inpatient Hospital Stay (HOSPITAL_COMMUNITY): Payer: Medicare Other

## 2020-09-20 DIAGNOSIS — I350 Nonrheumatic aortic (valve) stenosis: Secondary | ICD-10-CM | POA: Diagnosis not present

## 2020-09-20 DIAGNOSIS — J90 Pleural effusion, not elsewhere classified: Secondary | ICD-10-CM | POA: Diagnosis not present

## 2020-09-20 DIAGNOSIS — I4891 Unspecified atrial fibrillation: Secondary | ICD-10-CM

## 2020-09-20 DIAGNOSIS — R0602 Shortness of breath: Secondary | ICD-10-CM | POA: Diagnosis not present

## 2020-09-20 DIAGNOSIS — I10 Essential (primary) hypertension: Secondary | ICD-10-CM | POA: Diagnosis not present

## 2020-09-20 LAB — GLUCOSE, CAPILLARY
Glucose-Capillary: 87 mg/dL (ref 70–99)
Glucose-Capillary: 126 mg/dL — ABNORMAL HIGH (ref 70–99)
Glucose-Capillary: 117 mg/dL — ABNORMAL HIGH (ref 70–99)

## 2020-09-20 LAB — CULTURE, BLOOD (ROUTINE X 2)
Culture: NO GROWTH
Culture: NO GROWTH
Special Requests: ADEQUATE
Special Requests: ADEQUATE

## 2020-09-20 LAB — ECHOCARDIOGRAM COMPLETE
AR max vel: 0.71 cm2
AV Area VTI: 0.73 cm2
AV Area mean vel: 0.73 cm2
AV Mean grad: 19 mmHg
AV Peak grad: 35.6 mmHg
Ao pk vel: 2.99 m/s
Height: 70 in
S' Lateral: 2.59 cm
Weight: 2515.01 oz

## 2020-09-20 LAB — OCCULT BLOOD X 1 CARD TO LAB, STOOL: Fecal Occult Bld: NEGATIVE

## 2020-09-20 LAB — BODY FLUID CULTURE W GRAM STAIN: Culture: NO GROWTH

## 2020-09-20 LAB — HEPARIN LEVEL (UNFRACTIONATED): Heparin Unfractionated: 0.66 IU/mL (ref 0.30–0.70)

## 2020-09-20 LAB — HEMOGLOBIN AND HEMATOCRIT, BLOOD
HCT: 38.3 % — ABNORMAL LOW (ref 39.0–52.0)
Hemoglobin: 11.6 g/dL — ABNORMAL LOW (ref 13.0–17.0)

## 2020-09-20 MED ORDER — SENNOSIDES-DOCUSATE SODIUM 8.6-50 MG PO TABS
1.0000 | ORAL_TABLET | Freq: Two times a day (BID) | ORAL | Status: DC
  Administered 2020-09-20 – 2020-09-28 (×15): 1 via ORAL
  Filled 2020-09-20 (×16): qty 1

## 2020-09-20 MED ORDER — IOHEXOL 300 MG/ML  SOLN
75.0000 mL | Freq: Once | INTRAMUSCULAR | Status: AC | PRN
  Administered 2020-09-20: 75 mL via INTRAVENOUS

## 2020-09-20 MED ORDER — POLYETHYLENE GLYCOL 3350 17 G PO PACK
17.0000 g | PACK | Freq: Every day | ORAL | Status: DC
  Administered 2020-09-22 – 2020-09-28 (×7): 17 g via ORAL
  Filled 2020-09-20 (×8): qty 1

## 2020-09-20 NOTE — Progress Notes (Signed)
ANTICOAGULATION CONSULT NOTE - follow up  Pharmacy Consult for Heparin  Indication: atrial fibrillation  Allergies  Allergen Reactions  . Lisinopril     REACTION: Rash    Patient Measurements: Weight: 71.3 kg (157 lb 3 oz) Heparin Dosing Weight: 73.3 kg  Vital Signs: Temp: 97.8 F (36.6 C) (05/04 0542) Temp Source: Oral (05/04 0542) BP: 102/67 (05/04 0542) Pulse Rate: 68 (05/04 0542)  Labs: Recent Labs    09/18/20 1032 09/19/20 0031 09/19/20 1127 09/19/20 2054 09/20/20 0419 09/20/20 1236  HGB 12.0*  --   --   --   --  11.6*  HCT 39.2  --   --   --   --  38.3*  PLT 215  --   --   --   --   --   HEPARINUNFRC  --    < > 0.21* 0.50 0.66  --   CREATININE 1.11  --   --   --   --   --    < > = values in this interval not displayed.    Estimated Creatinine Clearance: 45.5 mL/min (by C-G formula based on SCr of 1.11 mg/dL).   Medical History: Past Medical History:  Diagnosis Date  . BENIGN PROSTATIC HYPERTROPHY   . Cataract   . DIVERTICULOSIS, COLON   . DM   . GERD   . Heart murmur   . Hernia   . HYPERCHOLESTEROLEMIA   . HYPERTENSION   . Moderate aortic stenosis 10/30/2011   echo 2013  . Prostate cancer ([place])    s/p seed implant    Medications:  Scheduled:  . fluticasone  2 spray Each Nare Daily  . furosemide  40 mg Oral Daily  . ipratropium-albuterol  3 mL Nebulization BID  . metoprolol tartrate  25 mg Oral BID  . polyethylene glycol  17 g Oral Daily  . senna-docusate  1 tablet Oral BID  . simvastatin  20 mg Oral q1800  . tamsulosin  0.4 mg Oral Daily   Infusions:  . heparin 1,400 Units/hr (09/20/20 0353)    Assessment: 46 yoM presents to ED with SOB and LE edema.  Found to have new onset atrial fibrillation and aortic stenosis.  Pharmacy is consulted on 5/2 to dose heparin for Afib. No PTA anticoagulation reported.  Baseline coags (4/29) INR 1.1 S/p thoracentesis on 4/30 - cytology pending.  Follow up plans if further procedures or biopsy will be  needed. CBC:  Hgb down to 12, Plt WNL  Goal of Therapy:  Heparin level 0.3-0.7 units/ml Monitor platelets by anticoagulation protocol: Yes   09/20/2020 HL 0.66 therapeutic on 1400 units/hr No bleeding noted early am Hgb 11.6, some GI bleed noted per Cards note   Plan:  Empirically reduce Heparin infusion to 1350 units/hr with possible bleed Daily CBC, daily Heparin level Monitor for s/s thrombosis, bleeding, and for further procedural plans. Cardiology recommends Apixaban 5mg  bid if no further procedures needed, monitor blood per rectum  Minda Ditto PharmD 09/20/2020, 1:49 PM

## 2020-09-20 NOTE — Progress Notes (Addendum)
NAME:  Adam Briggs, MRN:  [id number], DOB:  January 16, 1930, LOS: 5 ADMISSION DATE:  09/15/2020, CONSULTATION DATE:  4/29 REFERRING MD:  Maralyn Sago, CHIEF COMPLAINT:  Pleural effusion and dyspnea   History of Present Illness:  87 year old male, very active, who presented to ED on 4/29 w/ about 1 month h/o progressive SOB & LE edema. Went to PCP who sent to ER for further eval.  In the ER CBC w/out leukocytosis, BNP 238, trop neg, Lactic acid 1.1, CT chest showed large loculated right effusion w/ R lateral pleural thickening, and RLL consolidation, and LUL lung mass w/ mediastinal involvement and small left effusion. There was also small low density foci w/in the right liver. Because of this PCCM was consulted.   Pertinent  Medical History  HTN, Hyperlipidemia, aortic stenosis, BPH, Diabetes, GERD, Prostate cancer s/p seed implant, inguinal hernia   Significant Hospital Events: Including procedures, antibiotic start and stop dates in addition to other pertinent events   . 4/29 Pulm consulted for pleural effusion and dyspnea. Thora completed LD 134 (exudative by LDH), protein <3, TNC 339. CXR post thora with residual loculated effusion  . 5/04 On RA, pathology returned with no malignant cells   Interim History / Subjective:  Pt reports feeling better since admit  On RA  Daughter at bedside   Objective   Blood pressure 102/67, pulse 68, temperature 97.8 F (36.6 C), temperature source Oral, resp. rate 18, weight 71.3 kg, SpO2 94 %.        Intake/Output Summary (Last 24 hours) at 09/20/2020 1311 Last data filed at 09/20/2020 0900 Gross per 24 hour  Intake 480 ml  Output 825 ml  Net -345 ml   Filed Weights   09/18/20 0500 09/19/20 0500 09/20/20 0542  Weight: 72.9 kg 73.3 kg 71.3 kg    Examination: General: elderly adult male lying in bed in AND  HEENT: MM pink/moist, good dentition, mild HOH  Neuro: AAOx4, speech clear, MAE CV: s1s2 RRR, SEM PULM: non-labored on RA, lungs  bilaterally clear, diminished on right  GI: soft, bsx4 active  Extremities: warm/dry, no edema  Skin: no rashes or lesions   Labs/imaging that I havepersonally reviewed  (right click and "Reselect all SmartList Selections" daily)  Pathology  CXR, CT images  Pleural fluid labs Most recent CBC, BMP  Resolved Hospital Problem list     Assessment & Plan:   Large loculated right effusion, Right lateral pleural thickening vs mass. LUL mass, left sided adenopathy, mod sized left effusion w/ associated dyspnea  Hypodense liver lesions No clear hx of prior pulmonary infection, aspiration.  Former smoker.  S/p thoracentesis with 800 ml pleural fluid removed. Exudate by LDH.  Possible trapped lung post procedure given pain at the end.  Post CXR shows residual loculated fluid.  Cytology negative.   -reviewed cytology with patient and family, discussed 50/50 of finding malignant cells on first thora  -reviewed residual loculated effusion with patient -consider repeat imaging, if residual small given age, may be able to leave fluid. If large volume, consider IR guided CT placement with tPA/dornase for drainage -He will still need PET CT as outpatient to follow up left upper lobe mass.   -will need follow up with Pulmonary in office post PET CT  -consider IR consultation for possible CT guided CT, would not be amenable for bedside placement  -pulmonary hygiene -IS, mobilize   Attending to follow.    [name], MSN, APRN, NP-C, AGACNP-BC [facility] 09/20/2020, 1:14 PM   Please see [email or web address] for pager details.   From 7A-7P if no response, please call [phone] After hours, please call ELink [phone]

## 2020-09-20 NOTE — Progress Notes (Signed)
Request to IR for possible right pigtail chest tube placement due to residual loculated pleural effusion on post thoracentesis CXR 4/29.  Discussed with IR attending, Dr. Laurence Ferrari, who recommends CT chest w/contrast to evaluate residual and if sufficient fluid remains can place pigtail chest tube tomorrow pending IR schedule. CT chest w/contrast ordered for tonight and will be reviewed by IR attending in AM, patient to be NPO at midnight in case we are to proceed.  Candiss Norse, PA-C

## 2020-09-20 NOTE — Progress Notes (Signed)
ANTICOAGULATION CONSULT NOTE - follow up  Pharmacy Consult for Heparin  Indication: atrial fibrillation  Allergies  Allergen Reactions  . Lisinopril     REACTION: Rash    Patient Measurements: Weight: 73.3 kg (161 lb 9.6 oz) Heparin Dosing Weight: 73.3 kg  Vital Signs: Temp: 97.9 F (36.6 C) (05/03 2042) Temp Source: Oral (05/03 2042) BP: 110/56 (05/03 2042) Pulse Rate: 88 (05/03 2042)  Labs: Recent Labs    09/18/20 1032 09/19/20 0031 09/19/20 1127 09/19/20 2054 09/20/20 0419  HGB 12.0*  --   --   --   --   HCT 39.2  --   --   --   --   PLT 215  --   --   --   --   HEPARINUNFRC  --    < > 0.21* 0.50 0.66  CREATININE 1.11  --   --   --   --    < > = values in this interval not displayed.    Estimated Creatinine Clearance: 46.6 mL/min (by C-G formula based on SCr of 1.11 mg/dL).   Medical History: Past Medical History:  Diagnosis Date  . BENIGN PROSTATIC HYPERTROPHY   . Cataract   . DIVERTICULOSIS, COLON   . DM   . GERD   . Heart murmur   . Hernia   . HYPERCHOLESTEROLEMIA   . HYPERTENSION   . Moderate aortic stenosis 10/30/2011   echo 2013  . Prostate cancer ([place])    s/p seed implant    Medications:  Scheduled:  . fluticasone  2 spray Each Nare Daily  . furosemide  40 mg Oral Daily  . ipratropium-albuterol  3 mL Nebulization BID  . metoprolol tartrate  25 mg Oral BID  . simvastatin  20 mg Oral q1800  . tamsulosin  0.4 mg Oral Daily   Infusions:  . heparin 1,400 Units/hr (09/20/20 0353)    Assessment: 61 yoM presents to ED with SOB and LE edema.  Found to have new onset atrial fibrillation and aortic stenosis.  Pharmacy is consulted on 5/2 to dose heparin for Afib. No PTA anticoagulation reported.  Baseline coags (4/29) INR 1.1 S/p thoracentesis on 4/30 - cytology pending.  Follow up plans if further procedures or biopsy will be needed. CBC:  Hgb down to 12, Plt WNL   Goal of Therapy:  Heparin level 0.3-0.7 units/ml Monitor platelets by  anticoagulation protocol: Yes   09/20/2020 HL 0.66 therapeutic on 1400 units/hr No bleeding noted   Plan:  continue Heparin infusion to 1400 units/hr Daily CBC, daily Heparin level Monitor for s/s thrombosis, bleeding, and for further procedural plans. Cardiology recommends a DOAC if no further procedures are needed  Dolly Rias RPh 09/20/2020, 5:00 AM

## 2020-09-20 NOTE — Progress Notes (Signed)
Progress Note  Patient Name: Adam Briggs Date of Encounter: 09/20/2020  [facility] Cardiologist: Previously seen by Dr. Percival Spanish in 2015  Subjective    No chest pain, sob or palpitations. Complains of abdominal pain.   Inpatient Medications    Scheduled Meds: . fluticasone  2 spray Each Nare Daily  . furosemide  40 mg Oral Daily  . ipratropium-albuterol  3 mL Nebulization BID  . metoprolol tartrate  25 mg Oral BID  . simvastatin  20 mg Oral q1800  . tamsulosin  0.4 mg Oral Daily   Continuous Infusions: . heparin 1,400 Units/hr (09/20/20 0353)   PRN Meds: acetaminophen **OR** acetaminophen, alum & mag hydroxide-simeth, levalbuterol, metoprolol tartrate, ondansetron **OR** ondansetron (ZOFRAN) IV, oxyCODONE   Vital Signs    Vitals:   09/19/20 2008 09/19/20 2042 09/20/20 0542 09/20/20 0745  BP:  (!) 110/56 102/67   Pulse:  88 68   Resp:  18 18   Temp:  97.9 F (36.6 C) 97.8 F (36.6 C)   TempSrc:  Oral Oral   SpO2: 93% 96% 95% 94%  Weight:   71.3 kg     Intake/Output Summary (Last 24 hours) at 09/20/2020 1126 Last data filed at 09/20/2020 0900 Gross per 24 hour  Intake 480 ml  Output 825 ml  Net -345 ml   Last 3 Weights 09/20/2020 09/19/2020 09/18/2020  Weight (lbs) 157 lb 3 oz 161 lb 9.6 oz 160 lb 11.5 oz  Weight (kg) 71.3 kg 73.3 kg 72.9 kg      Telemetry    Atrial fibrillation at 80-90s, intermittent brief 120s - Personally Reviewed  ECG    N/A  Physical Exam   GEN: No acute distress.   Neck: No JVD Cardiac: Irregular, 3/6 systolic murmurs, rubs, or gallops.  Respiratory: Clear to auscultation bilaterally. GI: Soft,  non-distended  MS: No edema; No deformity. Neuro:  Nonfocal  Psych: Normal affect   Labs    High Sensitivity Troponin:   Recent Labs  Lab 09/15/20 1550 09/15/20 1740  TROPONINIHS 8 8      Chemistry Recent Labs  Lab 09/15/20 1132 09/15/20 1550 09/15/20 2047 09/16/20 0359 09/18/20 1032  NA 139 138  --  138 136  K 3.9  3.7  --  3.8 3.7  CL 100 100  --  99 96*  CO2 31 30  --  32 32  GLUCOSE 104* 128*  --  126* 122*  BUN 26* 26*  --  21 31*  CREATININE 0.98 0.95  --  0.90 1.11  CALCIUM 9.4 9.3  --  8.8* 8.8*  PROT 6.7 7.1 6.6 6.1*  --   ALBUMIN 3.9 3.9 3.6 3.3*  --   AST 11 15  --  16  --   ALT 12 15  --  13  --   ALKPHOS 59 62  --  55  --   BILITOT 0.6 0.5  --  0.5  --   GFRNONAA  --  >60  --  >60 >60  ANIONGAP  --  8  --  7 8     Hematology Recent Labs  Lab 09/15/20 2047 09/16/20 0359 09/18/20 1032  WBC 5.9 5.2 5.0  RBC 4.67 4.88 4.74  HGB 12.1* 12.7* 12.0*  HCT 39.3 40.1 39.2  MCV 84.2 82.2 82.7  MCH 25.9* 26.0 25.3*  MCHC 30.8 31.7 30.6  RDW 15.9* 15.9* 15.9*  PLT 214 190 215    BNP Recent Labs  Lab 09/15/20 1132  09/15/20 1550  BNP  --  238.0*  PROBNP 238.0*  --      DDimer No results for input(s): DDIMER in the last 168 hours.   Radiology    No results found.  Cardiac Studies   Echo 09/16/20 IMPRESSIONS    1. LV not visualized.  2. RV not visualized.  3. Moderate pleural effusion in the right lateral region.  4. The aortic valve was not well visualized. Moderate aortic valve  stenosis. Aortic valve mean gradient measures 26.0 mmHg. Aortic valve Vmax  measures 3.47 m/s.   Patient Profile     87 y.o. male with a historyof moderate aortic stenosis, hypertension, hyperlipidemia, diabetes mellitus, GERD, and BPHwho is being seen today for the evaluation of new onset atrial fibrillation and aortic stenosisat the request of Dr. Tawanna Solo.  Assessment & Plan   1. New onset afib - Hr improving with up-titrating metoprolol.  - TSH normal - CHA2DS2-VASc = 4 (HTN, DM, age x2). Currently on IV Heparin. Would recommend starting DOAC when it is clear that no other procedures - Pending repeat echo   2. Bilateral pleural effusion  - S/p right thoracentesis on 4/29 with removal of 900cc of sanguinous fluid. Cytology pending. - Per primary team   3. Aortic  stenosis - Mean gradient 26.0 mmHg  - Follow with yearly echo  4. Abdomal pain/ ? Bleeding   - Daughter reported mild bleeding - Check stool guaiac and hemoglobin  - Continue heparin for now - Per primary team     For questions or updates, please contact [facility] Please consult [email or web address] for contact info under        SignedLeanor Kail, PA  09/20/2020, 11:26 AM

## 2020-09-20 NOTE — Progress Notes (Signed)
  Echocardiogram 2D Echocardiogram has been performed.  Jennette Dubin 09/20/2020, 11:58 AM

## 2020-09-20 NOTE — Progress Notes (Addendum)
PROGRESS NOTE    Adam Briggs  WJX:[id number] DOB: Jul 17, 1929 DOA: 09/15/2020 PCP: [name], MD   Chief Complain: Dyspnea  Brief Narrative: Patient is a 87 year old male with a past medical history of hypertension, dyslipidemia, aortic stenosis, who presents to the emergency department on 4/22 with complaints of dyspnea, generalized weakness.  He was seen at his cardiology office where he was found to have pleural effusion, cardiomegaly and he was recommended to go to the emergency department.  On presentation he was tachycardic but saturating fine on room air.  X-ray showed large loculated right pleural effusion.  CT chest confirmed loculated right pleural effusion with irregular pleural thickening suspicious for neoplastic process, moderate left pleural effusion.  PCCM consulted and he underwent thoracentesis.  Waiting for pleural cytology.  Plan for possible PET CT/biopsy.  Hospital course remarkable for persistent new onset A. fib with RVR  Assessment & Plan:   Active Problems:   Essential hypertension   Moderate aortic stenosis   SOB (shortness of breath)   Pleural effusion   Acute respiratory failure with hypoxia (HCC)   Mass of upper lobe of left lung   Atrial fibrillation, new onset (HCC)   Atrial fibrillation (HCC)   S/P thoracentesis   Acute hypoxic respiratory failure: Secondary to bilateral pleural effusion.  Currently on room air.  Continue supplemental oxygen as needed.  Bilateral pleural effusion: X-ray showed large loculated right pleural effusion.  CT chest confirmed loculated right pleural effusion with irregular pleural thickening suspicious for neoplastic process, moderate left pleural effusion.  PCCM consulted and he underwent thoracentesis.  Waiting for pleural cytology report   Started on Lasix daily.  Left upper lobe lung mass/right lung lesion:Plan for possible PET CT/biopsy through bronchoscopy if Cytology is inconclusive.  PCCM following.  New  onset A. fib: On presentation.  Monitor on telemetry.  Requested  cardiology evaluation for decision on starting anticoagulation on this elderly male with multiple problems. CHA2DS2VASc score of 3.  Started on heparin drip with plan to start on Eliquis eventually.  Also started on Lopressor for rate control. TSH normal.  Currently rate is controlled.  Aortic stenosis: Diagnosed with aortic stenosis but has not followed up last 7 years.  Echo showed moderate aortic stenosis  Hypertension: Continue home Norvasc,metoprolol.  Home losartan/hydrochlorothiazide was held.  Currently blood pressure stable.  Hyperlipidemia: Continue statin         DVT prophylaxis:SCD Code Status: Full Family Communication: daughter at bedside on 09/19/20 Status is: Inpatient  Remains inpatient appropriate because:Inpatient level of care appropriate due to severity of illness   Dispo: The patient is from: Home              Anticipated d/c is to: Home              Patient currently is not medically stable to d/c.   Difficult to place patient No     Consultants: PCCM  Procedures:Thoracentesisdfgd  Antimicrobials:  Anti-infectives (From admission, onward)   None      Subjective:  Patient seen and examined at bedside this morning.  Hemodynamically stable.  Comfortable.  No active issues  Objective: Vitals:   09/19/20 2008 09/19/20 2042 09/20/20 0542 09/20/20 0745  BP:  (!) 110/56 102/67   Pulse:  88 68   Resp:  18 18   Temp:  97.9 F (36.6 C) 97.8 F (36.6 C)   TempSrc:  Oral Oral   SpO2: 93% 96% 95% 94%  Weight:   71.3  kg     Intake/Output Summary (Last 24 hours) at 09/20/2020 0751 Last data filed at 09/19/2020 2245 Gross per 24 hour  Intake 480 ml  Output 825 ml  Net -345 ml   Filed Weights   09/18/20 0500 09/19/20 0500 09/20/20 0542  Weight: 72.9 kg 73.3 kg 71.3 kg    Examination:  General exam: Overall comfortable, not in distress HEENT: PERRL Respiratory system:  no wheezes  or crackles  Cardiovascular system: Irregularly irregular rhythm.  Gastrointestinal system: Abdomen is nondistended, soft and nontender. Central nervous system: Alert and oriented Extremities: No edema, no clubbing ,no cyanosis Skin: No rashes, no ulcers,no icterus     Data Reviewed: I have personally reviewed following labs and imaging studies  CBC: Recent Labs  Lab 09/15/20 1132 09/15/20 1550 09/15/20 2047 09/16/20 0359 09/18/20 1032  WBC 5.7 7.4 5.9 5.2 5.0  NEUTROABS  --  5.8  --   --  3.9  HGB 12.2* 12.5* 12.1* 12.7* 12.0*  HCT 37.5* 40.7 39.3 40.1 39.2  MCV 79.6 83.9 84.2 82.2 82.7  PLT 203.0 230 214 190 893   Basic Metabolic Panel: Recent Labs  Lab 09/15/20 1132 09/15/20 1550 09/16/20 0359 09/18/20 1032  NA 139 138 138 136  K 3.9 3.7 3.8 3.7  CL 100 100 99 96*  CO2 31 30 32 32  GLUCOSE 104* 128* 126* 122*  BUN 26* 26* 21 31*  CREATININE 0.98 0.95 0.90 1.11  CALCIUM 9.4 9.3 8.8* 8.8*  MG  --   --  2.0  --   PHOS  --   --  3.7  --    GFR: Estimated Creatinine Clearance: 45.5 mL/min (by C-G formula based on SCr of 1.11 mg/dL). Liver Function Tests: Recent Labs  Lab 09/15/20 1132 09/15/20 1550 09/15/20 2047 09/16/20 0359  AST 11 15  --  16  ALT 12 15  --  13  ALKPHOS 59 62  --  55  BILITOT 0.6 0.5  --  0.5  PROT 6.7 7.1 6.6 6.1*  ALBUMIN 3.9 3.9 3.6 3.3*   No results for input(s): LIPASE, AMYLASE in the last 168 hours. No results for input(s): AMMONIA in the last 168 hours. Coagulation Profile: Recent Labs  Lab 09/15/20 2047  INR 1.1   Cardiac Enzymes: No results for input(s): CKTOTAL, CKMB, CKMBINDEX, TROPONINI in the last 168 hours. BNP (last 3 results) Recent Labs    09/15/20 1132  PROBNP 238.0*   HbA1C: No results for input(s): HGBA1C in the last 72 hours. CBG: Recent Labs  Lab 09/19/20 0742 09/19/20 1141 09/19/20 1618 09/19/20 2040 09/20/20 0714  GLUCAP 100* 138* 113* 97 87   Lipid Profile: Recent Labs    09/19/20 0031   CHOL 117  HDL 36*  LDLCALC 72  TRIG 43  CHOLHDL 3.3   Thyroid Function Tests: Recent Labs    09/18/20 1720  TSH 3.642   Anemia Panel: No results for input(s): VITAMINB12, FOLATE, FERRITIN, TIBC, IRON, RETICCTPCT in the last 72 hours. Sepsis Labs: Recent Labs  Lab 09/15/20 1550  LATICACIDVEN 1.1    Recent Results (from the past 240 hour(s))  Resp Panel by RT-PCR (Flu A&B, Covid) Nasopharyngeal Swab     Status: None   Collection Time: 09/15/20  3:43 PM   Specimen: Nasopharyngeal Swab; Nasopharyngeal(NP) swabs in vial transport medium  Result Value Ref Range Status   SARS Coronavirus 2 by RT PCR NEGATIVE NEGATIVE Final    Comment: (NOTE) SARS-CoV-2 target nucleic acids are NOT  DETECTED.  The SARS-CoV-2 RNA is generally detectable in upper respiratory specimens during the acute phase of infection. The lowest concentration of SARS-CoV-2 viral copies this assay can detect is 138 copies/mL. A negative result does not preclude SARS-Cov-2 infection and should not be used as the sole basis for treatment or other patient management decisions. A negative result may occur with  improper specimen collection/handling, submission of specimen other than nasopharyngeal swab, presence of viral mutation(s) within the areas targeted by this assay, and inadequate number of viral copies(<138 copies/mL). A negative result must be combined with clinical observations, patient history, and epidemiological information. The expected result is Negative.  Fact Sheet for Patients:  EntrepreneurPulse.com.au  Fact Sheet for Healthcare Providers:  IncredibleEmployment.be  This test is no t yet approved or cleared by the [place] FDA and  has been authorized for detection and/or diagnosis of SARS-CoV-2 by FDA under an Emergency Use Authorization (EUA). This EUA will remain  in effect (meaning this test can be used) for the duration of the COVID-19  declaration under Section 564(b)(1) of the Act, 21 U.S.C.section 360bbb-3(b)(1), unless the authorization is terminated  or revoked sooner.       Influenza A by PCR NEGATIVE NEGATIVE Final   Influenza B by PCR NEGATIVE NEGATIVE Final    Comment: (NOTE) The Xpert Xpress SARS-CoV-2/FLU/RSV plus assay is intended as an aid in the diagnosis of influenza from Nasopharyngeal swab specimens and should not be used as a sole basis for treatment. Nasal washings and aspirates are unacceptable for Xpert Xpress SARS-CoV-2/FLU/RSV testing.  Fact Sheet for Patients: EntrepreneurPulse.com.au  Fact Sheet for Healthcare Providers: IncredibleEmployment.be  This test is not yet approved or cleared by the [place] FDA and has been authorized for detection and/or diagnosis of SARS-CoV-2 by FDA under an Emergency Use Authorization (EUA). This EUA will remain in effect (meaning this test can be used) for the duration of the COVID-19 declaration under Section 564(b)(1) of the Act, 21 U.S.C. section 360bbb-3(b)(1), unless the authorization is terminated or revoked.  Performed at [facility], [place] [address] [place], [place] [address]   Blood culture (routine x 2)     Status: None (Preliminary result)   Collection Time: 09/15/20  3:43 PM   Specimen: BLOOD  Result Value Ref Range Status   Specimen Description   Final    BLOOD LEFT ANTECUBITAL Performed at [facility] [address], [place], [place] [address]    Special Requests   Final    BOTTLES DRAWN AEROBIC AND ANAEROBIC Blood Culture adequate volume Performed at [facility] [address], [place], [place] [address]    Culture   Final    NO GROWTH 4 DAYS Performed at [facility], [place] [address], [place], [place] [address]    Report Status PENDING  Incomplete  Blood culture (routine x 2)     Status: None (Preliminary result)    Collection Time: 09/15/20  3:48 PM   Specimen: BLOOD  Result Value Ref Range Status   Specimen Description   Final    BLOOD RIGHT ANTECUBITAL Performed at [facility] [address] [place], [place] [address]    Special Requests   Final    BOTTLES DRAWN AEROBIC AND ANAEROBIC Blood Culture adequate volume Performed at [facility] [address] [place], [place] [address]    Culture   Final    NO GROWTH 4 DAYS Performed at [facility]  Lab, [address], [place], [place] [address]    Report Status PENDING  Incomplete  Body fluid culture w Gram Stain     Status: None (Preliminary result)   Collection Time: 09/16/20  9:22 AM   Specimen: Pleura  Result Value Ref Range Status   Specimen Description   Final    PLEURAL RIGHT Performed at [facility] [address] [place], [place] [address]    Special Requests   Final    NONE Performed at [facility], [place] [address], [place], [place] [address]    Gram Stain   Final    FEW WBC PRESENT, PREDOMINANTLY MONONUCLEAR NO ORGANISMS SEEN    Culture   Final    NO GROWTH 3 DAYS Performed at [facility] [address], [place], [place] [address]    Report Status PENDING  Incomplete         Radiology Studies: No results found.      Scheduled Meds: . fluticasone  2 spray Each Nare Daily  . furosemide  40 mg Oral Daily  . ipratropium-albuterol  3 mL Nebulization BID  . metoprolol tartrate  25 mg Oral BID  . simvastatin  20 mg Oral q1800  . tamsulosin  0.4 mg Oral Daily   Continuous Infusions: . heparin 1,400 Units/hr (09/20/20 0353)     LOS: 5 days    Time spent: 35 mins.More than 50% of that time was spent in counseling and/or coordination of care.      Shelly Coss, MD [facility] P5/08/2020, 7:51 AM l;l

## 2020-09-21 ENCOUNTER — Encounter (HOSPITAL_COMMUNITY): Payer: Self-pay | Admitting: Internal Medicine

## 2020-09-21 ENCOUNTER — Inpatient Hospital Stay (HOSPITAL_COMMUNITY): Payer: Medicare Other

## 2020-09-21 DIAGNOSIS — I350 Nonrheumatic aortic (valve) stenosis: Secondary | ICD-10-CM | POA: Diagnosis not present

## 2020-09-21 DIAGNOSIS — R0602 Shortness of breath: Secondary | ICD-10-CM | POA: Diagnosis not present

## 2020-09-21 DIAGNOSIS — I10 Essential (primary) hypertension: Secondary | ICD-10-CM | POA: Diagnosis not present

## 2020-09-21 DIAGNOSIS — I4891 Unspecified atrial fibrillation: Secondary | ICD-10-CM | POA: Diagnosis not present

## 2020-09-21 LAB — CBC WITH DIFFERENTIAL/PLATELET
Abs Immature Granulocytes: 0.02 10*3/uL (ref 0.00–0.07)
Basophils Absolute: 0 10*3/uL (ref 0.0–0.1)
Basophils Relative: 1 %
Eosinophils Absolute: 0.2 10*3/uL (ref 0.0–0.5)
Eosinophils Relative: 3 %
HCT: 36 % — ABNORMAL LOW (ref 39.0–52.0)
Hemoglobin: 11 g/dL — ABNORMAL LOW (ref 13.0–17.0)
Immature Granulocytes: 0 %
Lymphocytes Relative: 12 %
Lymphs Abs: 0.6 10*3/uL — ABNORMAL LOW (ref 0.7–4.0)
MCH: 25.5 pg — ABNORMAL LOW (ref 26.0–34.0)
MCHC: 30.6 g/dL (ref 30.0–36.0)
MCV: 83.3 fL (ref 80.0–100.0)
Monocytes Absolute: 0.7 10*3/uL (ref 0.1–1.0)
Monocytes Relative: 14 %
Neutro Abs: 3.5 10*3/uL (ref 1.7–7.7)
Neutrophils Relative %: 70 %
Platelets: 188 10*3/uL (ref 150–400)
RBC: 4.32 MIL/uL (ref 4.22–5.81)
RDW: 16 % — ABNORMAL HIGH (ref 11.5–15.5)
WBC: 5.1 10*3/uL (ref 4.0–10.5)
nRBC: 0 % (ref 0.0–0.2)

## 2020-09-21 LAB — HEPARIN LEVEL (UNFRACTIONATED): Heparin Unfractionated: 0.51 IU/mL (ref 0.30–0.70)

## 2020-09-21 LAB — GLUCOSE, CAPILLARY
Glucose-Capillary: 99 mg/dL (ref 70–99)
Glucose-Capillary: 93 mg/dL (ref 70–99)
Glucose-Capillary: 89 mg/dL (ref 70–99)
Glucose-Capillary: 177 mg/dL — ABNORMAL HIGH (ref 70–99)

## 2020-09-21 LAB — BASIC METABOLIC PANEL
Anion gap: 12 (ref 5–15)
BUN: 27 mg/dL — ABNORMAL HIGH (ref 8–23)
CO2: 26 mmol/L (ref 22–32)
Calcium: 8.7 mg/dL — ABNORMAL LOW (ref 8.9–10.3)
Chloride: 101 mmol/L (ref 98–111)
Creatinine, Ser: 0.82 mg/dL (ref 0.61–1.24)
GFR, Estimated: >60 mL/min (ref >60)
Glucose, Bld: 104 mg/dL — ABNORMAL HIGH (ref 70–99)
Potassium: 3.7 mmol/L (ref 3.5–5.1)
Sodium: 139 mmol/L (ref 135–145)

## 2020-09-21 MED ORDER — HEPARIN (PORCINE) 25000 UT/250ML-% IV SOLN
1500.0000 [IU]/h | INTRAVENOUS | Status: AC
  Administered 2020-09-21: 1350 [IU]/h via INTRAVENOUS
  Filled 2020-09-21: qty 250

## 2020-09-21 MED ORDER — MIDAZOLAM HCL 2 MG/2ML IJ SOLN
INTRAMUSCULAR | Status: AC
  Filled 2020-09-21: qty 4

## 2020-09-21 MED ORDER — LIDOCAINE HCL (PF) 1 % IJ SOLN
INTRAMUSCULAR | Status: AC | PRN
  Administered 2020-09-21: 10 mL via INTRADERMAL

## 2020-09-21 MED ORDER — MIDAZOLAM HCL 2 MG/2ML IJ SOLN
INTRAMUSCULAR | Status: AC | PRN
  Administered 2020-09-21: 1 mg via INTRAVENOUS

## 2020-09-21 MED ORDER — FENTANYL CITRATE (PF) 100 MCG/2ML IJ SOLN
INTRAMUSCULAR | Status: AC
  Filled 2020-09-21: qty 2

## 2020-09-21 MED ORDER — CEFAZOLIN SODIUM-DEXTROSE 2-4 GM/100ML-% IV SOLN
INTRAVENOUS | Status: AC
  Filled 2020-09-21: qty 100

## 2020-09-21 NOTE — H&P (Signed)
Chief Complaint: Patient was seen in consultation today for image guided right chest tube placement Chief Complaint  Patient presents with  . Shortness of Breath   at the request of Dr. Chase Caller, Jerilynn Mages.   Referring Physician(s): Dr. Chase Caller, Jerilynn Mages.   Supervising Physician: Arne Cleveland  Patient Status: [facility] - In-pt  History of Present Illness: Adam Briggs is a 87 y.o. male with PMH of HTN, dyslipidemia, aortic stenosis, likely undiagnosed COPD who presented to [place] ER on 09/15/2020 with dyspnea.  Patient was admitted for acute hypoxic respiratory failure.  CXR in the ED showed large right loculated pleural effusion and moderate left pleural effusion, patient underwent therapeutic and diagnostic thoracentesis in ED, yielded 900 cc of serosanguineous fluid.   IR was requested for a right chest tube placement due to residual loculated pleural effusion seen on post thoracentesis CXR on 09/15/2020.  Case was reviewed by Dr. Laurence Ferrari who recommended a CT chest with contrast to evaluate the amount of residual fluid.  Patient underwent CT scan on 09/20/20 which was reviewed by Dr. Vernard Gambles, and a CT-guided right chest tube placement was approved.  Patient laying in bed, not in acute distress. Patient's daughter at the bedside.  Denise headache, fever, chills, shortness of breath, cough, chest pain, abdominal pain, nausea ,vomiting.  Daughter states that patient had some blood in his stool today, but thinks that it was from hemorrhoid.  Past Medical History:  Diagnosis Date  . BENIGN PROSTATIC HYPERTROPHY   . Cataract   . DIVERTICULOSIS, COLON   . DM   . GERD   . Heart murmur   . Hernia   . HYPERCHOLESTEROLEMIA   . HYPERTENSION   . Moderate aortic stenosis 10/30/2011   echo 2013  . Prostate cancer [facility])    s/p seed implant    Past Surgical History:  Procedure Laterality Date  . CATARACT EXTRACTION W/ INTRAOCULAR LENS IMPLANT  02/2013   R, then L 4 weeks later - [place]  .  INGUINAL HERNIA REPAIR     Right  . INGUINAL HERNIA REPAIR Left 07/15/2014   Procedure: LEFT INGUINAL HERNIA REPAIR WITH MESH;  Surgeon: Armandina Gemma, MD;  Location: [facility] ORS;  Service: General;  Laterality: Left;  . INSERTION OF MESH Left 07/15/2014   Procedure: INSERTION OF MESH;  Surgeon: Armandina Gemma, MD;  Location: [facility];  Service: General;  Laterality: Left;  . RADIOACTIVE SEED IMPLANT      Allergies: Lisinopril  Medications: Prior to Admission medications   Medication Sig Start Date End Date Taking? Authorizing Provider  albuterol (VENTOLIN HFA) 108 (90 Base) MCG/ACT inhaler INHALE 1 TO 2 PUFFS BY MOUTH EVERY 6 HOURS AS NEEDED FOR WHEEZING FOR SHORTNESS OF BREATH Patient taking differently: Inhale 2 puffs into the lungs every 6 (six) hours as needed for wheezing or shortness of breath. 09/15/20  Yes [name], MD  amLODipine (NORVASC) 5 MG tablet Take 1 tablet (5 mg total) by mouth daily. 11/26/19  Yes [name], MD  aspirin EC 81 MG tablet Take 81 mg by mouth daily.   Yes [provider]  fluticasone (FLONASE) 50 MCG/ACT nasal spray Place 2 sprays into both nostrils daily. 08/04/18  Yes [name], MD  losartan-hydrochlorothiazide (HYZAAR) 100-25 MG tablet Take 1 tablet by mouth daily. 11/26/19  Yes [name], MD  Multiple Vitamin (MULTIVITAMIN WITH MINERALS) TABS tablet Take 1 tablet by mouth daily.   Yes [provider]  simvastatin (ZOCOR) 20 MG tablet  Take 1 tablet (20 mg total) by mouth daily at 6 PM. 11/26/19  Yes [name], MD  tamsulosin (FLOMAX) 0.4 MG CAPS capsule Take 0.4 mg by mouth daily. 07/21/20  Yes [provider]     Family History  Problem Relation Age of Onset  . Heart attack Paternal Uncle        in his 24s  . Atrial fibrillation Daughter   . Colon cancer Neg Hx     Social History   Socioeconomic History  . Marital status: Widowed    Spouse name: Not on file  . Number of  children: 2  . Years of education: Not on file  . Highest education level: Not on file  Occupational History  . Occupation: Retired     Comment: Retired  Immunologist  . Smoking status: Former Smoker    Packs/day: 0.50    Years: 35.00    Pack years: 17.50    Types: Cigarettes    Quit date: 10/30/1966    Years since quitting: 53.9  . Smokeless tobacco: Never Used  Substance and Sexual Activity  . Alcohol use: No  . Drug use: No  . Sexual activity: Not on file  Other Topics Concern  . Not on file  Social History Narrative   Pt says his diet is excellent   Daily caffeine    Social Determinants of Health   Financial Resource Strain: Not on file  Food Insecurity: Not on file  Transportation Needs: Not on file  Physical Activity: Not on file  Stress: Not on file  Social Connections: Not on file     Review of Systems: A 12 point ROS discussed and pertinent positives are indicated in the HPI above.  All other systems are negative.   Vital Signs: BP 120/81 (BP Location: Left Arm)   Pulse 80   Temp 97.9 F (36.6 C) (Oral)   Resp 18   Wt 157 lb 3 oz (71.3 kg)   SpO2 95%   BMI 22.55 kg/m   Physical Exam  Vitals and nursing note reviewed.  Constitutional:      General: He is not in acute distress.    Appearance: Normal appearance.  HENT:     Head: Normocephalic and atraumatic.     Mouth/Throat:     Mouth: Mucous membranes are moist.     Pharynx: Oropharynx is clear.  Cardiovascular:     Rate and Rhythm: Normal rate and regular rhythm.     Pulses: Normal pulses.     Heart sounds: positive for murmur at AV, TV Pulmonary:     Effort: Pulmonary effort is normal.     Breath sounds: Normal breath sounds. No wheezing, rhonchi or rales.  Abdominal:     General: Bowel sounds are normal. There is no distension.     Palpations: Abdomen is soft.  Skin:    General: Skin is warm and dry.  Neurological:     Mental Status: He is alert and oriented to person, place, and  time.  Psychiatric:        Mood and Affect: Mood normal.        Behavior: Behavior normal.    Imaging: DG Chest 2 View  Result Date: 09/15/2020 CLINICAL DATA:  Shortness of breath. EXAM: CHEST - 2 VIEW COMPARISON:  January 16, 2012. FINDINGS: The heart size and mediastinal contours are within normal limits. No pneumothorax is noted. Large loculated right pleural effusion is noted with associated right basilar atelectasis or  infiltrate. Minimal left basilar subsegmental atelectasis is noted with probable small left pleural effusion. The visualized skeletal structures are unremarkable. IMPRESSION: Large loculated right pleural effusion is noted with associated right basilar atelectasis or infiltrate. Electronically Signed   By: Marijo Conception M.D.   On: 09/15/2020 15:52   CT CHEST WO CONTRAST  Result Date: 09/20/2020 CLINICAL DATA:  Pleural effusion, dyspnea EXAM: CT CHEST WITHOUT CONTRAST TECHNIQUE: Multidetector CT imaging of the chest was performed following the standard protocol without IV contrast. COMPARISON:  01/15/2021 FINDINGS: Cardiovascular: Extensive multi-vessel coronary artery calcification is again noted. There is extensive calcification of the aortic valve leaflets. Global cardiac size is within normal limits. No pericardial effusion. The central pulmonary arteries are enlarged in keeping with changes of pulmonary arterial hypertension. There is moderate atherosclerotic calcification within the thoracic aorta. No aortic aneurysm. Mediastinum/Nodes: There is mild mediastinal shift to the right related to right-sided volume loss again noted. Shotty mediastinal adenopathy is stable, possibly reactive in nature. No frankly pathologic thoracic adenopathy identified. The esophagus is unremarkable. The visualized thyroid is unremarkable. Lungs/Pleura: Moderate to large right complex laterally loculated pleural effusion is unchanged in size and position extending from the apex to the right lung  base and circumferentially constricting of the right lung. There is pleural thickening identified as well as heterogeneity of the a contents of the pleural effusion suggesting proteinaceous or hemorrhagic material. There is stable subtotal collapse and consolidation of the right lung with a pleural rind again identified. Moderate dependently layering left pleural effusion with compressive atelectasis of the left lower lobe is unchanged. Left apical subpleural paramediastinal nodule is unchanged measuring 2.3 x 1.6 cm at axial image # 34/5. No pneumothorax.  No central obstructing lesion. Upper Abdomen: No acute abnormality. Previously noted subtle hepatic abnormalities are not as well appreciated on this examination in absence of contrast administration. Musculoskeletal: No lytic or blastic bone lesion identified. IMPRESSION: Stable examination with moderate to large laterally loculated complex pleural effusion demonstrating heterogeneous internal attenuation suggesting proteinaceous or hemorrhagic material and associated pleural thickening. Stable subtotal collapse and consolidation of the right lung with volume loss and mediastinal shift to the right. Moderate left dependently layering pleural effusion. Indeterminate 2.3 cm soft tissue nodule within the left apex suspicious for a primary bronchogenic neoplasm. PET CT examination may be helpful for further evaluation. Extensive multi-vessel coronary artery calcification. Extensive calcification of the aortic valve leaflets. Morphologic changes in keeping with pulmonary arterial hypertension. Aortic Atherosclerosis (ICD10-I70.0). Electronically Signed   By: Fidela Salisbury MD   On: 09/20/2020 20:30   CT Chest W Contrast  Result Date: 09/15/2020 CLINICAL DATA:  Chest x-ray showing pleural effusion. EXAM: CT CHEST WITH CONTRAST TECHNIQUE: Multidetector CT imaging of the chest was performed during intravenous contrast administration. CONTRAST:  67mL OMNIPAQUE  IOHEXOL 300 MG/ML  SOLN COMPARISON:  Chest x-ray from earlier same day. FINDINGS: Cardiovascular: Aortic atherosclerosis. No thoracic aortic aneurysm. No pericardial effusion. Scattered coronary artery calcifications. Mediastinum/Nodes: Numerous small lymph nodes within the mediastinum, majority with short axis measurements of less than 1 cm. Largest lymph node is seen within the upper LEFT paratracheal region with a short axis measurement of 1.3 cm (series 2, image 32). Esophagus is unremarkable.  Trachea is unremarkable. Lungs/Pleura: Loculated RIGHT pleural effusion, moderate to large in size. Eccentric irregular pleural thickening at the RIGHT lateral lung base, at the periphery of the loculated pleural effusion (series 2, image 106), suspicious for neoplastic process. Additional masslike consolidations within the RIGHT lower lung,  at the medial aspects of the loculated pleural effusion, most likely rib associated rounded atelectasis but some component may represent neoplastic mass. Irregular mass within the LEFT upper lobe, medial aspect, measuring 2 cm greatest dimension, almost certainly neoplastic. This abuts the LEFT mediastinal margin and there may be slight extension of this mass into the mediastinum. Additional layering LEFT pleural effusion, moderate in size. Upper Abdomen: Small low-density foci within the RIGHT liver lobe (series 2, images 150 and 151) w, suspicious for metastases. Splenomegaly. Musculoskeletal: No acute or suspicious osseous finding. IMPRESSION: 1. Loculated RIGHT pleural effusion, moderate to large in size. Eccentric irregular pleural thickening at the RIGHT lateral lung base, located at the periphery of this loculated pleural effusion, highly suspicious for neoplastic process at the pleural surface (primary or metastatic) with associated malignant effusion. 2. Additional larger masslike consolidations within the RIGHT lower lung, at the medial aspects of the loculated pleural  effusion, most likely rounded atelectasis but some component may represent neoplastic mass. 3. Highly suspicious irregular mass within the LEFT upper lobe, medial aspect, measuring 2 cm greatest dimension, almost certainly neoplastic. This abuts the LEFT mediastinal margin and may extend into the mediastinum 4. Additional layering LEFT pleural effusion, moderate in size, also suspicious for malignant effusion. 5. Small low-density foci within the RIGHT liver lobe, suspicious for hepatic metastases. 6. Splenomegaly. Aortic Atherosclerosis (ICD10-I70.0). Electronically Signed   By: Franki Cabot M.D.   On: 09/15/2020 18:32   DG CHEST PORT 1 VIEW  Result Date: 09/20/2020 CLINICAL DATA:  Pleural effusion EXAM: PORTABLE CHEST 1 VIEW COMPARISON:  09/16/2020, CT chest 09/15/2020 FINDINGS: Moderate loculated right pleural effusion and pleural disease without great change. Possible tiny loculated pneumothorax at the right apex also unchanged. Left upper lung lung mass not well demonstrated on radiography. Stable cardiomediastinal silhouette. Small left effusion IMPRESSION: 1. Grossly stable moderate to large loculated right pleural effusion and pleural disease with possible trace loculated pneumothorax at the right apex 2. Similar airspace disease at the right lung base. Probable small left effusion Electronically Signed   By: Donavan Foil M.D.   On: 09/20/2020 18:51   DG Chest Port 1 View  Result Date: 09/16/2020 CLINICAL DATA:  Status post RIGHT thoracentesis for loculated RIGHT pleural effusion. EXAM: PORTABLE CHEST 1 VIEW COMPARISON:  09/15/2020 chest radiograph, CT and prior studies FINDINGS: Loculated RIGHT pleural effusion has decreased in size. A tiny amount of air within the loculated SUPERIOR RIGHT pleural space is noted. Nodularity of the LOWER RIGHT lung/pleura again noted. RIGHT hemithorax volume loss is again noted as well as LEFT pleural effusion. IMPRESSION: Decreased loculated RIGHT pleural effusion  with tiny amount of air within the loculated SUPERIOR pleural space. No other significant changes. Electronically Signed   By: Margarette Canada M.D.   On: 09/16/2020 10:00   ECHOCARDIOGRAM COMPLETE  Result Date: 09/20/2020    ECHOCARDIOGRAM REPORT   Patient Name:   ARTAVIOUS TREBILCOCK Date of Exam: 09/20/2020 Medical Rec #:  [id number]     Height:       70.0 in Accession #:    [id number]    Weight:       157.2 lb Date of Birth:  03-13-1930     BSA:          1.884 m Patient Age:    21 years      BP:           102/67 mmHg Patient Gender: M  HR:           80 bpm. Exam Location:  Inpatient Procedure: 2D Echo Indications:    Atrial Fibrillation  History:        Patient has prior history of Echocardiogram examinations, most                 recent 09/16/2020. Signs/Symptoms:Murmur; Risk                 Factors:Hypertension and Diabetes.  Sonographer:    Mikki Santee RDCS (AE) Referring Phys: [id number] Darreld Mclean  Sonographer Comments: Technically difficult study due to poor echo windows, suboptimal apical window and suboptimal subcostal window. IMPRESSIONS  1. As with prior echo 09/16/20 poor quality images LV has abnormal septal motion unable to assess RWMA;s consider TEE if clinically indicated to also evaluate severity of AS. Left ventricular ejection fraction, by estimation, is ? 50-55%%. The left ventricle has mildly decreased function. Left ventricular endocardial border not optimally defined to evaluate regional wall motion. There is mild left ventricular hypertrophy. Left ventricular diastolic parameters are indeterminate.  2. Right ventricular systolic function was not well visualized. The right ventricular size is not well visualized.  3. The mitral valve was not well visualized. Trivial mitral valve regurgitation.  4. Poorly visualized some calcium seen mean gradient 19 peak 37 mmHg likely moderate AS. Consider TEE if clinically indicated . The aortic valve was not well visualized. Aortic valve  regurgitation is not visualized. Moderate aortic valve stenosis.  5. Pulmonic valve regurgitation poorly seen. FINDINGS  Left Ventricle: As with prior echo 09/16/20 poor quality images LV has abnormal septal motion unable to assess RWMA;s consider TEE if clinically indicated to also evaluate severity of AS. Left ventricular ejection fraction, by estimation, is ? 50-55%%. The left ventricle has mildly decreased function. Left ventricular endocardial border not optimally defined to evaluate regional wall motion. The left ventricular internal cavity size was normal in size. There is mild left ventricular hypertrophy. Left ventricular diastolic parameters are indeterminate. Right Ventricle: The right ventricular size is not well visualized. Right vetricular wall thickness was not well visualized. Right ventricular systolic function was not well visualized. Left Atrium: Left atrial size was normal in size. Right Atrium: Right atrial size was not well visualized. Pericardium: There is no evidence of pericardial effusion. Mitral Valve: The mitral valve was not well visualized. Trivial mitral valve regurgitation. Tricuspid Valve: The tricuspid valve is normal in structure. Tricuspid valve regurgitation is mild. Aortic Valve: Poorly visualized some calcium seen mean gradient 19 peak 37 mmHg likely moderate AS. Consider TEE if clinically indicated. The aortic valve was not well visualized. Aortic valve regurgitation is not visualized. Moderate aortic stenosis is present. Aortic valve mean gradient measures 19.0 mmHg. Aortic valve peak gradient measures 35.6 mmHg. Aortic valve area, by VTI measures 0.73 cm. Pulmonic Valve: The pulmonic valve was not well visualized. Pulmonic valve regurgitation poorly seen. Aorta: The aortic root was not well visualized. IAS/Shunts: The interatrial septum was not well visualized.  LEFT VENTRICLE PLAX 2D LVIDd:         4.17 cm  Diastology LVIDs:         2.59 cm  LV e' medial:    9.36 cm/s LV  PW:         1.23 cm  LV E/e' medial:  1.0 LV IVS:        0.91 cm  LV e' lateral:   9.68 cm/s LVOT diam:     2.20 cm  LV E/e' lateral: 1.0 LV SV:         43 LV SV Index:   23 LVOT Area:     3.80 cm  LEFT ATRIUM           Index       RIGHT ATRIUM           Index LA diam:      3.10 cm 1.65 cm/m  RA Area:     20.90 cm LA Vol (A2C): 69.7 ml 36.99 ml/m RA Volume:   61.60 ml  32.69 ml/m  AORTIC VALVE AV Area (Vmax):    0.71 cm AV Area (Vmean):   0.73 cm AV Area (VTI):     0.73 cm AV Vmax:           298.50 cm/s AV Vmean:          199.500 cm/s AV VTI:            0.588 m AV Peak Grad:      35.6 mmHg AV Mean Grad:      19.0 mmHg LVOT Vmax:         55.40 cm/s LVOT Vmean:        38.500 cm/s LVOT VTI:          0.113 m LVOT/AV VTI ratio: 0.19  AORTA Ao Root diam: 2.80 cm MV E velocity: 9.36 cm/s                           SHUNTS                           Systemic VTI:  0.11 m                           Systemic Diam: 2.20 cm Jenkins Rouge MD Electronically signed by Jenkins Rouge MD Signature Date/Time: 09/20/2020/12:05:21 PM    Final    ECHOCARDIOGRAM COMPLETE  Result Date: 09/16/2020    ECHOCARDIOGRAM REPORT   Patient Name:   KYLEY LAUREL Date of Exam: 09/16/2020 Medical Rec #:  [id number]     Height:       70.0 in Accession #:    [id number]    Weight:       170.0 lb Date of Birth:  10/26/1929     BSA:          1.948 m Patient Age:    77 years      BP:           122/82 mmHg Patient Gender: M             HR:           102 bpm. Exam Location:  Inpatient Procedure: 2D Echo, Cardiac Doppler and Color Doppler Indications:    I35.0 Nonrheumatic aortic (valve) stenosis  History:        Patient has prior history of Echocardiogram examinations, most                 recent 11/05/2012. Signs/Symptoms:Murmur; Risk                 Factors:Hypertension, Diabetes, Dyslipidemia and GERD. Cancer.  Sonographer:    Jonelle Sidle Dance Referring Phys: [id number] [place]  Sonographer Comments: Technically difficult study due to poor echo  windows, no parasternal window, no subcostal window, suboptimal apical window and no apical window. IMPRESSIONS  1. LV not visualized.  2. RV not visualized.  3. Moderate pleural effusion in the right lateral region.  4. The aortic valve was not well visualized. Moderate aortic valve stenosis. Aortic valve mean gradient measures 26.0 mmHg. Aortic valve Vmax measures 3.47 m/s. FINDINGS  Left Ventricle: LV not visualized. Right Ventricle: RV not visualized. Aortic Valve: The aortic valve was not well visualized. Moderate aortic stenosis is present. Aortic valve mean gradient measures 26.0 mmHg. Aortic valve peak gradient measures 48.2 mmHg. Additional Comments: There is a moderate pleural effusion in the right lateral region.  AORTIC VALVE AV Vmax:      347.00 cm/s AV Vmean:     238.000 cm/s AV VTI:       0.603 m AV Peak Grad: 48.2 mmHg AV Mean Grad: 26.0 mmHg Candee Furbish MD Electronically signed by Candee Furbish MD Signature Date/Time: 09/16/2020/2:29:47 PM    Final     Labs:  CBC: Recent Labs    09/15/20 2047 09/16/20 0359 09/18/20 1032 09/20/20 1236 09/21/20 0440  WBC 5.9 5.2 5.0  --  5.1  HGB 12.1* 12.7* 12.0* 11.6* 11.0*  HCT 39.3 40.1 39.2 38.3* 36.0*  PLT 214 190 215  --  188    COAGS: Recent Labs    09/15/20 2047  INR 1.1    BMP: Recent Labs    09/15/20 1550 09/16/20 0359 09/18/20 1032 09/21/20 0440  NA 138 138 136 139  K 3.7 3.8 3.7 3.7  CL 100 99 96* 101  CO2 30 32 32 26  GLUCOSE 128* 126* 122* 104*  BUN 26* 21 31* 27*  CALCIUM 9.3 8.8* 8.8* 8.7*  CREATININE 0.95 0.90 1.11 0.82  GFRNONAA >60 >60 >60 >60    LIVER FUNCTION TESTS: Recent Labs    11/26/19 1549 09/15/20 1132 09/15/20 1550 09/15/20 2047 09/16/20 0359  BILITOT 0.5 0.6 0.5  --  0.5  AST 12 11 15   --  16  ALT 9 12 15   --  13  ALKPHOS  --  59 62  --  55  PROT 6.3 6.7 7.1 6.6 6.1*  ALBUMIN  --  3.9 3.9 3.6 3.3*    TUMOR MARKERS: No results for input(s): AFPTM, CEA, CA199, CHROMGRNA in the last  8760 hours.  Assessment and Plan: 87 y.o. male with loculated right pleural effusion, residual pleural effusion seen on  post thoracentesis CXR.   Request was made for right chest tube placement.  Case was reviewed and approved by Dr. Vernard Gambles.  N.p.o. since midnight Heparin held at 10 am.  INR 1.1 on 09/15/2020 Platelet 188  Risks and benefits of chest tube placement were discussed with the patient including bleeding, infection, damage to adjacent structures, malfunction of the tube requiring additional procedures and sepsis.  All of the patient's questions were answered, patient is agreeable to proceed. Consent signed and in chart.   Thank you for this interesting consult.  I greatly enjoyed meeting Adam Briggs and look forward to participating in their care.  A copy of this report was sent to the requesting provider on this date.  Electronically Signed: Tera Mater, PA-C 09/21/2020, 11:40 AM   I spent a total of 20 Minutes   in face to face in clinical consultation, greater than 50% of which was counseling/coordinating care for right chest tube placement

## 2020-09-21 NOTE — Progress Notes (Signed)
   PCCM Interval note    Chart reviewed.  Remains hemodynamically stable on 2L [place], afebrile S/p CT guided R chest tube today by IR.  Plans for CXR in am Will see 5/6 and consider intrapleural lytics for right loculated effusion       Kennieth Rad, ACNP [facility] 09/21/2020, 6:10 PM

## 2020-09-21 NOTE — Progress Notes (Signed)
PROGRESS NOTE    Adam Briggs  QQV:[id number] DOB: Jun 26, 1929 DOA: 09/15/2020 PCP: [name], MD   Chief Complain: Dyspnea  Brief Narrative: Patient is a 87 year old male with a past medical history of hypertension, dyslipidemia, aortic stenosis, who presents to the emergency department on 4/22 with complaints of dyspnea, generalized weakness.  He was seen at his cardiology office where he was found to have pleural effusion, cardiomegaly and he was recommended to go to the emergency department.  On presentation he was tachycardic but saturating fine on room air.  X-ray showed large loculated right pleural effusion.  CT chest confirmed loculated right pleural effusion with irregular pleural thickening suspicious for neoplastic process, moderate left pleural effusion.  PCCM consulted and he underwent thoracentesis.  Pleural cytology was nonconclusive, did not show any malignant cells.  Plan for right pigtail chest tube placement by IR today.  Hospital course remarkable for persistent new onset A. fib with RVR, cardiology were consulted.  Assessment & Plan:   Active Problems:   Essential hypertension   Moderate aortic stenosis   SOB (shortness of breath)   Pleural effusion, right   Acute respiratory failure with hypoxia (HCC)   Mass of upper lobe of left lung   Atrial fibrillation, new onset (HCC)   Atrial fibrillation (HCC)   S/P thoracentesis   Acute hypoxic respiratory failure: Secondary to bilateral pleural effusion.  Currently on room air.  Continue supplemental oxygen as needed.  Bilateral pleural effusion: X-ray showed large loculated right pleural effusion.  CT chest confirmed loculated right pleural effusion with irregular pleural thickening suspicious for neoplastic process, moderate left pleural effusion.  PCCM consulted and he underwent thoracentesis.  Pleural fluid cytology report inconclusive/negative for malignant cells. CT chest done on 09/20/2020 showed stable  moderate to large laterally loculated complex pleural effusion demonstrating heterogeneous internal attenuation suggesting proteinaceous or hemorrhagic material andassociated pleural thickening.Stable subtotal collapse and consolidation of the right lung with volume loss and mediastinal shift to the right.Also showed possible left moderate pleural effusion. Plan for right pigtail chest tube placement by IR today. On Lasix daily.  Left upper lobe lung mass/right lung lesion:Follow CT has again shown ndeterminate 2.3 cm soft tissue nodule within the left apex suspicious for a primary bronchogenic neoplasm. Plan for PET CT as an outpatient.PCCM following.  New onset A. fib: On presentation.  Monitor on telemetry.  Requested  cardiology evaluation for decision on starting anticoagulation on this elderly male with multiple problems. CHA2DS2VASc score of 3.  Started on heparin drip with plan to start on Eliquis eventually.  Also started on Lopressor for rate control. TSH normal.  Currently rate is controlled.  Aortic stenosis: Diagnosed with aortic stenosis but has not followed up last 7 years.  Echo showed moderate aortic stenosis  Hypertension: Continue home Norvasc,metoprolol.  Home losartan/hydrochlorothiazide was held.  Currently blood pressure stable.  Hyperlipidemia: Continue statin         DVT prophylaxis:SCD Code Status: Full Family Communication: daughter at bedside on 09/21/20 Status is: Inpatient  Remains inpatient appropriate because:Inpatient level of care appropriate due to severity of illness   Dispo: The patient is from: Home              Anticipated d/c is to: Home              Patient currently is not medically stable to d/c.   Difficult to place patient No     Consultants: PCCM  Procedures:Thoracentesisdfgd  Antimicrobials:  Anti-infectives (From  admission, onward)   None      Subjective:  Patient seen and examined the bedside this morning. Hemodynamically   stable.  Sitting on the chair.  Saturating fine on room air.  Daughter at the bedside.  Plan for chest tube placement  Objective: Vitals:   09/20/20 2017 09/20/20 2055 09/21/20 0603 09/21/20 0751  BP:  104/62 120/81   Pulse:  67 80   Resp:  18 18   Temp:  98.3 F (36.8 C) 97.9 F (36.6 C)   TempSrc:  Oral Oral   SpO2: 94% 95% 96% 95%  Weight:        Intake/Output Summary (Last 24 hours) at 09/21/2020 0808 Last data filed at 09/21/2020 0318 Gross per 24 hour  Intake 638.95 ml  Output 800 ml  Net -161.05 ml   Filed Weights   09/18/20 0500 09/19/20 0500 09/20/20 0542  Weight: 72.9 kg 73.3 kg 71.3 kg    Examination:  General exam: Overall comfortable, not in distress HEENT: PERRL Respiratory system: Diminished air sounds bilaterally but more on the right .no wheezes or crackles  Cardiovascular system: S1 & S2 heard, RRR.  Gastrointestinal system: Abdomen is nondistended, soft and nontender. Central nervous system: Alert and oriented Extremities: No edema, no clubbing ,no cyanosis Skin: No rashes, no ulcers,no icterus     Data Reviewed: I have personally reviewed following labs and imaging studies  CBC: Recent Labs  Lab 09/15/20 1550 09/15/20 2047 09/16/20 0359 09/18/20 1032 09/20/20 1236 09/21/20 0440  WBC 7.4 5.9 5.2 5.0  --  5.1  NEUTROABS 5.8  --   --  3.9  --  3.5  HGB 12.5* 12.1* 12.7* 12.0* 11.6* 11.0*  HCT 40.7 39.3 40.1 39.2 38.3* 36.0*  MCV 83.9 84.2 82.2 82.7  --  83.3  PLT 230 214 190 215  --  287   Basic Metabolic Panel: Recent Labs  Lab 09/15/20 1132 09/15/20 1550 09/16/20 0359 09/18/20 1032 09/21/20 0440  NA 139 138 138 136 139  K 3.9 3.7 3.8 3.7 3.7  CL 100 100 99 96* 101  CO2 31 30 32 32 26  GLUCOSE 104* 128* 126* 122* 104*  BUN 26* 26* 21 31* 27*  CREATININE 0.98 0.95 0.90 1.11 0.82  CALCIUM 9.4 9.3 8.8* 8.8* 8.7*  MG  --   --  2.0  --   --   PHOS  --   --  3.7  --   --    GFR: Estimated Creatinine Clearance: 61.6 mL/min (by C-G  formula based on SCr of 0.82 mg/dL). Liver Function Tests: Recent Labs  Lab 09/15/20 1132 09/15/20 1550 09/15/20 2047 09/16/20 0359  AST 11 15  --  16  ALT 12 15  --  13  ALKPHOS 59 62  --  55  BILITOT 0.6 0.5  --  0.5  PROT 6.7 7.1 6.6 6.1*  ALBUMIN 3.9 3.9 3.6 3.3*   No results for input(s): LIPASE, AMYLASE in the last 168 hours. No results for input(s): AMMONIA in the last 168 hours. Coagulation Profile: Recent Labs  Lab 09/15/20 2047  INR 1.1   Cardiac Enzymes: No results for input(s): CKTOTAL, CKMB, CKMBINDEX, TROPONINI in the last 168 hours. BNP (last 3 results) Recent Labs    09/15/20 1132  PROBNP 238.0*   HbA1C: No results for input(s): HGBA1C in the last 72 hours. CBG: Recent Labs  Lab 09/19/20 2040 09/20/20 0714 09/20/20 1213 09/20/20 2052 09/21/20 0729  GLUCAP 97 87 126* 117* 99  Lipid Profile: Recent Labs    09/19/20 0031  CHOL 117  HDL 36*  LDLCALC 72  TRIG 43  CHOLHDL 3.3   Thyroid Function Tests: Recent Labs    09/18/20 1720  TSH 3.642   Anemia Panel: No results for input(s): VITAMINB12, FOLATE, FERRITIN, TIBC, IRON, RETICCTPCT in the last 72 hours. Sepsis Labs: Recent Labs  Lab 09/15/20 1550  LATICACIDVEN 1.1    Recent Results (from the past 240 hour(s))  Resp Panel by RT-PCR (Flu A&B, Covid) Nasopharyngeal Swab     Status: None   Collection Time: 09/15/20  3:43 PM   Specimen: Nasopharyngeal Swab; Nasopharyngeal(NP) swabs in vial transport medium  Result Value Ref Range Status   SARS Coronavirus 2 by RT PCR NEGATIVE NEGATIVE Final    Comment: (NOTE) SARS-CoV-2 target nucleic acids are NOT DETECTED.  The SARS-CoV-2 RNA is generally detectable in upper respiratory specimens during the acute phase of infection. The lowest concentration of SARS-CoV-2 viral copies this assay can detect is 138 copies/mL. A negative result does not preclude SARS-Cov-2 infection and should not be used as the sole basis for treatment or other  patient management decisions. A negative result may occur with  improper specimen collection/handling, submission of specimen other than nasopharyngeal swab, presence of viral mutation(s) within the areas targeted by this assay, and inadequate number of viral copies(<138 copies/mL). A negative result must be combined with clinical observations, patient history, and epidemiological information. The expected result is Negative.  Fact Sheet for Patients:  EntrepreneurPulse.com.au  Fact Sheet for Healthcare Providers:  IncredibleEmployment.be  This test is no t yet approved or cleared by the [place] FDA and  has been authorized for detection and/or diagnosis of SARS-CoV-2 by FDA under an Emergency Use Authorization (EUA). This EUA will remain  in effect (meaning this test can be used) for the duration of the COVID-19 declaration under Section 564(b)(1) of the Act, 21 U.S.C.section 360bbb-3(b)(1), unless the authorization is terminated  or revoked sooner.       Influenza A by PCR NEGATIVE NEGATIVE Final   Influenza B by PCR NEGATIVE NEGATIVE Final    Comment: (NOTE) The Xpert Xpress SARS-CoV-2/FLU/RSV plus assay is intended as an aid in the diagnosis of influenza from Nasopharyngeal swab specimens and should not be used as a sole basis for treatment. Nasal washings and aspirates are unacceptable for Xpert Xpress SARS-CoV-2/FLU/RSV testing.  Fact Sheet for Patients: EntrepreneurPulse.com.au  Fact Sheet for Healthcare Providers: IncredibleEmployment.be  This test is not yet approved or cleared by the [place] FDA and has been authorized for detection and/or diagnosis of SARS-CoV-2 by FDA under an Emergency Use Authorization (EUA). This EUA will remain in effect (meaning this test can be used) for the duration of the COVID-19 declaration under Section 564(b)(1) of the Act, 21 U.S.C. section  360bbb-3(b)(1), unless the authorization is terminated or revoked.  Performed at [facility], [place] [address] [place], [place] [address]   Blood culture (routine x 2)     Status: None   Collection Time: 09/15/20  3:43 PM   Specimen: BLOOD  Result Value Ref Range Status   Specimen Description   Final    BLOOD LEFT ANTECUBITAL Performed at [facility] [address] [place], [place] [address]    Special Requests   Final    BOTTLES DRAWN AEROBIC AND ANAEROBIC Blood Culture adequate volume Performed at [place] [address] [place], [place] [address]    Culture  Final    NO GROWTH 5 DAYS Performed at [facility], [place] [address] [place], [place] [address]    Report Status 09/20/2020 FINAL  Final  Blood culture (routine x 2)     Status: None   Collection Time: 09/15/20  3:48 PM   Specimen: BLOOD  Result Value Ref Range Status   Specimen Description   Final    BLOOD RIGHT ANTECUBITAL Performed at [facility] [address], [place], [place] [address]    Special Requests   Final    BOTTLES DRAWN AEROBIC AND ANAEROBIC Blood Culture adequate volume Performed at [place] [address], [place], [place] [address]    Culture   Final    NO GROWTH 5 DAYS Performed at [facility], [place] [address] [place], [place] [address]    Report Status 09/20/2020 FINAL  Final  Body fluid culture w Gram Stain     Status: None   Collection Time: 09/16/20  9:22 AM   Specimen: Pleura  Result Value Ref Range Status   Specimen Description   Final    PLEURAL RIGHT Performed at [facility] [address], [place], [place] [address]    Special Requests   Final    NONE Performed at [facility], [place] [address], [place], [place] [address]    Gram Stain   Final    FEW WBC PRESENT, PREDOMINANTLY MONONUCLEAR NO ORGANISMS SEEN     Culture   Final    NO GROWTH 3 DAYS Performed at [facility] [address], [place], [place] [address]    Report Status 09/20/2020 FINAL  Final         Radiology Studies: CT CHEST WO CONTRAST  Result Date: 09/20/2020 CLINICAL DATA:  Pleural effusion, dyspnea EXAM: CT CHEST WITHOUT CONTRAST TECHNIQUE: Multidetector CT imaging of the chest was performed following the standard protocol without IV contrast. COMPARISON:  01/15/2021 FINDINGS: Cardiovascular: Extensive multi-vessel coronary artery calcification is again noted. There is extensive calcification of the aortic valve leaflets. Global cardiac size is within normal limits. No pericardial effusion. The central pulmonary arteries are enlarged in keeping with changes of pulmonary arterial hypertension. There is moderate atherosclerotic calcification within the thoracic aorta. No aortic aneurysm. Mediastinum/Nodes: There is mild mediastinal shift to the right related to right-sided volume loss again noted. Shotty mediastinal adenopathy is stable, possibly reactive in nature. No frankly pathologic thoracic adenopathy identified. The esophagus is unremarkable. The visualized thyroid is unremarkable. Lungs/Pleura: Moderate to large right complex laterally loculated pleural effusion is unchanged in size and position extending from the apex to the right lung base and circumferentially constricting of the right lung. There is pleural thickening identified as well as heterogeneity of the a contents of the pleural effusion suggesting proteinaceous or hemorrhagic material. There is stable subtotal collapse and consolidation of the right lung with a pleural rind again identified. Moderate dependently layering left pleural effusion with compressive atelectasis of the left lower lobe is unchanged. Left apical subpleural paramediastinal nodule is unchanged measuring 2.3 x 1.6 cm at axial image # 34/5. No pneumothorax.  No central obstructing lesion. Upper  Abdomen: No acute abnormality. Previously noted subtle hepatic abnormalities are not as well appreciated on this examination in absence of contrast administration. Musculoskeletal: No lytic or blastic bone lesion identified. IMPRESSION: Stable examination with moderate to large laterally loculated complex pleural effusion demonstrating heterogeneous internal attenuation suggesting proteinaceous or hemorrhagic material and  associated pleural thickening. Stable subtotal collapse and consolidation of the right lung with volume loss and mediastinal shift to the right. Moderate left dependently layering pleural effusion. Indeterminate 2.3 cm soft tissue nodule within the left apex suspicious for a primary bronchogenic neoplasm. PET CT examination may be helpful for further evaluation. Extensive multi-vessel coronary artery calcification. Extensive calcification of the aortic valve leaflets. Morphologic changes in keeping with pulmonary arterial hypertension. Aortic Atherosclerosis (ICD10-I70.0). Electronically Signed   By: Fidela Salisbury MD   On: 09/20/2020 20:30   DG CHEST PORT 1 VIEW  Result Date: 09/20/2020 CLINICAL DATA:  Pleural effusion EXAM: PORTABLE CHEST 1 VIEW COMPARISON:  09/16/2020, CT chest 09/15/2020 FINDINGS: Moderate loculated right pleural effusion and pleural disease without great change. Possible tiny loculated pneumothorax at the right apex also unchanged. Left upper lung lung mass not well demonstrated on radiography. Stable cardiomediastinal silhouette. Small left effusion IMPRESSION: 1. Grossly stable moderate to large loculated right pleural effusion and pleural disease with possible trace loculated pneumothorax at the right apex 2. Similar airspace disease at the right lung base. Probable small left effusion Electronically Signed   By: Donavan Foil M.D.   On: 09/20/2020 18:51   ECHOCARDIOGRAM COMPLETE  Result Date: 09/20/2020    ECHOCARDIOGRAM REPORT   Patient Name:   DILLIN LOFGREN Date of  Exam: 09/20/2020 Medical Rec #:  [id number]     Height:       70.0 in Accession #:    [id number]    Weight:       157.2 lb Date of Birth:  1929/10/29     BSA:          1.884 m Patient Age:    41 years      BP:           102/67 mmHg Patient Gender: M             HR:           80 bpm. Exam Location:  Inpatient Procedure: 2D Echo Indications:    Atrial Fibrillation  History:        Patient has prior history of Echocardiogram examinations, most                 recent 09/16/2020. Signs/Symptoms:Murmur; Risk                 Factors:Hypertension and Diabetes.  Sonographer:    Mikki Santee RDCS (AE) Referring Phys: [id number] Darreld Mclean  Sonographer Comments: Technically difficult study due to poor echo windows, suboptimal apical window and suboptimal subcostal window. IMPRESSIONS  1. As with prior echo 09/16/20 poor quality images LV has abnormal septal motion unable to assess RWMA;s consider TEE if clinically indicated to also evaluate severity of AS. Left ventricular ejection fraction, by estimation, is ? 50-55%%. The left ventricle has mildly decreased function. Left ventricular endocardial border not optimally defined to evaluate regional wall motion. There is mild left ventricular hypertrophy. Left ventricular diastolic parameters are indeterminate.  2. Right ventricular systolic function was not well visualized. The right ventricular size is not well visualized.  3. The mitral valve was not well visualized. Trivial mitral valve regurgitation.  4. Poorly visualized some calcium seen mean gradient 19 peak 37 mmHg likely moderate AS. Consider TEE if clinically indicated . The aortic valve was not well visualized. Aortic valve regurgitation is not visualized. Moderate aortic valve stenosis.  5. Pulmonic valve regurgitation poorly seen. FINDINGS  Left Ventricle: As with prior echo 09/16/20  poor quality images LV has abnormal septal motion unable to assess RWMA;s consider TEE if clinically indicated to also evaluate  severity of AS. Left ventricular ejection fraction, by estimation, is ? 50-55%%. The left ventricle has mildly decreased function. Left ventricular endocardial border not optimally defined to evaluate regional wall motion. The left ventricular internal cavity size was normal in size. There is mild left ventricular hypertrophy. Left ventricular diastolic parameters are indeterminate. Right Ventricle: The right ventricular size is not well visualized. Right vetricular wall thickness was not well visualized. Right ventricular systolic function was not well visualized. Left Atrium: Left atrial size was normal in size. Right Atrium: Right atrial size was not well visualized. Pericardium: There is no evidence of pericardial effusion. Mitral Valve: The mitral valve was not well visualized. Trivial mitral valve regurgitation. Tricuspid Valve: The tricuspid valve is normal in structure. Tricuspid valve regurgitation is mild. Aortic Valve: Poorly visualized some calcium seen mean gradient 19 peak 37 mmHg likely moderate AS. Consider TEE if clinically indicated. The aortic valve was not well visualized. Aortic valve regurgitation is not visualized. Moderate aortic stenosis is present. Aortic valve mean gradient measures 19.0 mmHg. Aortic valve peak gradient measures 35.6 mmHg. Aortic valve area, by VTI measures 0.73 cm. Pulmonic Valve: The pulmonic valve was not well visualized. Pulmonic valve regurgitation poorly seen. Aorta: The aortic root was not well visualized. IAS/Shunts: The interatrial septum was not well visualized.  LEFT VENTRICLE PLAX 2D LVIDd:         4.17 cm  Diastology LVIDs:         2.59 cm  LV e' medial:    9.36 cm/s LV PW:         1.23 cm  LV E/e' medial:  1.0 LV IVS:        0.91 cm  LV e' lateral:   9.68 cm/s LVOT diam:     2.20 cm  LV E/e' lateral: 1.0 LV SV:         43 LV SV Index:   23 LVOT Area:     3.80 cm  LEFT ATRIUM           Index       RIGHT ATRIUM           Index LA diam:      3.10 cm 1.65 cm/m   RA Area:     20.90 cm LA Vol (A2C): 69.7 ml 36.99 ml/m RA Volume:   61.60 ml  32.69 ml/m  AORTIC VALVE AV Area (Vmax):    0.71 cm AV Area (Vmean):   0.73 cm AV Area (VTI):     0.73 cm AV Vmax:           298.50 cm/s AV Vmean:          199.500 cm/s AV VTI:            0.588 m AV Peak Grad:      35.6 mmHg AV Mean Grad:      19.0 mmHg LVOT Vmax:         55.40 cm/s LVOT Vmean:        38.500 cm/s LVOT VTI:          0.113 m LVOT/AV VTI ratio: 0.19  AORTA Ao Root diam: 2.80 cm MV E velocity: 9.36 cm/s                           SHUNTS  Systemic VTI:  0.11 m                           Systemic Diam: 2.20 cm Jenkins Rouge MD Electronically signed by Jenkins Rouge MD Signature Date/Time: 09/20/2020/12:05:21 PM    Final         Scheduled Meds: . fluticasone  2 spray Each Nare Daily  . furosemide  40 mg Oral Daily  . ipratropium-albuterol  3 mL Nebulization BID  . metoprolol tartrate  25 mg Oral BID  . polyethylene glycol  17 g Oral Daily  . senna-docusate  1 tablet Oral BID  . simvastatin  20 mg Oral q1800  . tamsulosin  0.4 mg Oral Daily   Continuous Infusions: . heparin 1,350 Units/hr (09/21/20 0318)     LOS: 6 days    Time spent: 25 mins.More than 50% of that time was spent in counseling and/or coordination of care.      Shelly Coss, MD [facility] P5/09/2020, 8:08 AM l;l

## 2020-09-21 NOTE — Progress Notes (Signed)
MEDICATION-RELATED CONSULT NOTE   IR Procedure Consult - Anticoagulant/Antiplatelet PTA/Inpatient Med List Review by Pharmacist    Procedure:  CT right chest drain placement     Completed: 8850  Post-Procedural bleeding risk per IR MD assessment:  Low  Antithrombotic medications on inpatient or PTA profile prior to procedure:   Heparin infusion    Recommended restart time per IR Post-Procedure Guidelines:   4 hours after procedure  EBL: minimal Complications: none immediate  Plan:     At 2100 this evening restart heparin at 1350 units/hr Monitor daily heparin level, CBC, s/s bleeding  Garnett Nunziata P. Legrand Como, PharmD, [place] Please utilize Amion for appropriate phone number to reach the unit pharmacist ([place]) 09/21/2020 5:38 PM

## 2020-09-21 NOTE — Progress Notes (Signed)
[place] for Heparin  Indication: atrial fibrillation  Allergies  Allergen Reactions  . Lisinopril     REACTION: Rash    Patient Measurements: Weight: 71.3 kg (157 lb 3 oz) Heparin Dosing Weight: 73.3 kg  Vital Signs: Temp: 97.9 F (36.6 C) (05/05 0603) Temp Source: Oral (05/05 0603) BP: 120/81 (05/05 0603) Pulse Rate: 80 (05/05 0603)  Labs: Recent Labs    09/18/20 1032 09/19/20 0031 09/19/20 2054 09/20/20 0419 09/20/20 1236 09/21/20 0440  HGB 12.0*  --   --   --  11.6* 11.0*  HCT 39.2  --   --   --  38.3* 36.0*  PLT 215  --   --   --   --  188  HEPARINUNFRC  --    < > 0.50 0.66  --  0.51  CREATININE 1.11  --   --   --   --  0.82   < > = values in this interval not displayed.    Estimated Creatinine Clearance: 61.6 mL/min (by C-G formula based on SCr of 0.82 mg/dL).   Medications:  Scheduled:  . fluticasone  2 spray Each Nare Daily  . furosemide  40 mg Oral Daily  . ipratropium-albuterol  3 mL Nebulization BID  . metoprolol tartrate  25 mg Oral BID  . polyethylene glycol  17 g Oral Daily  . senna-docusate  1 tablet Oral BID  . simvastatin  20 mg Oral q1800  . tamsulosin  0.4 mg Oral Daily   Infusions:  . heparin 1,350 Units/hr (09/21/20 0318)    Assessment: 53 yoM presents to ED with SOB and LE edema.  Found to have new onset atrial fibrillation and aortic stenosis.  Pharmacy is consulted on 5/2 to dose heparin for Afib. No PTA anticoagulation reported.  Baseline coags (4/29) INR 1.1 S/p thoracentesis on 4/30 - cytology pending.  Follow up plans if further procedures or biopsy will be needed. CBC:  Hgb down to 12, Plt WNL  Goal of Therapy:  Heparin level 0.3-0.7 units/ml Monitor platelets by anticoagulation protocol: Yes   09/21/2020  Daily heparin level therapeutic on 1350 units/hr  Hgb and Plt both down slightly from yesterday  Daughter reported mild rectal bleeding on 5/4; however FOBT returned negative  and hemoglobin relatively stable (down 0.6g and Hgb remains > 10g)  No new bleeding or infusion issues per RN  IR evaluating for possible chest tube placement today    Plan:   Continue IV heparin at 1350 units/hr  Daily CBC, daily Heparin level  Monitor for s/s thrombosis, bleeding  F/u plans for chest tube placement/pausing anticoag  Cardiology recommends Apixaban 5mg  bid if no further procedures needed, monitor blood per rectum  Reuel Boom, PharmD, BCPS [phone] 09/21/2020, 7:30 AM

## 2020-09-21 NOTE — Procedures (Signed)
  Procedure: CT right chest drain placement   EBL:   minimal Complications:  none immediate  See full dictation in [facility].  Dillard Cannon MD Main # [phone] Pager  [phone] Mobile [phone]

## 2020-09-22 ENCOUNTER — Inpatient Hospital Stay (HOSPITAL_COMMUNITY): Payer: Medicare Other

## 2020-09-22 ENCOUNTER — Telehealth: Payer: Self-pay | Admitting: Pulmonary Disease

## 2020-09-22 DIAGNOSIS — R918 Other nonspecific abnormal finding of lung field: Secondary | ICD-10-CM

## 2020-09-22 DIAGNOSIS — I10 Essential (primary) hypertension: Secondary | ICD-10-CM | POA: Diagnosis not present

## 2020-09-22 DIAGNOSIS — I350 Nonrheumatic aortic (valve) stenosis: Secondary | ICD-10-CM | POA: Diagnosis not present

## 2020-09-22 DIAGNOSIS — R0602 Shortness of breath: Secondary | ICD-10-CM | POA: Diagnosis not present

## 2020-09-22 DIAGNOSIS — J90 Pleural effusion, not elsewhere classified: Secondary | ICD-10-CM | POA: Diagnosis not present

## 2020-09-22 DIAGNOSIS — I4891 Unspecified atrial fibrillation: Secondary | ICD-10-CM | POA: Diagnosis not present

## 2020-09-22 LAB — CBC
HCT: 38.2 % — ABNORMAL LOW (ref 39.0–52.0)
Hemoglobin: 11.7 g/dL — ABNORMAL LOW (ref 13.0–17.0)
MCH: 25.8 pg — ABNORMAL LOW (ref 26.0–34.0)
MCHC: 30.6 g/dL (ref 30.0–36.0)
MCV: 84.1 fL (ref 80.0–100.0)
Platelets: 156 10*3/uL (ref 150–400)
RBC: 4.54 MIL/uL (ref 4.22–5.81)
RDW: 16 % — ABNORMAL HIGH (ref 11.5–15.5)
WBC: 5.6 10*3/uL (ref 4.0–10.5)
nRBC: 0 % (ref 0.0–0.2)

## 2020-09-22 LAB — CHOLESTEROL, BODY FLUID: Cholesterol, Fluid: 17 mg/dL

## 2020-09-22 LAB — GLUCOSE, CAPILLARY
Glucose-Capillary: 97 mg/dL (ref 70–99)
Glucose-Capillary: 98 mg/dL (ref 70–99)
Glucose-Capillary: 96 mg/dL (ref 70–99)
Glucose-Capillary: 127 mg/dL — ABNORMAL HIGH (ref 70–99)

## 2020-09-22 LAB — HEPARIN LEVEL (UNFRACTIONATED): Heparin Unfractionated: 0.27 IU/mL — ABNORMAL LOW (ref 0.30–0.70)

## 2020-09-22 MED ORDER — APIXABAN 5 MG PO TABS
5.0000 mg | ORAL_TABLET | Freq: Two times a day (BID) | ORAL | Status: DC
  Administered 2020-09-22 – 2020-09-28 (×13): 5 mg via ORAL
  Filled 2020-09-22 (×14): qty 1

## 2020-09-22 NOTE — Progress Notes (Signed)
[place] for Apixaban  Indication: atrial fibrillation  Allergies  Allergen Reactions  . Lisinopril     REACTION: Rash   Patient Measurements: Weight: 72.3 kg (159 lb 6.3 oz) Heparin Dosing Weight: 73.3 kg  Vital Signs: Temp: 98.2 F (36.8 C) (05/06 0418) Temp Source: Oral (05/06 0418) BP: 107/58 (05/06 0418) Pulse Rate: 64 (05/06 0418)  Labs: Recent Labs    09/20/20 0419 09/20/20 1236 09/20/20 1236 09/21/20 0440 09/22/20 0445  HGB  --  11.6*   < > 11.0* 11.7*  HCT  --  38.3*  --  36.0* 38.2*  PLT  --   --   --  188 156  HEPARINUNFRC 0.66  --   --  0.51 0.27*  CREATININE  --   --   --  0.82  --    < > = values in this interval not displayed.   Estimated Creatinine Clearance: 62.5 mL/min (by C-G formula based on SCr of 0.82 mg/dL).  Medications:  Scheduled:  . apixaban  5 mg Oral BID  . fluticasone  2 spray Each Nare Daily  . furosemide  40 mg Oral Daily  . ipratropium-albuterol  3 mL Nebulization BID  . metoprolol tartrate  25 mg Oral BID  . polyethylene glycol  17 g Oral Daily  . senna-docusate  1 tablet Oral BID  . simvastatin  20 mg Oral q1800  . tamsulosin  0.4 mg Oral Daily   Infusions:  . heparin 1,500 Units/hr (09/22/20 0530)   Assessment: 54 yoM presents to ED with SOB and LE edema.  Found to have new onset atrial fibrillation and aortic stenosis.  Pharmacy is consulted on 5/2 to dose heparin for Afib. No PTA anticoagulation reported.  Baseline coags (4/29) INR 1.1 S/p thoracentesis on 4/30 - cytology pending.  Follow up plans if further procedures or biopsy will be needed.  5/5 R chest tube by IR  09/22/2020 Transition to Apixaban for AFib   Goal of Therapy:  Heparin level 0.3-0.7 units/ml Monitor platelets by anticoagulation protocol: Yes     Plan:  Discontinue Heparin infusion at 0900 Begin Apixaban 5mg  bid at 0900 Monitor for s/s bleeding Educate patient on Apixaban for Afib  Minda Ditto  PharmD 09/22/2020, 8:29 AM

## 2020-09-22 NOTE — Progress Notes (Addendum)
[place] for Heparin  Indication: atrial fibrillation  Allergies  Allergen Reactions  . Lisinopril     REACTION: Rash    Patient Measurements: Weight: 72.3 kg (159 lb 6.3 oz) Heparin Dosing Weight: 73.3 kg  Vital Signs: Temp: 98.2 F (36.8 C) (05/06 0418) Temp Source: Oral (05/06 0418) BP: 107/58 (05/06 0418) Pulse Rate: 64 (05/06 0418)  Labs: Recent Labs    09/20/20 0419 09/20/20 1236 09/20/20 1236 09/21/20 0440 09/22/20 0445  HGB  --  11.6*   < > 11.0* 11.7*  HCT  --  38.3*  --  36.0* 38.2*  PLT  --   --   --  188 156  HEPARINUNFRC 0.66  --   --  0.51 0.27*  CREATININE  --   --   --  0.82  --    < > = values in this interval not displayed.    Estimated Creatinine Clearance: 62.5 mL/min (by C-G formula based on SCr of 0.82 mg/dL).   Medications:  Scheduled:  . fluticasone  2 spray Each Nare Daily  . furosemide  40 mg Oral Daily  . ipratropium-albuterol  3 mL Nebulization BID  . metoprolol tartrate  25 mg Oral BID  . polyethylene glycol  17 g Oral Daily  . senna-docusate  1 tablet Oral BID  . simvastatin  20 mg Oral q1800  . tamsulosin  0.4 mg Oral Daily   Infusions:  . heparin 1,350 Units/hr (09/22/20 0357)    Assessment: 62 yoM presents to ED with SOB and LE edema.  Found to have new onset atrial fibrillation and aortic stenosis.  Pharmacy is consulted on 5/2 to dose heparin for Afib. No PTA anticoagulation reported.  Baseline coags (4/29) INR 1.1 S/p thoracentesis on 4/30 - cytology pending.  Follow up plans if further procedures or biopsy will be needed.  5/5 R chest tube by IR  09/22/2020 HL 0.27 subtherapeutic on 1350 units/hr Hgb 11.7, Plts 156 per RN but no bleeding, chest tube draining only small amout   Goal of Therapy:  Heparin level 0.3-0.7 units/ml Monitor platelets by anticoagulation protocol: Yes     Plan:   Increase heparin drip to 1500 units/hr  8 hour heparin level  Daily CBC, daily  Heparin level  Monitor for s/s thrombosis, bleeding  Cardiology recommends Apixaban 5mg  bid if no further procedures needed, monitor blood per rectum  Dolly Rias RPh 09/22/2020, 5:09 AM

## 2020-09-22 NOTE — Progress Notes (Signed)
PROGRESS NOTE    Adam Briggs  TMH:[id number] DOB: 09/19/1929 DOA: 09/15/2020 PCP: [name], MD   Chief Complain: Dyspnea  Brief Narrative: Patient is a 87 year old male with a past medical history of hypertension, dyslipidemia, aortic stenosis, who presents to the emergency department on 4/22 with complaints of dyspnea, generalized weakness.  He was seen at his cardiology office where he was found to have pleural effusion, cardiomegaly and he was recommended to go to the emergency department.  On presentation he was tachycardic but saturating fine on room air.  X-ray showed large loculated right pleural effusion.  CT chest confirmed loculated right pleural effusion with irregular pleural thickening suspicious for neoplastic process, moderate left pleural effusion.  PCCM consulted and he underwent thoracentesis.  Pleural cytology was nonconclusive, did not show any malignant cells.Hospital course remarkable for persistent new onset A. fib with RVR, cardiology were consulted.Underwent  right  chest tube placement by IR on 09/21/20.    Assessment & Plan:   Active Problems:   Essential hypertension   Moderate aortic stenosis   SOB (shortness of breath)   Pleural effusion, right   Acute respiratory failure with hypoxia (HCC)   Mass of upper lobe of left lung   Atrial fibrillation, new onset (HCC)   Atrial fibrillation (HCC)   S/P thoracentesis   Acute hypoxic respiratory failure: Secondary to bilateral pleural effusion.  Currently on room air.  Continue supplemental oxygen as needed.  Bilateral pleural effusion: X-ray showed large loculated right pleural effusion.  CT chest confirmed loculated right pleural effusion with irregular pleural thickening suspicious for neoplastic process, moderate left pleural effusion.  PCCM consulted and he underwent thoracentesis.  Pleural fluid cytology report inconclusive/negative for malignant cells. CT chest done on 09/20/2020 showed stable  moderate to large laterally loculated complex pleural effusion demonstrating heterogeneous internal attenuation suggesting proteinaceous or hemorrhagic material andassociated pleural thickening,stable subtotal collapse and consolidation of the right lung with volume loss and mediastinal shift to the right., possible left moderate pleural effusion. S/P right chest tube placement by IR on 09/21/20 Follow-up chest x-ray done on 09/22/20 showed significantly less pleural fluid on the right,areas of loculated effusion with areas of patchy airspace opacity and atelectasis .  Consideration of  for intrapleural lytics consideration by PCCM On Lasix daily.  Left upper lobe lung mass/right lung lesion:Follow CT has again shown ndeterminate 2.3 cm soft tissue nodule within the left apex suspicious for a primary bronchogenic neoplasm. Plan for PET CT as an outpatient.PCCM following.  New onset A. fib: On presentation.  Monitor on telemetry.  Requested  cardiology evaluation for decision on starting anticoagulation on this elderly male with multiple problems. CHA2DS2VASc score of 3.  Started on heparin drip , now changed to Eliquis.  Also started on Lopressor for rate control. TSH normal.  Currently rate is controlled.  Aortic stenosis: Diagnosed with aortic stenosis but has not followed up last 7 years.  Echo showed moderate aortic stenosis  Hypertension: Continue home Norvasc,metoprolol.  Home losartan/hydrochlorothiazide was held.  Currently blood pressure stable.  Hyperlipidemia: Continue statin         DVT prophylaxis:Eliquis Code Status: Full Family Communication: daughter at bedside on 09/22/20 Status is: Inpatient  Remains inpatient appropriate because:Inpatient level of care appropriate due to severity of illness   Dispo: The patient is from: Home              Anticipated d/c is to: Home  Patient currently is not medically stable to d/c.   Difficult to place patient  No     Consultants: PCCM  Procedures:Thoracentesisdfgd  Antimicrobials:  Anti-infectives (From admission, onward)   Start     Dose/Rate Route Frequency Ordered Stop   09/21/20 1552  ceFAZolin (ANCEF) 2-4 GM/100ML-% IVPB  Status:  Discontinued       Note to Pharmacy: Lesia Hausen   : cabinet override      09/21/20 1552 09/21/20 1631      Subjective:  Patient seen and examined the bedside this morning.  Hemodynamically stable.  Right-sided chest tube draining sanguinous fluid.  Overall looks very comfortable, denies any shortness of breath or chest pain.  Daughter at bedside.  Objective: Vitals:   09/21/20 2038 09/22/20 0418 09/22/20 0421 09/22/20 0805  BP:  (!) 107/58    Pulse:  64    Resp:  16    Temp:  98.2 F (36.8 C)    TempSrc:  Oral    SpO2: 94% 96%  95%  Weight:   72.3 kg     Intake/Output Summary (Last 24 hours) at 09/22/2020 0810 Last data filed at 09/22/2020 0530 Gross per 24 hour  Intake 180.52 ml  Output 830 ml  Net -649.48 ml   Filed Weights   09/19/20 0500 09/20/20 0542 09/22/20 0421  Weight: 73.3 kg 71.3 kg 72.3 kg    Examination:  General exam: Overall comfortable, not in distress HEENT: PERRL Respiratory system:  no wheezes or crackles , right-sided chest tube, slightly diminished air entry on the right side Cardiovascular system: S1 & S2 heard, RRR.  Gastrointestinal system: Abdomen is nondistended, soft and nontender. Central nervous system: Alert and oriented Extremities: No edema, no clubbing ,no cyanosis Skin: No rashes, no ulcers,no icterus   Data Reviewed: I have personally reviewed following labs and imaging studies  CBC: Recent Labs  Lab 09/15/20 1550 09/15/20 2047 09/16/20 0359 09/18/20 1032 09/20/20 1236 09/21/20 0440 09/22/20 0445  WBC 7.4 5.9 5.2 5.0  --  5.1 5.6  NEUTROABS 5.8  --   --  3.9  --  3.5  --   HGB 12.5* 12.1* 12.7* 12.0* 11.6* 11.0* 11.7*  HCT 40.7 39.3 40.1 39.2 38.3* 36.0* 38.2*  MCV 83.9 84.2 82.2 82.7   --  83.3 84.1  PLT 230 214 190 215  --  188 716   Basic Metabolic Panel: Recent Labs  Lab 09/15/20 1132 09/15/20 1550 09/16/20 0359 09/18/20 1032 09/21/20 0440  NA 139 138 138 136 139  K 3.9 3.7 3.8 3.7 3.7  CL 100 100 99 96* 101  CO2 31 30 32 32 26  GLUCOSE 104* 128* 126* 122* 104*  BUN 26* 26* 21 31* 27*  CREATININE 0.98 0.95 0.90 1.11 0.82  CALCIUM 9.4 9.3 8.8* 8.8* 8.7*  MG  --   --  2.0  --   --   PHOS  --   --  3.7  --   --    GFR: Estimated Creatinine Clearance: 62.5 mL/min (by C-G formula based on SCr of 0.82 mg/dL). Liver Function Tests: Recent Labs  Lab 09/15/20 1132 09/15/20 1550 09/15/20 2047 09/16/20 0359  AST 11 15  --  16  ALT 12 15  --  13  ALKPHOS 59 62  --  55  BILITOT 0.6 0.5  --  0.5  PROT 6.7 7.1 6.6 6.1*  ALBUMIN 3.9 3.9 3.6 3.3*   No results for input(s): LIPASE, AMYLASE in the last 168  hours. No results for input(s): AMMONIA in the last 168 hours. Coagulation Profile: Recent Labs  Lab 09/15/20 2047  INR 1.1   Cardiac Enzymes: No results for input(s): CKTOTAL, CKMB, CKMBINDEX, TROPONINI in the last 168 hours. BNP (last 3 results) Recent Labs    09/15/20 1132  PROBNP 238.0*   HbA1C: No results for input(s): HGBA1C in the last 72 hours. CBG: Recent Labs  Lab 09/21/20 0729 09/21/20 1152 09/21/20 1705 09/21/20 2026 09/22/20 0725  GLUCAP 99 93 89 177* 97   Lipid Profile: No results for input(s): CHOL, HDL, LDLCALC, TRIG, CHOLHDL, LDLDIRECT in the last 72 hours. Thyroid Function Tests: No results for input(s): TSH, T4TOTAL, FREET4, T3FREE, THYROIDAB in the last 72 hours. Anemia Panel: No results for input(s): VITAMINB12, FOLATE, FERRITIN, TIBC, IRON, RETICCTPCT in the last 72 hours. Sepsis Labs: Recent Labs  Lab 09/15/20 1550  LATICACIDVEN 1.1    Recent Results (from the past 240 hour(s))  Resp Panel by RT-PCR (Flu A&B, Covid) Nasopharyngeal Swab     Status: None   Collection Time: 09/15/20  3:43 PM   Specimen:  Nasopharyngeal Swab; Nasopharyngeal(NP) swabs in vial transport medium  Result Value Ref Range Status   SARS Coronavirus 2 by RT PCR NEGATIVE NEGATIVE Final    Comment: (NOTE) SARS-CoV-2 target nucleic acids are NOT DETECTED.  The SARS-CoV-2 RNA is generally detectable in upper respiratory specimens during the acute phase of infection. The lowest concentration of SARS-CoV-2 viral copies this assay can detect is 138 copies/mL. A negative result does not preclude SARS-Cov-2 infection and should not be used as the sole basis for treatment or other patient management decisions. A negative result may occur with  improper specimen collection/handling, submission of specimen other than nasopharyngeal swab, presence of viral mutation(s) within the areas targeted by this assay, and inadequate number of viral copies(<138 copies/mL). A negative result must be combined with clinical observations, patient history, and epidemiological information. The expected result is Negative.  Fact Sheet for Patients:  EntrepreneurPulse.com.au  Fact Sheet for Healthcare Providers:  IncredibleEmployment.be  This test is no t yet approved or cleared by the [place] FDA and  has been authorized for detection and/or diagnosis of SARS-CoV-2 by FDA under an Emergency Use Authorization (EUA). This EUA will remain  in effect (meaning this test can be used) for the duration of the COVID-19 declaration under Section 564(b)(1) of the Act, 21 U.S.C.section 360bbb-3(b)(1), unless the authorization is terminated  or revoked sooner.       Influenza A by PCR NEGATIVE NEGATIVE Final   Influenza B by PCR NEGATIVE NEGATIVE Final    Comment: (NOTE) The Xpert Xpress SARS-CoV-2/FLU/RSV plus assay is intended as an aid in the diagnosis of influenza from Nasopharyngeal swab specimens and should not be used as a sole basis for treatment. Nasal washings and aspirates are unacceptable for  Xpert Xpress SARS-CoV-2/FLU/RSV testing.  Fact Sheet for Patients: EntrepreneurPulse.com.au  Fact Sheet for Healthcare Providers: IncredibleEmployment.be  This test is not yet approved or cleared by the [place] FDA and has been authorized for detection and/or diagnosis of SARS-CoV-2 by FDA under an Emergency Use Authorization (EUA). This EUA will remain in effect (meaning this test can be used) for the duration of the COVID-19 declaration under Section 564(b)(1) of the Act, 21 U.S.C. section 360bbb-3(b)(1), unless the authorization is terminated or revoked.  Performed at [facility], [place] [address], [place], [place] [address]   Blood culture (routine x 2)     Status:  None   Collection Time: 09/15/20  3:43 PM   Specimen: BLOOD  Result Value Ref Range Status   Specimen Description   Final    BLOOD LEFT ANTECUBITAL Performed at [facility] [address], [place], [place] [address]    Special Requests   Final    BOTTLES DRAWN AEROBIC AND ANAEROBIC Blood Culture adequate volume Performed at [facility] [address], [place], [place] [address]    Culture   Final    NO GROWTH 5 DAYS Performed at [facility], [place] [address] [place], [place] [address]    Report Status 09/20/2020 FINAL  Final  Blood culture (routine x 2)     Status: None   Collection Time: 09/15/20  3:48 PM   Specimen: BLOOD  Result Value Ref Range Status   Specimen Description   Final    BLOOD RIGHT ANTECUBITAL Performed at [facility] [address], [place], [place] [address]    Special Requests   Final    BOTTLES DRAWN AEROBIC AND ANAEROBIC Blood Culture adequate volume Performed at [facility] [address], [place], [place] [address]    Culture   Final    NO GROWTH 5 DAYS Performed at [facility], [place] [address] [place], [place] [address]     Report Status 09/20/2020 FINAL  Final  Fungus Culture With Stain     Status: None (Preliminary result)   Collection Time: 09/16/20  9:20 AM   Specimen: Pleural, Right; Pleural Fluid  Result Value Ref Range Status   Fungus Stain Final report  Final    Comment: (NOTE) Performed At: [facility] [address] [place], [place] [id number] Rush Farmer MD [id number]    Fungus (Mycology) Culture PENDING  Incomplete   Fungal Source PLEURAL  Final    Comment: RIGHT Performed at [facility], [place] [address] [place], [place] [address]   Fungus Culture Result     Status: None   Collection Time: 09/16/20  9:20 AM  Result Value Ref Range Status   Result 1 Comment  Final    Comment: (NOTE) KOH/Calcofluor preparation:  no fungus observed. Performed At: [facility] [place], [place] [id number] Rush Farmer MD GB:[id number]   Body fluid culture w Gram Stain     Status: None   Collection Time: 09/16/20  9:22 AM   Specimen: Pleura  Result Value Ref Range Status   Specimen Description   Final    PLEURAL RIGHT Performed at [place] [address] [place], [place] [address]    Special Requests   Final    NONE Performed at [facility], [place] [address], [place], [place] [address]    Gram Stain   Final    FEW WBC PRESENT, PREDOMINANTLY MONONUCLEAR NO ORGANISMS SEEN    Culture   Final    NO GROWTH 3 DAYS Performed at [facility] [address], [place], [place] [address]    Report Status 09/20/2020 FINAL  Final  Aerobic/Anaerobic Culture (surgical/deep wound)     Status: None (Preliminary result)   Collection Time: 09/21/20  4:47 PM   Specimen: Pleural Fluid  Result Value Ref Range Status   Specimen Description   Final    PLEURAL DRAIN Performed at [facility] [address], [place],  [address]    Special Requests   Final    NONE  Performed at  [facility], [place] [address], [place], [place] [address]    Gram Stain   Final    RARE WBC PRESENT,BOTH PMN AND MONONUCLEAR NO ORGANISMS SEEN Performed at [facility], [place] [address], [place], [place] [address]    Culture PENDING  Incomplete   Report Status PENDING  Incomplete         Radiology Studies: CT CHEST WO CONTRAST  Result Date: 09/20/2020 CLINICAL DATA:  Pleural effusion, dyspnea EXAM: CT CHEST WITHOUT CONTRAST TECHNIQUE: Multidetector CT imaging of the chest was performed following the standard protocol without IV contrast. COMPARISON:  01/15/2021 FINDINGS: Cardiovascular: Extensive multi-vessel coronary artery calcification is again noted. There is extensive calcification of the aortic valve leaflets. Global cardiac size is within normal limits. No pericardial effusion. The central pulmonary arteries are enlarged in keeping with changes of pulmonary arterial hypertension. There is moderate atherosclerotic calcification within the thoracic aorta. No aortic aneurysm. Mediastinum/Nodes: There is mild mediastinal shift to the right related to right-sided volume loss again noted. Shotty mediastinal adenopathy is stable, possibly reactive in nature. No frankly pathologic thoracic adenopathy identified. The esophagus is unremarkable. The visualized thyroid is unremarkable. Lungs/Pleura: Moderate to large right complex laterally loculated pleural effusion is unchanged in size and position extending from the apex to the right lung base and circumferentially constricting of the right lung. There is pleural thickening identified as well as heterogeneity of the a contents of the pleural effusion suggesting proteinaceous or hemorrhagic material. There is stable subtotal collapse and consolidation of the right lung with a pleural rind again identified. Moderate dependently layering left pleural effusion with compressive atelectasis of the left lower lobe is unchanged. Left  apical subpleural paramediastinal nodule is unchanged measuring 2.3 x 1.6 cm at axial image # 34/5. No pneumothorax.  No central obstructing lesion. Upper Abdomen: No acute abnormality. Previously noted subtle hepatic abnormalities are not as well appreciated on this examination in absence of contrast administration. Musculoskeletal: No lytic or blastic bone lesion identified. IMPRESSION: Stable examination with moderate to large laterally loculated complex pleural effusion demonstrating heterogeneous internal attenuation suggesting proteinaceous or hemorrhagic material and associated pleural thickening. Stable subtotal collapse and consolidation of the right lung with volume loss and mediastinal shift to the right. Moderate left dependently layering pleural effusion. Indeterminate 2.3 cm soft tissue nodule within the left apex suspicious for a primary bronchogenic neoplasm. PET CT examination may be helpful for further evaluation. Extensive multi-vessel coronary artery calcification. Extensive calcification of the aortic valve leaflets. Morphologic changes in keeping with pulmonary arterial hypertension. Aortic Atherosclerosis (ICD10-I70.0). Electronically Signed   By: Fidela Salisbury MD   On: 09/20/2020 20:30   DG Chest Port 1 View  Result Date: 09/22/2020 CLINICAL DATA:  Chest tube placement on the right EXAM: PORTABLE CHEST 1 VIEW COMPARISON:  Chest radiograph and chest CT Sep 20, 2020 FINDINGS: Chest tube present on the right peripherally. Significantly less pleural effusion on the right following chest tube placement. There remains loculated effusion on the right with areas of airspace opacity throughout the right lung, most notably in the right lower lung region. No appreciable pneumothorax. There is a small left pleural effusion with left base atelectasis. Heart is upper normal in size with pulmonary vascularity normal. No evident adenopathy. IMPRESSION: Chest tube present on the right with significantly  less pleural fluid on the right. Areas of loculated effusion with areas of patchy airspace opacity and atelectasis noted on the right, primarily in the right lower  lung region. Small left pleural effusion with mild left base atelectasis. Stable cardiac silhouette. Electronically Signed   By: Lowella Grip III M.D.   On: 09/22/2020 08:04   DG CHEST PORT 1 VIEW  Result Date: 09/20/2020 CLINICAL DATA:  Pleural effusion EXAM: PORTABLE CHEST 1 VIEW COMPARISON:  09/16/2020, CT chest 09/15/2020 FINDINGS: Moderate loculated right pleural effusion and pleural disease without great change. Possible tiny loculated pneumothorax at the right apex also unchanged. Left upper lung lung mass not well demonstrated on radiography. Stable cardiomediastinal silhouette. Small left effusion IMPRESSION: 1. Grossly stable moderate to large loculated right pleural effusion and pleural disease with possible trace loculated pneumothorax at the right apex 2. Similar airspace disease at the right lung base. Probable small left effusion Electronically Signed   By: Donavan Foil M.D.   On: 09/20/2020 18:51   ECHOCARDIOGRAM COMPLETE  Result Date: 09/20/2020    ECHOCARDIOGRAM REPORT   Patient Name:   KHYRIE MASI Date of Exam: 09/20/2020 Medical Rec #:  [id number]     Height:       70.0 in Accession #:    [id number]    Weight:       157.2 lb Date of Birth:  Oct 07, 1929     BSA:          1.884 m Patient Age:    70 years      BP:           102/67 mmHg Patient Gender: M             HR:           80 bpm. Exam Location:  Inpatient Procedure: 2D Echo Indications:    Atrial Fibrillation  History:        Patient has prior history of Echocardiogram examinations, most                 recent 09/16/2020. Signs/Symptoms:Murmur; Risk                 Factors:Hypertension and Diabetes.  Sonographer:    Mikki Santee RDCS (AE) Referring Phys: [id number] Darreld Mclean  Sonographer Comments: Technically difficult study due to poor echo windows, suboptimal  apical window and suboptimal subcostal window. IMPRESSIONS  1. As with prior echo 09/16/20 poor quality images LV has abnormal septal motion unable to assess RWMA;s consider TEE if clinically indicated to also evaluate severity of AS. Left ventricular ejection fraction, by estimation, is ? 50-55%%. The left ventricle has mildly decreased function. Left ventricular endocardial border not optimally defined to evaluate regional wall motion. There is mild left ventricular hypertrophy. Left ventricular diastolic parameters are indeterminate.  2. Right ventricular systolic function was not well visualized. The right ventricular size is not well visualized.  3. The mitral valve was not well visualized. Trivial mitral valve regurgitation.  4. Poorly visualized some calcium seen mean gradient 19 peak 37 mmHg likely moderate AS. Consider TEE if clinically indicated . The aortic valve was not well visualized. Aortic valve regurgitation is not visualized. Moderate aortic valve stenosis.  5. Pulmonic valve regurgitation poorly seen. FINDINGS  Left Ventricle: As with prior echo 09/16/20 poor quality images LV has abnormal septal motion unable to assess RWMA;s consider TEE if clinically indicated to also evaluate severity of AS. Left ventricular ejection fraction, by estimation, is ? 50-55%%. The left ventricle has mildly decreased function. Left ventricular endocardial border not optimally defined to evaluate regional wall motion. The left ventricular internal cavity size was normal in  size. There is mild left ventricular hypertrophy. Left ventricular diastolic parameters are indeterminate. Right Ventricle: The right ventricular size is not well visualized. Right vetricular wall thickness was not well visualized. Right ventricular systolic function was not well visualized. Left Atrium: Left atrial size was normal in size. Right Atrium: Right atrial size was not well visualized. Pericardium: There is no evidence of pericardial  effusion. Mitral Valve: The mitral valve was not well visualized. Trivial mitral valve regurgitation. Tricuspid Valve: The tricuspid valve is normal in structure. Tricuspid valve regurgitation is mild. Aortic Valve: Poorly visualized some calcium seen mean gradient 19 peak 37 mmHg likely moderate AS. Consider TEE if clinically indicated. The aortic valve was not well visualized. Aortic valve regurgitation is not visualized. Moderate aortic stenosis is present. Aortic valve mean gradient measures 19.0 mmHg. Aortic valve peak gradient measures 35.6 mmHg. Aortic valve area, by VTI measures 0.73 cm. Pulmonic Valve: The pulmonic valve was not well visualized. Pulmonic valve regurgitation poorly seen. Aorta: The aortic root was not well visualized. IAS/Shunts: The interatrial septum was not well visualized.  LEFT VENTRICLE PLAX 2D LVIDd:         4.17 cm  Diastology LVIDs:         2.59 cm  LV e' medial:    9.36 cm/s LV PW:         1.23 cm  LV E/e' medial:  1.0 LV IVS:        0.91 cm  LV e' lateral:   9.68 cm/s LVOT diam:     2.20 cm  LV E/e' lateral: 1.0 LV SV:         43 LV SV Index:   23 LVOT Area:     3.80 cm  LEFT ATRIUM           Index       RIGHT ATRIUM           Index LA diam:      3.10 cm 1.65 cm/m  RA Area:     20.90 cm LA Vol (A2C): 69.7 ml 36.99 ml/m RA Volume:   61.60 ml  32.69 ml/m  AORTIC VALVE AV Area (Vmax):    0.71 cm AV Area (Vmean):   0.73 cm AV Area (VTI):     0.73 cm AV Vmax:           298.50 cm/s AV Vmean:          199.500 cm/s AV VTI:            0.588 m AV Peak Grad:      35.6 mmHg AV Mean Grad:      19.0 mmHg LVOT Vmax:         55.40 cm/s LVOT Vmean:        38.500 cm/s LVOT VTI:          0.113 m LVOT/AV VTI ratio: 0.19  AORTA Ao Root diam: 2.80 cm MV E velocity: 9.36 cm/s                           SHUNTS                           Systemic VTI:  0.11 m                           Systemic Diam: 2.20 cm Jenkins Rouge MD Electronically signed  by Jenkins Rouge MD Signature Date/Time:  09/20/2020/12:05:21 PM    Final         Scheduled Meds: . fluticasone  2 spray Each Nare Daily  . furosemide  40 mg Oral Daily  . ipratropium-albuterol  3 mL Nebulization BID  . metoprolol tartrate  25 mg Oral BID  . polyethylene glycol  17 g Oral Daily  . senna-docusate  1 tablet Oral BID  . simvastatin  20 mg Oral q1800  . tamsulosin  0.4 mg Oral Daily   Continuous Infusions: . heparin 1,500 Units/hr (09/22/20 0530)     LOS: 7 days    Time spent: 25 mins.More than 50% of that time was spent in counseling and/or coordination of care.      Shelly Coss, MD [facility] P5/10/2020, 8:10 AM l;l

## 2020-09-22 NOTE — Telephone Encounter (Signed)
Mr. Vandervelden will likely be discharged on 5/9 after chest tube is pulled.  He has a LUL pulmonary nodule and right sided loculated effusion with bloody drainage with concern for malignant process.  He has an appt with Dr. [name] on 5/16 at [facility].  Can we please arrange for PET CT prior to the appt and notify the patient.  Of note, he has an appt with Cardiology on 5/12.  Please do not hesitate to call me with questions.      [name], MSN, APRN, NP-C, AGACNP-BC  Pulmonary & Critical Care 09/22/2020, 4:29 PM   Please see [email or web address] for pager details.   From 7A-7P if no response, please call [phone] After hours, please call ELink [phone]

## 2020-09-22 NOTE — Discharge Instructions (Signed)

## 2020-09-22 NOTE — Progress Notes (Signed)
Referring Physician(s): Dr. Chase Caller  Supervising Physician: Aletta Edouard  Patient Status:  [facility] - In-pt  Chief Complaint: Dyspnea Loculated right pleural effusion  Subjective: Patient resting comfortably. Family at bedside.  States his breathing has improved. 1000 mL serosanguinous output in PleurEvac.   Allergies: Lisinopril  Medications: Prior to Admission medications   Medication Sig Start Date End Date Taking? Authorizing Provider  albuterol (VENTOLIN HFA) 108 (90 Base) MCG/ACT inhaler INHALE 1 TO 2 PUFFS BY MOUTH EVERY 6 HOURS AS NEEDED FOR WHEEZING FOR SHORTNESS OF BREATH Patient taking differently: Inhale 2 puffs into the lungs every 6 (six) hours as needed for wheezing or shortness of breath. 09/15/20  Yes [name], MD  amLODipine (NORVASC) 5 MG tablet Take 1 tablet (5 mg total) by mouth daily. 11/26/19  Yes [name], MD  aspirin EC 81 MG tablet Take 81 mg by mouth daily.   Yes [provider]  fluticasone (FLONASE) 50 MCG/ACT nasal spray Place 2 sprays into both nostrils daily. 08/04/18  Yes [name], MD  losartan-hydrochlorothiazide (HYZAAR) 100-25 MG tablet Take 1 tablet by mouth daily. 11/26/19  Yes [name], MD  Multiple Vitamin (MULTIVITAMIN WITH MINERALS) TABS tablet Take 1 tablet by mouth daily.   Yes [provider]  simvastatin (ZOCOR) 20 MG tablet Take 1 tablet (20 mg total) by mouth daily at 6 PM. 11/26/19  Yes [name], MD  tamsulosin (FLOMAX) 0.4 MG CAPS capsule Take 0.4 mg by mouth daily. 07/21/20  Yes [provider]     Vital Signs: BP 109/75 (BP Location: Right Arm)   Pulse 79   Temp 97.7 F (36.5 C) (Oral)   Resp (!) 24   Wt 159 lb 6.3 oz (72.3 kg)   SpO2 96%   BMI 22.87 kg/m   Physical Exam  NAD, alert Chest: Right sided chest tube in place.  Serosanguinous output.  Appears thin. No air leak.   Imaging: CT CHEST WO CONTRAST  Result Date:  09/20/2020 CLINICAL DATA:  Pleural effusion, dyspnea EXAM: CT CHEST WITHOUT CONTRAST TECHNIQUE: Multidetector CT imaging of the chest was performed following the standard protocol without IV contrast. COMPARISON:  01/15/2021 FINDINGS: Cardiovascular: Extensive multi-vessel coronary artery calcification is again noted. There is extensive calcification of the aortic valve leaflets. Global cardiac size is within normal limits. No pericardial effusion. The central pulmonary arteries are enlarged in keeping with changes of pulmonary arterial hypertension. There is moderate atherosclerotic calcification within the thoracic aorta. No aortic aneurysm. Mediastinum/Nodes: There is mild mediastinal shift to the right related to right-sided volume loss again noted. Shotty mediastinal adenopathy is stable, possibly reactive in nature. No frankly pathologic thoracic adenopathy identified. The esophagus is unremarkable. The visualized thyroid is unremarkable. Lungs/Pleura: Moderate to large right complex laterally loculated pleural effusion is unchanged in size and position extending from the apex to the right lung base and circumferentially constricting of the right lung. There is pleural thickening identified as well as heterogeneity of the a contents of the pleural effusion suggesting proteinaceous or hemorrhagic material. There is stable subtotal collapse and consolidation of the right lung with a pleural rind again identified. Moderate dependently layering left pleural effusion with compressive atelectasis of the left lower lobe is unchanged. Left apical subpleural paramediastinal nodule is unchanged measuring 2.3 x 1.6 cm at axial image # 34/5. No pneumothorax.  No central obstructing lesion. Upper Abdomen: No acute abnormality. Previously noted subtle hepatic abnormalities are not as well appreciated on  this examination in absence of contrast administration. Musculoskeletal: No lytic or blastic bone lesion identified.  IMPRESSION: Stable examination with moderate to large laterally loculated complex pleural effusion demonstrating heterogeneous internal attenuation suggesting proteinaceous or hemorrhagic material and associated pleural thickening. Stable subtotal collapse and consolidation of the right lung with volume loss and mediastinal shift to the right. Moderate left dependently layering pleural effusion. Indeterminate 2.3 cm soft tissue nodule within the left apex suspicious for a primary bronchogenic neoplasm. PET CT examination may be helpful for further evaluation. Extensive multi-vessel coronary artery calcification. Extensive calcification of the aortic valve leaflets. Morphologic changes in keeping with pulmonary arterial hypertension. Aortic Atherosclerosis (ICD10-I70.0). Electronically Signed   By: Fidela Salisbury MD   On: 09/20/2020 20:30   DG Chest Port 1 View  Result Date: 09/22/2020 CLINICAL DATA:  Chest tube placement on the right EXAM: PORTABLE CHEST 1 VIEW COMPARISON:  Chest radiograph and chest CT Sep 20, 2020 FINDINGS: Chest tube present on the right peripherally. Significantly less pleural effusion on the right following chest tube placement. There remains loculated effusion on the right with areas of airspace opacity throughout the right lung, most notably in the right lower lung region. No appreciable pneumothorax. There is a small left pleural effusion with left base atelectasis. Heart is upper normal in size with pulmonary vascularity normal. No evident adenopathy. IMPRESSION: Chest tube present on the right with significantly less pleural fluid on the right. Areas of loculated effusion with areas of patchy airspace opacity and atelectasis noted on the right, primarily in the right lower lung region. Small left pleural effusion with mild left base atelectasis. Stable cardiac silhouette. Electronically Signed   By: Lowella Grip III M.D.   On: 09/22/2020 08:04   DG CHEST PORT 1 VIEW  Result  Date: 09/20/2020 CLINICAL DATA:  Pleural effusion EXAM: PORTABLE CHEST 1 VIEW COMPARISON:  09/16/2020, CT chest 09/15/2020 FINDINGS: Moderate loculated right pleural effusion and pleural disease without great change. Possible tiny loculated pneumothorax at the right apex also unchanged. Left upper lung lung mass not well demonstrated on radiography. Stable cardiomediastinal silhouette. Small left effusion IMPRESSION: 1. Grossly stable moderate to large loculated right pleural effusion and pleural disease with possible trace loculated pneumothorax at the right apex 2. Similar airspace disease at the right lung base. Probable small left effusion Electronically Signed   By: Donavan Foil M.D.   On: 09/20/2020 18:51   ECHOCARDIOGRAM COMPLETE  Result Date: 09/20/2020    ECHOCARDIOGRAM REPORT   Patient Name:   Adam Briggs Date of Exam: 09/20/2020 Medical Rec #:  [id number]     Height:       70.0 in Accession #:    [id number]    Weight:       157.2 lb Date of Birth:  12-07-1929     BSA:          1.884 m Patient Age:    87 years      BP:           102/67 mmHg Patient Gender: M             HR:           80 bpm. Exam Location:  Inpatient Procedure: 2D Echo Indications:    Atrial Fibrillation  History:        Patient has prior history of Echocardiogram examinations, most                 recent 09/16/2020. Signs/Symptoms:Murmur; Risk  Factors:Hypertension and Diabetes.  Sonographer:    Mikki Santee RDCS (AE) Referring Phys: [id number] Darreld Mclean  Sonographer Comments: Technically difficult study due to poor echo windows, suboptimal apical window and suboptimal subcostal window. IMPRESSIONS  1. As with prior echo 09/16/20 poor quality images LV has abnormal septal motion unable to assess RWMA;s consider TEE if clinically indicated to also evaluate severity of AS. Left ventricular ejection fraction, by estimation, is ? 50-55%%. The left ventricle has mildly decreased function. Left ventricular endocardial  border not optimally defined to evaluate regional wall motion. There is mild left ventricular hypertrophy. Left ventricular diastolic parameters are indeterminate.  2. Right ventricular systolic function was not well visualized. The right ventricular size is not well visualized.  3. The mitral valve was not well visualized. Trivial mitral valve regurgitation.  4. Poorly visualized some calcium seen mean gradient 19 peak 37 mmHg likely moderate AS. Consider TEE if clinically indicated . The aortic valve was not well visualized. Aortic valve regurgitation is not visualized. Moderate aortic valve stenosis.  5. Pulmonic valve regurgitation poorly seen. FINDINGS  Left Ventricle: As with prior echo 09/16/20 poor quality images LV has abnormal septal motion unable to assess RWMA;s consider TEE if clinically indicated to also evaluate severity of AS. Left ventricular ejection fraction, by estimation, is ? 50-55%%. The left ventricle has mildly decreased function. Left ventricular endocardial border not optimally defined to evaluate regional wall motion. The left ventricular internal cavity size was normal in size. There is mild left ventricular hypertrophy. Left ventricular diastolic parameters are indeterminate. Right Ventricle: The right ventricular size is not well visualized. Right vetricular wall thickness was not well visualized. Right ventricular systolic function was not well visualized. Left Atrium: Left atrial size was normal in size. Right Atrium: Right atrial size was not well visualized. Pericardium: There is no evidence of pericardial effusion. Mitral Valve: The mitral valve was not well visualized. Trivial mitral valve regurgitation. Tricuspid Valve: The tricuspid valve is normal in structure. Tricuspid valve regurgitation is mild. Aortic Valve: Poorly visualized some calcium seen mean gradient 19 peak 37 mmHg likely moderate AS. Consider TEE if clinically indicated. The aortic valve was not well visualized.  Aortic valve regurgitation is not visualized. Moderate aortic stenosis is present. Aortic valve mean gradient measures 19.0 mmHg. Aortic valve peak gradient measures 35.6 mmHg. Aortic valve area, by VTI measures 0.73 cm. Pulmonic Valve: The pulmonic valve was not well visualized. Pulmonic valve regurgitation poorly seen. Aorta: The aortic root was not well visualized. IAS/Shunts: The interatrial septum was not well visualized.  LEFT VENTRICLE PLAX 2D LVIDd:         4.17 cm  Diastology LVIDs:         2.59 cm  LV e' medial:    9.36 cm/s LV PW:         1.23 cm  LV E/e' medial:  1.0 LV IVS:        0.91 cm  LV e' lateral:   9.68 cm/s LVOT diam:     2.20 cm  LV E/e' lateral: 1.0 LV SV:         43 LV SV Index:   23 LVOT Area:     3.80 cm  LEFT ATRIUM           Index       RIGHT ATRIUM           Index LA diam:      3.10 cm 1.65 cm/m  RA Area:  20.90 cm LA Vol (A2C): 69.7 ml 36.99 ml/m RA Volume:   61.60 ml  32.69 ml/m  AORTIC VALVE AV Area (Vmax):    0.71 cm AV Area (Vmean):   0.73 cm AV Area (VTI):     0.73 cm AV Vmax:           298.50 cm/s AV Vmean:          199.500 cm/s AV VTI:            0.588 m AV Peak Grad:      35.6 mmHg AV Mean Grad:      19.0 mmHg LVOT Vmax:         55.40 cm/s LVOT Vmean:        38.500 cm/s LVOT VTI:          0.113 m LVOT/AV VTI ratio: 0.19  AORTA Ao Root diam: 2.80 cm MV E velocity: 9.36 cm/s                           SHUNTS                           Systemic VTI:  0.11 m                           Systemic Diam: 2.20 cm Jenkins Rouge MD Electronically signed by Jenkins Rouge MD Signature Date/Time: 09/20/2020/12:05:21 PM    Final    CT IMAGE GUIDED FLUID DRAIN BY CATHETER  Result Date: 09/22/2020 CLINICAL DATA:  Hypoxic respiratory failure, loculated right effusion EXAM: CT GUIDED CHEST DRAIN PLACEMENT ANESTHESIA/SEDATION: Intravenous Fentanyl 38mcg and Versed 1mg  were administered as conscious sedation during continuous monitoring of the patient's level of consciousness and  physiological / cardiorespiratory status by the radiology RN, with a total moderate sedation time of 17 minutes. PROCEDURE: The procedure, risks, benefits, and alternatives were explained to the patient. Questions regarding the procedure were encouraged and answered. The patient understands and consents to the procedure. Select axial scans through the thorax were obtained. The loculated right effusion was localized and an appropriate skin entry site was determined and marked. The operative field was prepped with chlorhexidinein a sterile fashion, and a sterile drape was applied covering the operative field. A sterile gown and sterile gloves were used for the procedure. Local anesthesia was provided with 1% Lidocaine. Under CT fluoroscopic guidance, 18 gauge percutaneous entry needle advanced into the collection. Blood-tinged fluid could be aspirated. Amplatz wire advanced easily, position confirmed on CT. Tract dilated to facilitate placement 14 French pigtail drain catheter. Follow-up CT shows good position in the dependent aspect of the loculated right effusion. 20 mL of the blood tinged aspirate were sent for Gram stain and culture. The catheter was secured externally with 0 Prolene suture and StatLock, covered with Vaseline gauze and sterile gauze dressing, and placed to -20 cm H2O suction canister. COMPLICATIONS: None immediate FINDINGS: Loculated right lateral pleural fluid collection was localized. 14 French pigtail drain catheter placed as above. 20 mL of the blood tinged aspirate were sent for Gram stain and culture. IMPRESSION: 1. Technically successful CT-guided right chest drain placement. Electronically Signed   By: Lucrezia Europe M.D.   On: 09/22/2020 09:53    Labs:  CBC: Recent Labs    09/16/20 0359 09/18/20 1032 09/20/20 1236 09/21/20 0440 09/22/20 0445  WBC 5.2 5.0  --  5.1  5.6  HGB 12.7* 12.0* 11.6* 11.0* 11.7*  HCT 40.1 39.2 38.3* 36.0* 38.2*  PLT 190 215  --  188 156     COAGS: Recent Labs    09/15/20 2047  INR 1.1    BMP: Recent Labs    09/15/20 1550 09/16/20 0359 09/18/20 1032 09/21/20 0440  NA 138 138 136 139  K 3.7 3.8 3.7 3.7  CL 100 99 96* 101  CO2 30 32 32 26  GLUCOSE 128* 126* 122* 104*  BUN 26* 21 31* 27*  CALCIUM 9.3 8.8* 8.8* 8.7*  CREATININE 0.95 0.90 1.11 0.82  GFRNONAA >60 >60 >60 >60    LIVER FUNCTION TESTS: Recent Labs    11/26/19 1549 09/15/20 1132 09/15/20 1550 09/15/20 2047 09/16/20 0359  BILITOT 0.5 0.6 0.5  --  0.5  AST 12 11 15   --  16  ALT 9 12 15   --  13  ALKPHOS  --  59 62  --  55  PROT 6.3 6.7 7.1 6.6 6.1*  ALBUMIN  --  3.9 3.9 3.6 3.3*    Assessment and Plan: Right pleural effusion s/p right-sided chest tube placement 5/5 by Dr. Vernard Gambles Patient resting comfortably in bed today.  Reports improvement in his breathing.  Chest tube intact.  Serosanguinous fluid, appears thin.  CXR shows reduced fluid collection with ongoing areas of locuation.  CCM managing. Note plans for tube to remain for now with removal soon and possible d/c early next week. IR following.   Electronically Signed: Docia Barrier, PA 09/22/2020, 4:52 PM   I spent a total of 15 Minutes at the the patient's bedside AND on the patient's hospital floor or unit, greater than 50% of which was counseling/coordinating care for R pleural effusion.

## 2020-09-22 NOTE — Care Management Important Message (Signed)
Important Message  Patient Details IM Letter given to the Patient. Name: Adam Briggs MRN: [id number] Date of Birth: 1929-12-08   Medicare Important Message Given:  Yes     Kerin Salen 09/22/2020, 12:00 PM

## 2020-09-22 NOTE — Progress Notes (Addendum)
NAME:  Adam Briggs, MRN:  [id number], DOB:  1929-10-16, LOS: 7 ADMISSION DATE:  09/15/2020, CONSULTATION DATE:  4/29 REFERRING MD:  Maralyn Sago, CHIEF COMPLAINT:  Pleural effusion and dyspnea   History of Present Illness:  87 year old male, very active, who presented to ED on 4/29 w/ about 1 month h/o progressive SOB & LE edema. Went to PCP who sent to ER for further eval.  In the ER CBC w/out leukocytosis, BNP 238, trop neg, Lactic acid 1.1, CT chest showed large loculated right effusion w/ R lateral pleural thickening, and RLL consolidation, and LUL lung mass w/ mediastinal involvement and small left effusion. There was also small low density foci w/in the right liver. Because of this PCCM was consulted.   Pertinent  Medical History  HTN, Hyperlipidemia, aortic stenosis, BPH, Diabetes, GERD, Prostate cancer s/p seed implant, inguinal hernia   Significant Hospital Events: Including procedures, antibiotic start and stop dates in addition to other pertinent events   . 4/29 Pulm consulted for pleural effusion and dyspnea. Thora completed LD 134 (exudative by LDH), protein <3, TNC 339. CXR post thora with residual loculated effusion  . 5/04 On RA, pathology returned with no malignant cells   Interim History / Subjective:  Pt reports feeling better > like he can get a better breath since fluid drainage  Denies chest pain / discomfort from chest tube   Objective   Blood pressure 109/75, pulse 79, temperature 97.7 F (36.5 C), temperature source Oral, resp. rate (!) 24, weight 72.3 kg, SpO2 96 %.        Intake/Output Summary (Last 24 hours) at 09/22/2020 1547 Last data filed at 09/22/2020 1246 Gross per 24 hour  Intake 804.99 ml  Output 630 ml  Net 174.99 ml   Filed Weights   09/19/20 0500 09/20/20 0542 09/22/20 0421  Weight: 73.3 kg 71.3 kg 72.3 kg    Examination: General: well developed elderly male lying in bed in NAD HEENT: MM pink/moist, pupils =/reactive, anicteric  Neuro:  AAOx4, speech clear, MAE  CV: s1s2 RRR, +SEM murmur (chronic) PULM: non-labored, diminished on right  GI: soft, bsx4 active  Extremities: warm/dry, no edema  Skin: no rashes or lesions   Labs/imaging that I havepersonally reviewed  (right click and "Reselect all SmartList Selections" daily)    Resolved Hospital Problem list   CT post chest tube placement  CBC Pleural fluid  CXR  Assessment & Plan:   Large loculated right effusion, Right lateral pleural thickening vs mass. LUL mass, left sided adenopathy, mod sized left effusion w/ associated dyspnea  Hypodense liver lesions No clear hx of prior pulmonary infection, aspiration.  Former smoker.  S/p thoracentesis with 800 ml pleural fluid removed. Exudate by LDH.  Possible trapped lung post procedure given pain at the end.  Post CXR shows residual loculated fluid.  Cytology negative. On Eliquis at baseline (pt denies chest trauma). IR placed chest tube on 5/5 with 1.1L bloody fluid drainage.    -repeat cytology 5/6  -follow chest tube drainage (thus far has had 1.1L bloody fluid out, sahara turned over at some point) -no role for tPA / dornase in malignant effusion  -reviewed CXR with family, updated Granddaughter who is an primary care NP via phone -LUL mass would likely be difficult to reach, PET will offer possible other locations for biopsy -if drainage slowed, consider removal of chest tube 5/9 -will need outpatient PET CT to follow up liver hypodensities, LUL lung mass to direct  target for possible biopsy -follow up with Pulmonary post PET CT > arranged for 5/16 at 3pm with Dr. [name]  -pulmonary hygiene -IS, mobilize     [name], MSN, APRN, NP-C, AGACNP-BC [facility] 09/22/2020, 3:47 PM   Please see [email or web address] for pager details.   From 7A-7P if no response, please call [phone] After hours, please call ELink [phone]

## 2020-09-23 DIAGNOSIS — I350 Nonrheumatic aortic (valve) stenosis: Secondary | ICD-10-CM | POA: Diagnosis not present

## 2020-09-23 DIAGNOSIS — I10 Essential (primary) hypertension: Secondary | ICD-10-CM | POA: Diagnosis not present

## 2020-09-23 DIAGNOSIS — I4891 Unspecified atrial fibrillation: Secondary | ICD-10-CM | POA: Diagnosis not present

## 2020-09-23 DIAGNOSIS — R0602 Shortness of breath: Secondary | ICD-10-CM | POA: Diagnosis not present

## 2020-09-23 LAB — GLUCOSE, CAPILLARY
Glucose-Capillary: 100 mg/dL — ABNORMAL HIGH (ref 70–99)
Glucose-Capillary: 112 mg/dL — ABNORMAL HIGH (ref 70–99)
Glucose-Capillary: 151 mg/dL — ABNORMAL HIGH (ref 70–99)
Glucose-Capillary: 133 mg/dL — ABNORMAL HIGH (ref 70–99)

## 2020-09-23 NOTE — Progress Notes (Signed)
PROGRESS NOTE    Adam Briggs  IEP:[id number] DOB: 1929/10/29 DOA: 09/15/2020 PCP: [name], MD   Chief Complain: Dyspnea  Brief Narrative: Patient is a 87 year old male with a past medical history of hypertension, dyslipidemia, aortic stenosis, who presents to the emergency department on 4/22 with complaints of dyspnea, generalized weakness.  He was seen at his cardiology office where he was found to have pleural effusion, cardiomegaly and he was recommended to go to the emergency department.  On presentation he was tachycardic but saturating fine on room air.  X-ray showed large loculated right pleural effusion.  CT chest confirmed loculated right pleural effusion with irregular pleural thickening suspicious for neoplastic process, moderate left pleural effusion.  PCCM consulted and he underwent thoracentesis.  Pleural cytology was nonconclusive, did not show any malignant cells.Hospital course remarkable for persistent new onset A. fib with RVR, cardiology were consulted.Underwent  right  chest tube placement by IR on 09/21/20.    Assessment & Plan:   Active Problems:   Essential hypertension   Moderate aortic stenosis   SOB (shortness of breath)   Pleural effusion, right   Acute respiratory failure with hypoxia (HCC)   Mass of upper lobe of left lung   Atrial fibrillation, new onset (HCC)   Atrial fibrillation (HCC)   S/P thoracentesis   Acute hypoxic respiratory failure: Secondary to bilateral pleural effusion.  Currently on room air.  Continue supplemental oxygen as needed.  Bilateral pleural effusion: X-ray showed large loculated right pleural effusion.  CT chest confirmed loculated right pleural effusion with irregular pleural thickening suspicious for neoplastic process, moderate left pleural effusion.  PCCM consulted and he underwent thoracentesis.  Pleural fluid cytology report inconclusive/negative for malignant cells. CT chest done on 09/20/2020 showed stable  moderate to large laterally loculated complex pleural effusion demonstrating heterogeneous internal attenuation suggesting proteinaceous or hemorrhagic material andassociated pleural thickening,stable subtotal collapse and consolidation of the right lung with volume loss and mediastinal shift to the right., possible left moderate pleural effusion. S/P right chest tube placement by IR on 09/21/20 Follow-up chest x-ray done on 09/22/20 showed significantly less pleural fluid on the right,areas of loculated effusion with areas of patchy airspace opacity and atelectasis .  On Lasix daily.  Left upper lobe lung mass/right lung lesion:Follow CT has again shown ndeterminate 2.3 cm soft tissue nodule within the left apex suspicious for a primary bronchogenic neoplasm. Plan for PET CT as an outpatient.PCCM following.  He will follow-up with Dr. [name] on 5/16  New onset A. fib: On presentation.  Monitor on telemetry.  Requested  cardiology evaluation for decision on starting anticoagulation on this elderly male with multiple problems. CHA2DS2VASc score of 3.  Started on heparin drip , now changed to Eliquis.  Also started on Lopressor for rate control. TSH normal.  Currently rate is controlled.  Aortic stenosis: Diagnosed with aortic stenosis but has not followed up last 7 years.  Echo showed moderate aortic stenosis  Hypertension: Continue home Norvasc,metoprolol.  Home losartan/hydrochlorothiazide was held.  Currently blood pressure stable.  Hyperlipidemia: Continue statin         DVT prophylaxis:Eliquis Code Status: Full Family Communication: daughter at bedside on 09/22/20 Status is: Inpatient  Remains inpatient appropriate because:Inpatient level of care appropriate due to severity of illness   Dispo: The patient is from: Home              Anticipated d/c is to: Home  Patient currently is not medically stable to d/c.   Difficult to place patient No     Consultants:  PCCM  Procedures:Thoracentesisdfgd  Antimicrobials:  Anti-infectives (From admission, onward)   Start     Dose/Rate Route Frequency Ordered Stop   09/21/20 1552  ceFAZolin (ANCEF) 2-4 GM/100ML-% IVPB  Status:  Discontinued       Note to Pharmacy: Lesia Hausen   : cabinet override      09/21/20 1552 09/21/20 1631      Subjective:  Patient seen and examined the bedside this morning.  Hemodynamically stable.  Comfortable.  Not requiring any oxygen.  Right-sided chest tube still draining some sanguinous fluid.  Objective: Vitals:   09/22/20 1235 09/22/20 2004 09/22/20 2105 09/23/20 0539  BP: 109/75  105/70 103/67  Pulse: 79  72 72  Resp: (!) 24  20 18   Temp: 97.7 F (36.5 C)  98 F (36.7 C) (!) 97.5 F (36.4 C)  TempSrc: Oral  Oral Oral  SpO2: 96% 96% 95% 94%  Weight:        Intake/Output Summary (Last 24 hours) at 09/23/2020 0805 Last data filed at 09/23/2020 0230 Gross per 24 hour  Intake 720 ml  Output 280 ml  Net 440 ml   Filed Weights   09/19/20 0500 09/20/20 0542 09/22/20 0421  Weight: 73.3 kg 71.3 kg 72.3 kg    Examination:  General exam: Overall comfortable, not in distress HEENT: PERRL Respiratory system: Diminished air sound bilaterally, no wheezes or crackles Cardiovascular system: S1 & S2 heard, RRR.  Gastrointestinal system: Abdomen is nondistended, soft and nontender. Central nervous system: Alert and oriented Extremities: No edema, no clubbing ,no cyanosis Skin: No rashes, no ulcers,no icterus   Data Reviewed: I have personally reviewed following labs and imaging studies  CBC: Recent Labs  Lab 09/18/20 1032 09/20/20 1236 09/21/20 0440 09/22/20 0445  WBC 5.0  --  5.1 5.6  NEUTROABS 3.9  --  3.5  --   HGB 12.0* 11.6* 11.0* 11.7*  HCT 39.2 38.3* 36.0* 38.2*  MCV 82.7  --  83.3 84.1  PLT 215  --  188 623   Basic Metabolic Panel: Recent Labs  Lab 09/18/20 1032 09/21/20 0440  NA 136 139  K 3.7 3.7  CL 96* 101  CO2 32 26  GLUCOSE 122*  104*  BUN 31* 27*  CREATININE 1.11 0.82  CALCIUM 8.8* 8.7*   GFR: Estimated Creatinine Clearance: 62.5 mL/min (by C-G formula based on SCr of 0.82 mg/dL). Liver Function Tests: No results for input(s): AST, ALT, ALKPHOS, BILITOT, PROT, ALBUMIN in the last 168 hours. No results for input(s): LIPASE, AMYLASE in the last 168 hours. No results for input(s): AMMONIA in the last 168 hours. Coagulation Profile: No results for input(s): INR, PROTIME in the last 168 hours. Cardiac Enzymes: No results for input(s): CKTOTAL, CKMB, CKMBINDEX, TROPONINI in the last 168 hours. BNP (last 3 results) Recent Labs    09/15/20 1132  PROBNP 238.0*   HbA1C: No results for input(s): HGBA1C in the last 72 hours. CBG: Recent Labs  Lab 09/22/20 0725 09/22/20 1130 09/22/20 1639 09/22/20 2059 09/23/20 0722  GLUCAP 97 98 96 127* 100*   Lipid Profile: No results for input(s): CHOL, HDL, LDLCALC, TRIG, CHOLHDL, LDLDIRECT in the last 72 hours. Thyroid Function Tests: No results for input(s): TSH, T4TOTAL, FREET4, T3FREE, THYROIDAB in the last 72 hours. Anemia Panel: No results for input(s): VITAMINB12, FOLATE, FERRITIN, TIBC, IRON, RETICCTPCT in the last 72 hours. Sepsis  Labs: No results for input(s): PROCALCITON, LATICACIDVEN in the last 168 hours.  Recent Results (from the past 240 hour(s))  Resp Panel by RT-PCR (Flu A&B, Covid) Nasopharyngeal Swab     Status: None   Collection Time: 09/15/20  3:43 PM   Specimen: Nasopharyngeal Swab; Nasopharyngeal(NP) swabs in vial transport medium  Result Value Ref Range Status   SARS Coronavirus 2 by RT PCR NEGATIVE NEGATIVE Final    Comment: (NOTE) SARS-CoV-2 target nucleic acids are NOT DETECTED.  The SARS-CoV-2 RNA is generally detectable in upper respiratory specimens during the acute phase of infection. The lowest concentration of SARS-CoV-2 viral copies this assay can detect is 138 copies/mL. A negative result does not preclude SARS-Cov-2 infection  and should not be used as the sole basis for treatment or other patient management decisions. A negative result may occur with  improper specimen collection/handling, submission of specimen other than nasopharyngeal swab, presence of viral mutation(s) within the areas targeted by this assay, and inadequate number of viral copies(<138 copies/mL). A negative result must be combined with clinical observations, patient history, and epidemiological information. The expected result is Negative.  Fact Sheet for Patients:  EntrepreneurPulse.com.au  Fact Sheet for Healthcare Providers:  IncredibleEmployment.be  This test is no t yet approved or cleared by the [place] FDA and  has been authorized for detection and/or diagnosis of SARS-CoV-2 by FDA under an Emergency Use Authorization (EUA). This EUA will remain  in effect (meaning this test can be used) for the duration of the COVID-19 declaration under Section 564(b)(1) of the Act, 21 U.S.C.section 360bbb-3(b)(1), unless the authorization is terminated  or revoked sooner.       Influenza A by PCR NEGATIVE NEGATIVE Final   Influenza B by PCR NEGATIVE NEGATIVE Final    Comment: (NOTE) The Xpert Xpress SARS-CoV-2/FLU/RSV plus assay is intended as an aid in the diagnosis of influenza from Nasopharyngeal swab specimens and should not be used as a sole basis for treatment. Nasal washings and aspirates are unacceptable for Xpert Xpress SARS-CoV-2/FLU/RSV testing.  Fact Sheet for Patients: EntrepreneurPulse.com.au  Fact Sheet for Healthcare Providers: IncredibleEmployment.be  This test is not yet approved or cleared by the [place] FDA and has been authorized for detection and/or diagnosis of SARS-CoV-2 by FDA under an Emergency Use Authorization (EUA). This EUA will remain in effect (meaning this test can be used) for the duration of the COVID-19 declaration  under Section 564(b)(1) of the Act, 21 U.S.C. section 360bbb-3(b)(1), unless the authorization is terminated or revoked.  Performed at [facility], [place] [address] [place] [address], [place], [place] [address]   Blood culture (routine x 2)     Status: None   Collection Time: 09/15/20  3:43 PM   Specimen: BLOOD  Result Value Ref Range Status   Specimen Description   Final    BLOOD LEFT ANTECUBITAL Performed at [facility] [address], [place],  [place] [address]    Special Requests   Final    BOTTLES DRAWN AEROBIC AND ANAEROBIC Blood Culture adequate volume Performed at [facility] [address] [place], [place] [address]    Culture   Final    NO GROWTH 5 DAYS Performed at [facility], [place] [address] [place], [place] [address]    Report Status 09/20/2020 FINAL  Final  Blood culture (routine x 2)     Status: None   Collection Time: 09/15/20  3:48 PM   Specimen: BLOOD  Result Value Ref  Range Status   Specimen Description   Final    BLOOD RIGHT ANTECUBITAL Performed at [place] [address], [place], [place] [address]    Special Requests   Final    BOTTLES DRAWN AEROBIC AND ANAEROBIC Blood Culture adequate volume Performed at [place] [address], [place], [place] [address]    Culture   Final    NO GROWTH 5 DAYS Performed at [facility], [place] [address], [place], [place] [address]    Report Status 09/20/2020 FINAL  Final  Fungus Culture With Stain     Status: None (Preliminary result)   Collection Time: 09/16/20  9:20 AM   Specimen: Pleural, Right; Pleural Fluid  Result Value Ref Range Status   Fungus Stain Final report  Final    Comment: (NOTE) Performed At: [facility] [address] [place], [place] [id number] Rush Farmer MD LZ:[id number]    Fungus (Mycology) Culture PENDING  Incomplete   Fungal Source PLEURAL  Final    Comment:  RIGHT Performed at [facility], [place] [address] [place], [place] [address]   Fungus Culture Result     Status: None   Collection Time: 09/16/20  9:20 AM  Result Value Ref Range Status   Result 1 Comment  Final    Comment: (NOTE) KOH/Calcofluor preparation:  no fungus observed. Performed At: [facility] [place], [place] [id number] Rush Farmer MD QA:[id number]   Body fluid culture w Gram Stain     Status: None   Collection Time: 09/16/20  9:22 AM   Specimen: Pleura  Result Value Ref Range Status   Specimen Description   Final    PLEURAL RIGHT Performed at [employer] [address] [place], [place] [address]    Special Requests   Final    NONE Performed at [facility], [place] [address], [place], [place] [address]    Gram Stain   Final    FEW WBC PRESENT, PREDOMINANTLY MONONUCLEAR NO ORGANISMS SEEN    Culture   Final    NO GROWTH 3 DAYS Performed at [facility] [address], [place], [place] [address]    Report Status 09/20/2020 FINAL  Final  Aerobic/Anaerobic Culture (surgical/deep wound)     Status: None (Preliminary result)   Collection Time: 09/21/20  4:47 PM   Specimen: Pleural Fluid  Result Value Ref Range Status   Specimen Description   Final    PLEURAL DRAIN Performed at [place] [address] [place], [place] [address]    Special Requests   Final    NONE Performed at [facility], [place] [address], [place], [place] [address]    Gram Stain   Final    RARE WBC PRESENT,BOTH PMN AND MONONUCLEAR NO ORGANISMS SEEN    Culture   Final    NO GROWTH < 12 HOURS Performed at [facility], [place] [address], [place], [place] [address]    Report Status PENDING  Incomplete         Radiology Studies: DG Chest Port 1 View  Result Date: 09/22/2020 CLINICAL DATA:  Chest tube placement on the right EXAM: PORTABLE CHEST 1 VIEW  COMPARISON:  Chest radiograph and chest CT Sep 20, 2020 FINDINGS: Chest tube present on the right peripherally. Significantly less pleural effusion on the right following chest tube placement. There remains loculated effusion on the right with areas of  airspace opacity throughout the right lung, most notably in the right lower lung region. No appreciable pneumothorax. There is a small left pleural effusion with left base atelectasis. Heart is upper normal in size with pulmonary vascularity normal. No evident adenopathy. IMPRESSION: Chest tube present on the right with significantly less pleural fluid on the right. Areas of loculated effusion with areas of patchy airspace opacity and atelectasis noted on the right, primarily in the right lower lung region. Small left pleural effusion with mild left base atelectasis. Stable cardiac silhouette. Electronically Signed   By: Lowella Grip III M.D.   On: 09/22/2020 08:04   CT IMAGE GUIDED FLUID DRAIN BY CATHETER  Result Date: 09/22/2020 CLINICAL DATA:  Hypoxic respiratory failure, loculated right effusion EXAM: CT GUIDED CHEST DRAIN PLACEMENT ANESTHESIA/SEDATION: Intravenous Fentanyl 87mcg and Versed 1mg  were administered as conscious sedation during continuous monitoring of the patient's level of consciousness and physiological / cardiorespiratory status by the radiology RN, with a total moderate sedation time of 17 minutes. PROCEDURE: The procedure, risks, benefits, and alternatives were explained to the patient. Questions regarding the procedure were encouraged and answered. The patient understands and consents to the procedure. Select axial scans through the thorax were obtained. The loculated right effusion was localized and an appropriate skin entry site was determined and marked. The operative field was prepped with chlorhexidinein a sterile fashion, and a sterile drape was applied covering the operative field. A sterile gown and sterile gloves were used for  the procedure. Local anesthesia was provided with 1% Lidocaine. Under CT fluoroscopic guidance, 18 gauge percutaneous entry needle advanced into the collection. Blood-tinged fluid could be aspirated. Amplatz wire advanced easily, position confirmed on CT. Tract dilated to facilitate placement 14 French pigtail drain catheter. Follow-up CT shows good position in the dependent aspect of the loculated right effusion. 20 mL of the blood tinged aspirate were sent for Gram stain and culture. The catheter was secured externally with 0 Prolene suture and StatLock, covered with Vaseline gauze and sterile gauze dressing, and placed to -20 cm H2O suction canister. COMPLICATIONS: None immediate FINDINGS: Loculated right lateral pleural fluid collection was localized. 14 French pigtail drain catheter placed as above. 20 mL of the blood tinged aspirate were sent for Gram stain and culture. IMPRESSION: 1. Technically successful CT-guided right chest drain placement. Electronically Signed   By: Lucrezia Europe M.D.   On: 09/22/2020 09:53        Scheduled Meds: . apixaban  5 mg Oral BID  . fluticasone  2 spray Each Nare Daily  . furosemide  40 mg Oral Daily  . ipratropium-albuterol  3 mL Nebulization BID  . metoprolol tartrate  25 mg Oral BID  . polyethylene glycol  17 g Oral Daily  . senna-docusate  1 tablet Oral BID  . simvastatin  20 mg Oral q1800  . tamsulosin  0.4 mg Oral Daily   Continuous Infusions:    LOS: 8 days    Time spent: 25 mins.More than 50% of that time was spent in counseling and/or coordination of care.      Shelly Coss, MD [facility] P5/11/2020, 8:05 AM l;l

## 2020-09-24 DIAGNOSIS — I4891 Unspecified atrial fibrillation: Secondary | ICD-10-CM | POA: Diagnosis not present

## 2020-09-24 DIAGNOSIS — R0602 Shortness of breath: Secondary | ICD-10-CM | POA: Diagnosis not present

## 2020-09-24 DIAGNOSIS — I350 Nonrheumatic aortic (valve) stenosis: Secondary | ICD-10-CM | POA: Diagnosis not present

## 2020-09-24 DIAGNOSIS — I10 Essential (primary) hypertension: Secondary | ICD-10-CM | POA: Diagnosis not present

## 2020-09-24 LAB — GLUCOSE, CAPILLARY
Glucose-Capillary: 105 mg/dL — ABNORMAL HIGH (ref 70–99)
Glucose-Capillary: 123 mg/dL — ABNORMAL HIGH (ref 70–99)
Glucose-Capillary: 96 mg/dL (ref 70–99)
Glucose-Capillary: 98 mg/dL (ref 70–99)

## 2020-09-24 NOTE — Progress Notes (Signed)
PROGRESS NOTE    Adam Briggs  NTZ:[id number] DOB: Feb 16, 1930 DOA: 09/15/2020 PCP: Adam Koch, MD   Chief Complain: Dyspnea  Brief Narrative: Patient is a 87 year old male with a past medical history of hypertension, dyslipidemia, aortic stenosis, who presents to the emergency department on 4/22 with complaints of dyspnea, generalized weakness.  He was seen at his cardiology office where he was found to have pleural effusion, cardiomegaly and he was recommended to go to the emergency department.  On presentation he was tachycardic but saturating fine on room air.  X-ray showed large loculated right pleural effusion.  CT chest confirmed loculated right pleural effusion with irregular pleural thickening suspicious for neoplastic process, moderate left pleural effusion.  PCCM consulted and he underwent thoracentesis.  Pleural cytology was nonconclusive, did not show any malignant cells.Hospital course remarkable for persistent new onset A. fib with RVR, cardiology were consulted.Underwent  right  chest tube placement by IR on 09/21/20.    Assessment & Plan:   Active Problems:   Essential hypertension   Moderate aortic stenosis   SOB (shortness of breath)   Pleural effusion, right   Acute respiratory failure with hypoxia (HCC)   Mass of upper lobe of left lung   Atrial fibrillation, new onset (HCC)   Atrial fibrillation (HCC)   S/P thoracentesis   Acute hypoxic respiratory failure: Secondary to bilateral pleural effusion.  Currently on room air.  Continue supplemental oxygen as needed.  Bilateral pleural effusion: X-ray showed large loculated right pleural effusion.  CT chest confirmed loculated right pleural effusion with irregular pleural thickening suspicious for neoplastic process, moderate left pleural effusion.  PCCM consulted and he underwent thoracentesis.  Pleural fluid cytology report inconclusive/negative for malignant cells. CT chest done on 09/20/2020 showed stable  moderate to large laterally loculated complex pleural effusion demonstrating heterogeneous internal attenuation suggesting proteinaceous or hemorrhagic material andassociated pleural thickening,stable subtotal collapse and consolidation of the right lung with volume loss and mediastinal shift to the right., possible left moderate pleural effusion. S/P right chest tube placement by IR on 09/21/20 Follow-up chest x-ray done on 09/22/20 showed significantly less pleural fluid on the right,areas of loculated effusion with areas of patchy airspace opacity and atelectasis .  On Lasix daily.  Left upper lobe lung mass/right lung lesion:Follow CT has again shown ndeterminate 2.3 cm soft tissue nodule within the left apex suspicious for a primary bronchogenic neoplasm. Plan for PET CT as an outpatient.PCCM following.  He will follow-up with Dr. Valeta Briggs on 5/16  New onset A. fib: On presentation.  Monitor on telemetry.  Requested  cardiology evaluation for decision on starting anticoagulation on this elderly male with multiple problems. CHA2DS2VASc score of 3.  Started on heparin drip , now changed to Eliquis.  Also started on Lopressor for rate control. TSH normal.  Currently rate is controlled.  Aortic stenosis: Diagnosed with aortic stenosis but has not followed up last 7 years.  Echo showed moderate aortic stenosis  Hypertension: Continue home Norvasc,metoprolol.  Home losartan/hydrochlorothiazide was held.  Currently blood pressure stable.  Hyperlipidemia: Continue statin         DVT prophylaxis:Eliquis Code Status: Full Family Communication: daughter at bedside on 09/23/20 Status is: Inpatient  Remains inpatient appropriate because:Inpatient level of care appropriate due to severity of illness   Dispo: The patient is from: Home              Anticipated d/c is to: Home  Patient currently is not medically stable to d/c.   Difficult to place patient No     Consultants:  PCCM  Procedures:Thoracentesisdfgd  Antimicrobials:  Anti-infectives (From admission, onward)   Start     Dose/Rate Route Frequency Ordered Stop   09/21/20 1552  ceFAZolin (ANCEF) 2-4 GM/100ML-% IVPB  Status:  Discontinued       Note to Pharmacy: Lesia Hausen   : cabinet override      09/21/20 1552 09/21/20 1631      Subjective:  Patient seen and examined at bedside this morning.  Hemodynamically stable.  Comfortable.  Chest tube still draining sanguinous fluid.  Denies any shortness of breath or cough  Objective: Vitals:   09/23/20 2030 09/24/20 0504 09/24/20 0925 09/24/20 1039  BP: 99/64 124/72  112/79  Pulse: 99 75  90  Resp: 18     Temp: 98.3 F (36.8 C) 97.7 F (36.5 C)    TempSrc:  Oral    SpO2: 96% 97% 94%   Weight:        Intake/Output Summary (Last 24 hours) at 09/24/2020 1137 Last data filed at 09/24/2020 0700 Gross per 24 hour  Intake 1380 ml  Output 1570 ml  Net -190 ml   Filed Weights   09/19/20 0500 09/20/20 0542 09/22/20 0421  Weight: 73.3 kg 71.3 kg 72.3 kg    Examination:  General exam: Overall comfortable, not in distress HEENT: PERRL Respiratory system:  no wheezes or crackles ,right sided chest tube draining sanguinous fluid Cardiovascular system: S1 & S2 heard, RRR.  Gastrointestinal system: Abdomen is nondistended, soft and nontender. Central nervous system: Alert and oriented Extremities: No edema, no clubbing ,no cyanosis Skin: No rashes, no ulcers,no icterus   Data Reviewed: I have personally reviewed following labs and imaging studies  CBC: Recent Labs  Lab 09/18/20 1032 09/20/20 1236 09/21/20 0440 09/22/20 0445  WBC 5.0  --  5.1 5.6  NEUTROABS 3.9  --  3.5  --   HGB 12.0* 11.6* 11.0* 11.7*  HCT 39.2 38.3* 36.0* 38.2*  MCV 82.7  --  83.3 84.1  PLT 215  --  188 696   Basic Metabolic Panel: Recent Labs  Lab 09/18/20 1032 09/21/20 0440  NA 136 139  K 3.7 3.7  CL 96* 101  CO2 32 26  GLUCOSE 122* 104*  BUN 31* 27*   CREATININE 1.11 0.82  CALCIUM 8.8* 8.7*   GFR: Estimated Creatinine Clearance: 62.5 mL/min (by C-G formula based on SCr of 0.82 mg/dL). Liver Function Tests: No results for input(s): AST, ALT, ALKPHOS, BILITOT, PROT, ALBUMIN in the last 168 hours. No results for input(s): LIPASE, AMYLASE in the last 168 hours. No results for input(s): AMMONIA in the last 168 hours. Coagulation Profile: No results for input(s): INR, PROTIME in the last 168 hours. Cardiac Enzymes: No results for input(s): CKTOTAL, CKMB, CKMBINDEX, TROPONINI in the last 168 hours. BNP (last 3 results) Recent Labs    09/15/20 1132  PROBNP 238.0*   HbA1C: No results for input(s): HGBA1C in the last 72 hours. CBG: Recent Labs  Lab 09/23/20 0722 09/23/20 1137 09/23/20 1619 09/23/20 2030 09/24/20 0736  GLUCAP 100* 112* 151* 133* 98   Lipid Profile: No results for input(s): CHOL, HDL, LDLCALC, TRIG, CHOLHDL, LDLDIRECT in the last 72 hours. Thyroid Function Tests: No results for input(s): TSH, T4TOTAL, FREET4, T3FREE, THYROIDAB in the last 72 hours. Anemia Panel: No results for input(s): VITAMINB12, FOLATE, FERRITIN, TIBC, IRON, RETICCTPCT in the last 72 hours. Sepsis  Labs: No results for input(s): PROCALCITON, LATICACIDVEN in the last 168 hours.  Recent Results (from the past 240 hour(s))  Resp Panel by RT-PCR (Flu A&B, Covid) Nasopharyngeal Swab     Status: None   Collection Time: 09/15/20  3:43 PM   Specimen: Nasopharyngeal Swab; Nasopharyngeal(NP) swabs in vial transport medium  Result Value Ref Range Status   SARS Coronavirus 2 by RT PCR NEGATIVE NEGATIVE Final    Comment: (NOTE) SARS-CoV-2 target nucleic acids are NOT DETECTED.  The SARS-CoV-2 RNA is generally detectable in upper respiratory specimens during the acute phase of infection. The lowest concentration of SARS-CoV-2 viral copies this assay can detect is 138 copies/mL. A negative result does not preclude SARS-Cov-2 infection and should not  be used as the sole basis for treatment or other patient management decisions. A negative result may occur with  improper specimen collection/handling, submission of specimen other than nasopharyngeal swab, presence of viral mutation(s) within the areas targeted by this assay, and inadequate number of viral copies(<138 copies/mL). A negative result must be combined with clinical observations, patient history, and epidemiological information. The expected result is Negative.  Fact Sheet for Patients:  EntrepreneurPulse.com.au  Fact Sheet for Healthcare Providers:  IncredibleEmployment.be  This test is no t yet approved or cleared by the [place] FDA and  has been authorized for detection and/or diagnosis of SARS-CoV-2 by FDA under an Emergency Use Authorization (EUA). This EUA will remain  in effect (meaning this test can be used) for the duration of the COVID-19 declaration under Section 564(b)(1) of the Act, 21 U.S.C.section 360bbb-3(b)(1), unless the authorization is terminated  or revoked sooner.       Influenza A by PCR NEGATIVE NEGATIVE Final   Influenza B by PCR NEGATIVE NEGATIVE Final    Comment: (NOTE) The Xpert Xpress SARS-CoV-2/FLU/RSV plus assay is intended as an aid in the diagnosis of influenza from Nasopharyngeal swab specimens and should not be used as a sole basis for treatment. Nasal washings and aspirates are unacceptable for Xpert Xpress SARS-CoV-2/FLU/RSV testing.  Fact Sheet for Patients: EntrepreneurPulse.com.au  Fact Sheet for Healthcare Providers: IncredibleEmployment.be  This test is not yet approved or cleared by the [place] FDA and has been authorized for detection and/or diagnosis of SARS-CoV-2 by FDA under an Emergency Use Authorization (EUA). This EUA will remain in effect (meaning this test can be used) for the duration of the COVID-19 declaration under Section  564(b)(1) of the Act, 21 U.S.C. section 360bbb-3(b)(1), unless the authorization is terminated or revoked.  Performed at [facility], [place] [address] [place], [place] [address]   Blood culture (routine x 2)     Status: None   Collection Time: 09/15/20  3:43 PM   Specimen: BLOOD  Result Value Ref Range Status   Specimen Description   Final    BLOOD LEFT ANTECUBITAL Performed at [facility] [address], [place], [place] [address]    Special Requests   Final    BOTTLES DRAWN AEROBIC AND ANAEROBIC Blood Culture adequate volume Performed at [facility] [address], [place], [place] [address]    Culture   Final    NO GROWTH 5 DAYS Performed at [facility], [place] [address] [place], [place] [address]    Report Status 09/20/2020 FINAL  Final  Blood culture (routine x 2)     Status: None   Collection Time: 09/15/20  3:48 PM   Specimen: BLOOD  Result Value Ref  Range Status   Specimen Description   Final    BLOOD RIGHT ANTECUBITAL Performed at [place] [address] [place], [place] [address]    Special Requests   Final    BOTTLES DRAWN AEROBIC AND ANAEROBIC Blood Culture adequate volume Performed at [place] [address], [place], [place] [address]    Culture   Final    NO GROWTH 5 DAYS Performed at [facility], [place] [address] [place], [place] [address]    Report Status 09/20/2020 FINAL  Final  Fungus Culture With Stain     Status: None (Preliminary result)   Collection Time: 09/16/20  9:20 AM   Specimen: Pleural, Right; Pleural Fluid  Result Value Ref Range Status   Fungus Stain Final report  Final    Comment: (NOTE) Performed At: [facility] [address] [place], [place] [id number] Rush Farmer MD WY:[id number]    Fungus (Mycology) Culture PENDING  Incomplete   Fungal Source PLEURAL  Final    Comment: RIGHT Performed at [facility], [place] [address], [place], [place] [address]   Fungus Culture Result     Status: None   Collection Time: 09/16/20  9:20 AM  Result Value Ref Range Status   Result 1 Comment  Final    Comment: (NOTE) KOH/Calcofluor preparation:  no fungus observed. Performed At: [facility] [place], [place] [id number] Rush Farmer MD NO:[id number]   Body fluid culture w Gram Stain     Status: None   Collection Time: 09/16/20  9:22 AM   Specimen: Pleura  Result Value Ref Range Status   Specimen Description   Final    PLEURAL RIGHT Performed at [facility] [address], [place], [place] [address]    Special Requests   Final    NONE Performed at [facility], [place] [address] [place], [place] [address]    Gram Stain   Final    FEW WBC PRESENT, PREDOMINANTLY MONONUCLEAR NO ORGANISMS SEEN    Culture   Final    NO GROWTH 3 DAYS Performed at [place] [address] [place], [place] [address]    Report Status 09/20/2020 FINAL  Final  Aerobic/Anaerobic Culture (surgical/deep wound)     Status: None (Preliminary result)   Collection Time: 09/21/20  4:47 PM   Specimen: Pleural Fluid  Result Value Ref Range Status   Specimen Description   Final    PLEURAL DRAIN Performed at [place] [address] [place], [place] [address]    Special Requests   Final    NONE Performed at [facility], [place] [address] [place], [place] [address]    Gram Stain   Final    RARE WBC PRESENT,BOTH PMN AND MONONUCLEAR NO ORGANISMS SEEN    Culture   Final    NO GROWTH 2 DAYS NO ANAEROBES ISOLATED; CULTURE IN PROGRESS FOR 5 DAYS Performed at [facility], [place] [address], [place], [place] [address]    Report Status PENDING  Incomplete         Radiology Studies: No results found.      Scheduled Meds: . apixaban  5 mg Oral BID  . fluticasone  2 spray Each  Nare Daily  . furosemide  40 mg Oral Daily  . ipratropium-albuterol  3 mL Nebulization BID  . metoprolol tartrate  25 mg Oral BID  . polyethylene  glycol  17 g Oral Daily  . senna-docusate  1 tablet Oral BID  . simvastatin  20 mg Oral q1800  . tamsulosin  0.4 mg Oral Daily   Continuous Infusions:    LOS: 9 days    Time spent: 25 mins.More than 50% of that time was spent in counseling and/or coordination of care.      Shelly Coss, MD [facility] P5/12/2020, 11:37 AM l;l

## 2020-09-24 NOTE — Plan of Care (Signed)

## 2020-09-24 NOTE — Progress Notes (Signed)
Referring Physician(s): Dr Dessie Coma Dr Chase Caller  Supervising Physician: Jacqulynn Cadet  Patient Status:  [facility] - In-pt  Chief Complaint:  Loculated right pleural effusion Rt Chest tube placement in IR 09/21/20  Subjective:  Up in bed Feeling better daily OP blood tinged 1600 cc in vac No air leak   Allergies: Lisinopril  Medications: Prior to Admission medications   Medication Sig Start Date End Date Taking? Authorizing Provider  albuterol (VENTOLIN HFA) 108 (90 Base) MCG/ACT inhaler INHALE 1 TO 2 PUFFS BY MOUTH EVERY 6 HOURS AS NEEDED FOR WHEEZING FOR SHORTNESS OF BREATH Patient taking differently: Inhale 2 puffs into the lungs every 6 (six) hours as needed for wheezing or shortness of breath. 09/15/20  Yes [name], MD  amLODipine (NORVASC) 5 MG tablet Take 1 tablet (5 mg total) by mouth daily. 11/26/19  Yes [name], MD  aspirin EC 81 MG tablet Take 81 mg by mouth daily.   Yes [provider]  fluticasone (FLONASE) 50 MCG/ACT nasal spray Place 2 sprays into both nostrils daily. 08/04/18  Yes [name], MD  losartan-hydrochlorothiazide (HYZAAR) 100-25 MG tablet Take 1 tablet by mouth daily. 11/26/19  Yes [name], MD  Multiple Vitamin (MULTIVITAMIN WITH MINERALS) TABS tablet Take 1 tablet by mouth daily.   Yes [provider]  simvastatin (ZOCOR) 20 MG tablet Take 1 tablet (20 mg total) by mouth daily at 6 PM. 11/26/19  Yes [name], MD  tamsulosin (FLOMAX) 0.4 MG CAPS capsule Take 0.4 mg by mouth daily. 07/21/20  Yes [provider]     Vital Signs: BP 112/79   Pulse 90   Temp 97.7 F (36.5 C) (Oral)   Resp 18   Wt 159 lb 6.3 oz (72.3 kg)   SpO2 94%   BMI 22.87 kg/m   Physical Exam Skin:    General: Skin is warm.     Comments: Site of CT c/d/i NT no bleeding OP bloody 1600 cc in pleurvac No air leak No growth  Neurological:     Mental Status: He is alert.      Imaging: CT CHEST WO CONTRAST  Result Date: 09/20/2020 CLINICAL DATA:  Pleural effusion, dyspnea EXAM: CT CHEST WITHOUT CONTRAST TECHNIQUE: Multidetector CT imaging of the chest was performed following the standard protocol without IV contrast. COMPARISON:  01/15/2021 FINDINGS: Cardiovascular: Extensive multi-vessel coronary artery calcification is again noted. There is extensive calcification of the aortic valve leaflets. Global cardiac size is within normal limits. No pericardial effusion. The central pulmonary arteries are enlarged in keeping with changes of pulmonary arterial hypertension. There is moderate atherosclerotic calcification within the thoracic aorta. No aortic aneurysm. Mediastinum/Nodes: There is mild mediastinal shift to the right related to right-sided volume loss again noted. Shotty mediastinal adenopathy is stable, possibly reactive in nature. No frankly pathologic thoracic adenopathy identified. The esophagus is unremarkable. The visualized thyroid is unremarkable. Lungs/Pleura: Moderate to large right complex laterally loculated pleural effusion is unchanged in size and position extending from the apex to the right lung base and circumferentially constricting of the right lung. There is pleural thickening identified as well as heterogeneity of the a contents of the pleural effusion suggesting proteinaceous or hemorrhagic material. There is stable subtotal collapse and consolidation of the right lung with a pleural rind again identified. Moderate dependently layering left pleural effusion with compressive atelectasis of the left lower lobe is unchanged. Left apical subpleural paramediastinal nodule is unchanged measuring 2.3 x 1.6  cm at axial image # 34/5. No pneumothorax.  No central obstructing lesion. Upper Abdomen: No acute abnormality. Previously noted subtle hepatic abnormalities are not as well appreciated on this examination in absence of contrast administration.  Musculoskeletal: No lytic or blastic bone lesion identified. IMPRESSION: Stable examination with moderate to large laterally loculated complex pleural effusion demonstrating heterogeneous internal attenuation suggesting proteinaceous or hemorrhagic material and associated pleural thickening. Stable subtotal collapse and consolidation of the right lung with volume loss and mediastinal shift to the right. Moderate left dependently layering pleural effusion. Indeterminate 2.3 cm soft tissue nodule within the left apex suspicious for a primary bronchogenic neoplasm. PET CT examination may be helpful for further evaluation. Extensive multi-vessel coronary artery calcification. Extensive calcification of the aortic valve leaflets. Morphologic changes in keeping with pulmonary arterial hypertension. Aortic Atherosclerosis (ICD10-I70.0). Electronically Signed   By: Fidela Salisbury MD   On: 09/20/2020 20:30   DG Chest Port 1 View  Result Date: 09/22/2020 CLINICAL DATA:  Chest tube placement on the right EXAM: PORTABLE CHEST 1 VIEW COMPARISON:  Chest radiograph and chest CT Sep 20, 2020 FINDINGS: Chest tube present on the right peripherally. Significantly less pleural effusion on the right following chest tube placement. There remains loculated effusion on the right with areas of airspace opacity throughout the right lung, most notably in the right lower lung region. No appreciable pneumothorax. There is a small left pleural effusion with left base atelectasis. Heart is upper normal in size with pulmonary vascularity normal. No evident adenopathy. IMPRESSION: Chest tube present on the right with significantly less pleural fluid on the right. Areas of loculated effusion with areas of patchy airspace opacity and atelectasis noted on the right, primarily in the right lower lung region. Small left pleural effusion with mild left base atelectasis. Stable cardiac silhouette. Electronically Signed   By: Lowella Grip III M.D.    On: 09/22/2020 08:04   DG CHEST PORT 1 VIEW  Result Date: 09/20/2020 CLINICAL DATA:  Pleural effusion EXAM: PORTABLE CHEST 1 VIEW COMPARISON:  09/16/2020, CT chest 09/15/2020 FINDINGS: Moderate loculated right pleural effusion and pleural disease without great change. Possible tiny loculated pneumothorax at the right apex also unchanged. Left upper lung lung mass not well demonstrated on radiography. Stable cardiomediastinal silhouette. Small left effusion IMPRESSION: 1. Grossly stable moderate to large loculated right pleural effusion and pleural disease with possible trace loculated pneumothorax at the right apex 2. Similar airspace disease at the right lung base. Probable small left effusion Electronically Signed   By: Donavan Foil M.D.   On: 09/20/2020 18:51   CT IMAGE GUIDED FLUID DRAIN BY CATHETER  Result Date: 09/22/2020 CLINICAL DATA:  Hypoxic respiratory failure, loculated right effusion EXAM: CT GUIDED CHEST DRAIN PLACEMENT ANESTHESIA/SEDATION: Intravenous Fentanyl 79mcg and Versed 1mg  were administered as conscious sedation during continuous monitoring of the patient's level of consciousness and physiological / cardiorespiratory status by the radiology RN, with a total moderate sedation time of 17 minutes. PROCEDURE: The procedure, risks, benefits, and alternatives were explained to the patient. Questions regarding the procedure were encouraged and answered. The patient understands and consents to the procedure. Select axial scans through the thorax were obtained. The loculated right effusion was localized and an appropriate skin entry site was determined and marked. The operative field was prepped with chlorhexidinein a sterile fashion, and a sterile drape was applied covering the operative field. A sterile gown and sterile gloves were used for the procedure. Local anesthesia was provided with 1% Lidocaine. Under  CT fluoroscopic guidance, 18 gauge percutaneous entry needle advanced into the  collection. Blood-tinged fluid could be aspirated. Amplatz wire advanced easily, position confirmed on CT. Tract dilated to facilitate placement 14 French pigtail drain catheter. Follow-up CT shows good position in the dependent aspect of the loculated right effusion. 20 mL of the blood tinged aspirate were sent for Gram stain and culture. The catheter was secured externally with 0 Prolene suture and StatLock, covered with Vaseline gauze and sterile gauze dressing, and placed to -20 cm H2O suction canister. COMPLICATIONS: None immediate FINDINGS: Loculated right lateral pleural fluid collection was localized. 14 French pigtail drain catheter placed as above. 20 mL of the blood tinged aspirate were sent for Gram stain and culture. IMPRESSION: 1. Technically successful CT-guided right chest drain placement. Electronically Signed   By: Lucrezia Europe M.D.   On: 09/22/2020 09:53    Labs:  CBC: Recent Labs    09/16/20 0359 09/18/20 1032 09/20/20 1236 09/21/20 0440 09/22/20 0445  WBC 5.2 5.0  --  5.1 5.6  HGB 12.7* 12.0* 11.6* 11.0* 11.7*  HCT 40.1 39.2 38.3* 36.0* 38.2*  PLT 190 215  --  188 156    COAGS: Recent Labs    09/15/20 2047  INR 1.1    BMP: Recent Labs    09/15/20 1550 09/16/20 0359 09/18/20 1032 09/21/20 0440  NA 138 138 136 139  K 3.7 3.8 3.7 3.7  CL 100 99 96* 101  CO2 30 32 32 26  GLUCOSE 128* 126* 122* 104*  BUN 26* 21 31* 27*  CALCIUM 9.3 8.8* 8.8* 8.7*  CREATININE 0.95 0.90 1.11 0.82  GFRNONAA >60 >60 >60 >60    LIVER FUNCTION TESTS: Recent Labs    11/26/19 1549 09/15/20 1132 09/15/20 1550 09/15/20 2047 09/16/20 0359  BILITOT 0.5 0.6 0.5  --  0.5  AST 12 11 15   --  16  ALT 9 12 15   --  13  ALKPHOS  --  59 62  --  55  PROT 6.3 6.7 7.1 6.6 6.1*  ALBUMIN  --  3.9 3.9 3.6 3.3*    Assessment and Plan:  Right chest tube placed in IR 09/21/20 Loculated pleural effusion PCCM following We will follow  Electronically Signed: Lavonia Drafts,  PA-C 09/24/2020, 12:00 PM   I spent a total of 15 Minutes at the the patient's bedside AND on the patient's hospital floor or unit, greater than 50% of which was counseling/coordinating care for right chest tube

## 2020-09-25 ENCOUNTER — Inpatient Hospital Stay (HOSPITAL_COMMUNITY): Payer: Medicare Other

## 2020-09-25 DIAGNOSIS — I350 Nonrheumatic aortic (valve) stenosis: Secondary | ICD-10-CM | POA: Diagnosis not present

## 2020-09-25 DIAGNOSIS — R918 Other nonspecific abnormal finding of lung field: Secondary | ICD-10-CM | POA: Diagnosis not present

## 2020-09-25 DIAGNOSIS — R0602 Shortness of breath: Secondary | ICD-10-CM | POA: Diagnosis not present

## 2020-09-25 DIAGNOSIS — I10 Essential (primary) hypertension: Secondary | ICD-10-CM | POA: Diagnosis not present

## 2020-09-25 DIAGNOSIS — J90 Pleural effusion, not elsewhere classified: Secondary | ICD-10-CM | POA: Diagnosis not present

## 2020-09-25 DIAGNOSIS — I4891 Unspecified atrial fibrillation: Secondary | ICD-10-CM | POA: Diagnosis not present

## 2020-09-25 LAB — BASIC METABOLIC PANEL
Anion gap: 7 (ref 5–15)
BUN: 31 mg/dL — ABNORMAL HIGH (ref 8–23)
CO2: 30 mmol/L (ref 22–32)
Calcium: 8.8 mg/dL — ABNORMAL LOW (ref 8.9–10.3)
Chloride: 100 mmol/L (ref 98–111)
Creatinine, Ser: 1.15 mg/dL (ref 0.61–1.24)
GFR, Estimated: >60 mL/min (ref >60)
Glucose, Bld: 108 mg/dL — ABNORMAL HIGH (ref 70–99)
Potassium: 4 mmol/L (ref 3.5–5.1)
Sodium: 137 mmol/L (ref 135–145)

## 2020-09-25 LAB — CBC WITH DIFFERENTIAL/PLATELET
Abs Immature Granulocytes: 0.02 10*3/uL (ref 0.00–0.07)
Basophils Absolute: 0 10*3/uL (ref 0.0–0.1)
Basophils Relative: 1 %
Eosinophils Absolute: 0.1 10*3/uL (ref 0.0–0.5)
Eosinophils Relative: 3 %
HCT: 37.5 % — ABNORMAL LOW (ref 39.0–52.0)
Hemoglobin: 11.6 g/dL — ABNORMAL LOW (ref 13.0–17.0)
Immature Granulocytes: 0 %
Lymphocytes Relative: 12 %
Lymphs Abs: 0.7 10*3/uL (ref 0.7–4.0)
MCH: 25.4 pg — ABNORMAL LOW (ref 26.0–34.0)
MCHC: 30.9 g/dL (ref 30.0–36.0)
MCV: 82.2 fL (ref 80.0–100.0)
Monocytes Absolute: 0.7 10*3/uL (ref 0.1–1.0)
Monocytes Relative: 12 %
Neutro Abs: 4.1 10*3/uL (ref 1.7–7.7)
Neutrophils Relative %: 72 %
Platelets: 229 10*3/uL (ref 150–400)
RBC: 4.56 MIL/uL (ref 4.22–5.81)
RDW: 16.1 % — ABNORMAL HIGH (ref 11.5–15.5)
WBC: 5.6 10*3/uL (ref 4.0–10.5)
nRBC: 0 % (ref 0.0–0.2)

## 2020-09-25 LAB — GLUCOSE, CAPILLARY
Glucose-Capillary: 106 mg/dL — ABNORMAL HIGH (ref 70–99)
Glucose-Capillary: 126 mg/dL — ABNORMAL HIGH (ref 70–99)
Glucose-Capillary: 127 mg/dL — ABNORMAL HIGH (ref 70–99)
Glucose-Capillary: 129 mg/dL — ABNORMAL HIGH (ref 70–99)

## 2020-09-25 MED ORDER — IPRATROPIUM-ALBUTEROL 0.5-2.5 (3) MG/3ML IN SOLN: 3.0000 mL | RESPIRATORY_TRACT | Status: DC | PRN

## 2020-09-25 NOTE — Plan of Care (Signed)
  Problem: Education: Goal: Knowledge of General Education information will improve Description: Including pain rating scale, medication(s)/side effects and non-pharmacologic comfort measures Outcome: Progressing   Problem: Clinical Measurements: Goal: Will remain free from infection Outcome: Progressing Goal: Respiratory complications will improve Outcome: Progressing   Problem: Activity: Goal: Risk for activity intolerance will decrease Outcome: Progressing   Problem: Skin Integrity: Goal: Risk for impaired skin integrity will decrease Outcome: Progressing

## 2020-09-25 NOTE — Telephone Encounter (Signed)
Order for intinal PET scan was placed and message was routed to [facility] pool for scheduling with request scan be completed by pt's OV on 5/16. Order was placed under Dr. Juline Patch name so results would be sent to him. Routing to Dr. [name] for [place]. Nothing further needed at this time.

## 2020-09-25 NOTE — Care Management Important Message (Signed)
Medicare IM printed remotely for Social Work team to give to the patient. 

## 2020-09-25 NOTE — Progress Notes (Signed)
NAME:  Adam Briggs, MRN:  [id number], DOB:  [birth year], LOS: 45 ADMISSION DATE:  09/15/2020, CONSULTATION DATE:  4/29 REFERRING MD:  Maralyn Sago, CHIEF COMPLAINT:  Pleural effusion and dyspnea   History of Present Illness:  87 year old male, very active, who presented to ED on 4/29 w/ about 1 month h/o progressive SOB & LE edema. Went to PCP who sent to ER for further eval.  In the ER CBC w/out leukocytosis, BNP 238, trop neg, Lactic acid 1.1, CT chest showed large loculated right effusion w/ R lateral pleural thickening, and RLL consolidation, and LUL lung mass w/ mediastinal involvement and small left effusion. There was also small low density foci w/in the right liver. Because of this PCCM was consulted.   Pertinent  Medical History  HTN, Hyperlipidemia, aortic stenosis, BPH, Diabetes, GERD, Prostate cancer s/p seed implant, inguinal hernia   Significant Hospital Events: Including procedures, antibiotic start and stop dates in addition to other pertinent events   . 4/29 Pulm consulted for pleural effusion and dyspnea . 5/04 On RA, pathology returned with no malignant cells; cardiology sign off . 5/05 Rt pig tail catheter placed by IR  Tests:   CT chest 4/29 >> loculated Rt pleural effusion, pleural thickening at Rt lateral lung base, mass like consolidation RLL likely rounded ATX, 2 cm irregular mass LUL, low density foci in Rt liver concerning for metastatic lesions, splenomegaly  Rt pleural fluid 4/30 >> glucose 91, LDH 134, protein < 3, WBC 339 (92% lymphocytes), cytology negative for malignancy  CT chest 5/04 >> moderate to large Rt complex loculated effusion, pleural thickening, LUL mass 2.3 x 1.6 cm  Echo 5/04 >> EF 50 to 55%, mild LVH, mod AS  Interim History / Subjective:  Breathing better.  No chest pain.   Chest tube kinked this morning.  Had 140 ml out.  Objective   Blood pressure 104/70, pulse 95, temperature 98.5 F (36.9 C), temperature source Oral, resp. rate 16,  weight 70.6 kg, SpO2 97 %.        Intake/Output Summary (Last 24 hours) at 09/25/2020 1432 Last data filed at 09/25/2020 0700 Gross per 24 hour  Intake 720 ml  Output 1740 ml  Net -1020 ml   Filed Weights   09/20/20 0542 09/22/20 0421 09/25/20 0300  Weight: 71.3 kg 72.3 kg 70.6 kg    Examination:  General - alert Eyes - pupils reactive ENT - no sinus tenderness, no stridor Cardiac - regular rate/rhythm, no murmur Chest - decreased BS on Rt lateral chest, chest tube in place with red colored fluid Abdomen - soft, non tender, + bowel sounds Extremities - no cyanosis, clubbing, or edema Skin - no rashes Neuro - normal strength, moves extremities, follows commands Psych - normal mood and behavior   Assessment & Plan:   Large loculated right effusion, Right lateral pleural thickening vs mass. LUL mass, left sided adenopathy, mod sized left effusion w/ associated dyspnea  Hypodense liver lesions Discussion: Exudate by LDH criteria.  Cytology negative for malignancy, and culture negative.  Appears to have trapped lung.  No evidence for infection so far. Plan: - repeat cytology ordered on 5/06 >> will need to make sure additional fluid sample sent - continue chest tube to water seal and monitor fluid outpt - f/u chest xray 5/10 - will need to have PET scan as outpt to assess Lt upper lobe lesion, liver lesions, and pleural thickening - has outpt pulmonary followed up scheduled with Dr.  Icard on 10/02/20 at 3 pm  New onset atrial fibrillation. Moderate aortic stenosis. - per primary team  Updated pt's daughter at bedside.  Signature:  Chesley Mires, MD [place] Pager - [phone] 09/25/2020, 2:50 PM

## 2020-09-25 NOTE — Progress Notes (Signed)
Pt experienced  Afib-1.8 sec pause. MD notified. Pt asymptomatic. Will continue to assess.

## 2020-09-25 NOTE — Progress Notes (Signed)
PROGRESS NOTE    Adam Briggs  RSW:[id number] DOB: 01-24-1930 DOA: 09/15/2020 PCP: [name], MD   Chief Complain: Dyspnea  Brief Narrative: Patient is a 87 year old male with a past medical history of hypertension, dyslipidemia, aortic stenosis, who presents to the emergency department on 4/22 with complaints of dyspnea, generalized weakness.  He was seen at his cardiology office where he was found to have pleural effusion, cardiomegaly and he was recommended to go to the emergency department.  On presentation he was tachycardic but saturating fine on room air.  X-ray showed large loculated right pleural effusion.  CT chest confirmed loculated right pleural effusion with irregular pleural thickening suspicious for neoplastic process, moderate left pleural effusion.  PCCM consulted and he underwent thoracentesis.  Pleural cytology was nonconclusive, did not show any malignant cells.Hospital course remarkable for persistent new onset A. fib with RVR, cardiology were consulted.Underwent  right  chest tube placement by IR on 09/21/20.  Waiting for PCCM recommendation about chest tube management/discharge  Assessment & Plan:   Active Problems:   Essential hypertension   Moderate aortic stenosis   SOB (shortness of breath)   Pleural effusion, right   Acute respiratory failure with hypoxia (HCC)   Mass of upper lobe of left lung   Atrial fibrillation, new onset (HCC)   Atrial fibrillation (HCC)   S/P thoracentesis   Acute hypoxic respiratory failure: Secondary to bilateral pleural effusion.  Currently on room air.  Continue supplemental oxygen as needed.  Bilateral pleural effusion: X-ray showed large loculated right pleural effusion.  CT chest confirmed loculated right pleural effusion with irregular pleural thickening suspicious for neoplastic process, moderate left pleural effusion.  PCCM consulted and he underwent thoracentesis.  Pleural fluid cytology report  inconclusive/negative for malignant cells. CT chest done on 09/20/2020 showed stable moderate to large laterally loculated complex pleural effusion demonstrating heterogeneous internal attenuation suggesting proteinaceous or hemorrhagic material andassociated pleural thickening,stable subtotal collapse and consolidation of the right lung with volume loss and mediastinal shift to the right., possible left moderate pleural effusion. S/P right chest tube placement by IR on 09/21/20 Follow-up chest x-ray done on 09/22/20 showed significantly less pleural fluid on the right,areas of loculated effusion with areas of patchy airspace opacity and atelectasis .  On Lasix daily.  Left upper lobe lung mass/right lung lesion:Follow CT has again shown ndeterminate 2.3 cm soft tissue nodule within the left apex suspicious for a primary bronchogenic neoplasm. Plan for PET CT as an outpatient.PCCM following.  He will follow-up with Dr. [name] on 5/16  New onset A. fib: On presentation.  Monitor on telemetry.  Requested  cardiology evaluation for decision on starting anticoagulation on this elderly male with multiple problems. CHA2DS2VASc score of 3.  Started on heparin drip , now changed to Eliquis.  Also started on Lopressor for rate control. TSH normal.  Currently rate is controlled.  Aortic stenosis: Diagnosed with aortic stenosis but has not followed up last 7 years.  Echo showed moderate aortic stenosis  Hypertension: Continue home Norvasc,metoprolol.  Home losartan/hydrochlorothiazide was held.  Currently blood pressure stable.  Hyperlipidemia: Continue statin         DVT prophylaxis:Eliquis Code Status: Full Family Communication: daughter at bedside on 09/23/20 Status is: Inpatient  Remains inpatient appropriate because:Inpatient level of care appropriate due to severity of illness   Dispo: The patient is from: Home              Anticipated d/c is to: Home  Patient currently is not  medically stable to d/c.   Difficult to place patient No     Consultants: PCCM  Procedures:Thoracentesis  Antimicrobials:  Anti-infectives (From admission, onward)   Start     Dose/Rate Route Frequency Ordered Stop   09/21/20 1552  ceFAZolin (ANCEF) 2-4 GM/100ML-% IVPB  Status:  Discontinued       Note to Pharmacy: Lesia Hausen   : cabinet override      09/21/20 1552 09/21/20 1631      Subjective:  Patient seen and examined at the bedside this morning.  Hemodynamically stable.  Comfortable.  On room air.  No new complaints.  Chest tube still draining sanguinous fluid.  Objective: Vitals:   09/25/20 0300 09/25/20 0430 09/25/20 0840 09/25/20 1037  BP:  115/72  110/70  Pulse:  77  82  Resp:      Temp:  98.2 F (36.8 C)    TempSrc:  Oral    SpO2:  96% 96%   Weight: 70.6 kg       Intake/Output Summary (Last 24 hours) at 09/25/2020 1148 Last data filed at 09/25/2020 0700 Gross per 24 hour  Intake 1080 ml  Output 1740 ml  Net -660 ml   Filed Weights   09/20/20 0542 09/22/20 0421 09/25/20 0300  Weight: 71.3 kg 72.3 kg 70.6 kg    Examination:  General exam: Overall comfortable, not in distress HEENT: PERRL Respiratory system:  no wheezes or crackles.  Diminished air sounds on the right side, right-sided chest tube Cardiovascular system: S1 & S2 heard, RRR.  Gastrointestinal system: Abdomen is nondistended, soft and nontender. Central nervous system: Alert and oriented Extremities: No edema, no clubbing ,no cyanosis Skin: No rashes, no ulcers,no icterus   Data Reviewed: I have personally reviewed following labs and imaging studies  CBC: Recent Labs  Lab 09/20/20 1236 09/21/20 0440 09/22/20 0445 09/25/20 0431  WBC  --  5.1 5.6 5.6  NEUTROABS  --  3.5  --  4.1  HGB 11.6* 11.0* 11.7* 11.6*  HCT 38.3* 36.0* 38.2* 37.5*  MCV  --  83.3 84.1 82.2  PLT  --  188 156 626   Basic Metabolic Panel: Recent Labs  Lab 09/21/20 0440 09/25/20 0431  NA 139 137  K 3.7  4.0  CL 101 100  CO2 26 30  GLUCOSE 104* 108*  BUN 27* 31*  CREATININE 0.82 1.15  CALCIUM 8.7* 8.8*   GFR: Estimated Creatinine Clearance: 43.5 mL/min (by C-G formula based on SCr of 1.15 mg/dL). Liver Function Tests: No results for input(s): AST, ALT, ALKPHOS, BILITOT, PROT, ALBUMIN in the last 168 hours. No results for input(s): LIPASE, AMYLASE in the last 168 hours. No results for input(s): AMMONIA in the last 168 hours. Coagulation Profile: No results for input(s): INR, PROTIME in the last 168 hours. Cardiac Enzymes: No results for input(s): CKTOTAL, CKMB, CKMBINDEX, TROPONINI in the last 168 hours. BNP (last 3 results) Recent Labs    09/15/20 1132  PROBNP 238.0*   HbA1C: No results for input(s): HGBA1C in the last 72 hours. CBG: Recent Labs  Lab 09/24/20 1141 09/24/20 1625 09/24/20 2330 09/25/20 0733 09/25/20 1117  GLUCAP 96 105* 123* 106* 126*   Lipid Profile: No results for input(s): CHOL, HDL, LDLCALC, TRIG, CHOLHDL, LDLDIRECT in the last 72 hours. Thyroid Function Tests: No results for input(s): TSH, T4TOTAL, FREET4, T3FREE, THYROIDAB in the last 72 hours. Anemia Panel: No results for input(s): VITAMINB12, FOLATE, FERRITIN, TIBC, IRON, RETICCTPCT in the last  72 hours. Sepsis Labs: No results for input(s): PROCALCITON, LATICACIDVEN in the last 168 hours.  Recent Results (from the past 240 hour(s))  Resp Panel by RT-PCR (Flu A&B, Covid) Nasopharyngeal Swab     Status: None   Collection Time: 09/15/20  3:43 PM   Specimen: Nasopharyngeal Swab; Nasopharyngeal(NP) swabs in vial transport medium  Result Value Ref Range Status   SARS Coronavirus 2 by RT PCR NEGATIVE NEGATIVE Final    Comment: (NOTE) SARS-CoV-2 target nucleic acids are NOT DETECTED.  The SARS-CoV-2 RNA is generally detectable in upper respiratory specimens during the acute phase of infection. The lowest concentration of SARS-CoV-2 viral copies this assay can detect is 138 copies/mL. A negative  result does not preclude SARS-Cov-2 infection and should not be used as the sole basis for treatment or other patient management decisions. A negative result may occur with  improper specimen collection/handling, submission of specimen other than nasopharyngeal swab, presence of viral mutation(s) within the areas targeted by this assay, and inadequate number of viral copies(<138 copies/mL). A negative result must be combined with clinical observations, patient history, and epidemiological information. The expected result is Negative.  Fact Sheet for Patients:  EntrepreneurPulse.com.au  Fact Sheet for Healthcare Providers:  IncredibleEmployment.be  This test is no t yet approved or cleared by the [place] FDA and  has been authorized for detection and/or diagnosis of SARS-CoV-2 by FDA under an Emergency Use Authorization (EUA). This EUA will remain  in effect (meaning this test can be used) for the duration of the COVID-19 declaration under Section 564(b)(1) of the Act, 21 U.S.C.section 360bbb-3(b)(1), unless the authorization is terminated  or revoked sooner.       Influenza A by PCR NEGATIVE NEGATIVE Final   Influenza B by PCR NEGATIVE NEGATIVE Final    Comment: (NOTE) The Xpert Xpress SARS-CoV-2/FLU/RSV plus assay is intended as an aid in the diagnosis of influenza from Nasopharyngeal swab specimens and should not be used as a sole basis for treatment. Nasal washings and aspirates are unacceptable for Xpert Xpress SARS-CoV-2/FLU/RSV testing.  Fact Sheet for Patients: EntrepreneurPulse.com.au  Fact Sheet for Healthcare Providers: IncredibleEmployment.be  This test is not yet approved or cleared by the [place] FDA and has been authorized for detection and/or diagnosis of SARS-CoV-2 by FDA under an Emergency Use Authorization (EUA). This EUA will remain in effect (meaning this test can be used)  for the duration of the COVID-19 declaration under Section 564(b)(1) of the Act, 21 U.S.C. section 360bbb-3(b)(1), unless the authorization is terminated or revoked.  Performed at [facility], [place] [address] [place], [place] [address]   Blood culture (routine x 2)     Status: None   Collection Time: 09/15/20  3:43 PM   Specimen: BLOOD  Result Value Ref Range Status   Specimen Description   Final    BLOOD LEFT ANTECUBITAL Performed at [facility] [address] [place], [place] [address]    Special Requests   Final    BOTTLES DRAWN AEROBIC AND ANAEROBIC Blood Culture adequate volume Performed at [facility] [address] [place], [place] [address]    Culture   Final    NO GROWTH 5 DAYS Performed at [facility], [place] [address] [place], [place] [address]    Report Status 09/20/2020 FINAL  Final  Blood culture (routine x 2)     Status: None   Collection Time: 09/15/20  3:48 PM   Specimen: BLOOD  Result Value Ref Range Status   Specimen Description   Final    BLOOD RIGHT ANTECUBITAL Performed at [facility] [address], [place], [place] [address]    Special Requests   Final    BOTTLES DRAWN AEROBIC AND ANAEROBIC Blood Culture adequate volume Performed at [facility] [address], [place], [place] [address]    Culture   Final    NO GROWTH 5 DAYS Performed at [facility], [place] [address] [place], [place] [address]    Report Status 09/20/2020 FINAL  Final  Fungus Culture With Stain     Status: None (Preliminary result)   Collection Time: 09/16/20  9:20 AM   Specimen: Pleural, Right; Pleural Fluid  Result Value Ref Range Status   Fungus Stain Final report  Final    Comment: (NOTE) Performed At: [facility] [address] [place], [place] [id number] Rush Farmer MD [id number]    Fungus (Mycology) Culture PENDING  Incomplete   Fungal  Source PLEURAL  Final    Comment: RIGHT Performed at [facility], [place] [address] [place], [place] [address]   Fungus Culture Result     Status: None   Collection Time: 09/16/20  9:20 AM  Result Value Ref Range Status   Result 1 Comment  Final    Comment: (NOTE) KOH/Calcofluor preparation:  no fungus observed. Performed At: [facility] [place], [place] [id number] Rush Farmer MD [id number]   Body fluid culture w Gram Stain     Status: None   Collection Time: 09/16/20  9:22 AM   Specimen: Pleura  Result Value Ref Range Status   Specimen Description   Final    PLEURAL RIGHT Performed at [facility] [address] [place], [place] [address]    Special Requests   Final    NONE Performed at [facility], [place] [address], [place], [place] [address]    Gram Stain   Final    FEW WBC PRESENT, PREDOMINANTLY MONONUCLEAR NO ORGANISMS SEEN    Culture   Final    NO GROWTH 3 DAYS Performed at [facility] [address], [place], [place] [address]    Report Status 09/20/2020 FINAL  Final  Aerobic/Anaerobic Culture (surgical/deep wound)     Status: None (Preliminary result)   Collection Time: 09/21/20  4:47 PM   Specimen: Pleural Fluid  Result Value Ref Range Status   Specimen Description   Final    PLEURAL DRAIN Performed at [facility] [address] [place], [place] [address]    Special Requests   Final    NONE Performed at [facility], [place] [address], [place], [place] [address]    Gram Stain   Final    RARE WBC PRESENT,BOTH PMN AND MONONUCLEAR NO ORGANISMS SEEN    Culture   Final    NO GROWTH 3 DAYS NO ANAEROBES ISOLATED; CULTURE IN PROGRESS FOR 5 DAYS Performed at [facility], [place] [address], [place], [place] [address]    Report Status PENDING  Incomplete         Radiology Studies: DG Chest Port 1 View  Result Date:  09/25/2020 CLINICAL DATA:  87 year old male with loculated pleural effusion status post chest tube placement. EXAM: PORTABLE CHEST 1 VIEW COMPARISON:  Portable chest 09/22/2020 and earlier. FINDINGS: Portable AP semi upright view at 0436 hours. Stable right pigtail chest tube located laterally.  Patchy hydropneumothorax following some drainage of the loculated pleural collection appears stable since 09/22/2020. Stable volume reduction in the right lung. Stable cardiac size and mediastinal contours. Small left pleural effusion better demonstrated by CT. Otherwise the left lung remains negative. IMPRESSION: 1. Stable right chest tube with residual loculated hydropneumothorax and right lung volume loss. 2. Small left pleural effusion better demonstrated by CT. 3. No new cardiopulmonary abnormality. Electronically Signed   By: Genevie Ann M.D.   On: 09/25/2020 05:00        Scheduled Meds: . apixaban  5 mg Oral BID  . fluticasone  2 spray Each Nare Daily  . furosemide  40 mg Oral Daily  . ipratropium-albuterol  3 mL Nebulization BID  . metoprolol tartrate  25 mg Oral BID  . polyethylene glycol  17 g Oral Daily  . senna-docusate  1 tablet Oral BID  . simvastatin  20 mg Oral q1800  . tamsulosin  0.4 mg Oral Daily   Continuous Infusions:    LOS: 10 days    Time spent: 25 mins.More than 50% of that time was spent in counseling and/or coordination of care.      Shelly Coss, MD [facility] P5/01/2021, 11:48 AM l;l

## 2020-09-25 NOTE — Progress Notes (Signed)
Referring Physician(s): Ramaswamy,M  Supervising Physician: Sandi Mariscal  Patient Status:  [facility] - In-pt  Chief Complaint:  Loculated right pleural effusion  Subjective: Patient states that breathing has improved since chest drain placed; denies fever, chills   Allergies: Lisinopril  Medications: Prior to Admission medications   Medication Sig Start Date End Date Taking? Authorizing Provider  albuterol (VENTOLIN HFA) 108 (90 Base) MCG/ACT inhaler INHALE 1 TO 2 PUFFS BY MOUTH EVERY 6 HOURS AS NEEDED FOR WHEEZING FOR SHORTNESS OF BREATH Patient taking differently: Inhale 2 puffs into the lungs every 6 (six) hours as needed for wheezing or shortness of breath. 09/15/20  Yes [name], MD  amLODipine (NORVASC) 5 MG tablet Take 1 tablet (5 mg total) by mouth daily. 11/26/19  Yes [name], MD  aspirin EC 81 MG tablet Take 81 mg by mouth daily.   Yes [provider]  fluticasone (FLONASE) 50 MCG/ACT nasal spray Place 2 sprays into both nostrils daily. 08/04/18  Yes [name], MD  losartan-hydrochlorothiazide (HYZAAR) 100-25 MG tablet Take 1 tablet by mouth daily. 11/26/19  Yes [name], MD  Multiple Vitamin (MULTIVITAMIN WITH MINERALS) TABS tablet Take 1 tablet by mouth daily.   Yes [provider]  simvastatin (ZOCOR) 20 MG tablet Take 1 tablet (20 mg total) by mouth daily at 6 PM. 11/26/19  Yes [name], MD  tamsulosin (FLOMAX) 0.4 MG CAPS capsule Take 0.4 mg by mouth daily. 07/21/20  Yes [provider]     Vital Signs: BP 104/70 (BP Location: Right Arm)   Pulse 95   Temp 98.5 F (36.9 C) (Oral)   Resp 16   Wt 155 lb 10.3 oz (70.6 kg)   SpO2 97%   BMI 22.33 kg/m   Physical Exam: awake, alert.  Right chest drain intact, to Pleur-evac and wall suction, no obvious air leak, output  340 cc of blood-tinged fluid  Imaging: DG Chest Port 1 View  Result Date: 09/25/2020 CLINICAL DATA:  87 year old  male with loculated pleural effusion status post chest tube placement. EXAM: PORTABLE CHEST 1 VIEW COMPARISON:  Portable chest 09/22/2020 and earlier. FINDINGS: Portable AP semi upright view at 0436 hours. Stable right pigtail chest tube located laterally. Patchy hydropneumothorax following some drainage of the loculated pleural collection appears stable since 09/22/2020. Stable volume reduction in the right lung. Stable cardiac size and mediastinal contours. Small left pleural effusion better demonstrated by CT. Otherwise the left lung remains negative. IMPRESSION: 1. Stable right chest tube with residual loculated hydropneumothorax and right lung volume loss. 2. Small left pleural effusion better demonstrated by CT. 3. No new cardiopulmonary abnormality. Electronically Signed   By: Genevie Ann M.D.   On: 09/25/2020 05:00   DG Chest Port 1 View  Result Date: 09/22/2020 CLINICAL DATA:  Chest tube placement on the right EXAM: PORTABLE CHEST 1 VIEW COMPARISON:  Chest radiograph and chest CT Sep 20, 2020 FINDINGS: Chest tube present on the right peripherally. Significantly less pleural effusion on the right following chest tube placement. There remains loculated effusion on the right with areas of airspace opacity throughout the right lung, most notably in the right lower lung region. No appreciable pneumothorax. There is a small left pleural effusion with left base atelectasis. Heart is upper normal in size with pulmonary vascularity normal. No evident adenopathy. IMPRESSION: Chest tube present on the right with significantly less pleural fluid on the right. Areas of loculated effusion with areas of patchy airspace  opacity and atelectasis noted on the right, primarily in the right lower lung region. Small left pleural effusion with mild left base atelectasis. Stable cardiac silhouette. Electronically Signed   By: Lowella Grip III M.D.   On: 09/22/2020 08:04   CT IMAGE GUIDED FLUID DRAIN BY CATHETER  Result  Date: 09/22/2020 CLINICAL DATA:  Hypoxic respiratory failure, loculated right effusion EXAM: CT GUIDED CHEST DRAIN PLACEMENT ANESTHESIA/SEDATION: Intravenous Fentanyl 46mcg and Versed 1mg  were administered as conscious sedation during continuous monitoring of the patient's level of consciousness and physiological / cardiorespiratory status by the radiology RN, with a total moderate sedation time of 17 minutes. PROCEDURE: The procedure, risks, benefits, and alternatives were explained to the patient. Questions regarding the procedure were encouraged and answered. The patient understands and consents to the procedure. Select axial scans through the thorax were obtained. The loculated right effusion was localized and an appropriate skin entry site was determined and marked. The operative field was prepped with chlorhexidinein a sterile fashion, and a sterile drape was applied covering the operative field. A sterile gown and sterile gloves were used for the procedure. Local anesthesia was provided with 1% Lidocaine. Under CT fluoroscopic guidance, 18 gauge percutaneous entry needle advanced into the collection. Blood-tinged fluid could be aspirated. Amplatz wire advanced easily, position confirmed on CT. Tract dilated to facilitate placement 14 French pigtail drain catheter. Follow-up CT shows good position in the dependent aspect of the loculated right effusion. 20 mL of the blood tinged aspirate were sent for Gram stain and culture. The catheter was secured externally with 0 Prolene suture and StatLock, covered with Vaseline gauze and sterile gauze dressing, and placed to -20 cm H2O suction canister. COMPLICATIONS: None immediate FINDINGS: Loculated right lateral pleural fluid collection was localized. 14 French pigtail drain catheter placed as above. 20 mL of the blood tinged aspirate were sent for Gram stain and culture. IMPRESSION: 1. Technically successful CT-guided right chest drain placement. Electronically  Signed   By: Lucrezia Europe M.D.   On: 09/22/2020 09:53    Labs:  CBC: Recent Labs    09/18/20 1032 09/20/20 1236 09/21/20 0440 09/22/20 0445 09/25/20 0431  WBC 5.0  --  5.1 5.6 5.6  HGB 12.0* 11.6* 11.0* 11.7* 11.6*  HCT 39.2 38.3* 36.0* 38.2* 37.5*  PLT 215  --  188 156 229    COAGS: Recent Labs    09/15/20 2047  INR 1.1    BMP: Recent Labs    09/16/20 0359 09/18/20 1032 09/21/20 0440 09/25/20 0431  NA 138 136 139 137  K 3.8 3.7 3.7 4.0  CL 99 96* 101 100  CO2 32 32 26 30  GLUCOSE 126* 122* 104* 108*  BUN 21 31* 27* 31*  CALCIUM 8.8* 8.8* 8.7* 8.8*  CREATININE 0.90 1.11 0.82 1.15  GFRNONAA >60 >60 >60 >60    LIVER FUNCTION TESTS: Recent Labs    11/26/19 1549 09/15/20 1132 09/15/20 1550 09/15/20 2047 09/16/20 0359  BILITOT 0.5 0.6 0.5  --  0.5  AST 12 11 15   --  16  ALT 9 12 15   --  13  ALKPHOS  --  59 62  --  55  PROT 6.3 6.7 7.1 6.6 6.1*  ALBUMIN  --  3.9 3.9 3.6 3.3*    Assessment and Plan: Patient with history of respiratory failure, bilateral pleural effusions with loculated right pleural effusion, left upper lobe lung mass and ?right lung lesion,? right lobe liver lesion; status post right chest drain placement  on 5/5; afebrile, WBC normal, hemoglobin stable, creatinine normal, pleural fluid cultures negative to date, cytology pending; chest x-ray today with stable right chest tube and residual loculated hydropneumothorax/right lung volume loss, small left effusion; further plans as per CCM/TRH.   Electronically Signed: D. Rowe Robert, PA-C 09/25/2020, 2:11 PM   I spent a total of 15 minutes at the the patient's bedside AND on the patient's hospital floor or unit, greater than 50% of which was counseling/coordinating care for right chest drain    Patient ID: Adam Briggs, male   DOB: 1930/01/11, 87 y.o.   MRN: [id number]

## 2020-09-26 ENCOUNTER — Inpatient Hospital Stay (HOSPITAL_COMMUNITY): Payer: Medicare Other

## 2020-09-26 DIAGNOSIS — J90 Pleural effusion, not elsewhere classified: Secondary | ICD-10-CM | POA: Diagnosis not present

## 2020-09-26 DIAGNOSIS — R918 Other nonspecific abnormal finding of lung field: Secondary | ICD-10-CM | POA: Diagnosis not present

## 2020-09-26 LAB — GLUCOSE, CAPILLARY
Glucose-Capillary: 138 mg/dL — ABNORMAL HIGH (ref 70–99)
Glucose-Capillary: 110 mg/dL — ABNORMAL HIGH (ref 70–99)
Glucose-Capillary: 112 mg/dL — ABNORMAL HIGH (ref 70–99)
Glucose-Capillary: 124 mg/dL — ABNORMAL HIGH (ref 70–99)

## 2020-09-26 LAB — CYTOLOGY - NON PAP

## 2020-09-26 NOTE — Progress Notes (Signed)
PHARMACY NOTE -  eliquis  Pharmacy has been assisting with dosing of Eliquis  for afib. Dosage remains stable at 5mg  bid  and need for further dosage adjustment appears unlikely at present.    Will sign off at this time.  Please reconsult if a change in clinical status warrants re-evaluation of dosage.  Dia Sitter, PharmD, BCPS 09/26/2020 1:04 PM

## 2020-09-26 NOTE — Progress Notes (Signed)
Chaplain engaged in an initial visit with Adam Briggs and his family.  Chaplain was able to utilize [place] family as witnesses for a healthcare POA for another patient.  During visit, chaplain was able to learn about [name]'s healthcare concerns which include finding out what is happening to his body.  They are still in a place of finding answers and finding out about the fluid being drained from his body.  Daughter requested prayer for them.  Chaplain offered prayer and support to them.      09/26/20 1500  Clinical Encounter Type  Visited With Patient and family together  Visit Type Initial;Spiritual support

## 2020-09-26 NOTE — Progress Notes (Signed)
PROGRESS NOTE    Adam Briggs  IRW:[id number] DOB: 04/24/1930 DOA: 09/15/2020 PCP: [name], MD   Chief Complain: Dyspnea  Brief Narrative: Patient is a 87 year old male with a past medical history of hypertension, dyslipidemia, aortic stenosis, who presents to the emergency department on 4/22 with complaints of dyspnea, generalized weakness.  He was seen at his cardiology office where he was found to have pleural effusion, cardiomegaly and he was recommended to go to the emergency department.  On presentation he was tachycardic but saturating fine on room air.  X-ray showed large loculated right pleural effusion.  CT chest confirmed loculated right pleural effusion with irregular pleural thickening suspicious for neoplastic process, moderate left pleural effusion.  PCCM consulted and he underwent thoracentesis.  Pleural cytology was nonconclusive, did not show any malignant cells.Hospital course remarkable for persistent new onset A. fib with RVR, cardiology were consulted.Underwent  right  chest tube placement by IR on 09/21/20.  Waiting for PCCM recommendation about chest tube management/discharge plan.  Assessment & Plan:   Active Problems:   Essential hypertension   Moderate aortic stenosis   SOB (shortness of breath)   Pleural effusion, right   Acute respiratory failure with hypoxia (HCC)   Mass of upper lobe of left lung   Atrial fibrillation, new onset (HCC)   Atrial fibrillation (HCC)   S/P thoracentesis   Acute hypoxic respiratory failure: Secondary to bilateral pleural effusion.  Currently on room air.  Continue supplemental oxygen as needed.  Bilateral pleural effusion: X-ray showed large loculated right pleural effusion.  CT chest confirmed loculated right pleural effusion with irregular pleural thickening suspicious for neoplastic process, moderate left pleural effusion.  PCCM consulted and he underwent thoracentesis.  Pleural fluid cytology report  inconclusive/negative for malignant cells. CT chest done on 09/20/2020 showed stable moderate to large laterally loculated complex pleural effusion demonstrating heterogeneous internal attenuation suggesting proteinaceous or hemorrhagic material andassociated pleural thickening,stable subtotal collapse and consolidation of the right lung with volume loss and mediastinal shift to the right., possible left moderate pleural effusion. S/P right chest tube placement by IR on 09/21/20 Follow-up chest x-ray done on 09/22/20 showed significantly less pleural fluid on the right,areas of loculated effusion with areas of patchy airspace opacity and atelectasis .  On Lasix daily.  Left upper lobe lung mass/right lung lesion:Follow CT has again shown ndeterminate 2.3 cm soft tissue nodule within the left apex suspicious for a primary bronchogenic neoplasm. Plan for PET CT as an outpatient.PCCM following.  He will follow-up with Dr. [name] on 5/16  New onset A. fib: On presentation.  Monitor on telemetry.  Requested  cardiology evaluation for decision on starting anticoagulation on this elderly male with multiple problems. CHA2DS2VASc score of 3.  Started on heparin drip , now changed to Eliquis.  Also started on Lopressor for rate control. TSH normal.  Currently rate is controlled.  Aortic stenosis: Diagnosed with aortic stenosis but has not followed up last 7 years.  Echo showed moderate aortic stenosis  Hypertension: Continue home Norvasc,metoprolol.  Home losartan/hydrochlorothiazide was held.  Currently blood pressure stable.  Hyperlipidemia: Continue statin         DVT prophylaxis:Eliquis Code Status: Full Family Communication: daughter at bedside daily Status is: Inpatient  Remains inpatient appropriate because:Inpatient level of care appropriate due to severity of illness   Dispo: The patient is from: Home              Anticipated d/c is to: Home  Patient currently is not medically  stable to d/c.   Difficult to place patient No Needs PCCM clearance before discharge.    Consultants: PCCM  Procedures:Thoracentesis  Antimicrobials:  Anti-infectives (From admission, onward)   Start     Dose/Rate Route Frequency Ordered Stop   09/21/20 1552  ceFAZolin (ANCEF) 2-4 GM/100ML-% IVPB  Status:  Discontinued       Note to Pharmacy: Lesia Hausen   : cabinet override      09/21/20 1552 09/21/20 1631      Subjective:  Patient seen and examined at bedside this morning.  Hemodynamically stable.  Comfortable like yesterday.  On room air.  Denies any new complaints.  Currently on right-sided chest tube.  Objective: Vitals:   09/26/20 0500 09/26/20 0557 09/26/20 0934 09/26/20 1319  BP:  110/62 116/73 119/71  Pulse:  60 84 60  Resp:  18  18  Temp:  97.7 F (36.5 C)  98.5 F (36.9 C)  TempSrc:  Oral  Oral  SpO2:  96%  96%  Weight: 70.3 kg       Intake/Output Summary (Last 24 hours) at 09/26/2020 1342 Last data filed at 09/26/2020 8250 Gross per 24 hour  Intake 840 ml  Output 1494 ml  Net -654 ml   Filed Weights   09/22/20 0421 09/25/20 0300 09/26/20 0500  Weight: 72.3 kg 70.6 kg 70.3 kg    Examination:  General exam: Overall comfortable, not in distress, pleasant elderly male HEENT: PERRL Respiratory system:  no wheezes or crackles, decreased air entry on the right side Cardiovascular system: Irregularly irregular rhythm Gastrointestinal system: Abdomen is nondistended, soft and nontender. Central nervous system: Alert and oriented Extremities: No edema, no clubbing ,no cyanosis Skin: No rashes, no ulcers,no icterus   Data Reviewed: I have personally reviewed following labs and imaging studies  CBC: Recent Labs  Lab 09/20/20 1236 09/21/20 0440 09/22/20 0445 09/25/20 0431  WBC  --  5.1 5.6 5.6  NEUTROABS  --  3.5  --  4.1  HGB 11.6* 11.0* 11.7* 11.6*  HCT 38.3* 36.0* 38.2* 37.5*  MCV  --  83.3 84.1 82.2  PLT  --  188 156 539   Basic Metabolic  Panel: Recent Labs  Lab 09/21/20 0440 09/25/20 0431  NA 139 137  K 3.7 4.0  CL 101 100  CO2 26 30  GLUCOSE 104* 108*  BUN 27* 31*  CREATININE 0.82 1.15  CALCIUM 8.7* 8.8*   GFR: Estimated Creatinine Clearance: 43.3 mL/min (by C-G formula based on SCr of 1.15 mg/dL). Liver Function Tests: No results for input(s): AST, ALT, ALKPHOS, BILITOT, PROT, ALBUMIN in the last 168 hours. No results for input(s): LIPASE, AMYLASE in the last 168 hours. No results for input(s): AMMONIA in the last 168 hours. Coagulation Profile: No results for input(s): INR, PROTIME in the last 168 hours. Cardiac Enzymes: No results for input(s): CKTOTAL, CKMB, CKMBINDEX, TROPONINI in the last 168 hours. BNP (last 3 results) Recent Labs    09/15/20 1132  PROBNP 238.0*   HbA1C: No results for input(s): HGBA1C in the last 72 hours. CBG: Recent Labs  Lab 09/25/20 1117 09/25/20 1624 09/25/20 2137 09/26/20 0823 09/26/20 1151  GLUCAP 126* 127* 129* 138* 110*   Lipid Profile: No results for input(s): CHOL, HDL, LDLCALC, TRIG, CHOLHDL, LDLDIRECT in the last 72 hours. Thyroid Function Tests: No results for input(s): TSH, T4TOTAL, FREET4, T3FREE, THYROIDAB in the last 72 hours. Anemia Panel: No results for input(s): VITAMINB12, FOLATE, FERRITIN, TIBC, IRON,  RETICCTPCT in the last 72 hours. Sepsis Labs: No results for input(s): PROCALCITON, LATICACIDVEN in the last 168 hours.  Recent Results (from the past 240 hour(s))  Aerobic/Anaerobic Culture (surgical/deep wound)     Status: None (Preliminary result)   Collection Time: 09/21/20  4:47 PM   Specimen: Pleural Fluid  Result Value Ref Range Status   Specimen Description   Final    PLEURAL DRAIN Performed at [facility] [address] [place], [place] [address]    Special Requests   Final    NONE Performed at [facility], [place] [address], [place], [place] [address]    Gram Stain   Final    RARE WBC  PRESENT,BOTH PMN AND MONONUCLEAR NO ORGANISMS SEEN    Culture   Final    NO GROWTH 4 DAYS NO ANAEROBES ISOLATED; CULTURE IN PROGRESS FOR 5 DAYS Performed at [facility], [place] [address], [place], [place] [address]    Report Status PENDING  Incomplete         Radiology Studies: DG Chest 2 View  Result Date: 09/26/2020 CLINICAL DATA:  Pleural effusion EXAM: CHEST - 2 VIEW COMPARISON:  Sep 25, 2020 FINDINGS: Chest tube on the right is again noted with pneumothorax on the right larger than 1 day prior. Loculated pleural effusion in the right base noted. There is atelectatic change in the right lung. Left lung is clear. Heart is upper normal in size. The pulmonary vascularity on the left is normal. Vascularity on the right is somewhat distorted due to pneumothorax and atelectasis. No adenopathy evident. No bone lesions. IMPRESSION: Pneumothorax on the right has increased in size without tension component. Loculated pleural effusion again noted on the right with areas of atelectatic change in the right lung. Left lung is clear. Stable cardiac silhouette. Electronically Signed   By: Lowella Grip III M.D.   On: 09/26/2020 13:33   DG Chest Port 1 View  Result Date: 09/25/2020 CLINICAL DATA:  87 year old male with loculated pleural effusion status post chest tube placement. EXAM: PORTABLE CHEST 1 VIEW COMPARISON:  Portable chest 09/22/2020 and earlier. FINDINGS: Portable AP semi upright view at 0436 hours. Stable right pigtail chest tube located laterally. Patchy hydropneumothorax following some drainage of the loculated pleural collection appears stable since 09/22/2020. Stable volume reduction in the right lung. Stable cardiac size and mediastinal contours. Small left pleural effusion better demonstrated by CT. Otherwise the left lung remains negative. IMPRESSION: 1. Stable right chest tube with residual loculated hydropneumothorax and right lung volume loss. 2. Small left pleural effusion  better demonstrated by CT. 3. No new cardiopulmonary abnormality. Electronically Signed   By: Genevie Ann M.D.   On: 09/25/2020 05:00        Scheduled Meds: . apixaban  5 mg Oral BID  . fluticasone  2 spray Each Nare Daily  . furosemide  40 mg Oral Daily  . metoprolol tartrate  25 mg Oral BID  . polyethylene glycol  17 g Oral Daily  . senna-docusate  1 tablet Oral BID  . simvastatin  20 mg Oral q1800  . tamsulosin  0.4 mg Oral Daily   Continuous Infusions:    LOS: 11 days    Time spent: 25 mins.More than 50% of that time was spent in counseling and/or coordination of care.      Shelly Coss, MD [facility] P5/02/2021, 1:42 PM l;l

## 2020-09-26 NOTE — Progress Notes (Signed)
Referring Physician(s): Ramaswamy,M  Supervising Physician: Jacqulynn Cadet  Patient Status:  [facility] - In-pt  Chief Complaint:  Loculated right pleural effusion  Subjective: Patient currently without new resp complaints,  daughter in room; specifically denies worsening chest pain, dyspnea; constipated  Allergies: Lisinopril  Medications: Prior to Admission medications   Medication Sig Start Date End Date Taking? Authorizing Provider  albuterol (VENTOLIN HFA) 108 (90 Base) MCG/ACT inhaler INHALE 1 TO 2 PUFFS BY MOUTH EVERY 6 HOURS AS NEEDED FOR WHEEZING FOR SHORTNESS OF BREATH Patient taking differently: Inhale 2 puffs into the lungs every 6 (six) hours as needed for wheezing or shortness of breath. 09/15/20  Yes [name], MD  amLODipine (NORVASC) 5 MG tablet Take 1 tablet (5 mg total) by mouth daily. 11/26/19  Yes [name], MD  aspirin EC 81 MG tablet Take 81 mg by mouth daily.   Yes [provider]  fluticasone (FLONASE) 50 MCG/ACT nasal spray Place 2 sprays into both nostrils daily. 08/04/18  Yes [name], MD  losartan-hydrochlorothiazide (HYZAAR) 100-25 MG tablet Take 1 tablet by mouth daily. 11/26/19  Yes [name], MD  Multiple Vitamin (MULTIVITAMIN WITH MINERALS) TABS tablet Take 1 tablet by mouth daily.   Yes [provider]  simvastatin (ZOCOR) 20 MG tablet Take 1 tablet (20 mg total) by mouth daily at 6 PM. 11/26/19  Yes [name], MD  tamsulosin (FLOMAX) 0.4 MG CAPS capsule Take 0.4 mg by mouth daily. 07/21/20  Yes [provider]     Vital Signs: BP 119/71 (BP Location: Left Arm)   Pulse 60   Temp 98.5 F (36.9 C) (Oral)   Resp 18   Wt 154 lb 15.7 oz (70.3 kg)   SpO2 96%   BMI 22.24 kg/m   Physical Exam awake, alert.  Right chest drain intact, to Pleur-evac and wall suction, no obvious air leak, output about 300  cc of blood-tinged fluid; some kinking of tube  noted  Imaging: DG Chest 2 View  Addendum Date: 09/26/2020   ADDENDUM REPORT: 09/26/2020 13:40 ADDENDUM: Critical Value/emergent results were called by telephone at the time of interpretation on 09/26/2020 at 1:40 pm to provider [name] , who verbally acknowledged these results. Electronically Signed   By: Lowella Grip III M.D.   On: 09/26/2020 13:40   Result Date: 09/26/2020 CLINICAL DATA:  Pleural effusion EXAM: CHEST - 2 VIEW COMPARISON:  Sep 25, 2020 FINDINGS: Chest tube on the right is again noted with pneumothorax on the right larger than 1 day prior. Loculated pleural effusion in the right base noted. There is atelectatic change in the right lung. Left lung is clear. Heart is upper normal in size. The pulmonary vascularity on the left is normal. Vascularity on the right is somewhat distorted due to pneumothorax and atelectasis. No adenopathy evident. No bone lesions. IMPRESSION: Pneumothorax on the right has increased in size without tension component. Loculated pleural effusion again noted on the right with areas of atelectatic change in the right lung. Left lung is clear. Stable cardiac silhouette. Electronically Signed: By: Lowella Grip III M.D. On: 09/26/2020 13:33   DG CHEST PORT 1 VIEW  Result Date: 09/26/2020 CLINICAL DATA:  Right-sided pneumothorax with a chest tube in place. EXAM: PORTABLE CHEST 1 VIEW COMPARISON:  Chest radiograph performed the same day. FINDINGS: A moderate right pneumothorax appears slightly decreased from prior exam. A right-sided pleural pigtail catheter is unchanged in position. Loculated pleural fluid on the  right appears unchanged. There is associated atelectasis/airspace disease which is unchanged. The left lung is clear. There is no left pleural effusion or pneumothorax. The cardiac silhouette is obscured and appears unchanged. IMPRESSION: Slightly decreased moderate right pneumothorax. Electronically Signed   By: Zerita Boers M.D.   On: 09/26/2020  16:48   DG Chest Port 1 View  Result Date: 09/25/2020 CLINICAL DATA:  87 year old male with loculated pleural effusion status post chest tube placement. EXAM: PORTABLE CHEST 1 VIEW COMPARISON:  Portable chest 09/22/2020 and earlier. FINDINGS: Portable AP semi upright view at 0436 hours. Stable right pigtail chest tube located laterally. Patchy hydropneumothorax following some drainage of the loculated pleural collection appears stable since 09/22/2020. Stable volume reduction in the right lung. Stable cardiac size and mediastinal contours. Small left pleural effusion better demonstrated by CT. Otherwise the left lung remains negative. IMPRESSION: 1. Stable right chest tube with residual loculated hydropneumothorax and right lung volume loss. 2. Small left pleural effusion better demonstrated by CT. 3. No new cardiopulmonary abnormality. Electronically Signed   By: Genevie Ann M.D.   On: 09/25/2020 05:00    Labs:  CBC: Recent Labs    09/18/20 1032 09/20/20 1236 09/21/20 0440 09/22/20 0445 09/25/20 0431  WBC 5.0  --  5.1 5.6 5.6  HGB 12.0* 11.6* 11.0* 11.7* 11.6*  HCT 39.2 38.3* 36.0* 38.2* 37.5*  PLT 215  --  188 156 229    COAGS: Recent Labs    09/15/20 2047  INR 1.1    BMP: Recent Labs    09/16/20 0359 09/18/20 1032 09/21/20 0440 09/25/20 0431  NA 138 136 139 137  K 3.8 3.7 3.7 4.0  CL 99 96* 101 100  CO2 32 32 26 30  GLUCOSE 126* 122* 104* 108*  BUN 21 31* 27* 31*  CALCIUM 8.8* 8.8* 8.7* 8.8*  CREATININE 0.90 1.11 0.82 1.15  GFRNONAA >60 >60 >60 >60    LIVER FUNCTION TESTS: Recent Labs    11/26/19 1549 09/15/20 1132 09/15/20 1550 09/15/20 2047 09/16/20 0359  BILITOT 0.5 0.6 0.5  --  0.5  AST 12 11 15   --  16  ALT 9 12 15   --  13  ALKPHOS  --  59 62  --  55  PROT 6.3 6.7 7.1 6.6 6.1*  ALBUMIN  --  3.9 3.9 3.6 3.3*    Assessment and Plan: Patient with history of respiratory failure, bilateral pleural effusions with loculated right pleural effusion/hydroptx,  left upper lobe lung mass and ?right lung lesion,? right lobe liver lesion; status post right chest drain placement on 5/5; afebrile, latest pleural fluid cultures negative to date, latest cytology neg; chest x-ray today with slightly decreased moderate right pneumothorax; plans as per CCM   Electronically Signed: D. Rowe Robert, PA-C 09/26/2020, 4:56 PM   I spent a total of 15 minutes at the the patient's bedside AND on the patient's hospital floor or unit, greater than 50% of which was counseling/coordinating care for right chest drain    Patient ID: Adam Briggs, male   DOB: 09-09-1929, 87 y.o.   MRN: [id number]

## 2020-09-26 NOTE — Progress Notes (Signed)
NAME:  Adam Briggs, MRN:  [id number], DOB:  06-Aug-1929, LOS: 63 ADMISSION DATE:  09/15/2020, CONSULTATION DATE:  4/29 REFERRING MD:  Maralyn Sago, CHIEF COMPLAINT:  Pleural effusion and dyspnea   History of Present Illness:  87 year old male, very active, who presented to ED on 4/29 w/ about 1 month h/o progressive SOB & LE edema. Went to PCP who sent to ER for further eval.  In the ER CBC w/out leukocytosis, BNP 238, trop neg, Lactic acid 1.1, CT chest showed large loculated right effusion w/ R lateral pleural thickening, and RLL consolidation, and LUL lung mass w/ mediastinal involvement and small left effusion. There was also small low density foci w/in the right liver. Because of this PCCM was consulted.   Pertinent  Medical History  HTN, Hyperlipidemia, aortic stenosis, BPH, Diabetes, GERD, Prostate cancer s/p seed implant, inguinal hernia   Significant Hospital Events: Including procedures, antibiotic start and stop dates in addition to other pertinent events   . 4/29 Pulm consulted for pleural effusion and dyspnea . 5/04 On RA, pathology returned with no malignant cells; cardiology sign off . 5/05 Rt pig tail catheter placed by IR . 5/09 CT drainage 338ml, chest tube kinked on am exam. New AF. Marland Kitchen 5/10 CT to water seal, 152ml drainage in last 24 hours   Tests:   CT chest 4/29 >> loculated Rt pleural effusion, pleural thickening at Rt lateral lung base, mass like consolidation RLL likely rounded ATX, 2 cm irregular mass LUL, low density foci in Rt liver concerning for metastatic lesions, splenomegaly  Rt pleural fluid 4/30 >> glucose 91, LDH 134, protein < 3, WBC 339 (92% lymphocytes), cytology negative for malignancy  CT chest 5/04 >> moderate to large Rt complex loculated effusion, pleural thickening, LUL mass 2.3 x 1.6 cm  Echo 5/04 >> EF 50 to 55%, mild LVH, mod AS  Interim History / Subjective:  194 ml fluid out of chest tube in last 24 hours  Afebrile  On RA Pt denies  chest pain / SOB New onset AF overnight   Objective   Blood pressure 116/73, pulse 84, temperature 97.7 F (36.5 C), temperature source Oral, resp. rate 18, weight 70.3 kg, SpO2 96 %.        Intake/Output Summary (Last 24 hours) at 09/26/2020 1033 Last data filed at 09/26/2020 8182 Gross per 24 hour  Intake 1200 ml  Output 1494 ml  Net -294 ml   Filed Weights   09/22/20 0421 09/25/20 0300 09/26/20 0500  Weight: 72.3 kg 70.6 kg 70.3 kg    Examination: General: elderly adult male lying in bed in NAD, daughter at bedside HEENT: MM pink/moist, good dentition, mildly HOH, anicteric  Neuro: AAOx4, speech clear, MAE CV: [id number] RRR, holosystolic murmur PULM:  Non-labored on RA, diminished on right, clear on left, left chest tube intact, no leak noted on [place] GI: soft, bsx4 active  Extremities: warm/dry, no edema  Skin: no rashes or lesions   Assessment & Plan:   Large loculated right effusion, Right lateral pleural thickening vs mass. LUL mass, left sided adenopathy, mod sized left effusion w/ associated dyspnea  Hypodense liver lesions Discussion: Exudate by LDH criteria.  Cytology negative x2 on right for malignancy, and culture negative.  Appears to have trapped lung.  No evidence for infection so far.  Plan: - negative second cytology for pleural fluid  - will need outpatient PET CT to assess LUL lesion, liver lesions and pleural thickening  - follow  up arranged with Dr. [name] 5/16 at 3pm  - suspect ex-vacuo pneumothorax on CXR 5/10.  Place patient back to -20 cm suction and follow up CXR  - if no change in CXR post suction, will remove CT in am and patient can likely go home - if resolution of PTX, pt will need to stay on suction and inpatient  New onset atrial fibrillation. Moderate aortic stenosis. - per primary     Daughter updated on plan of care 5/10 at bedside.   Signature:    [name], MSN, APRN, NP-C, AGACNP-BC [facility] 09/26/2020, 10:39 AM   Please see [email or web address] for pager details.   From 7A-7P if no response, please call [phone] After hours, please call ELink [phone]

## 2020-09-27 ENCOUNTER — Inpatient Hospital Stay (HOSPITAL_COMMUNITY): Payer: Medicare Other

## 2020-09-27 LAB — GLUCOSE, CAPILLARY
Glucose-Capillary: 106 mg/dL — ABNORMAL HIGH (ref 70–99)
Glucose-Capillary: 112 mg/dL — ABNORMAL HIGH (ref 70–99)
Glucose-Capillary: 102 mg/dL — ABNORMAL HIGH (ref 70–99)
Glucose-Capillary: 133 mg/dL — ABNORMAL HIGH (ref 70–99)

## 2020-09-27 NOTE — Progress Notes (Signed)
PT Cancellation Note  Patient Details Name: Adam Briggs MRN: [id number] DOB: Feb 27, 1930   Cancelled Treatment:    Reason Eval/Treat Not Completed: Patient not medically ready. Per chart review, pt with chest tub removed today, awaiting chest xray. Will hold PT eval until appropriate.    Talbot Grumbling PT, DPT 09/27/20, 2:05 PM

## 2020-09-27 NOTE — Progress Notes (Signed)
NAME:  Adam Briggs, MRN:  [id number], DOB:  Jan 26, 1930, LOS: 12 ADMISSION DATE:  09/15/2020, CONSULTATION DATE:  4/29 REFERRING MD:  Maralyn Sago, CHIEF COMPLAINT:  Pleural effusion and dyspnea   History of Present Illness:  87 year old male, very active, who presented to ED on 4/29 w/ about 1 month h/o progressive SOB & LE edema. Went to PCP who sent to ER for further eval.  In the ER CBC w/out leukocytosis, BNP 238, trop neg, Lactic acid 1.1, CT chest showed large loculated right effusion w/ R lateral pleural thickening, and RLL consolidation, and LUL lung mass w/ mediastinal involvement and small left effusion. There was also small low density foci w/in the right liver. Because of this PCCM was consulted.   Pertinent  Medical History  HTN, Hyperlipidemia, aortic stenosis, BPH, Diabetes, GERD, Prostate cancer s/p seed implant, inguinal hernia   Significant Hospital Events: Including procedures, antibiotic start and stop dates in addition to other pertinent events   . 4/29 Pulm consulted for pleural effusion and dyspnea . 5/04 On RA, pathology returned with no malignant cells; cardiology sign off . 5/05 Rt pig tail catheter placed by IR . 5/09 CT drainage 338ml, chest tube kinked on am exam. New AF. Marland Kitchen 5/10 CT to water seal, 178ml drainage in last 24 hours  . 5/11 CXR unchanged, chest tube removed  Tests:   CT chest 4/29 >> loculated Rt pleural effusion, pleural thickening at Rt lateral lung base, mass like consolidation RLL likely rounded ATX, 2 cm irregular mass LUL, low density foci in Rt liver concerning for metastatic lesions, splenomegaly  Rt pleural fluid 4/30 >> glucose 91, LDH 134, protein < 3, WBC 339 (92% lymphocytes), cytology negative for malignancy  CT chest 5/04 >> moderate to large Rt complex loculated effusion, pleural thickening, LUL mass 2.3 x 1.6 cm  Echo 5/04 >> EF 50 to 55%, mild LVH, mod AS  Interim History / Subjective:  Afebrile On RA  Daughter at bedside    Objective   Blood pressure 105/62, pulse 69, temperature 97.6 F (36.4 C), temperature source Oral, resp. rate 18, weight 69.4 kg, SpO2 97 %.        Intake/Output Summary (Last 24 hours) at 09/27/2020 1051 Last data filed at 09/27/2020 0900 Gross per 24 hour  Intake 480 ml  Output 840 ml  Net -360 ml   Filed Weights   09/25/20 0300 09/26/20 0500 09/27/20 0500  Weight: 70.6 kg 70.3 kg 69.4 kg    Examination: General: elderly male lying in bed in NAD   HEENT: MM pink/moist, good dentition, anicteric  Neuro: AAOx4, speech clear, MAE  CV: s1s2 irr irr, holosystolic murmur PULM: non-labored on RA, diminished on R.  Chest tube removed during exam.  Occlusive dressing applied to site.   GI: soft, bsx4 active  Extremities: warm/dry, no edema  Skin: no rashes or lesions  Assessment & Plan:   Large loculated right effusion, Right lateral pleural thickening vs mass. LUL mass, left sided adenopathy, mod sized left effusion w/ associated dyspnea  Hypodense liver lesions Discussion: Exudate by LDH criteria.  Cytology negative x2 on right for malignancy, and culture negative.  Appears to have trapped lung with residual hydropneumothorax.  No evidence for infection so far. Repeat pleural cytology negative.  Plan: - discontinue chest tube  - follow up CXR at 3pm  - keep occlusive dressing in place until site healed   - will need outpatient PET CT to assess LUL lesion,  liver lesions and pleural thickening  - follow up with Dr. [name] 5/16 at [facility] onset atrial fibrillation. Moderate aortic stenosis. - per primary     Signature:    [name], MSN, APRN, NP-C, AGACNP-BC [facility] 09/27/2020, 10:51 AM   Please see [email or web address] for pager details.   From 7A-7P if no response, please call [phone] After hours, please call ELink [phone]

## 2020-09-27 NOTE — Progress Notes (Signed)
PROGRESS NOTE    ZOHAIB HEENEY  NLZ:[id number] DOB: Jun 14, 1929 DOA: 09/15/2020 PCP: [name], MD   Brief Narrative:  87 year old male with a past medical history of hypertension, dyslipidemia, aortic stenosis, who presents to the emergency department on 4/22 with complaints of dyspnea, generalized weakness.  He was seen at his cardiology office where he was found to have pleural effusion, cardiomegaly and he was recommended to go to the emergency department.  On presentation he was tachycardic but saturating fine on room air.  X-ray showed large loculated right pleural effusion.  CT chest confirmed loculated right pleural effusion with irregular pleural thickening suspicious for neoplastic process, moderate left pleural effusion.  PCCM consulted and he underwent thoracentesis.  Pleural cytology was nonconclusive, did not show any malignant cells.Hospital course remarkable for persistent new onset A. fib with RVR, cardiology were consulted.Underwent  right  chest tube placement by IR on 09/21/20.    Repeat chest x-ray showed improvement therefore chest tube removed 09/27/2020.   Assessment & Plan:   Active Problems:   Essential hypertension   Moderate aortic stenosis   SOB (shortness of breath)   Pleural effusion, right   Acute respiratory failure with hypoxia (HCC)   Mass of upper lobe of left lung   Atrial fibrillation, new onset (HCC)   Atrial fibrillation (HCC)   S/P thoracentesis      Acute hypoxic respiratory failure:  Improved quite a bit.  Patient currently is on room air.  Bilateral pleural effusion:  X-ray shows loculated effusion which was confirmed on the CT scan as well.  Underwent thoracentesis-pleural cytology was negative for any malignant cells.  Chest tube was placed by IR on 5/5 with some drainage.  Chest x-ray this morning showed improvement and currently is on room air.  Chest tube is removed by pulmonary on 09/27/2020.  Repeat chest x-ray ordered at 3 PM  today.  If remains stable over next 24 hours, patient will be discharged home with outpatient follow-up with pulmonary on 5/16 On Lasix 40 mg p.o. daily  Left upper lobe lung mass/right lung lesion: Outpatient PET scan will be done by pulmonary team.  Has follow-up appointment on 5/16.  New onset A. fib:  Permanent Currently appears to be stable.  TSH is normal Eliquis twice daily.  Rate control strategy for now.  Aortic stenosis Currently patient is chest pain-free.  Diagnosed with aortic stenosis but has not followed up last 7 years.  Echo showed moderate aortic stenosis  Essential hypertension For now continue metoprolol twice daily.  Slowly reintroduce blood pressure medications as tolerated.  Lasix 40 mg p.o. daily  Hyperlipidemia: Continue Zocor   DVT prophylaxis: Eliquis Code Status: Full code Family Communication: Daughter at bedside  Status is: Inpatient  Remains inpatient appropriate because:Inpatient level of care appropriate due to severity of illness   Dispo: The patient is from: Home              Anticipated d/c is to: Home              Patient currently is not medically stable to d/c.  Chest tube has been removed today, repeat chest x-ray planned for later today.  Per pulmonary if patient is stable over next 24 hours he can be discharged tomorrow   Difficult to place patient No     Subjective: Feels better today remains on room air.  Denies any shortness of breath at rest but would like to get up and move around to  see how he feels  Review of Systems Otherwise negative except as per HPI, including: General: Denies fever, chills, night sweats or unintended weight loss. Resp: Denies cough, wheezing, shortness of breath. Cardiac: Denies chest pain, palpitations, orthopnea, paroxysmal nocturnal dyspnea. GI: Denies abdominal pain, nausea, vomiting, diarrhea or constipation GU: Denies dysuria, frequency, hesitancy or incontinence MS: Denies muscle aches,  joint pain or swelling Neuro: Denies headache, neurologic deficits (focal weakness, numbness, tingling), abnormal gait Psych: Denies anxiety, depression, SI/HI/AVH Skin: Denies new rashes or lesions ID: Denies sick contacts, exotic exposures, travel  Examination:  General exam: Appears calm and comfortable  Respiratory system: Clear to auscultation. Respiratory effort normal. Cardiovascular system: S1 & S2 heard, RRR. No JVD, murmurs, rubs, gallops or clicks. No pedal edema. Gastrointestinal system: Abdomen is nondistended, soft and nontender. No organomegaly or masses felt. Normal bowel sounds heard. Central nervous system: Alert and oriented. No focal neurological deficits. Extremities: Symmetric 5 x 5 power. Skin: No rashes, lesions or ulcers Psychiatry: Judgement and insight appear normal. Mood & affect appropriate.  Right-sided chest tube in place   Objective: Vitals:   09/26/20 2120 09/27/20 0500 09/27/20 0607 09/27/20 1158  BP: 116/72  105/62 100/72  Pulse: 79  69 66  Resp: 18  18 19   Temp: 98 F (36.7 C)  97.6 F (36.4 C) 98.1 F (36.7 C)  TempSrc: Oral  Oral Oral  SpO2: 96%  97% 98%  Weight:  69.4 kg      Intake/Output Summary (Last 24 hours) at 09/27/2020 1302 Last data filed at 09/27/2020 0900 Gross per 24 hour  Intake 480 ml  Output 840 ml  Net -360 ml   Filed Weights   09/25/20 0300 09/26/20 0500 09/27/20 0500  Weight: 70.6 kg 70.3 kg 69.4 kg     Data Reviewed:   CBC: Recent Labs  Lab 09/21/20 0440 09/22/20 0445 09/25/20 0431  WBC 5.1 5.6 5.6  NEUTROABS 3.5  --  4.1  HGB 11.0* 11.7* 11.6*  HCT 36.0* 38.2* 37.5*  MCV 83.3 84.1 82.2  PLT 188 156 220   Basic Metabolic Panel: Recent Labs  Lab 09/21/20 0440 09/25/20 0431  NA 139 137  K 3.7 4.0  CL 101 100  CO2 26 30  GLUCOSE 104* 108*  BUN 27* 31*  CREATININE 0.82 1.15  CALCIUM 8.7* 8.8*   GFR: Estimated Creatinine Clearance: 42.7 mL/min (by C-G formula based on SCr of 1.15  mg/dL). Liver Function Tests: No results for input(s): AST, ALT, ALKPHOS, BILITOT, PROT, ALBUMIN in the last 168 hours. No results for input(s): LIPASE, AMYLASE in the last 168 hours. No results for input(s): AMMONIA in the last 168 hours. Coagulation Profile: No results for input(s): INR, PROTIME in the last 168 hours. Cardiac Enzymes: No results for input(s): CKTOTAL, CKMB, CKMBINDEX, TROPONINI in the last 168 hours. BNP (last 3 results) Recent Labs    09/15/20 1132  PROBNP 238.0*   HbA1C: No results for input(s): HGBA1C in the last 72 hours. CBG: Recent Labs  Lab 09/26/20 1151 09/26/20 1639 09/26/20 2047 09/27/20 0750 09/27/20 1155  GLUCAP 110* 112* 124* 106* 112*   Lipid Profile: No results for input(s): CHOL, HDL, LDLCALC, TRIG, CHOLHDL, LDLDIRECT in the last 72 hours. Thyroid Function Tests: No results for input(s): TSH, T4TOTAL, FREET4, T3FREE, THYROIDAB in the last 72 hours. Anemia Panel: No results for input(s): VITAMINB12, FOLATE, FERRITIN, TIBC, IRON, RETICCTPCT in the last 72 hours. Sepsis Labs: No results for input(s): PROCALCITON, LATICACIDVEN in the last 168 hours.  Recent Results (from the past 240 hour(s))  Aerobic/Anaerobic Culture (surgical/deep wound)     Status: None (Preliminary result)   Collection Time: 09/21/20  4:47 PM   Specimen: Pleural Fluid  Result Value Ref Range Status   Specimen Description   Final    PLEURAL DRAIN Performed at [facility] [address] [place], [place] [address]    Special Requests   Final    NONE Performed at [facility], [place] [address], [place], [place] [address]    Gram Stain   Final    RARE WBC PRESENT,BOTH PMN AND MONONUCLEAR NO ORGANISMS SEEN    Culture   Final    CULTURE REINCUBATED FOR BETTER GROWTH Performed at [facility], [place] [address], [place], [place] [address]    Report Status PENDING  Incomplete         Radiology Studies: DG Chest 2  View  Addendum Date: 09/26/2020   ADDENDUM REPORT: 09/26/2020 13:40 ADDENDUM: Critical Value/emergent results were called by telephone at the time of interpretation on 09/26/2020 at 1:40 pm to provider [name] , who verbally acknowledged these results. Electronically Signed   By: Lowella Grip III M.D.   On: 09/26/2020 13:40   Result Date: 09/26/2020 CLINICAL DATA:  Pleural effusion EXAM: CHEST - 2 VIEW COMPARISON:  Sep 25, 2020 FINDINGS: Chest tube on the right is again noted with pneumothorax on the right larger than 1 day prior. Loculated pleural effusion in the right base noted. There is atelectatic change in the right lung. Left lung is clear. Heart is upper normal in size. The pulmonary vascularity on the left is normal. Vascularity on the right is somewhat distorted due to pneumothorax and atelectasis. No adenopathy evident. No bone lesions. IMPRESSION: Pneumothorax on the right has increased in size without tension component. Loculated pleural effusion again noted on the right with areas of atelectatic change in the right lung. Left lung is clear. Stable cardiac silhouette. Electronically Signed: By: Lowella Grip III M.D. On: 09/26/2020 13:33   DG CHEST PORT 1 VIEW  Result Date: 09/27/2020 CLINICAL DATA:  Pneumothorax. EXAM: PORTABLE CHEST 1 VIEW COMPARISON:  Chest x-ray 09/26/2020, CT chest 09/20/2020 FINDINGS: The heart size and mediastinal contours are unchanged Persistent airspace opacity of the right lung. No focal consolidation within the left lung. No pulmonary edema. No pleural effusion. Similar-appearing small to moderate volume right pneumothorax. Right pigtail in similar position overlying the right peripheral pleural space. Fluid also noted within the right pleural space. No definite left pneumothorax. Question trace left pleural effusion. No acute osseous abnormality. IMPRESSION: 1. Similar-appearing small to moderate volume right hydropneumothorax with right chest tube in  similar position. 2. Persistent underlying airspace opacity of the right lung. 3. Question possible trace left pleural effusion. Electronically Signed   By: Iven Finn M.D.   On: 09/27/2020 06:57   DG CHEST PORT 1 VIEW  Result Date: 09/26/2020 CLINICAL DATA:  Right-sided pneumothorax with a chest tube in place. EXAM: PORTABLE CHEST 1 VIEW COMPARISON:  Chest radiograph performed the same day. FINDINGS: A moderate right pneumothorax appears slightly decreased from prior exam. A right-sided pleural pigtail catheter is unchanged in position. Loculated pleural fluid on the right appears unchanged. There is associated atelectasis/airspace disease which is unchanged. The left lung is clear. There is no left pleural effusion or pneumothorax. The cardiac silhouette is obscured and appears unchanged. IMPRESSION: Slightly decreased moderate right pneumothorax. Electronically Signed   By: Zerita Boers  M.D.   On: 09/26/2020 16:48        Scheduled Meds: . apixaban  5 mg Oral BID  . fluticasone  2 spray Each Nare Daily  . furosemide  40 mg Oral Daily  . metoprolol tartrate  25 mg Oral BID  . polyethylene glycol  17 g Oral Daily  . senna-docusate  1 tablet Oral BID  . simvastatin  20 mg Oral q1800  . tamsulosin  0.4 mg Oral Daily   Continuous Infusions:   LOS: 12 days   Time spent= 35 mins    Janin Kozlowski Arsenio Loader, MD [facility]  If 7PM-7AM, please contact night-coverage  09/27/2020, 1:02 PM

## 2020-09-28 ENCOUNTER — Other Ambulatory Visit: Payer: Self-pay

## 2020-09-28 ENCOUNTER — Inpatient Hospital Stay (HOSPITAL_COMMUNITY): Payer: Medicare Other

## 2020-09-28 ENCOUNTER — Encounter (HOSPITAL_COMMUNITY): Payer: Self-pay

## 2020-09-28 ENCOUNTER — Ambulatory Visit (HOSPITAL_COMMUNITY)
Admit: 2020-09-28 | Discharge: 2020-09-28 | Disposition: A | Discharge status: Home / Self Care | Payer: Medicare Other | Attending: Nurse Practitioner | Admitting: Nurse Practitioner

## 2020-09-28 DIAGNOSIS — J9819 Other pulmonary collapse: Secondary | ICD-10-CM

## 2020-09-28 LAB — GLUCOSE, CAPILLARY
Glucose-Capillary: 102 mg/dL — ABNORMAL HIGH (ref 70–99)
Glucose-Capillary: 105 mg/dL — ABNORMAL HIGH (ref 70–99)

## 2020-09-28 LAB — AEROBIC/ANAEROBIC CULTURE W GRAM STAIN (SURGICAL/DEEP WOUND)

## 2020-09-28 MED ORDER — APIXABAN 5 MG PO TABS: 5.0000 mg | ORAL_TABLET | Freq: Two times a day (BID) | ORAL | 0 refills | Status: DC

## 2020-09-28 MED ORDER — FUROSEMIDE 40 MG PO TABS: 40.0000 mg | ORAL_TABLET | Freq: Every day | ORAL | 0 refills | Status: DC

## 2020-09-28 MED ORDER — SENNOSIDES-DOCUSATE SODIUM 8.6-50 MG PO TABS: 1.0000 | ORAL_TABLET | Freq: Every evening | ORAL | 1 refills | Status: DC | PRN

## 2020-09-28 MED ORDER — METOPROLOL TARTRATE 25 MG PO TABS: 25.0000 mg | ORAL_TABLET | Freq: Two times a day (BID) | ORAL | 0 refills | Status: DC

## 2020-09-28 NOTE — Progress Notes (Signed)
Assessment unchanged. Pt and daughter verbalized understanding of dc instructions through teach back. Discharged via wc to front entrance accompanied by daughter and [facility].

## 2020-09-28 NOTE — Plan of Care (Signed)
  Problem: Clinical Measurements: Goal: Respiratory complications will improve Outcome: Progressing   Problem: Activity: Goal: Risk for activity intolerance will decrease Outcome: Progressing   Problem: Nutrition: Goal: Adequate nutrition will be maintained Outcome: Adequate for Discharge   Problem: Pain Managment: Goal: General experience of comfort will improve Outcome: Adequate for Discharge

## 2020-09-28 NOTE — Discharge Summary (Signed)
Physician Discharge Summary  Adam Briggs UXL:[id number] DOB: 20-Nov-1929 DOA: 09/15/2020  PCP: [name], MD  Admit date: 09/15/2020 Discharge date: 09/28/2020  Admitted From: Home Disposition: Home  Recommendations for Outpatient Follow-up:  1. Follow up with PCP in 1-2 weeks 2. Please obtain BMP/CBC in one week your next doctors visit.  3. Eliquis 5 mg twice daily 4. Lasix 40 mg daily, metoprolol 25 mg twice daily 5. Bowel regimen 6. Discontinue Norvasc, losartan/hydrochlorothiazide for now 7. Follow-up outpatient pulmonary on 10/02/2020 8. Outpatient follow-up with A. fib clinic  Discharge Condition: Stable CODE STATUS: Full code Diet recommendation: Heart healthy  Brief/Interim Summary: 87 year old male with a past medical history of hypertension, dyslipidemia, aortic stenosis, who presents to the emergency department on 4/22 with complaints of dyspnea, generalized weakness. He was seen at his cardiology office where he was found to have pleural effusion, cardiomegaly and he was recommended to go to the emergency department. On presentation he was tachycardic but saturating fine on room air. X-ray showed large loculated right pleural effusion. CT chest confirmed loculated right pleural effusion with irregular pleural thickening suspicious for neoplastic process, moderate left pleural effusion. PCCM consulted and he underwent thoracentesis. Pleural cytology was nonconclusive, did not show any malignant cells.Hospital course remarkable for persistent new onset A. fib with RVR, cardiology were consulted.Underwent right chest tube placement by IR on 09/21/20.   Repeat chest x-ray showed improvement therefore chest tube removed 09/27/2020. Follow-up chest x-ray remained stable with persistent right-sided moderate right hydropneumothorax and lung atelectasis.  Seen by pulmonary okay with discharge with outpatient follow-up Family member has been updated regarding his  plan.  Assessment & Plan:   Active Problems:   Essential hypertension   Moderate aortic stenosis   SOB (shortness of breath)   Pleural effusion, right   Acute respiratory failure with hypoxia (HCC)   Mass of upper lobe of left lung   Atrial fibrillation, new onset (HCC)   Atrial fibrillation (HCC)   S/P thoracentesis      Acute hypoxic respiratory failure:Improved significantly after thoracentesis.  Bilateral pleural effusion:  X-ray shows loculated effusion which was confirmed on the CT scan as well.  Underwent thoracentesis-pleural cytology was negative for any malignant cells.  Chest tube was placed by IR on 5/5 with some drainage.  Chest x-ray this morning showed improvement and currently is on room air.  Chest tube is removed by pulmonary on 09/27/2020.  Repeat chest x-ray shows persistent hydropneumothorax but overall appears to be stable.  Patient does not have any shortness of breath and will follow-[facility]. On Lasix 40 mg p.o. daily  Left upper lobe lung mass/right lung lesion: Outpatient PET scan will be done by pulmonary team.  Has follow-up appointment on 5/16.  New onset A. fib: Permanent Currently appears to be stable.  TSH is normal Eliquis twice daily.  Rate control strategy for now.  Aortic stenosis Currently patient is chest pain-free.  Diagnosed with aortic stenosis but has not followed up last 7 years. Echo showed moderate aortic stenosis  Essential hypertension For now continue metoprolol twice daily.  Slowly reintroduce blood pressure medications as tolerated.  Lasix 40 mg p.o. daily.  Norvasc, hydrochlorothiazide/losartan has been discontinued for now.  Can slowly reintroduce as appropriate  Hyperlipidemia: Continue Zocor   Body mass index is 21.95 kg/m.         Discharge Diagnoses:  Active Problems:   Essential hypertension   Moderate aortic stenosis   SOB (shortness of breath)  Pleural effusion, right   Acute  respiratory failure with hypoxia (HCC)   Mass of upper lobe of left lung   Atrial fibrillation, new onset ([place])   Atrial fibrillation (HCC)   S/P thoracentesis      Consultations:  Pulmonary  Subjective: Feels great no complaints.  Discharge Exam: Vitals:   09/27/20 2125 09/28/20 0640  BP: (!) 103/59 121/73  Pulse: 85 62  Resp: 18 18  Temp: 98 F (36.7 C) 97.7 F (36.5 C)  SpO2: 96% 97%   Vitals:   09/27/20 0607 09/27/20 1158 09/27/20 2125 09/28/20 0640  BP: 105/62 100/72 (!) 103/59 121/73  Pulse: 69 66 85 62  Resp: 18 19 18 18   Temp: 97.6 F (36.4 C) 98.1 F (36.7 C) 98 F (36.7 C) 97.7 F (36.5 C)  TempSrc: Oral Oral Oral   SpO2: 97% 98% 96% 97%  Weight:        General: Pt is alert, awake, not in acute distress Cardiovascular: RRR, S1/S2 +, no rubs, no gallops Respiratory: Slight diminished breath sounds at the right lung bases otherwise okay and clear to auscultation Abdominal: Soft, NT, ND, bowel sounds + Extremities: no edema, no cyanosis  Discharge Instructions   Allergies as of 09/28/2020      Reactions   Lisinopril    REACTION: Rash      Medication List    STOP taking these medications   amLODipine 5 MG tablet Commonly known as: NORVASC   losartan-hydrochlorothiazide 100-25 MG tablet Commonly known as: HYZAAR     TAKE these medications   albuterol 108 (90 Base) MCG/ACT inhaler Commonly known as: VENTOLIN HFA INHALE 1 TO 2 PUFFS BY MOUTH EVERY 6 HOURS AS NEEDED FOR WHEEZING FOR SHORTNESS OF BREATH What changed:   how much to take  how to take this  when to take this  reasons to take this  additional instructions   apixaban 5 MG Tabs tablet Commonly known as: ELIQUIS Take 1 tablet (5 mg total) by mouth 2 (two) times daily.   aspirin EC 81 MG tablet Take 81 mg by mouth daily.   fluticasone 50 MCG/ACT nasal spray Commonly known as: FLONASE Place 2 sprays into both nostrils daily.   furosemide 40 MG tablet Commonly  known as: LASIX Take 1 tablet (40 mg total) by mouth daily.   metoprolol tartrate 25 MG tablet Commonly known as: LOPRESSOR Take 1 tablet (25 mg total) by mouth 2 (two) times daily.   multivitamin with minerals Tabs tablet Take 1 tablet by mouth daily.   senna-docusate 8.6-50 MG tablet Commonly known as: Senokot-S Take 1 tablet by mouth at bedtime as needed for mild constipation or moderate constipation.   simvastatin 20 MG tablet Commonly known as: ZOCOR Take 1 tablet (20 mg total) by mouth daily at 6 PM.   tamsulosin 0.4 MG Caps capsule Commonly known as: FLOMAX Take 0.4 mg by mouth daily.       Follow-up Information    [facility]. Go on 09/28/2020.   Specialty: Cardiology Why: @1 :30pm   Please call for direction and parking code  Contact information: [address] [id number] [place] [address] [phone]       June Leap L, DO Follow up on 10/02/2020.   Specialty: Pulmonary Disease Why: Appt at 3PM.  Please arrive at 2:45 for check in.   Contact information: [address] [place] [address] [phone]        Pricilla Holm  A, MD. Schedule an appointment as soon as possible for a visit in 1 week(s).   Specialty: Internal Medicine Contact information: [place] [address] [phone]              Allergies  Allergen Reactions  . Lisinopril     REACTION: Rash    You were cared for by a hospitalist during your hospital stay. If you have any questions about your discharge medications or the care you received while you were in the hospital after you are discharged, you can call the unit and asked to speak with the hospitalist on call if the hospitalist that took care of you is not available. Once you are discharged, your primary care physician will handle any further medical issues. Please note that no refills for any discharge medications will be authorized  once you are discharged, as it is imperative that you return to your primary care physician (or establish a relationship with a primary care physician if you do not have one) for your aftercare needs so that they can reassess your need for medications and monitor your lab values.   Procedures/Studies: DG Chest 2 View  Result Date: 09/28/2020 CLINICAL DATA:  Prostate cancer, shortness of breath EXAM: CHEST - 2 VIEW COMPARISON:  Portable exam 1000 hours compared to 09/27/2020 FINDINGS: Persistent moderate-sized RIGHT hydropneumothorax and RIGHT lung atelectasis. Normal heart size, mediastinal contours, and pulmonary vascularity. LEFT lung clear with minimal effusion blunting the posterior costophrenic angle. No acute osseous findings. IMPRESSION: Persistent moderate RIGHT hydropneumothorax and RIGHT lung atelectasis. Minimal LEFT pleural effusion. Electronically Signed   By: Lavonia Dana M.D.   On: 09/28/2020 10:33   DG Chest 2 View  Addendum Date: 09/26/2020   ADDENDUM REPORT: 09/26/2020 13:40 ADDENDUM: Critical Value/emergent results were called by telephone at the time of interpretation on 09/26/2020 at 1:40 pm to provider [name] , who verbally acknowledged these results. Electronically Signed   By: Lowella Grip III M.D.   On: 09/26/2020 13:40   Result Date: 09/26/2020 CLINICAL DATA:  Pleural effusion EXAM: CHEST - 2 VIEW COMPARISON:  Sep 25, 2020 FINDINGS: Chest tube on the right is again noted with pneumothorax on the right larger than 1 day prior. Loculated pleural effusion in the right base noted. There is atelectatic change in the right lung. Left lung is clear. Heart is upper normal in size. The pulmonary vascularity on the left is normal. Vascularity on the right is somewhat distorted due to pneumothorax and atelectasis. No adenopathy evident. No bone lesions. IMPRESSION: Pneumothorax on the right has increased in size without tension component. Loculated pleural effusion again noted on  the right with areas of atelectatic change in the right lung. Left lung is clear. Stable cardiac silhouette. Electronically Signed: By: Lowella Grip III M.D. On: 09/26/2020 13:33   DG Chest 2 View  Result Date: 09/15/2020 CLINICAL DATA:  Shortness of breath. EXAM: CHEST - 2 VIEW COMPARISON:  January 16, 2012. FINDINGS: The heart size and mediastinal contours are within normal limits. No pneumothorax is noted. Large loculated right pleural effusion is noted with associated right basilar atelectasis or infiltrate. Minimal left basilar subsegmental atelectasis is noted with probable small left pleural effusion. The visualized skeletal structures are unremarkable. IMPRESSION: Large loculated right pleural effusion is noted with associated right basilar atelectasis or infiltrate. Electronically Signed   By: Marijo Conception M.D.   On: 09/15/2020 15:52   CT CHEST WO CONTRAST  Result Date: 09/20/2020 CLINICAL  DATA:  Pleural effusion, dyspnea EXAM: CT CHEST WITHOUT CONTRAST TECHNIQUE: Multidetector CT imaging of the chest was performed following the standard protocol without IV contrast. COMPARISON:  01/15/2021 FINDINGS: Cardiovascular: Extensive multi-vessel coronary artery calcification is again noted. There is extensive calcification of the aortic valve leaflets. Global cardiac size is within normal limits. No pericardial effusion. The central pulmonary arteries are enlarged in keeping with changes of pulmonary arterial hypertension. There is moderate atherosclerotic calcification within the thoracic aorta. No aortic aneurysm. Mediastinum/Nodes: There is mild mediastinal shift to the right related to right-sided volume loss again noted. Shotty mediastinal adenopathy is stable, possibly reactive in nature. No frankly pathologic thoracic adenopathy identified. The esophagus is unremarkable. The visualized thyroid is unremarkable. Lungs/Pleura: Moderate to large right complex laterally loculated pleural effusion  is unchanged in size and position extending from the apex to the right lung base and circumferentially constricting of the right lung. There is pleural thickening identified as well as heterogeneity of the a contents of the pleural effusion suggesting proteinaceous or hemorrhagic material. There is stable subtotal collapse and consolidation of the right lung with a pleural rind again identified. Moderate dependently layering left pleural effusion with compressive atelectasis of the left lower lobe is unchanged. Left apical subpleural paramediastinal nodule is unchanged measuring 2.3 x 1.6 cm at axial image # 34/5. No pneumothorax.  No central obstructing lesion. Upper Abdomen: No acute abnormality. Previously noted subtle hepatic abnormalities are not as well appreciated on this examination in absence of contrast administration. Musculoskeletal: No lytic or blastic bone lesion identified. IMPRESSION: Stable examination with moderate to large laterally loculated complex pleural effusion demonstrating heterogeneous internal attenuation suggesting proteinaceous or hemorrhagic material and associated pleural thickening. Stable subtotal collapse and consolidation of the right lung with volume loss and mediastinal shift to the right. Moderate left dependently layering pleural effusion. Indeterminate 2.3 cm soft tissue nodule within the left apex suspicious for a primary bronchogenic neoplasm. PET CT examination may be helpful for further evaluation. Extensive multi-vessel coronary artery calcification. Extensive calcification of the aortic valve leaflets. Morphologic changes in keeping with pulmonary arterial hypertension. Aortic Atherosclerosis (ICD10-I70.0). Electronically Signed   By: Fidela Salisbury MD   On: 09/20/2020 20:30   CT Chest W Contrast  Result Date: 09/15/2020 CLINICAL DATA:  Chest x-ray showing pleural effusion. EXAM: CT CHEST WITH CONTRAST TECHNIQUE: Multidetector CT imaging of the chest was performed  during intravenous contrast administration. CONTRAST:  42mL OMNIPAQUE IOHEXOL 300 MG/ML  SOLN COMPARISON:  Chest x-ray from earlier same day. FINDINGS: Cardiovascular: Aortic atherosclerosis. No thoracic aortic aneurysm. No pericardial effusion. Scattered coronary artery calcifications. Mediastinum/Nodes: Numerous small lymph nodes within the mediastinum, majority with short axis measurements of less than 1 cm. Largest lymph node is seen within the upper LEFT paratracheal region with a short axis measurement of 1.3 cm (series 2, image 32). Esophagus is unremarkable.  Trachea is unremarkable. Lungs/Pleura: Loculated RIGHT pleural effusion, moderate to large in size. Eccentric irregular pleural thickening at the RIGHT lateral lung base, at the periphery of the loculated pleural effusion (series 2, image 106), suspicious for neoplastic process. Additional masslike consolidations within the RIGHT lower lung, at the medial aspects of the loculated pleural effusion, most likely rib associated rounded atelectasis but some component may represent neoplastic mass. Irregular mass within the LEFT upper lobe, medial aspect, measuring 2 cm greatest dimension, almost certainly neoplastic. This abuts the LEFT mediastinal margin and there may be slight extension of this mass into the mediastinum. Additional layering LEFT pleural  effusion, moderate in size. Upper Abdomen: Small low-density foci within the RIGHT liver lobe (series 2, images 150 and 151) w, suspicious for metastases. Splenomegaly. Musculoskeletal: No acute or suspicious osseous finding. IMPRESSION: 1. Loculated RIGHT pleural effusion, moderate to large in size. Eccentric irregular pleural thickening at the RIGHT lateral lung base, located at the periphery of this loculated pleural effusion, highly suspicious for neoplastic process at the pleural surface (primary or metastatic) with associated malignant effusion. 2. Additional larger masslike consolidations within the  RIGHT lower lung, at the medial aspects of the loculated pleural effusion, most likely rounded atelectasis but some component may represent neoplastic mass. 3. Highly suspicious irregular mass within the LEFT upper lobe, medial aspect, measuring 2 cm greatest dimension, almost certainly neoplastic. This abuts the LEFT mediastinal margin and may extend into the mediastinum 4. Additional layering LEFT pleural effusion, moderate in size, also suspicious for malignant effusion. 5. Small low-density foci within the RIGHT liver lobe, suspicious for hepatic metastases. 6. Splenomegaly. Aortic Atherosclerosis (ICD10-I70.0). Electronically Signed   By: Franki Cabot M.D.   On: 09/15/2020 18:32   DG CHEST PORT 1 VIEW  Result Date: 09/27/2020 CLINICAL DATA:  Chest tube removal EXAM: PORTABLE CHEST 1 VIEW COMPARISON:  09/27/2020, 09/26/2020 CT 09/20/2020 FINDINGS: Removal of right-sided chest tube. Grossly stable moderate size right hydropneumothorax. Airspace disease within the right lung parenchyma. Small left-sided effusion with improved aeration left base. Stable cardiomediastinal silhouette. IMPRESSION: 1. Removal of right chest tube with grossly similar size of moderate right hydropneumothorax. Atelectasis and airspace disease of the right lung. 2. Small left effusion with slightly improved aeration at left lung base. Electronically Signed   By: Donavan Foil M.D.   On: 09/27/2020 17:14   DG CHEST PORT 1 VIEW  Result Date: 09/27/2020 CLINICAL DATA:  Pneumothorax. EXAM: PORTABLE CHEST 1 VIEW COMPARISON:  Chest x-ray 09/26/2020, CT chest 09/20/2020 FINDINGS: The heart size and mediastinal contours are unchanged Persistent airspace opacity of the right lung. No focal consolidation within the left lung. No pulmonary edema. No pleural effusion. Similar-appearing small to moderate volume right pneumothorax. Right pigtail in similar position overlying the right peripheral pleural space. Fluid also noted within the right  pleural space. No definite left pneumothorax. Question trace left pleural effusion. No acute osseous abnormality. IMPRESSION: 1. Similar-appearing small to moderate volume right hydropneumothorax with right chest tube in similar position. 2. Persistent underlying airspace opacity of the right lung. 3. Question possible trace left pleural effusion. Electronically Signed   By: Iven Finn M.D.   On: 09/27/2020 06:57   DG CHEST PORT 1 VIEW  Result Date: 09/26/2020 CLINICAL DATA:  Right-sided pneumothorax with a chest tube in place. EXAM: PORTABLE CHEST 1 VIEW COMPARISON:  Chest radiograph performed the same day. FINDINGS: A moderate right pneumothorax appears slightly decreased from prior exam. A right-sided pleural pigtail catheter is unchanged in position. Loculated pleural fluid on the right appears unchanged. There is associated atelectasis/airspace disease which is unchanged. The left lung is clear. There is no left pleural effusion or pneumothorax. The cardiac silhouette is obscured and appears unchanged. IMPRESSION: Slightly decreased moderate right pneumothorax. Electronically Signed   By: Zerita Boers M.D.   On: 09/26/2020 16:48   DG Chest Port 1 View  Result Date: 09/25/2020 CLINICAL DATA:  87 year old male with loculated pleural effusion status post chest tube placement. EXAM: PORTABLE CHEST 1 VIEW COMPARISON:  Portable chest 09/22/2020 and earlier. FINDINGS: Portable AP semi upright view at 0436 hours. Stable right pigtail chest tube  located laterally. Patchy hydropneumothorax following some drainage of the loculated pleural collection appears stable since 09/22/2020. Stable volume reduction in the right lung. Stable cardiac size and mediastinal contours. Small left pleural effusion better demonstrated by CT. Otherwise the left lung remains negative. IMPRESSION: 1. Stable right chest tube with residual loculated hydropneumothorax and right lung volume loss. 2. Small left pleural effusion better  demonstrated by CT. 3. No new cardiopulmonary abnormality. Electronically Signed   By: Genevie Ann M.D.   On: 09/25/2020 05:00   DG Chest Port 1 View  Result Date: 09/22/2020 CLINICAL DATA:  Chest tube placement on the right EXAM: PORTABLE CHEST 1 VIEW COMPARISON:  Chest radiograph and chest CT Sep 20, 2020 FINDINGS: Chest tube present on the right peripherally. Significantly less pleural effusion on the right following chest tube placement. There remains loculated effusion on the right with areas of airspace opacity throughout the right lung, most notably in the right lower lung region. No appreciable pneumothorax. There is a small left pleural effusion with left base atelectasis. Heart is upper normal in size with pulmonary vascularity normal. No evident adenopathy. IMPRESSION: Chest tube present on the right with significantly less pleural fluid on the right. Areas of loculated effusion with areas of patchy airspace opacity and atelectasis noted on the right, primarily in the right lower lung region. Small left pleural effusion with mild left base atelectasis. Stable cardiac silhouette. Electronically Signed   By: Lowella Grip III M.D.   On: 09/22/2020 08:04   DG CHEST PORT 1 VIEW  Result Date: 09/20/2020 CLINICAL DATA:  Pleural effusion EXAM: PORTABLE CHEST 1 VIEW COMPARISON:  09/16/2020, CT chest 09/15/2020 FINDINGS: Moderate loculated right pleural effusion and pleural disease without great change. Possible tiny loculated pneumothorax at the right apex also unchanged. Left upper lung lung mass not well demonstrated on radiography. Stable cardiomediastinal silhouette. Small left effusion IMPRESSION: 1. Grossly stable moderate to large loculated right pleural effusion and pleural disease with possible trace loculated pneumothorax at the right apex 2. Similar airspace disease at the right lung base. Probable small left effusion Electronically Signed   By: Donavan Foil M.D.   On: 09/20/2020 18:51   DG  Chest Port 1 View  Result Date: 09/16/2020 CLINICAL DATA:  Status post RIGHT thoracentesis for loculated RIGHT pleural effusion. EXAM: PORTABLE CHEST 1 VIEW COMPARISON:  09/15/2020 chest radiograph, CT and prior studies FINDINGS: Loculated RIGHT pleural effusion has decreased in size. A tiny amount of air within the loculated SUPERIOR RIGHT pleural space is noted. Nodularity of the LOWER RIGHT lung/pleura again noted. RIGHT hemithorax volume loss is again noted as well as LEFT pleural effusion. IMPRESSION: Decreased loculated RIGHT pleural effusion with tiny amount of air within the loculated SUPERIOR pleural space. No other significant changes. Electronically Signed   By: Margarette Canada M.D.   On: 09/16/2020 10:00   ECHOCARDIOGRAM COMPLETE  Result Date: 09/20/2020    ECHOCARDIOGRAM REPORT   Patient Name:   Adam Briggs Date of Exam: 09/20/2020 Medical Rec #:  [id number]     Height:       70.0 in Accession #:    [id number]    Weight:       157.2 lb Date of Birth:  [birth year]     BSA:          1.884 m Patient Age:    28 years      BP:           102/67 mmHg Patient Gender: M  HR:           80 bpm. Exam Location:  Inpatient Procedure: 2D Echo Indications:    Atrial Fibrillation  History:        Patient has prior history of Echocardiogram examinations, most                 recent 09/16/2020. Signs/Symptoms:Murmur; Risk                 Factors:Hypertension and Diabetes.  Sonographer:    Mikki Santee RDCS (AE) Referring Phys: [id number] Darreld Mclean  Sonographer Comments: Technically difficult study due to poor echo windows, suboptimal apical window and suboptimal subcostal window. IMPRESSIONS  1. As with prior echo 09/16/20 poor quality images LV has abnormal septal motion unable to assess RWMA;s consider TEE if clinically indicated to also evaluate severity of AS. Left ventricular ejection fraction, by estimation, is ? 50-55%%. The left ventricle has mildly decreased function. Left ventricular  endocardial border not optimally defined to evaluate regional wall motion. There is mild left ventricular hypertrophy. Left ventricular diastolic parameters are indeterminate.  2. Right ventricular systolic function was not well visualized. The right ventricular size is not well visualized.  3. The mitral valve was not well visualized. Trivial mitral valve regurgitation.  4. Poorly visualized some calcium seen mean gradient 19 peak 37 mmHg likely moderate AS. Consider TEE if clinically indicated . The aortic valve was not well visualized. Aortic valve regurgitation is not visualized. Moderate aortic valve stenosis.  5. Pulmonic valve regurgitation poorly seen. FINDINGS  Left Ventricle: As with prior echo 09/16/20 poor quality images LV has abnormal septal motion unable to assess RWMA;s consider TEE if clinically indicated to also evaluate severity of AS. Left ventricular ejection fraction, by estimation, is ? 50-55%%. The left ventricle has mildly decreased function. Left ventricular endocardial border not optimally defined to evaluate regional wall motion. The left ventricular internal cavity size was normal in size. There is mild left ventricular hypertrophy. Left ventricular diastolic parameters are indeterminate. Right Ventricle: The right ventricular size is not well visualized. Right vetricular wall thickness was not well visualized. Right ventricular systolic function was not well visualized. Left Atrium: Left atrial size was normal in size. Right Atrium: Right atrial size was not well visualized. Pericardium: There is no evidence of pericardial effusion. Mitral Valve: The mitral valve was not well visualized. Trivial mitral valve regurgitation. Tricuspid Valve: The tricuspid valve is normal in structure. Tricuspid valve regurgitation is mild. Aortic Valve: Poorly visualized some calcium seen mean gradient 19 peak 37 mmHg likely moderate AS. Consider TEE if clinically indicated. The aortic valve was not well  visualized. Aortic valve regurgitation is not visualized. Moderate aortic stenosis is present. Aortic valve mean gradient measures 19.0 mmHg. Aortic valve peak gradient measures 35.6 mmHg. Aortic valve area, by VTI measures 0.73 cm. Pulmonic Valve: The pulmonic valve was not well visualized. Pulmonic valve regurgitation poorly seen. Aorta: The aortic root was not well visualized. IAS/Shunts: The interatrial septum was not well visualized.  LEFT VENTRICLE PLAX 2D LVIDd:         4.17 cm  Diastology LVIDs:         2.59 cm  LV e' medial:    9.36 cm/s LV PW:         1.23 cm  LV E/e' medial:  1.0 LV IVS:        0.91 cm  LV e' lateral:   9.68 cm/s LVOT diam:     2.20 cm  LV E/e' lateral: 1.0 LV SV:         43 LV SV Index:   23 LVOT Area:     3.80 cm  LEFT ATRIUM           Index       RIGHT ATRIUM           Index LA diam:      3.10 cm 1.65 cm/m  RA Area:     20.90 cm LA Vol (A2C): 69.7 ml 36.99 ml/m RA Volume:   61.60 ml  32.69 ml/m  AORTIC VALVE AV Area (Vmax):    0.71 cm AV Area (Vmean):   0.73 cm AV Area (VTI):     0.73 cm AV Vmax:           298.50 cm/s AV Vmean:          199.500 cm/s AV VTI:            0.588 m AV Peak Grad:      35.6 mmHg AV Mean Grad:      19.0 mmHg LVOT Vmax:         55.40 cm/s LVOT Vmean:        38.500 cm/s LVOT VTI:          0.113 m LVOT/AV VTI ratio: 0.19  AORTA Ao Root diam: 2.80 cm MV E velocity: 9.36 cm/s                           SHUNTS                           Systemic VTI:  0.11 m                           Systemic Diam: 2.20 cm Jenkins Rouge MD Electronically signed by Jenkins Rouge MD Signature Date/Time: 09/20/2020/12:05:21 PM    Final    ECHOCARDIOGRAM COMPLETE  Result Date: 09/16/2020    ECHOCARDIOGRAM REPORT   Patient Name:   Adam Briggs Date of Exam: 09/16/2020 Medical Rec #:  [id number]     Height:       70.0 in Accession #:    [id number]    Weight:       170.0 lb Date of Birth:  [birth year]     BSA:          1.948 m Patient Age:    85 years      BP:           122/82 mmHg  Patient Gender: M             HR:           102 bpm. Exam Location:  Inpatient Procedure: 2D Echo, Cardiac Doppler and Color Doppler Indications:    I35.0 Nonrheumatic aortic (valve) stenosis  History:        Patient has prior history of Echocardiogram examinations, most                 recent 11/05/2012. Signs/Symptoms:Murmur; Risk                 Factors:Hypertension, Diabetes, Dyslipidemia and GERD. Cancer.  Sonographer:    Jonelle Sidle Dance Referring Phys: [id number] [place]  Sonographer Comments: Technically difficult study due to poor echo windows, no parasternal window, no subcostal window, suboptimal apical window and no apical window. IMPRESSIONS  1. LV not visualized.  2. RV not visualized.  3. Moderate pleural effusion in the right lateral region.  4. The aortic valve was not well visualized. Moderate aortic valve stenosis. Aortic valve mean gradient measures 26.0 mmHg. Aortic valve Vmax measures 3.47 m/s. FINDINGS  Left Ventricle: LV not visualized. Right Ventricle: RV not visualized. Aortic Valve: The aortic valve was not well visualized. Moderate aortic stenosis is present. Aortic valve mean gradient measures 26.0 mmHg. Aortic valve peak gradient measures 48.2 mmHg. Additional Comments: There is a moderate pleural effusion in the right lateral region.  AORTIC VALVE AV Vmax:      347.00 cm/s AV Vmean:     238.000 cm/s AV VTI:       0.603 m AV Peak Grad: 48.2 mmHg AV Mean Grad: 26.0 mmHg Candee Furbish MD Electronically signed by Candee Furbish MD Signature Date/Time: 09/16/2020/2:29:47 PM    Final    CT IMAGE GUIDED FLUID DRAIN BY CATHETER  Result Date: 09/22/2020 CLINICAL DATA:  Hypoxic respiratory failure, loculated right effusion EXAM: CT GUIDED CHEST DRAIN PLACEMENT ANESTHESIA/SEDATION: Intravenous Fentanyl 39mcg and Versed 1mg  were administered as conscious sedation during continuous monitoring of the patient's level of consciousness and physiological / cardiorespiratory status by the radiology RN,  with a total moderate sedation time of 17 minutes. PROCEDURE: The procedure, risks, benefits, and alternatives were explained to the patient. Questions regarding the procedure were encouraged and answered. The patient understands and consents to the procedure. Select axial scans through the thorax were obtained. The loculated right effusion was localized and an appropriate skin entry site was determined and marked. The operative field was prepped with chlorhexidinein a sterile fashion, and a sterile drape was applied covering the operative field. A sterile gown and sterile gloves were used for the procedure. Local anesthesia was provided with 1% Lidocaine. Under CT fluoroscopic guidance, 18 gauge percutaneous entry needle advanced into the collection. Blood-tinged fluid could be aspirated. Amplatz wire advanced easily, position confirmed on CT. Tract dilated to facilitate placement 14 French pigtail drain catheter. Follow-up CT shows good position in the dependent aspect of the loculated right effusion. 20 mL of the blood tinged aspirate were sent for Gram stain and culture. The catheter was secured externally with 0 Prolene suture and StatLock, covered with Vaseline gauze and sterile gauze dressing, and placed to -20 cm H2O suction canister. COMPLICATIONS: None immediate FINDINGS: Loculated right lateral pleural fluid collection was localized. 14 French pigtail drain catheter placed as above. 20 mL of the blood tinged aspirate were sent for Gram stain and culture. IMPRESSION: 1. Technically successful CT-guided right chest drain placement. Electronically Signed   By: Lucrezia Europe M.D.   On: 09/22/2020 09:53      The results of significant diagnostics from this hospitalization (including imaging, microbiology, ancillary and laboratory) are listed below for reference.     Microbiology: Recent Results (from the past 240 hour(s))  Aerobic/Anaerobic Culture (surgical/deep wound)     Status: None   Collection  Time: 09/21/20  4:47 PM   Specimen: Pleural Fluid  Result Value Ref Range Status   Specimen Description   Final    PLEURAL DRAIN Performed at [facility] [address], [place], [place] [address]    Special Requests   Final    NONE Performed at [facility], [place] [address], [place], [place] [address]    Gram Stain   Final    RARE WBC PRESENT,BOTH PMN AND MONONUCLEAR NO ORGANISMS SEEN  Culture   Final    RARE PROPIONIBACTERIUM ACNES Standardized susceptibility testing for this organism is not available. Performed at [facility], [place] [address], [place], [place] [address]    Report Status 09/28/2020 FINAL  Final     Labs: BNP (last 3 results) Recent Labs    11/26/19 1549 09/15/20 1550  BNP 126* 347.4*   Basic Metabolic Panel: Recent Labs  Lab 09/25/20 0431  NA 137  K 4.0  CL 100  CO2 30  GLUCOSE 108*  BUN 31*  CREATININE 1.15  CALCIUM 8.8*   Liver Function Tests: No results for input(s): AST, ALT, ALKPHOS, BILITOT, PROT, ALBUMIN in the last 168 hours. No results for input(s): LIPASE, AMYLASE in the last 168 hours. No results for input(s): AMMONIA in the last 168 hours. CBC: Recent Labs  Lab 09/22/20 0445 09/25/20 0431  WBC 5.6 5.6  NEUTROABS  --  4.1  HGB 11.7* 11.6*  HCT 38.2* 37.5*  MCV 84.1 82.2  PLT 156 229   Cardiac Enzymes: No results for input(s): CKTOTAL, CKMB, CKMBINDEX, TROPONINI in the last 168 hours. BNP: Invalid input(s): POCBNP CBG: Recent Labs  Lab 09/27/20 1155 09/27/20 1658 09/27/20 2124 09/28/20 0732 09/28/20 1142  GLUCAP 112* 102* 133* 102* 105*   D-Dimer No results for input(s): DDIMER in the last 72 hours. Hgb A1c No results for input(s): HGBA1C in the last 72 hours. Lipid Profile No results for input(s): CHOL, HDL, LDLCALC, TRIG, CHOLHDL, LDLDIRECT in the last 72 hours. Thyroid function studies No results for input(s): TSH, T4TOTAL, T3FREE, THYROIDAB in the last 72  hours.  Invalid input(s): FREET3 Anemia work up No results for input(s): VITAMINB12, FOLATE, FERRITIN, TIBC, IRON, RETICCTPCT in the last 72 hours. Urinalysis    Component Value Date/Time   COLORURINE YELLOW 01/16/2012 1008   APPEARANCEUR CLEAR 01/16/2012 1008   LABSPEC 1.013 01/16/2012 1008   PHURINE 7.0 01/16/2012 1008   GLUCOSEU NEGATIVE 01/16/2012 1008   GLUCOSEU NEGATIVE 09/23/2011 1032   HGBUR NEGATIVE 01/16/2012 1008   HGBUR negative 09/26/2008 0908   BILIRUBINUR NEGATIVE 01/16/2012 1008   KETONESUR NEGATIVE 01/16/2012 1008   PROTEINUR NEGATIVE 01/16/2012 1008   UROBILINOGEN 1.0 01/16/2012 1008   NITRITE NEGATIVE 01/16/2012 1008   LEUKOCYTESUR NEGATIVE 01/16/2012 1008   Sepsis Labs Invalid input(s): PROCALCITONIN,  WBC,  LACTICIDVEN Microbiology Recent Results (from the past 240 hour(s))  Aerobic/Anaerobic Culture (surgical/deep wound)     Status: None   Collection Time: 09/21/20  4:47 PM   Specimen: Pleural Fluid  Result Value Ref Range Status   Specimen Description   Final    PLEURAL DRAIN Performed at [facility], [place] [address] [place], [place] [address]    Special Requests   Final    NONE Performed at [facility], [place] [address], [place], [place] [address]    Gram Stain   Final    RARE WBC PRESENT,BOTH PMN AND MONONUCLEAR NO ORGANISMS SEEN    Culture   Final    RARE PROPIONIBACTERIUM ACNES Standardized susceptibility testing for this organism is not available. Performed at [facility], [place] [address], [place], [place] [address]    Report Status 09/28/2020 FINAL  Final     Time coordinating discharge:  I have spent 35 minutes face to face with the patient and on the ward discussing the patients care, assessment, plan and disposition with other care givers. >50% of the time was devoted counseling the patient about the risks and  benefits of treatment/Discharge disposition and coordinating care.    SIGNED:   Damita Lack, MD  [facility] 09/28/2020, 3:37 PM   If 7PM-7AM, please contact night-coverage

## 2020-09-28 NOTE — Evaluation (Signed)
Occupational Therapy Evaluation Patient Details Name: Adam Briggs MRN: [id number] DOB: 1929/12/27 Today's Date: 09/28/2020    History of Present Illness 87 yo male presents with c/o dyspnea and generalized weakness. X-ray showed large loculated right pleural effusion.  CT chest confirmed loculated right pleural effusion with irregular pleural thickening suspicious for neoplastic process, moderate left pleural effusion. R chest tube placed 09/21/20, removed 09/27/20. PMH: HTN, dyslipidemia, aortic stenosis, diabetes, prostate CA   Clinical Impression   Pt admitted with the above diagnoses. Pt appears to be close to baseline with basic ADLs. Did discuss LB ADL strategies for comfort as well as energy conservation education as he is transitioning back into his daily routine. Daughter present throughout session. No further OT needs identified. OT signing off.     Follow Up Recommendations  No OT follow up;Supervision - Intermittent    Equipment Recommendations  None recommended by OT    Recommendations for Other Services       Precautions / Restrictions Precautions Precautions: None Restrictions Weight Bearing Restrictions: No      Mobility Bed Mobility Overal bed mobility: Independent             General bed mobility comments: comes to sitting EOB from flat bed    Transfers Overall transfer level: Modified independent Equipment used: Rolling walker (2 wheeled)                  Balance Overall balance assessment: No apparent balance deficits (not formally assessed)                                         ADL either performed or assessed with clinical judgement   ADL Overall ADL's : Needs assistance/impaired Eating/Feeding: Set up;Sitting   Grooming: Set up;Sitting   Upper Body Bathing: Set up;Sitting   Lower Body Bathing: Supervison/ safety;Sit to/from stand;Set up   Upper Body Dressing : Set up;Sitting   Lower Body Dressing: Set  up;Supervision/safety   Toilet Transfer: Modified Independent   Toileting- Clothing Manipulation and Hygiene: Modified independent   Tub/ Shower Transfer: Modified independent   Functional mobility during ADLs: Modified independent;Rolling walker General ADL Comments: Tolerated OOB activity well. Discussed energy conservation strategies as he is transisitioning back home. Daughter present. Also discussed strategies for LB ADLs for comfort.     Vision         Perception     Praxis      Pertinent Vitals/Pain Pain Assessment: No/denies pain     Hand Dominance     Extremity/Trunk Assessment Upper Extremity Assessment Upper Extremity Assessment: Generalized weakness;Overall WFL for tasks assessed   Lower Extremity Assessment Lower Extremity Assessment: Defer to PT evaluation   Cervical / Trunk Assessment Cervical / Trunk Assessment: Normal   Communication Communication Communication: HOH   Cognition Arousal/Alertness: Awake/alert Behavior During Therapy: WFL for tasks assessed/performed Overall Cognitive Status: Within Functional Limits for tasks assessed                                     General Comments  Pt on RA with SpO2 94-98%, HR 137 max noted with ambulation.    Exercises     Shoulder Instructions      Home Living Family/patient expects to be discharged to:: Private residence Living Arrangements: Alone Available Help at Discharge: Family;Available  PRN/intermittently Type of Home: House Home Access: Stairs to enter [employer] of Steps: 2 Entrance Stairs-Rails: None Home Layout: One level               Home Equipment: Walker - 4 wheels;Hand held shower head;Grab bars - tub/shower;Shower seat          Prior Functioning/Environment Level of Independence: Independent        Comments: Pt reports independent with ambulation, ADLs/IADLs, drives, does yard work. Pt's daughter lives on pt's property and completes  grocery shopping for pt due to covid.        OT Problem List:        OT Treatment/Interventions:      OT Goals(Current goals can be found in the care plan section) Acute Rehab OT Goals Patient Stated Goal: return home, no therapy  OT Frequency:     Barriers to D/C:            Co-evaluation              AM-PAC OT "6 Clicks" Daily Activity     Outcome Measure Help from another person eating meals?: None Help from another person taking care of personal grooming?: None Help from another person toileting, which includes using toliet, bedpan, or urinal?: None Help from another person bathing (including washing, rinsing, drying)?: A Little Help from another person to put on and taking off regular upper body clothing?: None Help from another person to put on and taking off regular lower body clothing?: None 6 Click Score: 23   End of Session Equipment Utilized During Treatment: Rolling walker  Activity Tolerance: Patient tolerated treatment well Patient left: in bed;with call bell/phone within reach;with family/visitor present  OT Visit Diagnosis: Pain;Unsteadiness on feet (R26.81)                Time: 1011-1020 OT Time Calculation (min): 9 min Charges:  OT General Charges $OT Visit: 1 Visit OT Evaluation $OT Eval Low Complexity: [place], OT Acute Rehabilitation Services Pager: [phone] Office: [phone]   Hortencia Pilar 09/28/2020, 1:09 PM

## 2020-09-28 NOTE — Evaluation (Signed)
Physical Therapy Evaluation Only Patient Details Name: Adam Briggs MRN: [id number] DOB: 1929-06-09 Today's Date: 09/28/2020   History of Present Illness  87 yo male presents with c/o dyspnea and generalized weakness. X-ray showed large loculated right pleural effusion.  CT chest confirmed loculated right pleural effusion with irregular pleural thickening suspicious for neoplastic process, moderate left pleural effusion. R chest tube placed 09/21/20, removed 09/27/20. PMH: HTN, dyslipidemia, aortic stenosis, diabetes, prostate CA    Clinical Impression  Pt independent without DME at baseline, completes own yardwork, daughter lives on pt's property and completes grocery shopping due to covid. Pt currently ambulating in hallway with RW, no LOB, continues conversation, denies dizziness, pain, or any other complaints. Pt and pt's daughter agree pt will be able to progress back to baseline independently at home. All education completed and questions answered. No acute needs identified, will sign off at this time.    Follow Up Recommendations No PT follow up    Equipment Recommendations  None recommended by PT    Recommendations for Other Services       Precautions / Restrictions Precautions Precautions: None Restrictions Weight Bearing Restrictions: No      Mobility  Bed Mobility Overal bed mobility: Independent  General bed mobility comments: comes to sitting EOB from flat bed    Transfers Overall transfer level: Modified independent Equipment used: Rolling walker (2 wheeled)   Ambulation/Gait Ambulation/Gait assistance: Modified independent (Device/Increase time) Gait Distance (Feet): 200 Feet Assistive device: Rolling walker (2 wheeled) Gait Pattern/deviations: WFL(Within Functional Limits) Gait velocity: WFL   General Gait Details: pt ambulates in hallway completing turns, speed changes, head turns without LOB.  Stairs            Wheelchair Mobility    Modified  Rankin (Stroke Patients Only)       Balance Overall balance assessment: No apparent balance deficits (not formally assessed)         Pertinent Vitals/Pain Pain Assessment: No/denies pain    Home Living Family/patient expects to be discharged to:: Private residence Living Arrangements: Alone Available Help at Discharge: Family;Available PRN/intermittently Type of Home: House Home Access: Stairs to enter Entrance Stairs-Rails: None Entrance Stairs-Number of Steps: 2 Home Layout: One level Home Equipment: Walker - 4 wheels      Prior Function Level of Independence: Independent         Comments: Pt reports independent with ambulation, ADLs/IADLs, drives, does yard work. Pt's daughter lives on pt's property and completes grocery shopping for pt due to covid.     Hand Dominance        Extremity/Trunk Assessment   Upper Extremity Assessment Upper Extremity Assessment: Defer to OT evaluation    Lower Extremity Assessment Lower Extremity Assessment: Overall WFL for tasks assessed (AROM WNL, MMT 5/5 throughout, denies numbness/tingling throughout BLE)    Cervical / Trunk Assessment Cervical / Trunk Assessment: Normal  Communication   Communication: HOH  Cognition Arousal/Alertness: Awake/alert Behavior During Therapy: WFL for tasks assessed/performed Overall Cognitive Status: Within Functional Limits for tasks assessed     General Comments General comments (skin integrity, edema, etc.): Pt on RA with SpO2 94-98%, HR 137 max noted with ambulation.    Exercises     Assessment/Plan    PT Assessment Patent does not need any further PT services  PT Problem List         PT Treatment Interventions      PT Goals (Current goals can be found in the Care Plan section)  Acute  Rehab PT Goals Patient Stated Goal: return home, no therapy PT Goal Formulation: All assessment and education complete, DC therapy    Frequency     Barriers to discharge         Co-evaluation               AM-PAC PT "6 Clicks" Mobility  Outcome Measure Help needed turning from your back to your side while in a flat bed without using bedrails?: None Help needed moving from lying on your back to sitting on the side of a flat bed without using bedrails?: None Help needed moving to and from a bed to a chair (including a wheelchair)?: None Help needed standing up from a chair using your arms (e.g., wheelchair or bedside chair)?: None Help needed to walk in hospital room?: None Help needed climbing 3-5 steps with a railing? : None 6 Click Score: 24    End of Session Equipment Utilized During Treatment: Gait belt Activity Tolerance: Patient tolerated treatment well Patient left: in bed;with call bell/phone within reach;with nursing/sitter in room;with family/visitor present;Other (comment) (OT in room) Nurse Communication: Mobility status PT Visit Diagnosis: Other abnormalities of gait and mobility (R26.89)    Time: 0957-1010 PT Time Calculation (min) (ACUTE ONLY): 13 min   Charges:   PT Evaluation $PT Eval Low Complexity: 1 Low           Tori Mallie Linnemann PT, DPT 09/28/20, 11:08 AM

## 2020-09-28 NOTE — Progress Notes (Signed)
NAME:  Adam Briggs, MRN:  [id number], DOB:  1930-03-30, LOS: 52 ADMISSION DATE:  09/15/2020, CONSULTATION DATE:  4/29 REFERRING MD:  Maralyn Sago, CHIEF COMPLAINT:  Pleural effusion and dyspnea   History of Present Illness:  87 year old male, very active, who presented to ED on 4/29 w/ about 1 month h/o progressive SOB & LE edema. Went to PCP who sent to ER for further eval.  In the ER CBC w/out leukocytosis, BNP 238, trop neg, Lactic acid 1.1, CT chest showed large loculated right effusion w/ R lateral pleural thickening, and RLL consolidation, and LUL lung mass w/ mediastinal involvement and small left effusion. There was also small low density foci w/in the right liver. Because of this PCCM was consulted.   Pertinent  Medical History  HTN, Hyperlipidemia, aortic stenosis, BPH, Diabetes, GERD, Prostate cancer s/p seed implant, inguinal hernia   Significant Hospital Events: Including procedures, antibiotic start and stop dates in addition to other pertinent events   . 4/29 Pulm consulted for pleural effusion and dyspnea . 5/04 On RA, pathology returned with no malignant cells; cardiology sign off . 5/05 Rt pig tail catheter placed by IR . 5/09 CT drainage 36ml, chest tube kinked on am exam. New AF. Marland Kitchen 5/10 CT to water seal, 140ml drainage in last 24 hours  . 5/11 CXR unchanged, chest tube removed  Tests:   CT chest 4/29 >> loculated Rt pleural effusion, pleural thickening at Rt lateral lung base, mass like consolidation RLL likely rounded ATX, 2 cm irregular mass LUL, low density foci in Rt liver concerning for metastatic lesions, splenomegaly  Rt pleural fluid 4/30 >> glucose 91, LDH 134, protein < 3, WBC 339 (92% lymphocytes), cytology negative for malignancy  CT chest 5/04 >> moderate to large Rt complex loculated effusion, pleural thickening, LUL mass 2.3 x 1.6 cm  Echo 5/04 >> EF 50 to 55%, mild LVH, mod AS  Interim History / Subjective:  Breathing okay.  No cough.  No chest  pain.  Walked in hallway on room air w/o difficulty.  Objective   Blood pressure 121/73, pulse 62, temperature 97.7 F (36.5 C), resp. rate 18, weight 69.4 kg, SpO2 97 %.        Intake/Output Summary (Last 24 hours) at 09/28/2020 1104 Last data filed at 09/28/2020 3007 Gross per 24 hour  Intake 360 ml  Output 1050 ml  Net -690 ml   Filed Weights   09/25/20 0300 09/26/20 0500 09/27/20 0500  Weight: 70.6 kg 70.3 kg 69.4 kg    Examination:  General - alert Eyes - pupils reactive ENT - no sinus tenderness, no stridor Cardiac - regular rate/rhythm, 2/6 SM Chest - decreased BS Rt base, no wheeze Abdomen - soft, non tender, + bowel sounds Extremities - no cyanosis, clubbing, or edema Skin - no rashes Neuro - normal strength, moves extremities, follows commands Psych - normal mood and behavior   DG Chest 2 View  Result Date: 09/28/2020 CLINICAL DATA:  Prostate cancer, shortness of breath EXAM: CHEST - 2 VIEW COMPARISON:  Portable exam 1000 hours compared to 09/27/2020 FINDINGS: Persistent moderate-sized RIGHT hydropneumothorax and RIGHT lung atelectasis. Normal heart size, mediastinal contours, and pulmonary vascularity. LEFT lung clear with minimal effusion blunting the posterior costophrenic angle. No acute osseous findings. IMPRESSION: Persistent moderate RIGHT hydropneumothorax and RIGHT lung atelectasis. Minimal LEFT pleural effusion. Electronically Signed   By: Lavonia Dana M.D.   On: 09/28/2020 10:33   DG Chest 2 View  Addendum  Date: 09/26/2020   ADDENDUM REPORT: 09/26/2020 13:40 ADDENDUM: Critical Value/emergent results were called by telephone at the time of interpretation on 09/26/2020 at 1:40 pm to provider [name] , who verbally acknowledged these results. Electronically Signed   By: Lowella Grip III M.D.   On: 09/26/2020 13:40   Result Date: 09/26/2020 CLINICAL DATA:  Pleural effusion EXAM: CHEST - 2 VIEW COMPARISON:  Sep 25, 2020 FINDINGS: Chest tube on the right  is again noted with pneumothorax on the right larger than 1 day prior. Loculated pleural effusion in the right base noted. There is atelectatic change in the right lung. Left lung is clear. Heart is upper normal in size. The pulmonary vascularity on the left is normal. Vascularity on the right is somewhat distorted due to pneumothorax and atelectasis. No adenopathy evident. No bone lesions. IMPRESSION: Pneumothorax on the right has increased in size without tension component. Loculated pleural effusion again noted on the right with areas of atelectatic change in the right lung. Left lung is clear. Stable cardiac silhouette. Electronically Signed: By: Lowella Grip III M.D. On: 09/26/2020 13:33   DG CHEST PORT 1 VIEW  Result Date: 09/27/2020 CLINICAL DATA:  Chest tube removal EXAM: PORTABLE CHEST 1 VIEW COMPARISON:  09/27/2020, 09/26/2020 CT 09/20/2020 FINDINGS: Removal of right-sided chest tube. Grossly stable moderate size right hydropneumothorax. Airspace disease within the right lung parenchyma. Small left-sided effusion with improved aeration left base. Stable cardiomediastinal silhouette. IMPRESSION: 1. Removal of right chest tube with grossly similar size of moderate right hydropneumothorax. Atelectasis and airspace disease of the right lung. 2. Small left effusion with slightly improved aeration at left lung base. Electronically Signed   By: Donavan Foil M.D.   On: 09/27/2020 17:14   DG CHEST PORT 1 VIEW  Result Date: 09/27/2020 CLINICAL DATA:  Pneumothorax. EXAM: PORTABLE CHEST 1 VIEW COMPARISON:  Chest x-ray 09/26/2020, CT chest 09/20/2020 FINDINGS: The heart size and mediastinal contours are unchanged Persistent airspace opacity of the right lung. No focal consolidation within the left lung. No pulmonary edema. No pleural effusion. Similar-appearing small to moderate volume right pneumothorax. Right pigtail in similar position overlying the right peripheral pleural space. Fluid also noted  within the right pleural space. No definite left pneumothorax. Question trace left pleural effusion. No acute osseous abnormality. IMPRESSION: 1. Similar-appearing small to moderate volume right hydropneumothorax with right chest tube in similar position. 2. Persistent underlying airspace opacity of the right lung. 3. Question possible trace left pleural effusion. Electronically Signed   By: Iven Finn M.D.   On: 09/27/2020 06:57   DG CHEST PORT 1 VIEW  Result Date: 09/26/2020 CLINICAL DATA:  Right-sided pneumothorax with a chest tube in place. EXAM: PORTABLE CHEST 1 VIEW COMPARISON:  Chest radiograph performed the same day. FINDINGS: A moderate right pneumothorax appears slightly decreased from prior exam. A right-sided pleural pigtail catheter is unchanged in position. Loculated pleural fluid on the right appears unchanged. There is associated atelectasis/airspace disease which is unchanged. The left lung is clear. There is no left pleural effusion or pneumothorax. The cardiac silhouette is obscured and appears unchanged. IMPRESSION: Slightly decreased moderate right pneumothorax. Electronically Signed   By: Zerita Boers M.D.   On: 09/26/2020 16:48    Assessment & Plan:   Trapped lung on Right with pleural effusion. - cytology negative x two - explained to pt and family that pleural fluid will re-accumulate to fill in empty space - discussed options of decortication, and explained the concern about him having  this type of procedure especially since he has clinically improved otherwise  Lt upper lung mass with lymphadenopathy and hypodense liver lesions. - he has follow up with Dr. [name] scheduled for 10/02/20 at 3 pm - he will need to get PET scan scheduled as an outpt  New onset atrial fibrillation, aortic stenosis. - suspect left pleural effusion was related to cardiac issues and has improved - f/u with cardiology  Stable for discharge home.  Updated pt's daughter at bedside.  Messaged  Dr. Reesa Chew about pulmonary plan.  Signature:  Chesley Mires, MD [facility] Pager - [phone] 09/28/2020, 11:09 AM

## 2020-10-02 ENCOUNTER — Encounter: Payer: Self-pay | Admitting: Pulmonary Disease

## 2020-10-02 ENCOUNTER — Telehealth: Payer: Self-pay | Admitting: *Deleted

## 2020-10-02 ENCOUNTER — Encounter (HOSPITAL_COMMUNITY)
Admission: RE | Admit: 2020-10-02 | Discharge: 2020-10-02 | Disposition: A | Discharge status: Home / Self Care | Payer: Medicare Other | Source: Ambulatory Visit | Attending: Pulmonary Disease | Admitting: Pulmonary Disease

## 2020-10-02 ENCOUNTER — Telehealth: Payer: Self-pay | Admitting: Cardiology

## 2020-10-02 ENCOUNTER — Ambulatory Visit (INDEPENDENT_AMBULATORY_CARE_PROVIDER_SITE_OTHER): Payer: Medicare Other | Admitting: Pulmonary Disease

## 2020-10-02 ENCOUNTER — Other Ambulatory Visit: Payer: Self-pay

## 2020-10-02 VITALS — BP 120/80 | HR 110 | Temp 98.6°F | Ht 70.0 in | Wt 158.6 lb

## 2020-10-02 DIAGNOSIS — R911 Solitary pulmonary nodule: Secondary | ICD-10-CM

## 2020-10-02 DIAGNOSIS — J90 Pleural effusion, not elsewhere classified: Secondary | ICD-10-CM

## 2020-10-02 DIAGNOSIS — D3502 Benign neoplasm of left adrenal gland: Secondary | ICD-10-CM | POA: Insufficient documentation

## 2020-10-02 DIAGNOSIS — D3501 Benign neoplasm of right adrenal gland: Secondary | ICD-10-CM | POA: Insufficient documentation

## 2020-10-02 DIAGNOSIS — Z7901 Long term (current) use of anticoagulants: Secondary | ICD-10-CM

## 2020-10-02 DIAGNOSIS — R918 Other nonspecific abnormal finding of lung field: Secondary | ICD-10-CM | POA: Insufficient documentation

## 2020-10-02 DIAGNOSIS — J9811 Atelectasis: Secondary | ICD-10-CM | POA: Diagnosis not present

## 2020-10-02 DIAGNOSIS — R599 Enlarged lymph nodes, unspecified: Secondary | ICD-10-CM

## 2020-10-02 LAB — GLUCOSE, CAPILLARY: Glucose-Capillary: 110 mg/dL — ABNORMAL HIGH (ref 70–99)

## 2020-10-02 MED ORDER — FLUDEOXYGLUCOSE F - 18 (FDG) INJECTION
7.1000 | Freq: Once | INTRAVENOUS | Status: AC | PRN
  Administered 2020-10-02: 7.5 via INTRAVENOUS

## 2020-10-02 NOTE — Telephone Encounter (Signed)
   Name: Adam Briggs  DOB: Feb 20, 1930  MRN: [id number]  Primary Cardiologist: None  Chart reviewed as part of pre-operative protocol coverage. Because of Adam Briggs's past medical history and time since last visit, he will require a follow-up visit in order to better assess preoperative cardiovascular risk.  PT has an appt with Dr. Percival Spanish to re-establish care and for hospital follow up on 11/03/20. He also has an appt with Roderic Palau in the Afib clinic on 10/05/20. I will defer preop clearance to D. Kayleen Memos as this patient was just discharged.   Pre-op covering staff: - Please schedule appointment and call patient to inform them. If patient already had an upcoming appointment within acceptable timeframe, please add "pre-op clearance" to the appointment notes so provider is aware. - Please contact requesting surgeon's office via preferred method (i.e, phone, fax) to inform them of need for appointment prior to surgery.  If applicable, this message will also be routed to pharmacy pool and/or primary cardiologist for input on holding anticoagulant/antiplatelet agent as requested below so that this information is available to the clearing provider at time of patient's appointment.   Ledora Bottcher, PA  10/02/2020, 4:36 PM

## 2020-10-02 NOTE — H&P (View-Only) (Signed)
Synopsis: Referred in May 2022 for abnormal CT chest, abnormal PET scan by [name], *  Subjective:   PATIENT ID: Adam Briggs GENDER: male DOB: Jan 31, 1930, MRN: [id number]  Chief Complaint  Patient presents with  . Hospitalization Follow-up    Post hospital visit.  PET scan this am.    This is an 87 year old gentleman, former smoker quit in 1968, approximate 17-pack-year history.  Patient has a past medical history of reflux, diabetes, hypertension, hypercholesterolemia, prostate cancer.  Patient was recently admitted to the hospital with a right-sided loculated pleural effusion.  Patient was discharged from the hospital on 09/28/2020.  In the hospital the patient was found to have right-sided loculated effusion with pleural thickening.  Patient underwent pigtail catheter placement after a thoracentesis on 09/16/2020.  Right-sided loculated effusion with a left upper lobe nodule and mediastinal adenopathy there was concern for malignancy.  There is also a hypodense lesion within the liver.  Recommended outpatient PET scan.  Cytology was sent off from the pleural fluid.  Both cytologies were negative.  No evidence of malignancy.  Additionally patient's pleural fluid on 09/16/2020 revealed 339 nucleated cells and 92% lymphocytes.  Patient was discharged from the hospital with a ex vacuo pneumothorax on chest x-ray.  A PET scan was completed on 10/02/2020.  This revealed a 21 mm left upper lobe hypermetabolic lung mass with an SUV max of 11.5 concerning for primary bronchogenic carcinoma.  Additionally there was hypermetabolic subcarinal lymph nodes concerning for metastatic disease.  And small bilateral paratracheal adenopathy with low-level hypermetabolic uptake.  There was what was considered a right-sided loculated hydropneumothorax.  This looks stable in comparison to previous images during his hospitalization.  From a respiratory standpoint the patient is doing well has no significant  complaints.  He is on Eliquis for atrial fibrillation.  He has not had general anesthesia in some time.  Has a follow-up scheduled with cardiology soon.    Past Medical History:  Diagnosis Date  . BENIGN PROSTATIC HYPERTROPHY   . Cataract   . DIVERTICULOSIS, COLON   . DM   . GERD   . Heart murmur   . Hernia   . HYPERCHOLESTEROLEMIA   . HYPERTENSION   . Moderate aortic stenosis 10/30/2011   echo 2013  . Prostate cancer [facility])    s/p seed implant     Family History  Problem Relation Age of Onset  . Heart attack Paternal Uncle        in his 81s  . Atrial fibrillation Daughter   . Colon cancer Neg Hx      Past Surgical History:  Procedure Laterality Date  . CATARACT EXTRACTION W/ INTRAOCULAR LENS IMPLANT  02/2013   R, then L 4 weeks later - [place]  . INGUINAL HERNIA REPAIR     Right  . INGUINAL HERNIA REPAIR Left 07/15/2014   Procedure: LEFT INGUINAL HERNIA REPAIR WITH MESH;  Surgeon: Armandina Gemma, MD;  Location: [facility];  Service: General;  Laterality: Left;  . INSERTION OF MESH Left 07/15/2014   Procedure: INSERTION OF MESH;  Surgeon: Armandina Gemma, MD;  Location: [facility];  Service: General;  Laterality: Left;  . RADIOACTIVE SEED IMPLANT      Social History   Socioeconomic History  . Marital status: Widowed    Spouse name: Not on file  . Number of children: 2  . Years of education: Not on file  . Highest education level: Not on file  Occupational History  . Occupation:  Retired     Comment: Retired  Tobacco Use  . Smoking status: Former Smoker    Packs/day: 0.50    Years: 35.00    Pack years: 17.50    Types: Cigarettes    Quit date: 10/30/1966    Years since quitting: 53.9  . Smokeless tobacco: Never Used  Substance and Sexual Activity  . Alcohol use: No  . Drug use: No  . Sexual activity: Not on file  Other Topics Concern  . Not on file  Social History Narrative   Pt says his diet is excellent   Daily caffeine    Social Determinants of Health   Financial  Resource Strain: Not on file  Food Insecurity: Not on file  Transportation Needs: Not on file  Physical Activity: Not on file  Stress: Not on file  Social Connections: Not on file  Intimate Partner Violence: Not on file     Allergies  Allergen Reactions  . Lisinopril     REACTION: Rash     Outpatient Medications Prior to Visit  Medication Sig Dispense Refill  . apixaban (ELIQUIS) 5 MG TABS tablet Take 1 tablet (5 mg total) by mouth 2 (two) times daily. 60 tablet 0  . aspirin EC 81 MG tablet Take 81 mg by mouth daily.    . fluticasone (FLONASE) 50 MCG/ACT nasal spray Place 2 sprays into both nostrils daily. 16 g 6  . furosemide (LASIX) 40 MG tablet Take 1 tablet (40 mg total) by mouth daily. 30 tablet 0  . metoprolol tartrate (LOPRESSOR) 25 MG tablet Take 1 tablet (25 mg total) by mouth 2 (two) times daily. 60 tablet 0  . Multiple Vitamin (MULTIVITAMIN WITH MINERALS) TABS tablet Take 1 tablet by mouth daily.    . simvastatin (ZOCOR) 20 MG tablet Take 1 tablet (20 mg total) by mouth daily at 6 PM. 90 tablet 3  . tamsulosin (FLOMAX) 0.4 MG CAPS capsule Take 0.4 mg by mouth daily.    Marland Kitchen albuterol (VENTOLIN HFA) 108 (90 Base) MCG/ACT inhaler INHALE 1 TO 2 PUFFS BY MOUTH EVERY 6 HOURS AS NEEDED FOR WHEEZING FOR SHORTNESS OF BREATH (Patient not taking: Reported on 10/02/2020) 9 g 0  . senna-docusate (SENOKOT-S) 8.6-50 MG tablet Take 1 tablet by mouth at bedtime as needed for mild constipation or moderate constipation. (Patient not taking: Reported on 10/02/2020) 30 tablet 1   No facility-administered medications prior to visit.    Review of Systems  Constitutional: Negative for chills, fever, malaise/fatigue and weight loss.  HENT: Negative for hearing loss, sore throat and tinnitus.   Eyes: Negative for blurred vision and double vision.  Respiratory: Positive for cough and shortness of breath. Negative for hemoptysis, sputum production, wheezing and stridor.   Cardiovascular: Negative for  chest pain, palpitations, orthopnea, leg swelling and PND.  Gastrointestinal: Negative for abdominal pain, constipation, diarrhea, heartburn, nausea and vomiting.  Genitourinary: Negative for dysuria, hematuria and urgency.  Musculoskeletal: Negative for joint pain and myalgias.  Skin: Negative for itching and rash.  Neurological: Negative for dizziness, tingling, weakness and headaches.  Endo/Heme/Allergies: Negative for environmental allergies. Does not bruise/bleed easily.  Psychiatric/Behavioral: Negative for depression. The patient is not nervous/anxious and does not have insomnia.   All other systems reviewed and are negative.    Objective:  Physical Exam Vitals reviewed.  Constitutional:      General: He is not in acute distress.    Appearance: He is well-developed.  HENT:     Head: Normocephalic and atraumatic.  Eyes:     General: No scleral icterus.    Conjunctiva/sclera: Conjunctivae normal.     Pupils: Pupils are equal, round, and reactive to light.  Neck:     Vascular: No JVD.     Trachea: No tracheal deviation.  Cardiovascular:     Rate and Rhythm: Normal rate and regular rhythm.     Heart sounds: Normal heart sounds. No murmur heard.   Pulmonary:     Effort: Pulmonary effort is normal. No tachypnea, accessory muscle usage or respiratory distress.     Breath sounds: No stridor. No wheezing, rhonchi or rales.     Comments: Diminished breath sounds in the right base and compared to the left Abdominal:     General: Bowel sounds are normal. There is no distension.     Palpations: Abdomen is soft.     Tenderness: There is no abdominal tenderness.  Musculoskeletal:        General: No tenderness.     Cervical back: Neck supple.  Lymphadenopathy:     Cervical: No cervical adenopathy.  Skin:    General: Skin is warm and dry.     Capillary Refill: Capillary refill takes less than 2 seconds.     Findings: No rash.  Neurological:     Mental Status: He is alert and  oriented to person, place, and time.  Psychiatric:        Behavior: Behavior normal.      Vitals:   10/02/20 1458  BP: 120/80  Pulse: (!) 110  Temp: 98.6 F (37 C)  TempSrc: Temporal  SpO2: 96%  Weight: 158 lb 9.6 oz (71.9 kg)  Height: 5\' 10"  (1.778 m)   96% on RA BMI Readings from Last 3 Encounters:  10/02/20 22.76 kg/m  09/27/20 21.95 kg/m  09/15/20 24.16 kg/m   Wt Readings from Last 3 Encounters:  10/02/20 158 lb 9.6 oz (71.9 kg)  09/27/20 153 lb (69.4 kg)  09/15/20 168 lb 6.4 oz (76.4 kg)     CBC    Component Value Date/Time   WBC 5.6 09/25/2020 0431   RBC 4.56 09/25/2020 0431   HGB 11.6 (L) 09/25/2020 0431   HCT 37.5 (L) 09/25/2020 0431   PLT 229 09/25/2020 0431   MCV 82.2 09/25/2020 0431   MCH 25.4 (L) 09/25/2020 0431   MCHC 30.9 09/25/2020 0431   RDW 16.1 (H) 09/25/2020 0431   LYMPHSABS 0.7 09/25/2020 0431   MONOABS 0.7 09/25/2020 0431   EOSABS 0.1 09/25/2020 0431   BASOSABS 0.0 09/25/2020 0431     Chest Imaging:  Nuclear medicine pet imaging: 3/41/9622 Hypermetabolic left upper lobe lesion SUV max 11 concerning for primary bronchogenic carcinoma, associated mediastinal subcarinal adenopathy with elevated SUV concerning for metastasis. There is hypermetabolic uptake within the pleural region with the associated hot loculated hydropneumothorax. The patient's images have been independently reviewed by me.    Pulmonary Functions Testing Results: No flowsheet data found.  FeNO:   Pathology:     Echocardiogram:   Heart Catheterization:     Assessment & Plan:     ICD-10-CM   1. Lung nodule  R91.1 Procedural/ Surgical Case Request: VIDEO BRONCHOSCOPY WITH ENDOBRONCHIAL NAVIGATION, VIDEO BRONCHOSCOPY WITH ENDOBRONCHIAL ULTRASOUND, INSERTION PLEURAL DRAINAGE CATHETER    Ambulatory referral to Pulmonology    CANCELED: Ambulatory referral to Pulmonology  2. Pleural effusion  J90 Procedural/ Surgical Case Request: VIDEO BRONCHOSCOPY WITH  ENDOBRONCHIAL NAVIGATION, VIDEO BRONCHOSCOPY WITH ENDOBRONCHIAL ULTRASOUND, INSERTION PLEURAL DRAINAGE CATHETER    Ambulatory referral to  Pulmonology    CANCELED: Ambulatory referral to Pulmonology  3. Adenopathy  R59.9   4. On continuous oral anticoagulation  Z79.01     Discussion:  This is a 87 year old gentleman, atrial fibrillation on apixaban, aspirin 81 mg.  Recently admitted to the hospital for a loculated right-sided pleural effusion status postthoracentesis which reveals lymphocyte predominance, low protein, elevated LDH, normal glucose cultures negative.  Cytology negative x2 for malignancy.  Plan: We discussed all of the risk benefits and alternatives of consideration for tissue diagnosis in this gentleman. He is 87 years old on anticoagulation.  I do think cardiac clearance would be prudent before undergoing general anesthesia. We also discussed the recurrence of his right-sided effusion.  It does appear that some fluid has returned to that space.  I suspect his lung is trapped but he does feel when it is was full he was short of breath.  I think the best option for treatment of the pleural effusion would be Pleurx catheter placement.  We have improved that it is a malignant effusion but with what is going on in the chest and the way it appears on CT and pet imaging I would suggest is probably a malignant effusion.  Even if it was and were unable to approve I do not think he would tolerate having thoracoscopy by thoracic surgery.  As for the mediastinal adenopathy and the nodule we would need to consider video bronchoscopy with tissue sampling to confirm a tissue diagnosis of malignancy. I think we should approach this in a stepwise fashion with placement of a indwelling pleural catheter followed by video bronchoscopy with endobronchial ultrasound and biopsy of the station 7 node.  That and of itself could be enlarged and positive on PET scan just due to inflammation and we could be  dealing with a primary left upper lobe malignancy versus metastatic disease to the mediastinum.  We will reach out to his cardiologist and recommend evaluation before undergoing general anesthesia. We will tentatively schedule the patient for 10/17/2020.  We discussed the risk benefits and alternatives to include bleeding, pneumothorax. Patient will need to be off of Eliquis 2 full days before considering procedure. If patient's procedure is able to be scheduled on 10/17/2020 the patient will need to stop Eliquis on Saturday.  We have requested extension of anesthesia block time for 10/17/2020.    Current Outpatient Medications:  .  apixaban (ELIQUIS) 5 MG TABS tablet, Take 1 tablet (5 mg total) by mouth 2 (two) times daily., Disp: 60 tablet, Rfl: 0 .  aspirin EC 81 MG tablet, Take 81 mg by mouth daily., Disp: , Rfl:  .  fluticasone (FLONASE) 50 MCG/ACT nasal spray, Place 2 sprays into both nostrils daily., Disp: 16 g, Rfl: 6 .  furosemide (LASIX) 40 MG tablet, Take 1 tablet (40 mg total) by mouth daily., Disp: 30 tablet, Rfl: 0 .  metoprolol tartrate (LOPRESSOR) 25 MG tablet, Take 1 tablet (25 mg total) by mouth 2 (two) times daily., Disp: 60 tablet, Rfl: 0 .  Multiple Vitamin (MULTIVITAMIN WITH MINERALS) TABS tablet, Take 1 tablet by mouth daily., Disp: , Rfl:  .  simvastatin (ZOCOR) 20 MG tablet, Take 1 tablet (20 mg total) by mouth daily at 6 PM., Disp: 90 tablet, Rfl: 3 .  tamsulosin (FLOMAX) 0.4 MG CAPS capsule, Take 0.4 mg by mouth daily., Disp: , Rfl:  .  albuterol (VENTOLIN HFA) 108 (90 Base) MCG/ACT inhaler, INHALE 1 TO 2 PUFFS BY MOUTH EVERY 6 HOURS AS NEEDED  FOR WHEEZING FOR SHORTNESS OF BREATH (Patient not taking: Reported on 10/02/2020), Disp: 9 g, Rfl: 0 .  senna-docusate (SENOKOT-S) 8.6-50 MG tablet, Take 1 tablet by mouth at bedtime as needed for mild constipation or moderate constipation. (Patient not taking: Reported on 10/02/2020), Disp: 30 tablet, Rfl: 1  I spent 42 minutes  dedicated to the care of this patient on the date of this encounter to include pre-visit review of records, face-to-face time with the patient discussing conditions above, post visit ordering of testing, clinical documentation with the electronic health record, making appropriate referrals as documented, and communicating necessary findings to members of the patients care team.   Garner Nash, [facility] 10/02/2020 5:42 PM

## 2020-10-02 NOTE — Telephone Encounter (Signed)
Called and spoke with Magda Paganini with Dr. Rosezella [place] office regarding cardiology clearance prior to bronch on 10/17/20. Provided name of practice, provider, procedure, and type of anesthesia.   Advised she will get the information to the preop team.  Nothing further needed at this time.

## 2020-10-02 NOTE — Telephone Encounter (Signed)
   [facility] Pre-operative Risk Assessment    Request for surgical clearance:  1. What type of surgery is being performed? Broncoscopy  2. When is this surgery scheduled? 10/17/2020  3. What type of clearance is required (medical clearance vs. Pharmacy clearance to hold med vs. Both)? medical  4. Are there any medications that need to be held prior to surgery and how long? Pt has already been instructed to hold Eliquis 2 days prior 5. Practice name and name of physician performing surgery? Dr. Rosel[facility]  6. What is your office phone number [phone]   7.   What is your office fax number [phone]  8.   Anesthesia type (None, local, MAC, general) ? [place] 10/02/2020, 4:04 PM  _________________________________________________________________   (provider comments below)

## 2020-10-02 NOTE — Telephone Encounter (Signed)
Per per op provider , pre op clearance to be assessed at appt with Roderic Palau, NP 10/05/20, see notes. I will forward clearance notes to NP for upcoming appt. I will send FYI to requesting office, pt has appt 10/05/20.

## 2020-10-02 NOTE — Progress Notes (Signed)
Synopsis: Referred in May 2022 for abnormal CT chest, abnormal PET scan by [name], *  Subjective:   PATIENT ID: Adam Briggs GENDER: male DOB: [birth year], MRN: [id number]  Chief Complaint  Patient presents with  . Hospitalization Follow-up    Post hospital visit.  PET scan this am.    This is an 87 year old gentleman, former smoker quit in 1968, approximate 17-pack-year history.  Patient has a past medical history of reflux, diabetes, hypertension, hypercholesterolemia, prostate cancer.  Patient was recently admitted to the hospital with a right-sided loculated pleural effusion.  Patient was discharged from the hospital on 09/28/2020.  In the hospital the patient was found to have right-sided loculated effusion with pleural thickening.  Patient underwent pigtail catheter placement after a thoracentesis on 09/16/2020.  Right-sided loculated effusion with a left upper lobe nodule and mediastinal adenopathy there was concern for malignancy.  There is also a hypodense lesion within the liver.  Recommended outpatient PET scan.  Cytology was sent off from the pleural fluid.  Both cytologies were negative.  No evidence of malignancy.  Additionally patient's pleural fluid on 09/16/2020 revealed 339 nucleated cells and 92% lymphocytes.  Patient was discharged from the hospital with a ex vacuo pneumothorax on chest x-ray.  A PET scan was completed on 10/02/2020.  This revealed a 21 mm left upper lobe hypermetabolic lung mass with an SUV max of 11.5 concerning for primary bronchogenic carcinoma.  Additionally there was hypermetabolic subcarinal lymph nodes concerning for metastatic disease.  And small bilateral paratracheal adenopathy with low-level hypermetabolic uptake.  There was what was considered a right-sided loculated hydropneumothorax.  This looks stable in comparison to previous images during his hospitalization.  From a respiratory standpoint the patient is doing well has no significant  complaints.  He is on Eliquis for atrial fibrillation.  He has not had general anesthesia in some time.  Has a follow-up scheduled with cardiology soon.    Past Medical History:  Diagnosis Date  . BENIGN PROSTATIC HYPERTROPHY   . Cataract   . DIVERTICULOSIS, COLON   . DM   . GERD   . Heart murmur   . Hernia   . HYPERCHOLESTEROLEMIA   . HYPERTENSION   . Moderate aortic stenosis 10/30/2011   echo 2013  . Prostate cancer [facility])    s/p seed implant     Family History  Problem Relation Age of Onset  . Heart attack Paternal Uncle        in his 14s  . Atrial fibrillation Daughter   . Colon cancer Neg Hx      Past Surgical History:  Procedure Laterality Date  . CATARACT EXTRACTION W/ INTRAOCULAR LENS IMPLANT  02/2013   R, then L 4 weeks later - [place]  . INGUINAL HERNIA REPAIR     Right  . INGUINAL HERNIA REPAIR Left 07/15/2014   Procedure: LEFT INGUINAL HERNIA REPAIR WITH MESH;  Surgeon: Armandina Gemma, MD;  Location: [facility];  Service: General;  Laterality: Left;  . INSERTION OF MESH Left 07/15/2014   Procedure: INSERTION OF MESH;  Surgeon: Armandina Gemma, MD;  Location: [facility];  Service: General;  Laterality: Left;  . RADIOACTIVE SEED IMPLANT      Social History   Socioeconomic History  . Marital status: Widowed    Spouse name: Not on file  . Number of children: 2  . Years of education: Not on file  . Highest education level: Not on file  Occupational History  . Occupation:  Retired     Comment: Retired  Tobacco Use  . Smoking status: Former Smoker    Packs/day: 0.50    Years: 35.00    Pack years: 17.50    Types: Cigarettes    Quit date: 10/30/1966    Years since quitting: 53.9  . Smokeless tobacco: Never Used  Substance and Sexual Activity  . Alcohol use: No  . Drug use: No  . Sexual activity: Not on file  Other Topics Concern  . Not on file  Social History Narrative   Pt says his diet is excellent   Daily caffeine    Social Determinants of Health   Financial  Resource Strain: Not on file  Food Insecurity: Not on file  Transportation Needs: Not on file  Physical Activity: Not on file  Stress: Not on file  Social Connections: Not on file  Intimate Partner Violence: Not on file     Allergies  Allergen Reactions  . Lisinopril     REACTION: Rash     Outpatient Medications Prior to Visit  Medication Sig Dispense Refill  . apixaban (ELIQUIS) 5 MG TABS tablet Take 1 tablet (5 mg total) by mouth 2 (two) times daily. 60 tablet 0  . aspirin EC 81 MG tablet Take 81 mg by mouth daily.    . fluticasone (FLONASE) 50 MCG/ACT nasal spray Place 2 sprays into both nostrils daily. 16 g 6  . furosemide (LASIX) 40 MG tablet Take 1 tablet (40 mg total) by mouth daily. 30 tablet 0  . metoprolol tartrate (LOPRESSOR) 25 MG tablet Take 1 tablet (25 mg total) by mouth 2 (two) times daily. 60 tablet 0  . Multiple Vitamin (MULTIVITAMIN WITH MINERALS) TABS tablet Take 1 tablet by mouth daily.    . simvastatin (ZOCOR) 20 MG tablet Take 1 tablet (20 mg total) by mouth daily at 6 PM. 90 tablet 3  . tamsulosin (FLOMAX) 0.4 MG CAPS capsule Take 0.4 mg by mouth daily.    Marland Kitchen albuterol (VENTOLIN HFA) 108 (90 Base) MCG/ACT inhaler INHALE 1 TO 2 PUFFS BY MOUTH EVERY 6 HOURS AS NEEDED FOR WHEEZING FOR SHORTNESS OF BREATH (Patient not taking: Reported on 10/02/2020) 9 g 0  . senna-docusate (SENOKOT-S) 8.6-50 MG tablet Take 1 tablet by mouth at bedtime as needed for mild constipation or moderate constipation. (Patient not taking: Reported on 10/02/2020) 30 tablet 1   No facility-administered medications prior to visit.    Review of Systems  Constitutional: Negative for chills, fever, malaise/fatigue and weight loss.  HENT: Negative for hearing loss, sore throat and tinnitus.   Eyes: Negative for blurred vision and double vision.  Respiratory: Positive for cough and shortness of breath. Negative for hemoptysis, sputum production, wheezing and stridor.   Cardiovascular: Negative for  chest pain, palpitations, orthopnea, leg swelling and PND.  Gastrointestinal: Negative for abdominal pain, constipation, diarrhea, heartburn, nausea and vomiting.  Genitourinary: Negative for dysuria, hematuria and urgency.  Musculoskeletal: Negative for joint pain and myalgias.  Skin: Negative for itching and rash.  Neurological: Negative for dizziness, tingling, weakness and headaches.  Endo/Heme/Allergies: Negative for environmental allergies. Does not bruise/bleed easily.  Psychiatric/Behavioral: Negative for depression. The patient is not nervous/anxious and does not have insomnia.   All other systems reviewed and are negative.    Objective:  Physical Exam Vitals reviewed.  Constitutional:      General: He is not in acute distress.    Appearance: He is well-developed.  HENT:     Head: Normocephalic and atraumatic.  Eyes:     General: No scleral icterus.    Conjunctiva/sclera: Conjunctivae normal.     Pupils: Pupils are equal, round, and reactive to light.  Neck:     Vascular: No JVD.     Trachea: No tracheal deviation.  Cardiovascular:     Rate and Rhythm: Normal rate and regular rhythm.     Heart sounds: Normal heart sounds. No murmur heard.   Pulmonary:     Effort: Pulmonary effort is normal. No tachypnea, accessory muscle usage or respiratory distress.     Breath sounds: No stridor. No wheezing, rhonchi or rales.     Comments: Diminished breath sounds in the right base and compared to the left Abdominal:     General: Bowel sounds are normal. There is no distension.     Palpations: Abdomen is soft.     Tenderness: There is no abdominal tenderness.  Musculoskeletal:        General: No tenderness.     Cervical back: Neck supple.  Lymphadenopathy:     Cervical: No cervical adenopathy.  Skin:    General: Skin is warm and dry.     Capillary Refill: Capillary refill takes less than 2 seconds.     Findings: No rash.  Neurological:     Mental Status: He is alert and  oriented to person, place, and time.  Psychiatric:        Behavior: Behavior normal.      Vitals:   10/02/20 1458  BP: 120/80  Pulse: (!) 110  Temp: 98.6 F (37 C)  TempSrc: Temporal  SpO2: 96%  Weight: 158 lb 9.6 oz (71.9 kg)  Height: 5\' 10"  (1.778 m)   96% on RA BMI Readings from Last 3 Encounters:  10/02/20 22.76 kg/m  09/27/20 21.95 kg/m  09/15/20 24.16 kg/m   Wt Readings from Last 3 Encounters:  10/02/20 158 lb 9.6 oz (71.9 kg)  09/27/20 153 lb (69.4 kg)  09/15/20 168 lb 6.4 oz (76.4 kg)     CBC    Component Value Date/Time   WBC 5.6 09/25/2020 0431   RBC 4.56 09/25/2020 0431   HGB 11.6 (L) 09/25/2020 0431   HCT 37.5 (L) 09/25/2020 0431   PLT 229 09/25/2020 0431   MCV 82.2 09/25/2020 0431   MCH 25.4 (L) 09/25/2020 0431   MCHC 30.9 09/25/2020 0431   RDW 16.1 (H) 09/25/2020 0431   LYMPHSABS 0.7 09/25/2020 0431   MONOABS 0.7 09/25/2020 0431   EOSABS 0.1 09/25/2020 0431   BASOSABS 0.0 09/25/2020 0431     Chest Imaging:  Nuclear medicine pet imaging: 08/26/1446 Hypermetabolic left upper lobe lesion SUV max 11 concerning for primary bronchogenic carcinoma, associated mediastinal subcarinal adenopathy with elevated SUV concerning for metastasis. There is hypermetabolic uptake within the pleural region with the associated hot loculated hydropneumothorax. The patient's images have been independently reviewed by me.    Pulmonary Functions Testing Results: No flowsheet data found.  FeNO:   Pathology:     Echocardiogram:   Heart Catheterization:     Assessment & Plan:     ICD-10-CM   1. Lung nodule  R91.1 Procedural/ Surgical Case Request: VIDEO BRONCHOSCOPY WITH ENDOBRONCHIAL NAVIGATION, VIDEO BRONCHOSCOPY WITH ENDOBRONCHIAL ULTRASOUND, INSERTION PLEURAL DRAINAGE CATHETER    Ambulatory referral to Pulmonology    CANCELED: Ambulatory referral to Pulmonology  2. Pleural effusion  J90 Procedural/ Surgical Case Request: VIDEO BRONCHOSCOPY WITH  ENDOBRONCHIAL NAVIGATION, VIDEO BRONCHOSCOPY WITH ENDOBRONCHIAL ULTRASOUND, INSERTION PLEURAL DRAINAGE CATHETER    Ambulatory referral to  Pulmonology    CANCELED: Ambulatory referral to Pulmonology  3. Adenopathy  R59.9   4. On continuous oral anticoagulation  Z79.01     Discussion:  This is a 87 year old gentleman, atrial fibrillation on apixaban, aspirin 81 mg.  Recently admitted to the hospital for a loculated right-sided pleural effusion status postthoracentesis which reveals lymphocyte predominance, low protein, elevated LDH, normal glucose cultures negative.  Cytology negative x2 for malignancy.  Plan: We discussed all of the risk benefits and alternatives of consideration for tissue diagnosis in this gentleman. He is 87 years old on anticoagulation.  I do think cardiac clearance would be prudent before undergoing general anesthesia. We also discussed the recurrence of his right-sided effusion.  It does appear that some fluid has returned to that space.  I suspect his lung is trapped but he does feel when it is was full he was short of breath.  I think the best option for treatment of the pleural effusion would be Pleurx catheter placement.  We have improved that it is a malignant effusion but with what is going on in the chest and the way it appears on CT and pet imaging I would suggest is probably a malignant effusion.  Even if it was and were unable to approve I do not think he would tolerate having thoracoscopy by thoracic surgery.  As for the mediastinal adenopathy and the nodule we would need to consider video bronchoscopy with tissue sampling to confirm a tissue diagnosis of malignancy. I think we should approach this in a stepwise fashion with placement of a indwelling pleural catheter followed by video bronchoscopy with endobronchial ultrasound and biopsy of the station 7 node.  That and of itself could be enlarged and positive on PET scan just due to inflammation and we could be  dealing with a primary left upper lobe malignancy versus metastatic disease to the mediastinum.  We will reach out to his cardiologist and recommend evaluation before undergoing general anesthesia. We will tentatively schedule the patient for 10/17/2020.  We discussed the risk benefits and alternatives to include bleeding, pneumothorax. Patient will need to be off of Eliquis 2 full days before considering procedure. If patient's procedure is able to be scheduled on 10/17/2020 the patient will need to stop Eliquis on Saturday.  We have requested extension of anesthesia block time for 10/17/2020.    Current Outpatient Medications:  .  apixaban (ELIQUIS) 5 MG TABS tablet, Take 1 tablet (5 mg total) by mouth 2 (two) times daily., Disp: 60 tablet, Rfl: 0 .  aspirin EC 81 MG tablet, Take 81 mg by mouth daily., Disp: , Rfl:  .  fluticasone (FLONASE) 50 MCG/ACT nasal spray, Place 2 sprays into both nostrils daily., Disp: 16 g, Rfl: 6 .  furosemide (LASIX) 40 MG tablet, Take 1 tablet (40 mg total) by mouth daily., Disp: 30 tablet, Rfl: 0 .  metoprolol tartrate (LOPRESSOR) 25 MG tablet, Take 1 tablet (25 mg total) by mouth 2 (two) times daily., Disp: 60 tablet, Rfl: 0 .  Multiple Vitamin (MULTIVITAMIN WITH MINERALS) TABS tablet, Take 1 tablet by mouth daily., Disp: , Rfl:  .  simvastatin (ZOCOR) 20 MG tablet, Take 1 tablet (20 mg total) by mouth daily at 6 PM., Disp: 90 tablet, Rfl: 3 .  tamsulosin (FLOMAX) 0.4 MG CAPS capsule, Take 0.4 mg by mouth daily., Disp: , Rfl:  .  albuterol (VENTOLIN HFA) 108 (90 Base) MCG/ACT inhaler, INHALE 1 TO 2 PUFFS BY MOUTH EVERY 6 HOURS AS NEEDED  FOR WHEEZING FOR SHORTNESS OF BREATH (Patient not taking: Reported on 10/02/2020), Disp: 9 g, Rfl: 0 .  senna-docusate (SENOKOT-S) 8.6-50 MG tablet, Take 1 tablet by mouth at bedtime as needed for mild constipation or moderate constipation. (Patient not taking: Reported on 10/02/2020), Disp: 30 tablet, Rfl: 1  I spent 42 minutes  dedicated to the care of this patient on the date of this encounter to include pre-visit review of records, face-to-face time with the patient discussing conditions above, post visit ordering of testing, clinical documentation with the electronic health record, making appropriate referrals as documented, and communicating necessary findings to members of the patients care team.   Garner Nash, [facility] 10/02/2020 5:42 PM

## 2020-10-02 NOTE — Patient Instructions (Addendum)
Thank you for visiting Dr. [name] at [facility]. Today we recommend the following:  Orders Placed This Encounter  Procedures  . Procedural/ Surgical Case Request: VIDEO BRONCHOSCOPY WITH ENDOBRONCHIAL NAVIGATION, VIDEO BRONCHOSCOPY WITH ENDOBRONCHIAL ULTRASOUND, INSERTION PLEURAL DRAINAGE CATHETER  . Ambulatory referral to Pulmonology   Tentative case date on 10/17/2020 pending cardiology evaluation.  Will need to hold eliquis 2 full days prior to procedure.   Return in about 4 weeks (around 10/30/2020) for with APP or Dr. [name].    Please do your part to reduce the spread of COVID-19.

## 2020-10-05 ENCOUNTER — Encounter: Payer: Self-pay | Admitting: Internal Medicine

## 2020-10-05 ENCOUNTER — Ambulatory Visit (HOSPITAL_COMMUNITY)
Admission: RE | Admit: 2020-10-05 | Discharge: 2020-10-05 | Disposition: A | Discharge status: Home / Self Care | Payer: Medicare Other | Source: Ambulatory Visit | Attending: Nurse Practitioner | Admitting: Nurse Practitioner

## 2020-10-05 ENCOUNTER — Ambulatory Visit (INDEPENDENT_AMBULATORY_CARE_PROVIDER_SITE_OTHER): Payer: Medicare Other | Admitting: Internal Medicine

## 2020-10-05 ENCOUNTER — Other Ambulatory Visit: Payer: Self-pay

## 2020-10-05 ENCOUNTER — Ambulatory Visit (HOSPITAL_COMMUNITY): Payer: Medicare Other | Admitting: Nurse Practitioner

## 2020-10-05 VITALS — BP 134/80 | HR 93 | Ht 70.0 in | Wt 159.0 lb

## 2020-10-05 VITALS — BP 124/80 | HR 105 | Temp 98.4°F | Resp 18 | Ht 70.0 in | Wt 157.8 lb

## 2020-10-05 DIAGNOSIS — D6869 Other thrombophilia: Secondary | ICD-10-CM | POA: Diagnosis not present

## 2020-10-05 DIAGNOSIS — Z87891 Personal history of nicotine dependence: Secondary | ICD-10-CM | POA: Insufficient documentation

## 2020-10-05 DIAGNOSIS — Z79899 Other long term (current) drug therapy: Secondary | ICD-10-CM | POA: Insufficient documentation

## 2020-10-05 DIAGNOSIS — Z7901 Long term (current) use of anticoagulants: Secondary | ICD-10-CM | POA: Diagnosis not present

## 2020-10-05 DIAGNOSIS — I10 Essential (primary) hypertension: Secondary | ICD-10-CM | POA: Insufficient documentation

## 2020-10-05 DIAGNOSIS — I4811 Longstanding persistent atrial fibrillation: Secondary | ICD-10-CM | POA: Diagnosis not present

## 2020-10-05 DIAGNOSIS — E785 Hyperlipidemia, unspecified: Secondary | ICD-10-CM | POA: Insufficient documentation

## 2020-10-05 DIAGNOSIS — J90 Pleural effusion, not elsewhere classified: Secondary | ICD-10-CM

## 2020-10-05 DIAGNOSIS — Z7982 Long term (current) use of aspirin: Secondary | ICD-10-CM | POA: Diagnosis not present

## 2020-10-05 DIAGNOSIS — I4891 Unspecified atrial fibrillation: Secondary | ICD-10-CM | POA: Diagnosis not present

## 2020-10-05 DIAGNOSIS — R918 Other nonspecific abnormal finding of lung field: Secondary | ICD-10-CM

## 2020-10-05 DIAGNOSIS — E7800 Pure hypercholesterolemia, unspecified: Secondary | ICD-10-CM | POA: Diagnosis not present

## 2020-10-05 LAB — CBC
HCT: 36.4 % — ABNORMAL LOW (ref 39.0–52.0)
Hemoglobin: 11.8 g/dL — ABNORMAL LOW (ref 13.0–17.0)
MCHC: 32.5 g/dL (ref 30.0–36.0)
MCV: 80 fl (ref 78.0–100.0)
Platelets: 231 10*3/uL (ref 150.0–400.0)
RBC: 4.56 Mil/uL (ref 4.22–5.81)
RDW: 17.2 % — ABNORMAL HIGH (ref 11.5–15.5)
WBC: 5.4 10*3/uL (ref 4.0–10.5)

## 2020-10-05 LAB — COMPREHENSIVE METABOLIC PANEL
ALT: 13 U/L (ref 0–53)
AST: 12 U/L (ref 0–37)
Albumin: 3.7 g/dL (ref 3.5–5.2)
Alkaline Phosphatase: 57 U/L (ref 39–117)
BUN: 27 mg/dL — ABNORMAL HIGH (ref 6–23)
CO2: 33 mEq/L — ABNORMAL HIGH (ref 19–32)
Calcium: 9.2 mg/dL (ref 8.4–10.5)
Chloride: 101 mEq/L (ref 96–112)
Creatinine, Ser: 1.16 mg/dL (ref 0.40–1.50)
GFR: 55.91 mL/min — ABNORMAL LOW (ref >60.00)
Glucose, Bld: 102 mg/dL — ABNORMAL HIGH (ref 70–99)
Potassium: 3.4 mEq/L — ABNORMAL LOW (ref 3.5–5.1)
Sodium: 143 mEq/L (ref 135–145)
Total Bilirubin: 0.7 mg/dL (ref 0.2–1.2)
Total Protein: 6.3 g/dL (ref 6.0–8.3)

## 2020-10-05 NOTE — Progress Notes (Signed)
   Subjective:   Patient ID: Adam Briggs, male    DOB: [birth year], 87 y.o.   MRN: [id number]  HPI The patient is an 87 YO man coming in for hospital follow up (we sent him to hospital for severe right pleural effusion, s/p thoracentesis and chest tube placement, CT scan with likely malignancy left lung, right lung still slightly unclear). He went to cardiology earlier today (note not done) but states they told him he could proceed with bronchoscopy and biopsy and pleurex catheter placement. He is less SOB than before hospital. Wants to know if he can increase activity slowly. Is tired still. Weight is stable and now that he can eat without SOB he is eating better.   PMH, [facility], social history reviewed and updated  Review of Systems  Constitutional: Positive for fatigue.  HENT: Negative.   Eyes: Negative.   Respiratory: Positive for cough and shortness of breath. Negative for chest tightness.   Cardiovascular: Negative for chest pain, palpitations and leg swelling.  Gastrointestinal: Negative for abdominal distention, abdominal pain, constipation, diarrhea, nausea and vomiting.  Musculoskeletal: Negative.   Skin: Negative.   Neurological: Negative.   Psychiatric/Behavioral: Negative.     Objective:  Physical Exam Constitutional:      Appearance: He is well-developed.  HENT:     Head: Normocephalic and atraumatic.  Cardiovascular:     Rate and Rhythm: Normal rate. Rhythm irregular.  Pulmonary:     Effort: Pulmonary effort is normal. No respiratory distress.     Breath sounds: Rales present. No wheezing.  Abdominal:     General: Bowel sounds are normal. There is no distension.     Palpations: Abdomen is soft.     Tenderness: There is no abdominal tenderness. There is no rebound.  Musculoskeletal:     Cervical back: Normal range of motion.  Skin:    General: Skin is warm and dry.  Neurological:     Mental Status: He is alert and oriented to person, place, and time.      Coordination: Coordination normal.     Vitals:   10/05/20 1031  BP: 124/80  Pulse: (!) 105  Resp: 18  Temp: 98.4 F (36.9 C)  TempSrc: Oral  SpO2: 98%  Weight: 157 lb 12.8 oz (71.6 kg)  Height: 5\' 10"  (1.778 m)    This visit occurred during the SARS-CoV-2 public health emergency.  Safety protocols were in place, including screening questions prior to the visit, additional usage of staff PPE, and extensive cleaning of exam room while observing appropriate contact time as indicated for disinfecting solutions.   Assessment & Plan:

## 2020-10-05 NOTE — Progress Notes (Signed)
Primary Care Physician: [name], MD Referring Physician: [facility] f/u   Adam Briggs is a 87 y.o. male with a h/o  hypertension, dyslipidemia, aortic stenosis, who presents to the emergency department on 4/22 with complaints of dyspnea, generalized weakness. He was seen at PCP  where he was found to have pleural effusion, cardiomegaly and he was recommended to go to the emergency department.   X-ray showed large loculated right pleural effusion. CT chest confirmed loculated right pleural effusion with irregular pleural thickening suspicious for neoplastic process, moderate left pleural effusion. PCCM consulted and he underwent thoracentesis. Pleural cytology was nonconclusive, did not show any malignant cells.Hospital course remarkable for persistent new onset A. fib with RVR, cardiology were consulted.Underwent right chest tube placement by IR on 09/21/20.Repeat chest x-ray showed improvement therefore chest tube removed 09/27/2020. Follow-up chest x-ray remained stable with persistent right-sided moderate right hydropneumothorax and lung atelectasis.  Seen by pulmonary okay with discharge with outpatient follow-up. Echo showed moderate stenosis.   He was started on eliquis for a CHA2DS2VASc score of at least 4. Rate control is afib treatment strategy, with metoprolol bid. He was d/c on lasix,  with norvasc/HCTZ/losartan d/c for now.   In the afib clinic today for f/u and EKG shows afib rate controlled. He has  found v rates to be in 80's and 90's at home. He feels much improved. I watch as he walked down the hall with a walker and has a brisk walk with out any exertional dyspnea. The daughter states  that he was very active at home, with a garden plowing, moving, riding a 4 wheeler and never had any chest pain or exertional dyspnea.   He is pending bronchoscopy for further evaluation of left lung mass  and needs clearance for that as well today. He has been told to hold his DOAC  for 3 days prior.  His weight is up 6 lbs but the daughter feels he is eating better. His ankles are without any edema, abdomen is flat. He could not bend over to tie his shoes prior to hospitalization. No issues now. NO PND/orthopnea.   Today, he denies symptoms of palpitations, chest pain, shortness of breath, orthopnea, PND, lower extremity edema, dizziness, presyncope, syncope, or neurologic sequela. The patient is tolerating medications without difficulties and is otherwise without complaint today.   Past Medical History:  Diagnosis Date  . BENIGN PROSTATIC HYPERTROPHY   . Cataract   . DIVERTICULOSIS, COLON   . DM   . GERD   . Heart murmur   . Hernia   . HYPERCHOLESTEROLEMIA   . HYPERTENSION   . Moderate aortic stenosis 10/30/2011   echo 2013  . Prostate cancer [facility])    s/p seed implant   Past Surgical History:  Procedure Laterality Date  . CATARACT EXTRACTION W/ INTRAOCULAR LENS IMPLANT  02/2013   R, then L 4 weeks later - [place]  . INGUINAL HERNIA REPAIR     Right  . INGUINAL HERNIA REPAIR Left 07/15/2014   Procedure: LEFT INGUINAL HERNIA REPAIR WITH MESH;  Surgeon: Armandina Gemma, MD;  Location: [facility];  Service: General;  Laterality: Left;  . INSERTION OF MESH Left 07/15/2014   Procedure: INSERTION OF MESH;  Surgeon: Armandina Gemma, MD;  Location: [facility];  Service: General;  Laterality: Left;  . RADIOACTIVE SEED IMPLANT      Current Outpatient Medications  Medication Sig Dispense Refill  . albuterol (VENTOLIN HFA) 108 (90 Base) MCG/ACT inhaler INHALE 1 TO  2 PUFFS BY MOUTH EVERY 6 HOURS AS NEEDED FOR WHEEZING FOR SHORTNESS OF BREATH (Patient not taking: Reported on 10/02/2020) 9 g 0  . apixaban (ELIQUIS) 5 MG TABS tablet Take 1 tablet (5 mg total) by mouth 2 (two) times daily. 60 tablet 0  . aspirin EC 81 MG tablet Take 81 mg by mouth daily.    . fluticasone (FLONASE) 50 MCG/ACT nasal spray Place 2 sprays into both nostrils daily. 16 g 6  . furosemide (LASIX) 40 MG tablet Take 1  tablet (40 mg total) by mouth daily. 30 tablet 0  . metoprolol tartrate (LOPRESSOR) 25 MG tablet Take 1 tablet (25 mg total) by mouth 2 (two) times daily. 60 tablet 0  . Multiple Vitamin (MULTIVITAMIN WITH MINERALS) TABS tablet Take 1 tablet by mouth daily.    Marland Kitchen senna-docusate (SENOKOT-S) 8.6-50 MG tablet Take 1 tablet by mouth at bedtime as needed for mild constipation or moderate constipation. (Patient not taking: Reported on 10/02/2020) 30 tablet 1  . simvastatin (ZOCOR) 20 MG tablet Take 1 tablet (20 mg total) by mouth daily at 6 PM. 90 tablet 3  . tamsulosin (FLOMAX) 0.4 MG CAPS capsule Take 0.4 mg by mouth daily.     No current facility-administered medications for this encounter.    Allergies  Allergen Reactions  . Lisinopril     REACTION: Rash    Social History   Socioeconomic History  . Marital status: Widowed    Spouse name: Not on file  . Number of children: 2  . Years of education: Not on file  . Highest education level: Not on file  Occupational History  . Occupation: Retired     Comment: Retired  Immunologist  . Smoking status: Former Smoker    Packs/day: 0.50    Years: 35.00    Pack years: 17.50    Types: Cigarettes    Quit date: 10/30/1966    Years since quitting: 53.9  . Smokeless tobacco: Never Used  Substance and Sexual Activity  . Alcohol use: No  . Drug use: No  . Sexual activity: Not on file  Other Topics Concern  . Not on file  Social History Narrative   Pt says his diet is excellent   Daily caffeine    Social Determinants of Health   Financial Resource Strain: Not on file  Food Insecurity: Not on file  Transportation Needs: Not on file  Physical Activity: Not on file  Stress: Not on file  Social Connections: Not on file  Intimate Partner Violence: Not on file    Family History  Problem Relation Age of Onset  . Heart attack Paternal Uncle        in his 38s  . Atrial fibrillation Daughter   . Colon cancer Neg Hx     ROS- All systems  are reviewed and negative except as per the HPI above  Physical Exam: There were no vitals filed for this visit. Wt Readings from Last 3 Encounters:  10/02/20 71.9 kg  09/27/20 69.4 kg  09/15/20 76.4 kg    Labs: Lab Results  Component Value Date   NA 137 09/25/2020   K 4.0 09/25/2020   CL 100 09/25/2020   CO2 30 09/25/2020   GLUCOSE 108 (H) 09/25/2020   BUN 31 (H) 09/25/2020   CREATININE 1.15 09/25/2020   CALCIUM 8.8 (L) 09/25/2020   PHOS 3.7 09/16/2020   MG 2.0 09/16/2020   Lab Results  Component Value Date   INR 1.1 09/15/2020  Lab Results  Component Value Date   CHOL 117 09/19/2020   HDL 36 (L) 09/19/2020   LDLCALC 72 09/19/2020   TRIG 43 09/19/2020     GEN- The patient is well appearing, alert and oriented x 3 today.   Head- normocephalic, atraumatic Eyes-  Sclera clear, conjunctiva pink Ears- hearing intact Oropharynx- clear Neck- supple, no JVP Lymph- no cervical lymphadenopathy Lungs- Clear to ausculation bilaterally, normal work of breathing Heart- irregular rate and rhythm, no murmurs, rubs or gallops, PMI not laterally displaced GI- soft, NT, ND, + BS Extremities- no clubbing, cyanosis, or edema MS- no significant deformity or atrophy Skin- no rash or lesion Psych- euthymic mood, full affect Neuro- strength and sensation are intact  EKG- afib at 93 bpm, qrs int 92 bpm, qtc 437 ms    Assessment and Plan: 1. New onset  afib In the setting of acute hypoxic failure with new left upper lobe lung mass,  persistent  rt pneumothorax (stable)   He is improved and has rate controlled afib, which by hospital records is his goal  going forward  Continue metoprolol tartrate 25 mg bid    3. Pending bronchoscopy 5/31 Discussed with Dr. Rayann Heman, he is felt to be at high risk risk for a moderate risk   procedure, with his advanced age and current co morbidities,  but he is felt  to be medically optimized and can proceed with procedure.  Continue BB  peri  procedurally   2. CHA2DS2VASc score of 4 Continue eliquis 5 mg bid   F/u with Dr. Percival Spanish as scheduled 6/17   Geroge Baseman. Nikolos Billig, [facility] [address] [place], [place] [address] [phone]

## 2020-10-05 NOTE — Patient Instructions (Signed)
We will check the labs today. 

## 2020-10-06 ENCOUNTER — Encounter: Payer: Self-pay | Admitting: Internal Medicine

## 2020-10-06 NOTE — Assessment & Plan Note (Addendum)
Seeing cardiology and rate controlled. Still sounds irreg on exam today. Checking CBC and CMP given medication changes. On eliquis.

## 2020-10-06 NOTE — Assessment & Plan Note (Signed)
Undergoing bronchoscopy and biopsy of mediastinal LN soon with pulmonary. Plan to be determined from there.

## 2020-10-06 NOTE — Assessment & Plan Note (Signed)
Undergoing more diagnostic and therapeutic workup with pulmonary.

## 2020-10-09 ENCOUNTER — Telehealth: Payer: Self-pay | Admitting: Internal Medicine

## 2020-10-09 NOTE — Progress Notes (Signed)
  Chronic Care Management   Note  10/09/2020 Name: Adam Briggs MRN: [id number] DOB: Nov 08, 1929  Adam Briggs is a 87 y.o. year old male who is a primary care patient of [name], MD. I reached out to Bethann Goo by phone today in response to a referral sent by [name]'s PCP, [name], MD.   [name] was given information about Chronic Care Management services today including:  1. CCM service includes personalized support from designated clinical staff supervised by his physician, including individualized plan of care and coordination with other care providers 2. 24/7 contact phone numbers for assistance for urgent and routine care needs. 3. Service will only be billed when office clinical staff spend 20 minutes or more in a month to coordinate care. 4. Only one practitioner may furnish and bill the service in a calendar month. 5. The patient may stop CCM services at any time (effective at the end of the month) by phone call to the office staff.   Patient agreed to services and verbal consent obtained.   Follow up plan:   [place]

## 2020-10-12 ENCOUNTER — Other Ambulatory Visit: Payer: Self-pay

## 2020-10-12 ENCOUNTER — Encounter (HOSPITAL_COMMUNITY): Payer: Self-pay | Admitting: Pulmonary Disease

## 2020-10-12 NOTE — Progress Notes (Signed)
PCP - Dr. Sharlet Salina Cardiologist - Dr. Percival Spanish (new patient eval in June)   Chest x-ray - 09/28/20 EKG - 10/05/20 Stress Test -  ECHO - 09/20/20 Cardiac Cath -    Fasting Blood Sugar:  73 (pt no longer diabetic according to Daughter [name] - but they still occasionally check CBG at home)    Blood Thinner Instructions: per daughter, instructions given by surgeon - last dose Eliquis 10/13/20 Aspirin Instructions:   COVID TEST- 5/27  Anesthesia review: yes   -------------  SDW INSTRUCTIONS:  Your procedure is scheduled on Tuesday 5/31. Please report to [facility] Entrance "A" at 1 P.M., and check in at the Admitting office. Call this number if you have problems the morning of surgery: [phone]   Remember: Do not eat or drink after midnight the night before your surgery  Medications to take morning of surgery with a sip of water include: albuterol (VENTOLIN HFA)   fluticasone (FLONASE) metoprolol tartrate (LOPRESSOR)  As of today, STOP taking any Aspirin (unless otherwise instructed by your surgeon), Aleve, Naproxen, Ibuprofen, Motrin, Advil, Goody's, BC's, all herbal medications, fish oil, and all vitamins.    The Morning of Surgery Do not wear jewelry Do not wear lotions, powders, colognes, or deodorant Men may shave face and neck. Do not bring valuables to the hospital. [facility] is not responsible for any belongings or valuables. If you are a smoker, DO NOT Smoke 24 hours prior to surgery If you wear a CPAP at night please bring your mask the morning of surgery  Remember that you must have someone to transport you home after your surgery, and remain with you for 24 hours if you are discharged the same day. Please bring cases for contacts, glasses, hearing aids, dentures or bridgework because it cannot be worn into surgery.   Patients discharged the day of surgery will not be allowed to drive home.   Please shower the NIGHT BEFORE SURGERY and/or the MORNING OF  SURGERY (if possible antibacterial soap like dial soap). Wear comfortable clothes the morning of surgery. Oral Hygiene is also important to reduce your risk of infection.  Remember - BRUSH YOUR TEETH THE MORNING OF SURGERY WITH YOUR REGULAR TOOTHPASTE  Patient denies shortness of breath, fever, cough and chest pain.

## 2020-10-12 NOTE — Anesthesia Preprocedure Evaluation (Addendum)
Anesthesia Evaluation  Patient identified by MRN, date of birth, ID band Patient awake    Reviewed: Allergy & Precautions, H&P , NPO status , Patient's Chart, lab work & pertinent test results, reviewed documented beta blocker date and time   Airway Mallampati: I  TM Distance: >3 FB Neck ROM: full    Dental no notable dental hx. (+) Dental Advisory Given, Teeth Intact   Pulmonary neg pulmonary ROS, former smoker,    Pulmonary exam normal breath sounds clear to auscultation       Cardiovascular Exercise Tolerance: Good hypertension, Pt. on medications and Pt. on home beta blockers + dysrhythmias Atrial Fibrillation + Valvular Problems/Murmurs AS  Rhythm:Irregular Rate:Tachycardia  EKG: 10/05/20: Atrial fibrillation at 93 bpm Septal infarct , age undetermined Abnormal ECG No significant change since last tracing  ECHO 5/22 As with prior echo 09/16/20 poor quality images LV has abnormal septal  motion unable to assess RWMA;s consider TEE if clinically indicated to  also evaluate severity of AS. Left ventricular ejection fraction, by  estimation, is ? 50-55%%. The left  ventricle has mildly decreased function. Left ventricular endocardial  border not optimally defined to evaluate regional wall motion. There is  mild left ventricular hypertrophy. Left ventricular diastolic parameters  are indeterminate.  2. Right ventricular systolic function was not well visualized. The right  ventricular size is not well visualized.  3. The mitral valve was not well visualized. Trivial mitral valve  regurgitation.  4. Poorly visualized some calcium seen mean gradient 19 peak 37 mmHg  likely moderate AS.  Moderate aortic valve stenosis.  5. Pulmonic valve regurgitation poorly seen.    Neuro/Psych negative neurological ROS  negative psych ROS   GI/Hepatic Neg liver ROS, GERD  Medicated,  Endo/Other  diabetes, Oral Hypoglycemic Agents   Renal/GU negative Renal ROS  negative genitourinary   Musculoskeletal   Abdominal   Peds  Hematology negative hematology ROS (+)   Anesthesia Other Findings   Reproductive/Obstetrics negative OB ROS                         Anesthesia Physical Anesthesia Plan  ASA: III  Anesthesia Plan: General   Post-op Pain Management:    Induction: Intravenous  PONV Risk Score and Plan: 2  Airway Management Planned: Oral ETT  Additional Equipment: None  Intra-op Plan:   Post-operative Plan: Extubation in OR  Informed Consent: I have reviewed the patients History and Physical, chart, labs and discussed the procedure including the risks, benefits and alternatives for the proposed anesthesia with the patient or authorized representative who has indicated his/her understanding and acceptance.     Dental Advisory Given  Plan Discussed with: CRNA and Anesthesiologist  Anesthesia Plan Comments: (PAT note written 10/12/2020 by Myra Gianotti, PA-C. )       Anesthesia Quick Evaluation

## 2020-10-12 NOTE — Progress Notes (Signed)
Anesthesia Chart Review: SAME DAY WORK-UP   Case: [id number] Date/Time: 10/17/20 1432   Procedures:      VIDEO BRONCHOSCOPY WITH ENDOBRONCHIAL NAVIGATION (Left )     VIDEO BRONCHOSCOPY WITH ENDOBRONCHIAL ULTRASOUND (Bilateral )     INSERTION PLEURAL DRAINAGE CATHETER (Right ) - indwelling tunneled catheter   Anesthesia type: General   Diagnosis:      Lung nodule [R91.1]     Pleural effusion [J90]   Pre-op diagnosis: lung nodule, loculated effusion   Location: [facility] ROOM 2 / [place] [facility]   Surgeons: Garner Nash, DO      DISCUSSION: Patient is an 87 year old male scheduled for the above procedure.  History includes former smoker (quit 10/30/66), HTN, hypercholesterolemia, murmur/aortic stenosis (moderate AS 09/2020), prostate cancer (s/p radioactive seed implant), BPH, pre-diabetes (A1c 6.2% 09/15/20).  [facility] admission 09/15/20-09/28/20 after presenting to his PCP with progressive dyspnea and weakness, sent to the ED after CXR showed large loculated right pleural effusion. EKG also showed new onset afib. Chest CT also showed associated RLL mass-like consolidations, LUL irregular mass, moderate left pleural effusion, small low-density foci within the right liver, splenomegaly. Imaging concerning for malignancy. PCCM consulted. S/p right thoracentesis 09/15/20 showed lymphocytes, but no malignant cells. Right pleural fluid from 09/22/20 also showed no malignant cells. Cardiology consulted for new onset afib and progressive AS (moderate, previously mild in 2014). Symptomaticlly improved following thoracentesis. TSH normal. B-blocker started to improve rate control. CHA2DS2-VASc = 4. Patient had been very functional at home, so DOAC recommended "when it is clear that no other procedures." Repeat imaging showed moderate-large loculated pleural effusion, so IR placed chest tube drain 09/21/20 which was removed 09/27/20. Follow-up CXR showed persistent right-sided moderate right hydropneumothorax and lung  atelectasis, but seen by pulmonary and felt okay with discharge with outpatient follow-up. Discharge medications included apixaban 5 mg BID, ASA 81 mg daily, Lasix 40 mg daily, Lopressor 25 mg BID.    He had follow-up with pulmonologist Dr. [name] on 10/02/20. PET scan that day showed hypermetabolic LUL lung nodules most consistent with primary bronchogenic carcinoma with hypermetabolic subcarinal and lower paratracheal nodes, right lung empyema with moderate metabolic activity favored to be benign infectious or inflammatory process. Dr. [name] has recommended the above procedure in hopes to confirm a tissue diagnosis. He plans for "placement of a indwelling pleural catheter followed by video bronchoscopy with endobronchial ultrasound and biopsy of the station 7 node."  He requested cardiology preoperative clearance and was evaluated in the Afib Clinic by Roderic Palau, NP on 10/05/20.  She wrote, "Discussed with Dr. Rayann Heman, he is felt to be at high risk risk for a moderate risk   procedure, with his advanced age and current co morbidities,  but he is felt  to be medically optimized and can proceed with procedure.  Continue BB  peri procedurally".  Daughter reported instructions for him to hold Eliquis (last dose 10/13/20). He also had primary care follow-up on 10/05/20 with Dr. Sharlet Salina who is aware of surgery plans.  Preprocedure COVID-19 testing is scheduled for 10/13/20. Anesthesia team to evaluate on the day of surgery.   VS: Ht 5\' 10"  (1.778 m)   Wt 71.2 kg   BMI 22.53 kg/m   BP Readings from Last 3 Encounters:  10/05/20 134/80  10/05/20 124/80  10/02/20 120/80   Pulse Readings from Last 3 Encounters:  10/05/20 93  10/05/20 (!) 105  10/02/20 (!) 110    PROVIDERS: [name], MD is  PCP  Minus Breeding, MD will be cardiologist, scheduled to get re-established 11/03/20. (Evaluated by Fransico Him, MD during 09/18/20 admission)  Roderic Palau, NP is Afib Clinic provider.  June Leap, DO is pulmonologist   LABS: Lab results as of 10/05/20 include: Lab Results  Component Value Date   WBC 5.4 10/05/2020   HGB 11.8 (L) 10/05/2020   HCT 36.4 (L) 10/05/2020   PLT 231.0 10/05/2020   GLUCOSE 102 (H) 10/05/2020   ALT 13 10/05/2020   AST 12 10/05/2020   NA 143 10/05/2020   K 3.4 (L) 10/05/2020   CL 101 10/05/2020   CREATININE 1.16 10/05/2020   BUN 27 (H) 10/05/2020   CO2 33 (H) 10/05/2020   TSH 3.642 09/18/2020   INR 1.1 09/15/2020   HGBA1C 6.2 09/15/2020     IMAGES: PET Scan 10/02/20: IMPRESSION: 1. Hypermetabolic LEFT upper lobe pulmonary nodule most consistent with primary bronchogenic carcinoma. Nodule touches the mediastinal pleural surface. 2. Hypermetabolic subcarinal and lower paratracheal lymph nodes. Differential includes metastatic adenopathy versus reactive adenopathy related to the RIGHT lung chronic infection. 3. RIGHT lung empyema, lower lobe consolidation and diffuse atelectasis. There is moderate metabolic activity associated with these RIGHT lung findings; however, favored benign infectious or inflammatory. 4. Bilateral hypermetabolic adrenal adenomas.  CXR 09/28/20: IMPRESSION: Persistent moderate RIGHT hydropneumothorax and RIGHT lung Atelectasis. Minimal LEFT pleural effusion.   EKG: 10/05/20: Atrial fibrillation at 93 bpm Septal infarct , age undetermined Abnormal ECG No significant change since last tracing Confirmed by Shelva Majestic [phone]) on 10/05/2020 6:18:58 PM   CV: TTE 09/20/20: IMPRESSIONS  1. As with prior echo 09/16/20 poor quality images LV has abnormal septal  motion unable to assess RWMA;s consider TEE if clinically indicated to  also evaluate severity of AS. Left ventricular ejection fraction, by  estimation, is ? 50-55%%. The left  ventricle has mildly decreased function. Left ventricular endocardial  border not optimally defined to evaluate regional wall motion. There is  mild left ventricular  hypertrophy. Left ventricular diastolic parameters  are indeterminate.  2. Right ventricular systolic function was not well visualized. The right  ventricular size is not well visualized.  3. The mitral valve was not well visualized. Trivial mitral valve  regurgitation.  4. Poorly visualized some calcium seen mean gradient 19 peak 37 mmHg  likely moderate AS. Consider TEE if clinically indicated . The aortic  valve was not well visualized. Aortic valve regurgitation is not  visualized. Moderate aortic valve stenosis.  5. Pulmonic valve regurgitation poorly seen. (Comparison 09/16/20: LV/RV no visualized, moderate right pleural effusion, AV not well visualized, moderate AS with mean gradient 26.0 mmHg, AV Vmax 3.47 m/s; LVEF 60-65%, mild AS 11/05/12)   Past Medical History:  Diagnosis Date  . Atrial fibrillation ([place])   . BENIGN PROSTATIC HYPERTROPHY   . Cataract   . DIVERTICULOSIS, COLON   . DM    not diabetic-has been off metformin for about 2 years as of 09/2020 pr daughter  . GERD   . Heart murmur   . Hernia   . HYPERCHOLESTEROLEMIA   . HYPERTENSION   . Moderate aortic stenosis 10/30/2011   echo 2013  . Prostate cancer [facility])    s/p seed implant    Past Surgical History:  Procedure Laterality Date  . CATARACT EXTRACTION W/ INTRAOCULAR LENS IMPLANT  02/2013   R, then L 4 weeks later - [facility]  . INGUINAL HERNIA REPAIR     Right  . INGUINAL HERNIA REPAIR Left 07/15/2014  Procedure: LEFT INGUINAL HERNIA REPAIR WITH MESH;  Surgeon: Armandina Gemma, MD;  Location: [facility];  Service: General;  Laterality: Left;  . INSERTION OF MESH Left 07/15/2014   Procedure: INSERTION OF MESH;  Surgeon: Armandina Gemma, MD;  Location: [facility];  Service: General;  Laterality: Left;  . RADIOACTIVE SEED IMPLANT      MEDICATIONS: No current facility-administered medications for this encounter.   Marland Kitchen apixaban (ELIQUIS) 5 MG TABS tablet  . fluticasone (FLONASE) 50 MCG/ACT nasal spray  . furosemide  (LASIX) 40 MG tablet  . metoprolol tartrate (LOPRESSOR) 25 MG tablet  . Multiple Vitamin (MULTIVITAMIN WITH MINERALS) TABS tablet  . senna-docusate (SENOKOT-S) 8.6-50 MG tablet  . simvastatin (ZOCOR) 20 MG tablet  . tamsulosin (FLOMAX) 0.4 MG CAPS capsule  . albuterol (VENTOLIN HFA) 108 (90 Base) MCG/ACT inhaler    Myra Gianotti, PA-C Surgical Short Stay/Anesthesiology [facility] [address] Phone [phone] [facility] Phone [phone] 10/12/2020 1:37 PM

## 2020-10-13 ENCOUNTER — Other Ambulatory Visit (HOSPITAL_COMMUNITY)
Admission: RE | Admit: 2020-10-13 | Discharge: 2020-10-13 | Disposition: A | Discharge status: Home / Self Care | Payer: Medicare Other | Source: Ambulatory Visit | Attending: Pulmonary Disease | Admitting: Pulmonary Disease

## 2020-10-13 DIAGNOSIS — Z01812 Encounter for preprocedural laboratory examination: Secondary | ICD-10-CM | POA: Diagnosis not present

## 2020-10-13 DIAGNOSIS — Z20822 Contact with and (suspected) exposure to covid-19: Secondary | ICD-10-CM | POA: Diagnosis not present

## 2020-10-13 LAB — SARS CORONAVIRUS 2 (TAT 6-24 HRS): SARS Coronavirus 2: NEGATIVE

## 2020-10-17 ENCOUNTER — Ambulatory Visit (HOSPITAL_COMMUNITY): Payer: Medicare Other

## 2020-10-17 ENCOUNTER — Telehealth: Payer: Self-pay | Admitting: Pulmonary Disease

## 2020-10-17 ENCOUNTER — Encounter (HOSPITAL_COMMUNITY): Payer: Self-pay | Admitting: Pulmonary Disease

## 2020-10-17 ENCOUNTER — Ambulatory Visit (HOSPITAL_COMMUNITY)
Admission: RE | Admit: 2020-10-17 | Discharge: 2020-10-17 | Disposition: A | Discharge status: Home / Self Care | Payer: Medicare Other | Attending: Pulmonary Disease | Admitting: Pulmonary Disease

## 2020-10-17 ENCOUNTER — Encounter (HOSPITAL_COMMUNITY)
Admission: RE | Disposition: A | Discharge status: Home / Self Care | Payer: Self-pay | Source: Home / Self Care | Attending: Pulmonary Disease

## 2020-10-17 ENCOUNTER — Ambulatory Visit (HOSPITAL_COMMUNITY): Payer: Medicare Other | Admitting: Vascular Surgery

## 2020-10-17 ENCOUNTER — Other Ambulatory Visit: Payer: Self-pay

## 2020-10-17 DIAGNOSIS — Z9889 Other specified postprocedural states: Secondary | ICD-10-CM

## 2020-10-17 DIAGNOSIS — E7800 Pure hypercholesterolemia, unspecified: Secondary | ICD-10-CM | POA: Diagnosis not present

## 2020-10-17 DIAGNOSIS — Z7901 Long term (current) use of anticoagulants: Secondary | ICD-10-CM | POA: Diagnosis not present

## 2020-10-17 DIAGNOSIS — R599 Enlarged lymph nodes, unspecified: Secondary | ICD-10-CM | POA: Diagnosis not present

## 2020-10-17 DIAGNOSIS — I4891 Unspecified atrial fibrillation: Secondary | ICD-10-CM | POA: Diagnosis not present

## 2020-10-17 DIAGNOSIS — I350 Nonrheumatic aortic (valve) stenosis: Secondary | ICD-10-CM | POA: Insufficient documentation

## 2020-10-17 DIAGNOSIS — R918 Other nonspecific abnormal finding of lung field: Secondary | ICD-10-CM | POA: Diagnosis present

## 2020-10-17 DIAGNOSIS — Z8546 Personal history of malignant neoplasm of prostate: Secondary | ICD-10-CM | POA: Insufficient documentation

## 2020-10-17 DIAGNOSIS — Z87891 Personal history of nicotine dependence: Secondary | ICD-10-CM | POA: Diagnosis not present

## 2020-10-17 DIAGNOSIS — Z419 Encounter for procedure for purposes other than remedying health state, unspecified: Secondary | ICD-10-CM

## 2020-10-17 DIAGNOSIS — R911 Solitary pulmonary nodule: Secondary | ICD-10-CM

## 2020-10-17 DIAGNOSIS — J90 Pleural effusion, not elsewhere classified: Secondary | ICD-10-CM | POA: Diagnosis not present

## 2020-10-17 DIAGNOSIS — Z7982 Long term (current) use of aspirin: Secondary | ICD-10-CM | POA: Insufficient documentation

## 2020-10-17 DIAGNOSIS — E119 Type 2 diabetes mellitus without complications: Secondary | ICD-10-CM | POA: Insufficient documentation

## 2020-10-17 DIAGNOSIS — I10 Essential (primary) hypertension: Secondary | ICD-10-CM | POA: Diagnosis not present

## 2020-10-17 DIAGNOSIS — R846 Abnormal cytological findings in specimens from respiratory organs and thorax: Secondary | ICD-10-CM | POA: Diagnosis not present

## 2020-10-17 DIAGNOSIS — Z79899 Other long term (current) drug therapy: Secondary | ICD-10-CM | POA: Diagnosis not present

## 2020-10-17 HISTORY — PX: BRONCHIAL BRUSHINGS: SHX5108

## 2020-10-17 HISTORY — PX: FIDUCIAL MARKER PLACEMENT: SHX6858

## 2020-10-17 HISTORY — PX: VIDEO BRONCHOSCOPY WITH ENDOBRONCHIAL NAVIGATION: SHX6175

## 2020-10-17 HISTORY — PX: BRONCHIAL NEEDLE ASPIRATION BIOPSY: SHX5106

## 2020-10-17 HISTORY — PX: VIDEO BRONCHOSCOPY WITH ENDOBRONCHIAL ULTRASOUND: SHX6177

## 2020-10-17 HISTORY — PX: BRONCHIAL WASHINGS: SHX5105

## 2020-10-17 HISTORY — DX: Unspecified atrial fibrillation: I48.91

## 2020-10-17 HISTORY — PX: BRONCHIAL BIOPSY: SHX5109

## 2020-10-17 LAB — GLUCOSE, CAPILLARY: Glucose-Capillary: 103 mg/dL — ABNORMAL HIGH (ref 70–99)

## 2020-10-17 SURGERY — VIDEO BRONCHOSCOPY WITH ENDOBRONCHIAL NAVIGATION
Anesthesia: General | Laterality: Left

## 2020-10-17 MED ORDER — ONDANSETRON HCL 4 MG/2ML IJ SOLN
INTRAMUSCULAR | Status: DC | PRN
  Administered 2020-10-17: 4 mg via INTRAVENOUS

## 2020-10-17 MED ORDER — CHLORHEXIDINE GLUCONATE 0.12 % MT SOLN: 15.0000 mL | Freq: Once | OROMUCOSAL | Status: AC

## 2020-10-17 MED ORDER — SUGAMMADEX SODIUM 200 MG/2ML IV SOLN
INTRAVENOUS | Status: DC | PRN
  Administered 2020-10-17: 150 mg via INTRAVENOUS

## 2020-10-17 MED ORDER — CHLORHEXIDINE GLUCONATE 0.12 % MT SOLN
OROMUCOSAL | Status: AC
  Administered 2020-10-17: 15 mL via OROMUCOSAL
  Filled 2020-10-17: qty 15

## 2020-10-17 MED ORDER — LACTATED RINGERS IV SOLN: INTRAVENOUS | Status: DC

## 2020-10-17 MED ORDER — DEXAMETHASONE SODIUM PHOSPHATE 10 MG/ML IJ SOLN
INTRAMUSCULAR | Status: DC | PRN
  Administered 2020-10-17: 4 mg via INTRAVENOUS

## 2020-10-17 MED ORDER — PROPOFOL 10 MG/ML IV BOLUS
INTRAVENOUS | Status: DC | PRN
  Administered 2020-10-17: 100 mg via INTRAVENOUS
  Administered 2020-10-17: 20 mg via INTRAVENOUS

## 2020-10-17 MED ORDER — ROCURONIUM BROMIDE 10 MG/ML (PF) SYRINGE
PREFILLED_SYRINGE | INTRAVENOUS | Status: DC | PRN
  Administered 2020-10-17: 60 mg via INTRAVENOUS
  Administered 2020-10-17: 5 mg via INTRAVENOUS

## 2020-10-17 MED ORDER — LIDOCAINE 2% (20 MG/ML) 5 ML SYRINGE
INTRAMUSCULAR | Status: DC | PRN
  Administered 2020-10-17: 60 mg via INTRAVENOUS

## 2020-10-17 MED ORDER — PHENYLEPHRINE HCL-NACL 10-0.9 MG/250ML-% IV SOLN
INTRAVENOUS | Status: DC | PRN
  Administered 2020-10-17: 50 ug/min via INTRAVENOUS

## 2020-10-17 SURGICAL SUPPLY — 54 items
ADAPTER BRONCH F/PENTAX (ADAPTER) ×5 IMPLANT
ADAPTER VALVE BIOPSY EBUS (MISCELLANEOUS) IMPLANT
ADPR BSCP EDG PNTX (ADAPTER) ×4
ADPTR VALVE BIOPSY EBUS (MISCELLANEOUS)
BRUSH CYTOL CELLEBRITY 1.5X140 (MISCELLANEOUS) ×5 IMPLANT
BRUSH SUPERTRAX BIOPSY (INSTRUMENTS) IMPLANT
BRUSH SUPERTRAX NDL-TIP CYTO (INSTRUMENTS) ×5 IMPLANT
CANISTER SUCT 3000ML PPV (MISCELLANEOUS) ×5 IMPLANT
CHANNEL WORK EXTEND EDGE 180 (KITS) IMPLANT
CHANNEL WORK EXTEND EDGE 45 (KITS) IMPLANT
CHANNEL WORK EXTEND EDGE 90 (KITS) IMPLANT
CONT SPEC 4OZ CLIKSEAL STRL BL (MISCELLANEOUS) ×5 IMPLANT
COVER BACK TABLE 60X90IN (DRAPES) ×5 IMPLANT
COVER DOME SNAP 22 D (MISCELLANEOUS) ×5 IMPLANT
FILTER STRAW FLUID ASPIR (MISCELLANEOUS) IMPLANT
FORCEPS BIOP RJ4 1.8 (CUTTING FORCEPS) IMPLANT
FORCEPS BIOP SUPERTRX PREMAR (INSTRUMENTS) ×5 IMPLANT
GAUZE SPONGE 4X4 12PLY STRL (GAUZE/BANDAGES/DRESSINGS) ×5 IMPLANT
GLOVE BIO SURGEON STRL SZ7.5 (GLOVE) ×5 IMPLANT
GLOVE SURG SS PI 7.5 STRL IVOR (GLOVE) ×10 IMPLANT
GOWN STRL REUS W/ TWL LRG LVL3 (GOWN DISPOSABLE) ×8 IMPLANT
GOWN STRL REUS W/TWL LRG LVL3 (GOWN DISPOSABLE) ×10
KIT CLEAN ENDO COMPLIANCE (KITS) ×10 IMPLANT
KIT LOCATABLE GUIDE (CANNULA) IMPLANT
KIT MARKER FIDUCIAL DELIVERY (KITS) IMPLANT
KIT PROCEDURE EDGE 180 (KITS) IMPLANT
KIT PROCEDURE EDGE 45 (KITS) IMPLANT
KIT PROCEDURE EDGE 90 (KITS) IMPLANT
KIT TURNOVER KIT B (KITS) ×5 IMPLANT
MARKER SKIN DUAL TIP RULER LAB (MISCELLANEOUS) ×5 IMPLANT
NDL EBUS SONO TIP PENTAX (NEEDLE) ×3 IMPLANT
NDL SUPERTRX PREMARK BIOPSY (NEEDLE) ×3 IMPLANT
NEEDLE EBUS SONO TIP PENTAX (NEEDLE) ×5 IMPLANT
NEEDLE SUPERTRX PREMARK BIOPSY (NEEDLE) ×5 IMPLANT
NS IRRIG 1000ML POUR BTL (IV SOLUTION) ×5 IMPLANT
OIL SILICONE PENTAX (PARTS (SERVICE/REPAIRS)) ×5 IMPLANT
PAD ARMBOARD 7.5X6 YLW CONV (MISCELLANEOUS) ×10 IMPLANT
PATCHES PATIENT (LABEL) ×15 IMPLANT
SOL ANTI FOG 6CC (MISCELLANEOUS) ×4 IMPLANT
SOLUTION ANTI FOG 6CC (MISCELLANEOUS) ×1
SYR 20CC LL (SYRINGE) ×10 IMPLANT
SYR 20ML ECCENTRIC (SYRINGE) ×10 IMPLANT
SYR 50ML SLIP (SYRINGE) ×5 IMPLANT
SYR 5ML LUER SLIP (SYRINGE) ×5 IMPLANT
TOWEL OR 17X24 6PK STRL BLUE (TOWEL DISPOSABLE) ×5 IMPLANT
TRAP SPECIMEN MUCOUS 40CC (MISCELLANEOUS) IMPLANT
TUBE CONNECTING 20X1/4 (TUBING) ×10 IMPLANT
UNDERPAD 30X30 (UNDERPADS AND DIAPERS) ×5 IMPLANT
VALVE BIOPSY  SINGLE USE (MISCELLANEOUS) ×5
VALVE BIOPSY SINGLE USE (MISCELLANEOUS) ×4 IMPLANT
VALVE DISPOSABLE (MISCELLANEOUS) ×5 IMPLANT
VALVE SUCTION BRONCHIO DISP (MISCELLANEOUS) ×5 IMPLANT
WATER STERILE IRR 1000ML POUR (IV SOLUTION) ×5 IMPLANT
fiducial marker ×2 IMPLANT

## 2020-10-17 NOTE — Discharge Instructions (Signed)
Flexible Bronchoscopy, Care After This sheet gives you information about how to care for yourself after your test. Your doctor may also give you more specific instructions. If you have problems or questions, contact your doctor. Follow these instructions at home: Eating and drinking  Do not eat or drink anything (not even water) for 2 hours after your test, or until your numbing medicine (local anesthetic) wears off.  When your numbness is gone and your cough and gag reflexes have come back, you may: ? Eat only soft foods. ? Slowly drink liquids.  The day after the test, go back to your normal diet. Driving  Do not drive for 24 hours if you were given a medicine to help you relax (sedative).  Do not drive or use heavy machinery while taking prescription pain medicine. General instructions   Take over-the-counter and prescription medicines only as told by your doctor.  Return to your normal activities as told. Ask what activities are safe for you.  Do not use any products that have nicotine or tobacco in them. This includes cigarettes and e-cigarettes. If you need help quitting, ask your doctor.  Keep all follow-up visits as told by your doctor. This is important. It is very important if you had a tissue sample (biopsy) taken. Get help right away if:  You have shortness of breath that gets worse.  You get light-headed.  You feel like you are going to pass out (faint).  You have chest pain.  You cough up: ? More than a little blood. ? More blood than before. Summary  Do not eat or drink anything (not even water) for 2 hours after your test, or until your numbing medicine wears off.  Do not use cigarettes. Do not use e-cigarettes.  Get help right away if you have chest pain.   This information is not intended to replace advice given to you by your health care provider. Make sure you discuss any questions you have with your health care provider. Document Released:  03/03/2009 Document Revised: 04/18/2017 Document Reviewed: 05/24/2016 Elsevier Patient Education  2020 [employer].

## 2020-10-17 NOTE — Progress Notes (Signed)
Spoke with Dr. [name] via phone regarding pt's O2 sats. Per Dr. [name], pt may be discharged with maintained O2 saturation above 88% on room air. Pt maintaining saturations of 88-90% on room air. Pt to be discharged per Dr. Juline Patch order and endoscopy discharge criteria.

## 2020-10-17 NOTE — Interval H&P Note (Signed)
History and Physical Interval Note:  10/17/2020 2:45 PM  Adam Briggs  has presented today for surgery, with the diagnosis of lung nodule, loculated effusion.  The various methods of treatment have been discussed with the patient and family. After consideration of risks, benefits and other options for treatment, the patient has consented to  Procedure(s) with comments: [place] (Left) VIDEO BRONCHOSCOPY WITH ENDOBRONCHIAL ULTRASOUND (Bilateral) INSERTION PLEURAL DRAINAGE CATHETER (Right) - indwelling tunneled catheter as a surgical intervention.  The patient's history has been reviewed, patient examined, no change in status, stable for surgery.  I have reviewed the patient's chart and labs.  Questions were answered to the patient's satisfaction.     [place]

## 2020-10-17 NOTE — Anesthesia Procedure Notes (Signed)
Procedure Name: Intubation Date/Time: 10/17/2020 3:04 PM Performed by: Jenne Campus, CRNA Pre-anesthesia Checklist: Patient identified, Emergency Drugs available, Suction available and Patient being monitored Patient Re-evaluated:Patient Re-evaluated prior to induction Oxygen Delivery Method: Circle System Utilized Preoxygenation: Pre-oxygenation with 100% oxygen Induction Type: IV induction Ventilation: Mask ventilation without difficulty Laryngoscope Size: Miller and 3 Grade View: Grade I Tube type: Oral Tube size: 8.5 mm Number of attempts: 1 Airway Equipment and Method: Stylet and Oral airway Placement Confirmation: ETT inserted through vocal cords under direct vision,  positive ETCO2 and breath sounds checked- equal and bilateral Secured at: 23 cm Tube secured with: Tape Dental Injury: Teeth and Oropharynx as per pre-operative assessment

## 2020-10-17 NOTE — Transfer of Care (Signed)
Immediate Anesthesia Transfer of Care Note  Patient: Adam Briggs  Procedure(s) Performed: VIDEO BRONCHOSCOPY WITH ENDOBRONCHIAL NAVIGATION (Left ) VIDEO BRONCHOSCOPY WITH ENDOBRONCHIAL ULTRASOUND (Bilateral ) BRONCHIAL BIOPSIES BRONCHIAL BRUSHINGS BRONCHIAL NEEDLE ASPIRATION BIOPSIES BRONCHIAL WASHINGS FIDUCIAL MARKER PLACEMENT  Patient Location: Endoscopy Unit  Anesthesia Type:General  Level of Consciousness: oriented, drowsy and patient cooperative  Airway & Oxygen Therapy: Patient Spontanous Breathing and Patient connected to face mask oxygen  Post-op Assessment: Report given to RN and Post -op Vital signs reviewed and stable  Post vital signs: Reviewed  Last Vitals:  Vitals Value Taken Time  BP    Temp    Pulse    Resp    SpO2      Last Pain:  Vitals:   10/17/20 1257  TempSrc:   PainSc: 0-No pain      Patients Stated Pain Goal: 4 (09/98/33 8250)  Complications: No complications documented.

## 2020-10-17 NOTE — Op Note (Signed)
PCCM:  We planned to place an indwelling pleural catheter. However, the pleural space appeared to be well organized with thickened fluid and loculations. Therefore, the IPC was not placed. I called and discussed this with the patients daughter.   Real-time chest ultrasound was used to visualize the right pleural space.    There was small amount of pleural fluid visible and predominant large amounts of fibrin and organized material lining the area just above the diaphragm.  Garner Nash, DO [facility] 10/17/2020 4:58 PM

## 2020-10-17 NOTE — Op Note (Signed)
Video Bronchoscopy with Electromagnetic Navigation Procedure Note Video Bronchoscopy with Endobronchial Ultrasound Procedure Note Video bronchoscopy with fiducial placement procedure note  Date of Operation: 10/17/2020  Pre-op Diagnosis: Left upper lobe pulmonary nodule, adenopathy  Post-op Diagnosis: Left upper lobe pulmonary nodule, adenopathy  Surgeon: Garner Nash, DO  Assistants: None   Anesthesia: General endotracheal anesthesia  Operation: Flexible video fiberoptic bronchoscopy with electromagnetic navigation and biopsies.  Estimated Blood Loss: Minimal  Complications: None   Indications and History: Adam Briggs is a 87 y.o. male with left upper lobe pulmonary nodule, adenopathy.  The risks, benefits, complications, treatment options and expected outcomes were discussed with the patient.  The possibilities of pneumothorax, pneumonia, reaction to medication, pulmonary aspiration, perforation of a viscus, bleeding, failure to diagnose a condition and creating a complication requiring transfusion or operation were discussed with the patient who freely signed the consent.    Description of Procedure: The patient was seen in the Preoperative Area, was examined and was deemed appropriate to proceed.  The patient was taken to [facility] endoscopy room 2, identified as Adam Briggs and the procedure verified as Flexible Video Fiberoptic Bronchoscopy.  A Time Out was held and the above information confirmed.   Prior to the date of the procedure a high-resolution CT scan of the chest was performed. Utilizing Ulysses a virtual tracheobronchial tree was generated to allow the creation of distinct navigation pathways to the patient's parenchymal abnormalities. After being taken to the operating room general anesthesia was initiated and the patient  was orally intubated. The video fiberoptic bronchoscope was introduced via the endotracheal tube and a general inspection was performed  which showed normal right and left lung anatomy no evidence of endobronchial lesion.   Target #1 endobronchial ultrasound station 7: The standard scope was then withdrawn and the endobronchial ultrasound was used to identify and characterize the peritracheal, hilar and bronchial lymph nodes. Inspection showed enlarged subcarinal node as seen on PET scan. Using real-time ultrasound guidance Wang needle biopsies were take from Station 7 nodes and were sent for cytology. The patient tolerated the procedure well without apparent complications. There was no significant blood loss. The bronchoscope was withdrawn.   Target #2 left upper lobe nodule: The extendable working channel and locator guide were introduced into the bronchoscope. The distinct navigation pathways prepared prior to this procedure were then utilized to navigate to within 1.1 cm of patient's lesion(s) identified on CT scan.  Full fluoroscopic suite was completed from RAO 25 degrees to LAO 25 degrees with inspiratory breath-hold APL at 20 cm water. The extendable working channel was secured into place and the locator guide was withdrawn. Under fluoroscopic guidance transbronchial needle brushings, transbronchial Wang needle biopsies, and transbronchial forceps biopsies were performed to be sent for cytology and pathology.  Following tissue sampling a single fiducial was placed at approximately 2.5 cm from lesional center lateral to the nodule.  Fiducial was placed using a fiducial catheter and wire delivery kit under direct fluoroscopy.  A bronchioalveolar lavage was performed in the left upper lobe and sent for cytology and microbiology (bacterial, fungal, AFB smears and cultures).   Standard therapeutic bronchoscope was inserted into the patient's airway and aspiration of the bilateral mainstem's was utilized for clearance and removal of any remaining blood clots and secretions.  All distal subsegments were patent at the termination of the  procedure. At the end of the procedure a general airway inspection was performed and there was no evidence of active bleeding.  The bronchoscope was removed.  The patient tolerated the procedure well. There was no significant blood loss and there were no obvious complications. A post-procedural chest x-ray is pending.  Samples Target #1: 1. Wang needle biopsies from station 7 node  Samples Target #2: 1. Transbronchial needle brushings from left upper lobe 2. Transbronchial Wang needle biopsies from left upper lobe 3. Transbronchial forceps biopsies from left upper lobe 4. Bronchoalveolar lavage from left upper lobe   Plans:  The patient will be discharged from the PACU to home when recovered from anesthesia and after chest x-ray is reviewed. We will review the cytology, pathology and microbiology results with the patient when they become available. Outpatient followup will be with Garner Nash, DO.   Garner Nash, DO [facility] 10/17/2020 4:54 PM

## 2020-10-17 NOTE — Telephone Encounter (Signed)
Called by patients daughter, [name], that her dad had a bronchoscopic lung biopsy earlier today and now has sats on room air = 85-89%. He usually has sats = 97% on room air. Difficult situation. DDx includes pneumothorax vs bleeding in airway with resulting atelectasis from clot in airway. Impossible to diagnose over the phone. He is really not physically able to ambulate without significant help at baseline. Concern for further oxygen desaturation with ambulation. Recommended that she call EMS and have him taken to the emergency department for further evaluation and possible hospital admission.

## 2020-10-18 ENCOUNTER — Encounter (HOSPITAL_COMMUNITY): Payer: Self-pay | Admitting: Pulmonary Disease

## 2020-10-18 LAB — CYTOLOGY - NON PAP

## 2020-10-18 LAB — FUNGUS CULTURE WITH STAIN

## 2020-10-18 LAB — ACID FAST SMEAR (AFB, MYCOBACTERIA): Acid Fast Smear: NEGATIVE

## 2020-10-18 LAB — FUNGAL ORGANISM REFLEX

## 2020-10-18 LAB — FUNGUS CULTURE RESULT

## 2020-10-18 NOTE — Telephone Encounter (Signed)
Thanks.  Triage, can we call and check on him today?  Garner Nash, DO [facility] 10/18/2020 7:35 AM

## 2020-10-18 NOTE — Telephone Encounter (Signed)
ATC VM is full unable to leave message.

## 2020-10-18 NOTE — Telephone Encounter (Signed)
Yes, ok for eliquis to restart  Garner Nash, DO [facility] 10/18/2020 1:42 PM

## 2020-10-18 NOTE — Telephone Encounter (Addendum)
Called and spoke with pt's daughter, [name], [place] per DPR  She states that she got up every 2 hours during the night to check pt's sats  They increased from 89% ra at 2 am to 97% ra as of 6 am today  He reported to [place] that he feels good today, much stronger  He did cough up some blood x 2 yesterday afternoon, about the size of a fingernail, none as of today  She wants to know if he is okay to start back on his Eliquis today  Please advise, thanks!

## 2020-10-18 NOTE — Anesthesia Postprocedure Evaluation (Signed)
Anesthesia Post Note  Patient: Adam Briggs  Procedure(s) Performed: VIDEO BRONCHOSCOPY WITH ENDOBRONCHIAL NAVIGATION (Left ) VIDEO BRONCHOSCOPY WITH ENDOBRONCHIAL ULTRASOUND (Bilateral ) BRONCHIAL BIOPSIES BRONCHIAL BRUSHINGS BRONCHIAL NEEDLE ASPIRATION BIOPSIES BRONCHIAL WASHINGS FIDUCIAL MARKER PLACEMENT     Patient location during evaluation: PACU Anesthesia Type: General Level of consciousness: awake and alert Pain management: pain level controlled Vital Signs Assessment: post-procedure vital signs reviewed and stable Respiratory status: spontaneous breathing, nonlabored ventilation, respiratory function stable and patient connected to nasal cannula oxygen Cardiovascular status: blood pressure returned to baseline and stable Postop Assessment: no apparent nausea or vomiting Anesthetic complications: no   No complications documented.  Last Vitals:  Vitals:   10/17/20 1732 10/17/20 1740  BP: 127/70 (!) 141/92  Pulse: (!) 104 (!) 120  Resp: (!) 26 (!) 25  Temp:    SpO2: 97% 93%    Last Pain:  Vitals:   10/17/20 1740  TempSrc:   PainSc: 0-No pain                 Gianne Shugars

## 2020-10-19 NOTE — Telephone Encounter (Signed)
Called Adam Briggs again, no answer and no VM  I called pt at his number and spoke with him and let him know to go ahead and restart the eliquis Pt verbalized understanding and nothing further needed

## 2020-10-20 LAB — CULTURE, BAL-QUANTITATIVE W GRAM STAIN
Culture: NO GROWTH
Gram Stain: NONE SEEN

## 2020-10-24 NOTE — Progress Notes (Signed)
Chronic Care Management Pharmacy Note  10/25/2020 Name:  Adam Briggs MRN:  [id number] DOB:  29-Sep-1929  Subjective: Adam Briggs is an 87 y.o. year old male who is a primary patient of [name], MD.  The CCM team was consulted for assistance with disease management and care coordination needs.    Engaged with patient by telephone for initial visit in response to provider referral for pharmacy case management and/or care coordination services.   Consent to Services:  The patient was given the following information about Chronic Care Management services today, agreed to services, and gave verbal consent: 1. CCM service includes personalized support from designated clinical staff supervised by the primary care provider, including individualized plan of care and coordination with other care providers 2. 24/7 contact phone numbers for assistance for urgent and routine care needs. 3. Service will only be billed when office clinical staff spend 20 minutes or more in a month to coordinate care. 4. Only one practitioner may furnish and bill the service in a calendar month. 5.The patient may stop CCM services at any time (effective at the end of the month) by phone call to the office staff. 6. The patient will be responsible for cost sharing (co-pay) of up to 20% of the service fee (after annual deductible is met). Patient agreed to services and consent obtained.  Patient Care Team: [name], MD as PCP - General (Internal Medicine) Renato Shin, MD (Endocrinology) Irene Shipper, MD (Gastroenterology) Carolan Clines, MD (Inactive) (Urology) Minus Breeding, MD (Cardiology) Delice Bison Darnelle Maffucci, [facility] (Pharmacist) Tomasa Blase, [facility] as Pharmacist (Pharmacist)  Recent office visits: 10/05/2020 - PCP  - hospital follow up - pleural effusion, s/p thoracentesis and chest tube placement, CT scan with likely malignancy left lung, right lung still slightly unclear - no changes to  medications  09/15/2020 - PCP - worsening / severe SOB - advised to proceed to ER  04/24/2020 - PCP televideo visit for SOB/ trouble breathing - onset 1-2 days prior - prescribed albuterol and prednisone  Recent consult visits: 10/17/2020 - Bronchoscopy completed  10/05/2020 - Laroy Apple NP - Cardiology - new onset afib - continue eliquis and metoprolol  10/02/2020 - Dr. [name] - Pulmonology - f/u from hospital discharge - pleural effusion - PET scan done - revealed a 21 mm left upper lobe hypermetabolic lung mass with an SUV max of 11.5 concerning for primary bronchogenic carcinoma 04/27/2020 - Urology   Hospital visits: 09/15/20 - 09/28/20 - To ED for SOB, Afib, pleural effusion, and hypoxia   Objective:  Lab Results  Component Value Date   CREATININE 1.16 10/05/2020   BUN 27 (H) 10/05/2020   GFR 55.91 (L) 10/05/2020   GFRNONAA >60 09/25/2020   GFRAA 68 (L) 07/11/2014   NA 143 10/05/2020   K 3.4 (L) 10/05/2020   CALCIUM 9.2 10/05/2020   CO2 33 (H) 10/05/2020   GLUCOSE 102 (H) 10/05/2020    Lab Results  Component Value Date/Time   HGBA1C 6.2 09/15/2020 11:32 AM   HGBA1C 6.0 (H) 11/26/2019 03:49 PM   GFR 55.91 (L) 10/05/2020 10:51 AM   GFR 68.47 09/15/2020 11:32 AM   MICROALBUR 20.0 (H) 10/19/2013 10:59 AM   MICROALBUR 13.8 (H) 10/14/2012 10:28 AM    Last diabetic Eye exam:  Lab Results  Component Value Date/Time   HMDIABEYEEXA done - dr Christel Mormon - no DM retinopathy 02/17/2013 12:00 AM    Last diabetic Foot exam:  No results found for: HMDIABFOOTEX  Lab Results  Component Value Date   CHOL 117 09/19/2020   HDL 36 (L) 09/19/2020   LDLCALC 72 09/19/2020   LDLDIRECT 110.2 12/28/2007   TRIG 43 09/19/2020   CHOLHDL 3.3 09/19/2020    Hepatic Function Latest Ref Rng & Units 10/05/2020 09/16/2020 09/15/2020  Total Protein 6.0 - 8.3 g/dL 6.3 6.1(L) 6.6  Albumin 3.5 - 5.2 g/dL 3.7 3.3(L) 3.6  AST 0 - 37 U/L 12 16 -  ALT 0 - 53 U/L 13 13 -  Alk Phosphatase 39 - 117 U/L 57 55 -   Total Bilirubin 0.2 - 1.2 mg/dL 0.7 0.5 -  Bilirubin, Direct 0.0 - 0.3 mg/dL - - -    Lab Results  Component Value Date/Time   TSH 3.642 09/18/2020 05:20 PM   TSH 2.85 11/19/2018 09:22 AM   TSH 2.85 10/14/2012 10:28 AM    CBC Latest Ref Rng & Units 10/05/2020 09/25/2020 09/22/2020  WBC 4.0 - 10.5 K/uL 5.4 5.6 5.6  Hemoglobin 13.0 - 17.0 g/dL 11.8(L) 11.6(L) 11.7(L)  Hematocrit 39.0 - 52.0 % 36.4(L) 37.5(L) 38.2(L)  Platelets 150.0 - 400.0 K/uL 231.0 229 156    No results found for: VD25OH  Clinical ASCVD: No  The ASCVD Risk score Mikey Bussing DC Jr., et al., 2013) failed to calculate for the following reasons:   The 2013 ASCVD risk score is only valid for ages 33 to 29    Depression screen PHQ 2/9 11/26/2019 11/19/2018 02/10/2017  Decreased Interest 0 0 0  Down, Depressed, Hopeless 0 0 0  PHQ - 2 Score 0 0 0     Social History   Tobacco Use  Smoking Status Former Smoker  . Packs/day: 0.50  . Years: 35.00  . Pack years: 17.50  . Types: Cigarettes  . Quit date: 10/30/1966  . Years since quitting: 54.0  Smokeless Tobacco Never Used   BP Readings from Last 3 Encounters:  10/17/20 (!) 141/92  10/05/20 134/80  10/05/20 124/80   Pulse Readings from Last 3 Encounters:  10/17/20 (!) 120  10/05/20 93  10/05/20 (!) 105   Wt Readings from Last 3 Encounters:  10/17/20 156 lb 15.5 oz (71.2 kg)  10/05/20 159 lb (72.1 kg)  10/05/20 157 lb 12.8 oz (71.6 kg)   BMI Readings from Last 3 Encounters:  10/17/20 22.52 kg/m  10/05/20 22.81 kg/m  10/05/20 22.64 kg/m    Assessment/Interventions: Review of patient past medical history, allergies, medications, health status, including review of consultants reports, laboratory and other test data, was performed as part of comprehensive evaluation and provision of chronic care management services.   SDOH:  (Social Determinants of Health) assessments and interventions performed: Yes  SDOH Screenings   Alcohol Screen: Not on file  Depression  (PHQ2-9): Low Risk   . PHQ-2 Score: 0  Financial Resource Strain: Not on file  Food Insecurity: Not on file  Housing: Not on file  Physical Activity: Not on file  Social Connections: Not on file  Stress: Not on file  Tobacco Use: Medium Risk  . Smoking Tobacco Use: Former Smoker  . Smokeless Tobacco Use: Never Used  Transportation Needs: Not on file    CCM Care Plan  Allergies  Allergen Reactions  . Lisinopril Rash    Medications Reviewed Today    Reviewed by Tomasa Blase, [facility] (Pharmacist) on 10/25/20 at [place] List Status: <None>  Medication Order Taking? Sig Documenting Provider Last Dose Status Informant  albuterol (VENTOLIN HFA) 108 (90 Base) MCG/ACT inhaler  [id number] No INHALE 1 TO 2 PUFFS BY MOUTH EVERY 6 HOURS AS NEEDED FOR WHEEZING FOR SHORTNESS OF BREATH  Patient not taking: Reported on 10/25/2020   [name], MD Not Taking Active Family Member           Med Note Liliane Bade Oct 05, 2020  2:32 PM) On hand  apixaban (ELIQUIS) 5 MG TABS tablet [id number] Yes Take 1 tablet (5 mg total) by mouth 2 (two) times daily. Damita Lack, MD Taking Active Family Member  fluticasone [facility]) 50 MCG/ACT nasal spray [id number] Yes Place 2 sprays into both nostrils daily.  Patient taking differently: Place 2 sprays into both nostrils daily as needed for allergies.   [name], MD Taking Active Family Member  furosemide (LASIX) 40 MG tablet [id number] Yes Take 1 tablet (40 mg total) by mouth daily.  Patient taking differently: Take 40 mg by mouth in the morning.   Damita Lack, MD Taking Active Family Member  metoprolol tartrate (LOPRESSOR) 25 MG tablet [id number] Yes Take 1 tablet (25 mg total) by mouth 2 (two) times daily. Damita Lack, MD Taking Active Family Member  Multiple Vitamin (MULTIVITAMIN WITH MINERALS) TABS tablet [id number] Yes Take 1 tablet by mouth in the morning. [provider] Taking Active Family Member   Polyethylene Glycol 3350 (MIRALAX PO) [id number] Yes Take 17 g by mouth. Daily if needed [provider] Taking Active   senna-docusate (SENOKOT-S) 8.6-50 MG tablet [id number] Yes Take 1 tablet by mouth at bedtime as needed for mild constipation or moderate constipation. Damita Lack, MD Taking Active Family Member  simvastatin (ZOCOR) 20 MG tablet [id number] Yes Take 1 tablet (20 mg total) by mouth daily at 6 PM. [name], MD Taking Active Family Member  tamsulosin [facility]) 0.4 MG CAPS capsule [id number] Yes Take 0.4 mg by mouth in the morning. [provider] Taking Active Family Member          Patient Active Problem List   Diagnosis Date Noted  . Pleural effusion   . Atrial fibrillation ([place])   . S/P thoracentesis   . SOB (shortness of breath) 09/15/2020  . Pleural effusion, right 09/15/2020  . Acute respiratory failure with hypoxia ([place]) 09/15/2020  . Mass of upper lobe of left lung 09/15/2020  . Atrial fibrillation, new onset ([place]) 09/15/2020  . Wheezing 04/24/2019  . Routine general medical examination at a health care facility 06/23/2014  . Inguinal hernia 05/05/2014  . Moderate aortic stenosis 10/30/2011  . Abnormal ECG 09/24/2010  . Type 2 diabetes mellitus with hyperlipidemia ([place]) 03/26/2010  . Hyperlipidemia associated with type 2 diabetes mellitus ([place]) 12/28/2007  . Essential hypertension 02/07/2007    Immunization History  Administered Date(s) Administered  . Fluad Quad(high Dose 65+) 03/05/2019, 04/07/2020  . Influenza Split 03/23/2012  . Influenza Whole 02/17/2009, 03/26/2010  . Influenza, High Dose Seasonal PF 04/16/2013, 03/22/2014, 01/12/2015, 02/10/2017, 03/20/2018  . Influenza,inj,Quad PF,6+ Mos 01/11/2016  . PFIZER Comirnaty(Gray Top)Covid-19 Tri-Sucrose Vaccine 02/18/2020  . PFIZER(Purple Top)SARS-COV-2 Vaccination 06/05/2019, 06/26/2019  . Pneumococcal Conjugate-13 01/12/2015  . Pneumococcal Polysaccharide-23  05/23/2003  . Td 10/14/2012    Conditions to be addressed/monitored:  Hypertension, Hyperlipidemia, Diabetes, Atrial Fibrillation and Chronic Kidney Disease  Care Plan : CCM Care Plan  Updates made by Tomasa Blase, RPH since 10/25/2020 12:00 AM    Problem: HTN, AFib, HLD, prediabetes, CKD   Priority: High  Onset Date: 10/25/2020    Long-Range Goal: Disease  Management   Start Date: 10/25/2020  Expected End Date: 04/26/2021  This Visit's Progress: On track  Priority: High  Note:    Current Barriers:  . Unable to independently monitor therapeutic efficacy  Pharmacist Clinical Goal(s):  Marland Kitchen Patient will verbalize ability to afford treatment regimen . achieve adherence to monitoring guidelines and medication adherence to achieve therapeutic efficacy . maintain control of LDL and BP as evidenced by lipid panel and blood pressure logs   through collaboration with PharmD and provider.   Interventions: . 1:1 collaboration with [name], MD regarding development and update of comprehensive plan of care as evidenced by provider attestation and co-signature . Inter-disciplinary care team collaboration (see longitudinal plan of care) . Comprehensive medication review performed; medication list updated in electronic medical record  Hyperlipidemia: (LDL goal < 100) -Controlled  - Last LDL 72 mg/dL (09/19/2020) -Current treatment: . Simvastatin 22m daily  -Medications previously tried: n/a  -Current dietary patterns: reports that diet is low in sodium / fried and fatty foods  -Current exercise habits: limited at this time  -Educated on Cholesterol goals;  Benefits of statin for ASCVD risk reduction; Importance of limiting foods high in cholesterol; -Counseled on diet and exercise extensively Recommended to continue current medication  Prediabetes (A1c goal <7%) -Controlled (diet) - Last A1c 6.2% (09/15/2020) -Current medications: . n/a -Medications previously tried: n/a   -Denies hypoglycemic/hyperglycemic symptoms -Current meal patterns:  . breakfast: eggs, bacon, small biscuit  . lunch: low sodium meats / bread  . dinner: homemade meals - watching carb intake very closely  -Current exercise: limited at this time  -Educated on A1c and blood sugar goals; Complications of diabetes including kidney damage, retinal damage, and cardiovascular disease; -Counseled to check feet daily and get yearly eye exams -Counseled on diet and exercise extensively  Atrial Fibrillation/ Hypertension (Goal: prevent stroke/ heart attack and major bleeding / Blood pressure <130/80) -Controlled -CHADSVASC: 3 -Current treatment: . Rate control: metoprolol tartrate 252mtwice daily  . Anticoagulation: Eliquis 43m84mwice daily   . Furosemide 32m51mily  -Medications previously tried: amlodipine, hctz, losartan  -Home BP and HR readings: ~120-125/80 HR~80 bpm  -Counseled on increased risk of stroke due to Afib and benefits of anticoagulation for stroke prevention; importance of adherence to anticoagulant exactly as prescribed; bleeding risk associated with Eliquis and importance of self-monitoring for signs/symptoms of bleeding; avoidance of NSAIDs due to increased bleeding risk with anticoagulants; seeking medical attention after a head injury or if there is blood in the urine/stool;  -Denies hypotensive/hypertensive symptoms -Educated on BP goals and benefits of medications for prevention of heart attack, stroke and kidney damage; Daily salt intake goal < 2300 mg; Importance of home blood pressure monitoring; -Counseled to monitor BP at home weekly, document, and provide log at future appointments -Recommended to continue current medication  Chronic Kidney Disease (Goal: Maintenance of kidney function / prevention of disease progression) -Controlled  - Most recent CrCl = 43.5 mL/min - Most recent eGFR = 55.91mL52m -Current treatment  . Appropriately dosed medications  based on most recent kidney function tests  -Recommended to continue current medication Counseled on avoidance of use of over the counter NSAIDS   Health Maintenance -Current therapy:  . Fluticasone 50mcg72m - 2 sprays into each nostril daily  . Multivitamin - 1 tablet daily  . Senna-docusate 8.6-50mg - 1 tablet daily as needed for constipation - uses about once a month . Miralax - 17g daily as needed for constipation - uses about once  a month . Albuterol HFA 134mg/act - 1-2 puffs every 6 hours as needed - uses about 1-2 times a month . Tamsulosin 0.466mdaily  -Educated on Cost vs benefit of each product must be carefully weighed by individual consumer -Patient is satisfied with current therapy and denies issues -Recommended to continue current medication   Patient Goals/Self-Care Activities . Patient will:  - take medications as prescribed check blood pressure weekly, document, and provide at future appointments  Follow Up Plan: Telephone follow up appointment with care management team member scheduled for: The patient has been provided with contact information for the care management team and has been advised to call with any health related questions or concerns.         Medication Assistance: Patient reports that he applied for patient assistance for [place] in the past but was denied, able to afford medications at this time   [facility] [id number] [id number]: [phone]: [phone] Uses pill box? Yes Pt endorses 100% compliance  Care Plan and Follow Up Patient Decision:  Patient requests no follow-up at this time.  Plan: Telephone follow up appointment with care management team member scheduled for:  3 months and The patient has been provided with contact information for the care management team and has been advised to call with any health related  questions or concerns.   DaTomasa BlasePharmD Clinical Pharmacist, [place]06/08/22

## 2020-10-25 ENCOUNTER — Other Ambulatory Visit: Payer: Self-pay

## 2020-10-25 ENCOUNTER — Encounter: Payer: Self-pay | Admitting: Pulmonary Disease

## 2020-10-25 ENCOUNTER — Ambulatory Visit (INDEPENDENT_AMBULATORY_CARE_PROVIDER_SITE_OTHER): Payer: Medicare Other | Admitting: Pulmonary Disease

## 2020-10-25 ENCOUNTER — Ambulatory Visit (INDEPENDENT_AMBULATORY_CARE_PROVIDER_SITE_OTHER): Payer: Medicare Other

## 2020-10-25 VITALS — BP 124/76 | HR 107 | Temp 97.8°F | Ht 70.0 in | Wt 160.0 lb

## 2020-10-25 DIAGNOSIS — R942 Abnormal results of pulmonary function studies: Secondary | ICD-10-CM | POA: Diagnosis not present

## 2020-10-25 DIAGNOSIS — I4891 Unspecified atrial fibrillation: Secondary | ICD-10-CM

## 2020-10-25 DIAGNOSIS — J90 Pleural effusion, not elsewhere classified: Secondary | ICD-10-CM

## 2020-10-25 DIAGNOSIS — Z7901 Long term (current) use of anticoagulants: Secondary | ICD-10-CM

## 2020-10-25 DIAGNOSIS — R599 Enlarged lymph nodes, unspecified: Secondary | ICD-10-CM

## 2020-10-25 DIAGNOSIS — R911 Solitary pulmonary nodule: Secondary | ICD-10-CM

## 2020-10-25 DIAGNOSIS — I10 Essential (primary) hypertension: Secondary | ICD-10-CM | POA: Diagnosis not present

## 2020-10-25 NOTE — Patient Instructions (Addendum)
Visit Information   PATIENT GOALS:  Goals Addressed            This Visit's Progress   . Track and Manage My Blood Pressure-Hypertension       Timeframe:  Long-Range Goal Priority:  High Start Date:  10/25/2020                           Expected End Date:  04/26/2021                     Follow Up Date 01/25/2021   - check blood pressure weekly - write blood pressure results in a log or diary    Why is this important?    You won't feel high blood pressure, but it can still hurt your blood vessels.   High blood pressure can cause heart or kidney problems. It can also cause a stroke.   Making lifestyle changes like losing a little weight or eating less salt will help.   Checking your blood pressure at home and at different times of the day can help to control blood pressure.   If the doctor prescribes medicine remember to take it the way the doctor ordered.   Call the office if you cannot afford the medicine or if there are questions about it.     Notes: Patient will reach out to clinic should BP become uncontrolled (>130/80) / HR become uncontrolled (HR >100 or <60 bpm)       Consent to CCM Services: Adam Briggs was given information about Chronic Care Management services today including:  1. CCM service includes personalized support from designated clinical staff supervised by his physician, including individualized plan of care and coordination with other care providers 2. 24/7 contact phone numbers for assistance for urgent and routine care needs. 3. Service will only be billed when office clinical staff spend 20 minutes or more in a month to coordinate care. 4. Only one practitioner may furnish and bill the service in a calendar month. 5. The patient may stop CCM services at any time (effective at the end of the month) by phone call to the office staff. 6. The patient will be responsible for cost sharing (co-pay) of up to 20% of the service fee (after annual deductible is  met).  Patient agreed to services and verbal consent obtained.   Patient verbalizes understanding of instructions provided today and agrees to view in [place].   Telephone follow up appointment with care management team member scheduled for: 3 months  The patient has been provided with contact information for the care management team and has been advised to call with any health related questions or concerns.   Tomasa Blase, PharmD Clinical Pharmacist, [place]    CLINICAL CARE PLAN: Patient Care Plan: CCM Care Plan    Problem Identified: HTN, AFib, HLD, prediabetes, CKD   Priority: High  Onset Date: 10/25/2020    Long-Range Goal: Disease Management   Start Date: 10/25/2020  Expected End Date: 04/26/2021  This Visit's Progress: On track  Priority: High  Note:    Current Barriers:  . Unable to independently monitor therapeutic efficacy  Pharmacist Clinical Goal(s):  Marland Kitchen Patient will verbalize ability to afford treatment regimen . achieve adherence to monitoring guidelines and medication adherence to achieve therapeutic efficacy . maintain control of LDL and BP as evidenced by lipid panel and blood pressure logs   through collaboration with PharmD and  provider.   Interventions: . 1:1 collaboration with [name], MD regarding development and update of comprehensive plan of care as evidenced by provider attestation and co-signature . Inter-disciplinary care team collaboration (see longitudinal plan of care) . Comprehensive medication review performed; medication list updated in electronic medical record  Hyperlipidemia: (LDL goal < 100) -Controlled  - Last LDL 72 mg/dL (09/19/2020) -Current treatment: . Simvastatin 73m daily  -Medications previously tried: n/a  -Current dietary patterns: reports that diet is low in sodium / fried and fatty foods  -Current exercise habits: limited at this time  -Educated on Cholesterol goals;  Benefits of statin for  ASCVD risk reduction; Importance of limiting foods high in cholesterol; -Counseled on diet and exercise extensively Recommended to continue current medication  Prediabetes (A1c goal <7%) -Controlled (diet) - Last A1c 6.2% (09/15/2020) -Current medications: . n/a -Medications previously tried: n/a  -Denies hypoglycemic/hyperglycemic symptoms -Current meal patterns:  . breakfast: eggs, bacon, small biscuit  . lunch: low sodium meats / bread  . dinner: homemade meals - watching carb intake very closely  -Current exercise: limited at this time  -Educated on A1c and blood sugar goals; Complications of diabetes including kidney damage, retinal damage, and cardiovascular disease; -Counseled to check feet daily and get yearly eye exams -Counseled on diet and exercise extensively  Atrial Fibrillation/ Hypertension (Goal: prevent stroke/ heart attack and major bleeding / Blood pressure <130/80) -Controlled -CHADSVASC: 3 -Current treatment: . Rate control: metoprolol tartrate 228mtwice daily  . Anticoagulation: Eliquis 33m36mwice daily   . Furosemide 60m82mily  -Medications previously tried: amlodipine, hctz, losartan  -Home BP and HR readings: ~120-125/80 HR~80 bpm  -Counseled on increased risk of stroke due to Afib and benefits of anticoagulation for stroke prevention; importance of adherence to anticoagulant exactly as prescribed; bleeding risk associated with Eliquis and importance of self-monitoring for signs/symptoms of bleeding; avoidance of NSAIDs due to increased bleeding risk with anticoagulants; seeking medical attention after a head injury or if there is blood in the urine/stool;  -Denies hypotensive/hypertensive symptoms -Educated on BP goals and benefits of medications for prevention of heart attack, stroke and kidney damage; Daily salt intake goal < 2300 mg; Importance of home blood pressure monitoring; -Counseled to monitor BP at home weekly, document, and provide log at  future appointments -Recommended to continue current medication  Chronic Kidney Disease (Goal: Maintenance of kidney function / prevention of disease progression) -Controlled  - Most recent CrCl = 43.5 mL/min - Most recent eGFR = 55.91mL23m -Current treatment  . Appropriately dosed medications based on most recent kidney function tests  -Recommended to continue current medication Counseled on avoidance of use of over the counter NSAIDS   Health Maintenance -Current therapy:  . Fluticasone 50mcg67m - 2 sprays into each nostril daily  . Multivitamin - 1 tablet daily  . Senna-docusate 8.6-50mg - 1 tablet daily as needed for constipation - uses about once a month . Miralax - 17g daily as needed for constipation - uses about once a month . Albuterol HFA 108mcg/23m- 1-2 puffs every 6 hours as needed - uses about 1-2 times a month . Tamsulosin 0.4mg dai98m -Educated on Cost vs benefit of each product must be carefully weighed by individual consumer -Patient is satisfied with current therapy and denies issues -Recommended to continue current medication   Patient Goals/Self-Care Activities . Patient will:  - take medications as prescribed check blood pressure weekly, document, and provide at future appointments  Follow Up Plan: Telephone follow  up appointment with care management team member scheduled for: The patient has been provided with contact information for the care management team and has been advised to call with any health related questions or concerns.        Hypertension, Adult Hypertension is another name for high blood pressure. High blood pressure forces your heart to work harder to pump blood. This can cause problems over time. There are two numbers in a blood pressure reading. There is a top number (systolic) over a bottom number (diastolic). It is best to have a blood pressure that is below 120/80. Healthy choices can help lower your blood pressure, or you may need  medicine to help lower it. What are the causes? The cause of this condition is not known. Some conditions may be related to high blood pressure. What increases the risk?  Smoking.  Having type 2 diabetes mellitus, high cholesterol, or both.  Not getting enough exercise or physical activity.  Being overweight.  Having too much fat, sugar, calories, or salt (sodium) in your diet.  Drinking too much alcohol.  Having long-term (chronic) kidney disease.  Having a family history of high blood pressure.  Age. Risk increases with age.  Race. You may be at higher risk if you are African American.  Gender. Men are at higher risk than women before age 60. After age 105, women are at higher risk than men.  Having obstructive sleep apnea.  Stress. What are the signs or symptoms?  High blood pressure may not cause symptoms. Very high blood pressure (hypertensive crisis) may cause: ? Headache. ? Feelings of worry or nervousness (anxiety). ? Shortness of breath. ? Nosebleed. ? A feeling of being sick to your stomach (nausea). ? Throwing up (vomiting). ? Changes in how you see. ? Very bad chest pain. ? Seizures. How is this treated?  This condition is treated by making healthy lifestyle changes, such as: ? Eating healthy foods. ? Exercising more. ? Drinking less alcohol.  Your health care provider may prescribe medicine if lifestyle changes are not enough to get your blood pressure under control, and if: ? Your top number is above 130. ? Your bottom number is above 80.  Your personal target blood pressure may vary. Follow these instructions at home: Eating and drinking  If told, follow the DASH eating plan. To follow this plan: ? Fill one half of your plate at each meal with fruits and vegetables. ? Fill one fourth of your plate at each meal with whole grains. Whole grains include whole-wheat pasta, brown rice, and whole-grain bread. ? Eat or drink low-fat dairy products,  such as skim milk or low-fat yogurt. ? Fill one fourth of your plate at each meal with low-fat (lean) proteins. Low-fat proteins include fish, chicken without skin, eggs, beans, and tofu. ? Avoid fatty meat, cured and processed meat, or chicken with skin. ? Avoid pre-made or processed food.  Eat less than 1,500 mg of salt each day.  Do not drink alcohol if: ? Your doctor tells you not to drink. ? You are pregnant, may be pregnant, or are planning to become pregnant.  If you drink alcohol: ? Limit how much you use to:  0-1 drink a day for women.  0-2 drinks a day for men. ? Be aware of how much alcohol is in your drink. In the [place] one drink equals one 12 oz bottle of beer (355 mL), one 5 oz glass of wine (148 mL), or one 1 oz  glass of hard liquor (44 mL).   Lifestyle  Work with your doctor to stay at a healthy weight or to lose weight. Ask your doctor what the best weight is for you.  Get at least 30 minutes of exercise most days of the week. This may include walking, swimming, or biking.  Get at least 30 minutes of exercise that strengthens your muscles (resistance exercise) at least 3 days a week. This may include lifting weights or doing Pilates.  Do not use any products that contain nicotine or tobacco, such as cigarettes, e-cigarettes, and chewing tobacco. If you need help quitting, ask your doctor.  Check your blood pressure at home as told by your doctor.  Keep all follow-up visits as told by your doctor. This is important.   Medicines  Take over-the-counter and prescription medicines only as told by your doctor. Follow directions carefully.  Do not skip doses of blood pressure medicine. The medicine does not work as well if you skip doses. Skipping doses also puts you at risk for problems.  Ask your doctor about side effects or reactions to medicines that you should watch for. Contact a doctor if you:  Think you are having a reaction to the medicine you are  taking.  Have headaches that keep coming back (recurring).  Feel dizzy.  Have swelling in your ankles.  Have trouble with your vision. Get help right away if you:  Get a very bad headache.  Start to feel mixed up (confused).  Feel weak or numb.  Feel faint.  Have very bad pain in your: ? Chest. ? Belly (abdomen).  Throw up more than once.  Have trouble breathing. Summary  Hypertension is another name for high blood pressure.  High blood pressure forces your heart to work harder to pump blood.  For most people, a normal blood pressure is less than 120/80.  Making healthy choices can help lower blood pressure. If your blood pressure does not get lower with healthy choices, you may need to take medicine. This information is not intended to replace advice given to you by your health care provider. Make sure you discuss any questions you have with your health care provider. Document Revised: 01/14/2018 Document Reviewed: 01/14/2018 Elsevier Patient Education  2021 [place].  Atrial Fibrillation  Atrial fibrillation is a type of heartbeat that is irregular or fast. If you have this condition, your heart beats without any order. This makes it hard for your heart to pump blood in a normal way. Atrial fibrillation may come and go, or it may become a long-lasting problem. If this condition is not treated, it can put you at higher risk for stroke, heart failure, and other heart problems. What are the causes? This condition may be caused by diseases that damage the heart. They include:  High blood pressure.  Heart failure.  Heart valve disease.  Heart surgery. Other causes include:  Diabetes.  Thyroid disease.  Being overweight.  Kidney disease. Sometimes the cause is not known. What increases the risk? You are more likely to develop this condition if:  You are older.  You smoke.  You exercise often and very hard.  You have a family history of this  condition.  You are a man.  You use drugs.  You drink a lot of alcohol.  You have lung conditions, such as emphysema, pneumonia, or COPD.  You have sleep apnea. What are the signs or symptoms? Common symptoms of this condition include:  A feeling  that your heart is beating very fast.  Chest pain or discomfort.  Feeling short of breath.  Suddenly feeling light-headed or weak.  Getting tired easily during activity.  Fainting.  Sweating. In some cases, there are no symptoms. How is this treated? Treatment for this condition depends on underlying conditions and how you feel when you have atrial fibrillation. They include:  Medicines to: ? Prevent blood clots. ? Treat heart rate or heart rhythm problems.  Using devices, such as a pacemaker, to correct heart rhythm problems.  Doing surgery to remove the part of the heart that sends bad signals.  Closing an area where clots can form in the heart (left atrial appendage). In some cases, your doctor will treat other underlying conditions. Follow these instructions at home: Medicines  Take over-the-counter and prescription medicines only as told by your doctor.  Do not take any new medicines without first talking to your doctor.  If you are taking blood thinners: ? Talk with your doctor before you take any medicines that have aspirin or NSAIDs, such as ibuprofen, in them. ? Take your medicine exactly as told by your doctor. Take it at the same time each day. ? Avoid activities that could hurt or bruise you. Follow instructions about how to prevent falls. ? Wear a bracelet that says you are taking blood thinners. Or, carry a card that lists what medicines you take. Lifestyle  Do not use any products that have nicotine or tobacco in them. These include cigarettes, e-cigarettes, and chewing tobacco. If you need help quitting, ask your doctor.  Eat heart-healthy foods. Talk with your doctor about the right eating plan for  you.  Exercise regularly as told by your doctor.  Do not drink alcohol.  Lose weight if you are overweight.  Do not use drugs, including cannabis.      General instructions  If you have a condition that causes breathing to stop for a short period of time (apnea), treat it as told by your doctor.  Keep a healthy weight. Do not use diet pills unless your doctor says they are safe for you. Diet pills may make heart problems worse.  Keep all follow-up visits as told by your doctor. This is important. Contact a doctor if:  You notice a change in the speed, rhythm, or strength of your heartbeat.  You are taking a blood-thinning medicine and you get more bruising.  You get tired more easily when you move or exercise.  You have a sudden change in weight. Get help right away if:  You have pain in your chest or your belly (abdomen).  You have trouble breathing.  You have side effects of blood thinners, such as blood in your vomit, poop (stool), or pee (urine), or bleeding that cannot stop.  You have any signs of a stroke. "BE FAST" is an easy way to remember the main warning signs: ? B - Balance. Signs are dizziness, sudden trouble walking, or loss of balance. ? E - Eyes. Signs are trouble seeing or a change in how you see. ? F - Face. Signs are sudden weakness or loss of feeling in the face, or the face or eyelid drooping on one side. ? A - Arms. Signs are weakness or loss of feeling in an arm. This happens suddenly and usually on one side of the body. ? S - Speech. Signs are sudden trouble speaking, slurred speech, or trouble understanding what people say. ? T - Time. Time to call  emergency services. Write down what time symptoms started.  You have other signs of a stroke, such as: ? A sudden, very bad headache with no known cause. ? Feeling like you may vomit (nausea). ? Vomiting. ? A seizure. These symptoms may be an emergency. Do not wait to see if the symptoms will go  away. Get medical help right away. Call your local emergency services (911 in the [place] Do not drive yourself to the hospital.   Summary  Atrial fibrillation is a type of heartbeat that is irregular or fast.  You are at higher risk of this condition if you smoke, are older, have diabetes, or are overweight.  Follow your doctor's instructions about medicines, diet, exercise, and follow-up visits.  Get help right away if you have signs or symptoms of a stroke.  Get help right away if you cannot catch your breath, or you have chest pain or discomfort. This information is not intended to replace advice given to you by your health care provider. Make sure you discuss any questions you have with your health care provider. Document Revised: 10/28/2018 Document Reviewed: 10/28/2018 Elsevier Patient Education  [place].

## 2020-10-25 NOTE — Patient Instructions (Signed)
Thank you for visiting Dr. [name] at [facility]. Today we recommend the following:  Orders Placed This Encounter  Procedures  . Ambulatory referral to Radiation Oncology   Return in about 3 months (around 01/25/2021).    Please do your part to reduce the spread of COVID-19.

## 2020-10-25 NOTE — Progress Notes (Signed)
Synopsis: Referred in May 2022 for abnormal CT chest, abnormal PET scan by [name], *  Subjective:   PATIENT ID: Adam Briggs GENDER: male DOB: 1929/11/24, MRN: [id number]  Chief Complaint  Patient presents with  . Follow-up    Productive cough with white mucus    This is an 87 year old gentleman, former smoker quit in 1968, approximate 17-pack-year history.  Patient has a past medical history of reflux, diabetes, hypertension, hypercholesterolemia, prostate cancer.  Patient was recently admitted to the hospital with a right-sided loculated pleural effusion.  Patient was discharged from the hospital on 09/28/2020.  In the hospital the patient was found to have right-sided loculated effusion with pleural thickening.  Patient underwent pigtail catheter placement after a thoracentesis on 09/16/2020.  Right-sided loculated effusion with a left upper lobe nodule and mediastinal adenopathy there was concern for malignancy.  There is also a hypodense lesion within the liver.  Recommended outpatient PET scan.  Cytology was sent off from the pleural fluid.  Both cytologies were negative.  No evidence of malignancy.  Additionally patient's pleural fluid on 09/16/2020 revealed 339 nucleated cells and 92% lymphocytes.  Patient was discharged from the hospital with a ex vacuo pneumothorax on chest x-ray.  A PET scan was completed on 10/02/2020.  This revealed a 21 mm left upper lobe hypermetabolic lung mass with an SUV max of 11.5 concerning for primary bronchogenic carcinoma.  Additionally there was hypermetabolic subcarinal lymph nodes concerning for metastatic disease.  And small bilateral paratracheal adenopathy with low-level hypermetabolic uptake.  There was what was considered a right-sided loculated hydropneumothorax.  This looks stable in comparison to previous images during his hospitalization.  From a respiratory standpoint the patient is doing well has no significant complaints.  He is on  Eliquis for atrial fibrillation.  He has not had general anesthesia in some time.  Has a follow-up scheduled with cardiology soon.  OV 10/25/2020: Here today for follow-up after recent bronchoscopy.  All culture results tissue biopsy of the left upper lobe nodule and subcarinal node were negative.  He does have atypical cells not enough to call malignancy in the left upper lobe brushings.  He does have hypermetabolic PET uptake.  I still think this is a primary lung cancer. I am not really sure what to do with the right sided pleural effusion and loculated hydropneumothorax.  He appears to be asymptomatic.  I am not sure if this is related to malignancy or not we discussed this in detail today in the office.  We also explained why we made the decision not to place an indwelling pleural catheter due to the heavy amount of sludge in the right chest seen under ultrasound at the time.  I do not think the thin IPC catheter will drain the material.  We discussed this today as well.    Past Medical History:  Diagnosis Date  . Atrial fibrillation ([place])   . BENIGN PROSTATIC HYPERTROPHY   . Cataract   . DIVERTICULOSIS, COLON   . DM    not diabetic-has been off metformin for about 2 years as of 09/2020 pr daughter  . GERD   . Heart murmur   . Hernia   . HYPERCHOLESTEROLEMIA   . HYPERTENSION   . Moderate aortic stenosis 10/30/2011   echo 2013  . Prostate cancer [facility])    s/p seed implant     Family History  Problem Relation Age of Onset  . Heart attack Paternal Uncle  in his 76s  . Atrial fibrillation Daughter   . Colon cancer Neg Hx      Past Surgical History:  Procedure Laterality Date  . BRONCHIAL BIOPSY  10/17/2020   Procedure: BRONCHIAL BIOPSIES;  Surgeon: Garner Nash, DO;  Location: [place] [facility];  Service: Pulmonary;;  . BRONCHIAL BRUSHINGS  10/17/2020   Procedure: BRONCHIAL BRUSHINGS;  Surgeon: Garner Nash, DO;  Location: [place] [facility];  Service: Pulmonary;;  . BRONCHIAL  NEEDLE ASPIRATION BIOPSY  10/17/2020   Procedure: BRONCHIAL NEEDLE ASPIRATION BIOPSIES;  Surgeon: Garner Nash, DO;  Location: [place] [facility];  Service: Pulmonary;;  . BRONCHIAL WASHINGS  10/17/2020   Procedure: BRONCHIAL WASHINGS;  Surgeon: Garner Nash, DO;  Location: [place] [facility];  Service: Pulmonary;;  . CATARACT EXTRACTION W/ INTRAOCULAR LENS IMPLANT  02/2013   R, then L 4 weeks later - [place]  . FIDUCIAL MARKER PLACEMENT  10/17/2020   Procedure: FIDUCIAL MARKER PLACEMENT;  Surgeon: Garner Nash, DO;  Location: [place] [facility];  Service: Pulmonary;;  . INGUINAL HERNIA REPAIR     Right  . INGUINAL HERNIA REPAIR Left 07/15/2014   Procedure: LEFT INGUINAL HERNIA REPAIR WITH MESH;  Surgeon: Armandina Gemma, MD;  Location: [facility];  Service: General;  Laterality: Left;  . INSERTION OF MESH Left 07/15/2014   Procedure: INSERTION OF MESH;  Surgeon: Armandina Gemma, MD;  Location: [facility];  Service: General;  Laterality: Left;  . RADIOACTIVE SEED IMPLANT    . VIDEO BRONCHOSCOPY WITH ENDOBRONCHIAL NAVIGATION Left 10/17/2020   Procedure: VIDEO BRONCHOSCOPY WITH ENDOBRONCHIAL NAVIGATION;  Surgeon: Garner Nash, DO;  Location: [place];  Service: Pulmonary;  Laterality: Left;  Marland Kitchen VIDEO BRONCHOSCOPY WITH ENDOBRONCHIAL ULTRASOUND Bilateral 10/17/2020   Procedure: VIDEO BRONCHOSCOPY WITH ENDOBRONCHIAL ULTRASOUND;  Surgeon: Garner Nash, DO;  Location: [place];  Service: Pulmonary;  Laterality: Bilateral;    Social History   Socioeconomic History  . Marital status: Widowed    Spouse name: Not on file  . Number of children: 2  . Years of education: Not on file  . Highest education level: Not on file  Occupational History  . Occupation: Retired     Comment: Retired  Immunologist  . Smoking status: Former Smoker    Packs/day: 0.50    Years: 35.00    Pack years: 17.50    Types: Cigarettes    Quit date: 10/30/1966    Years since quitting: 54.0  . Smokeless tobacco: Never Used  Vaping  Use  . Vaping Use: Never used  Substance and Sexual Activity  . Alcohol use: No  . Drug use: No  . Sexual activity: Not on file  Other Topics Concern  . Not on file  Social History Narrative   Pt says his diet is excellent   Daily caffeine    Social Determinants of Health   Financial Resource Strain: Not on file  Food Insecurity: Not on file  Transportation Needs: Not on file  Physical Activity: Not on file  Stress: Not on file  Social Connections: Not on file  Intimate Partner Violence: Not on file     Allergies  Allergen Reactions  . Lisinopril Rash     Outpatient Medications Prior to Visit  Medication Sig Dispense Refill  . albuterol (VENTOLIN HFA) 108 (90 Base) MCG/ACT inhaler INHALE 1 TO 2 PUFFS BY MOUTH EVERY 6 HOURS AS NEEDED FOR WHEEZING FOR SHORTNESS OF BREATH 9 g 0  . apixaban (ELIQUIS) 5 MG TABS tablet Take 1 tablet (5 mg  total) by mouth 2 (two) times daily. 60 tablet 0  . fluticasone (FLONASE) 50 MCG/ACT nasal spray Place 2 sprays into both nostrils daily. (Patient taking differently: Place 2 sprays into both nostrils daily as needed for allergies.) 16 g 6  . furosemide (LASIX) 40 MG tablet Take 1 tablet (40 mg total) by mouth daily. (Patient taking differently: Take 40 mg by mouth in the morning.) 30 tablet 0  . metoprolol tartrate (LOPRESSOR) 25 MG tablet Take 1 tablet (25 mg total) by mouth 2 (two) times daily. 60 tablet 0  . Multiple Vitamin (MULTIVITAMIN WITH MINERALS) TABS tablet Take 1 tablet by mouth in the morning.    . Polyethylene Glycol 3350 (MIRALAX PO) Take 17 g by mouth. Daily if needed    . senna-docusate (SENOKOT-S) 8.6-50 MG tablet Take 1 tablet by mouth at bedtime as needed for mild constipation or moderate constipation. 30 tablet 1  . simvastatin (ZOCOR) 20 MG tablet Take 1 tablet (20 mg total) by mouth daily at 6 PM. 90 tablet 3  . tamsulosin (FLOMAX) 0.4 MG CAPS capsule Take 0.4 mg by mouth in the morning.     No facility-administered  medications prior to visit.    Review of Systems  Constitutional: Negative for chills, fever, malaise/fatigue and weight loss.  HENT: Negative for hearing loss, sore throat and tinnitus.   Eyes: Negative for blurred vision and double vision.  Respiratory: Positive for shortness of breath. Negative for cough, hemoptysis, sputum production, wheezing and stridor.   Cardiovascular: Negative for chest pain, palpitations, orthopnea, leg swelling and PND.  Gastrointestinal: Negative for abdominal pain, constipation, diarrhea, heartburn, nausea and vomiting.  Genitourinary: Negative for dysuria, hematuria and urgency.  Musculoskeletal: Negative for joint pain and myalgias.  Skin: Negative for itching and rash.  Neurological: Negative for dizziness, tingling, weakness and headaches.  Endo/Heme/Allergies: Negative for environmental allergies. Does not bruise/bleed easily.  Psychiatric/Behavioral: Negative for depression. The patient is not nervous/anxious and does not have insomnia.   All other systems reviewed and are negative.    Objective:  Physical Exam Vitals reviewed.  Constitutional:      General: He is not in acute distress.    Appearance: He is well-developed.  HENT:     Head: Normocephalic and atraumatic.  Eyes:     General: No scleral icterus.    Conjunctiva/sclera: Conjunctivae normal.     Pupils: Pupils are equal, round, and reactive to light.  Neck:     Vascular: No JVD.     Trachea: No tracheal deviation.  Cardiovascular:     Rate and Rhythm: Normal rate and regular rhythm.     Heart sounds: Murmur heard.    Pulmonary:     Effort: Pulmonary effort is normal. No tachypnea, accessory muscle usage or respiratory distress.     Breath sounds: No stridor. No wheezing, rhonchi or rales.     Comments: diminished in the right base  Abdominal:     General: Bowel sounds are normal. There is no distension.     Palpations: Abdomen is soft.     Tenderness: There is no abdominal  tenderness.  Musculoskeletal:        General: No tenderness.     Cervical back: Neck supple.  Lymphadenopathy:     Cervical: No cervical adenopathy.  Skin:    General: Skin is warm and dry.     Capillary Refill: Capillary refill takes less than 2 seconds.     Findings: No rash.  Neurological:  Mental Status: He is alert and oriented to person, place, and time.  Psychiatric:        Behavior: Behavior normal.      Vitals:   10/25/20 1520  BP: 124/76  Pulse: (!) 107  Temp: 97.8 F (36.6 C)  SpO2: 94%  Weight: 160 lb (72.6 kg)  Height: 5\' 10"  (1.778 m)   94% on RA BMI Readings from Last 3 Encounters:  10/25/20 22.96 kg/m  10/17/20 22.52 kg/m  10/05/20 22.81 kg/m   Wt Readings from Last 3 Encounters:  10/25/20 160 lb (72.6 kg)  10/17/20 156 lb 15.5 oz (71.2 kg)  10/05/20 159 lb (72.1 kg)     CBC    Component Value Date/Time   WBC 5.4 10/05/2020 1051   RBC 4.56 10/05/2020 1051   HGB 11.8 (L) 10/05/2020 1051   HCT 36.4 (L) 10/05/2020 1051   PLT 231.0 10/05/2020 1051   MCV 80.0 10/05/2020 1051   MCH 25.4 (L) 09/25/2020 0431   MCHC 32.5 10/05/2020 1051   RDW 17.2 (H) 10/05/2020 1051   LYMPHSABS 0.7 09/25/2020 0431   MONOABS 0.7 09/25/2020 0431   EOSABS 0.1 09/25/2020 0431   BASOSABS 0.0 09/25/2020 0431     Chest Imaging:  Nuclear medicine pet imaging: 7/67/3419 Hypermetabolic left upper lobe lesion SUV max 11 concerning for primary bronchogenic carcinoma, associated mediastinal subcarinal adenopathy with elevated SUV concerning for metastasis. There is hypermetabolic uptake within the pleural region with the associated hot loculated hydropneumothorax. The patient's images have been independently reviewed by me.    Pulmonary Functions Testing Results: No flowsheet data found.  FeNO:   Pathology:   10/17/2020 A. LYMPH NODE, 7, FINE NEEDLE ASPIRATION:  - No malignant cells identified  - Lymphoid tissue present   B. LUNG, LUL, BRUSHING:  -  Atypical cells present  - See comment   C. LUNG, LUL, FINE NEEDLE ASPIRATION:  - No malignant cells identified   FINAL MICROSCOPIC DIAGNOSIS:  - No malignant cells identified  Reviewed path results today in the office.  Echocardiogram:   Heart Catheterization:     Assessment & Plan:     ICD-10-CM   1. Lung nodule  R91.1 Ambulatory referral to Radiation Oncology  2. Abnormal PET of left lung  R94.2 Ambulatory referral to Radiation Oncology  3. Pleural effusion  J90   4. Adenopathy  R59.9   5. On continuous oral anticoagulation  Z79.01     Discussion:  87 year old gentleman, atrial fibrillation on apixaban, aspirin 81 mg.  Recently admitted to the hospital for a loculated right-sided pleural effusion status postthoracentesis and pigtail catheter placement.  Effusion drained it was lymphocyte predominant low protein elevated LDH, glucose and cultures negative.  He had cytology for this fluid x2 which was negative in the hospital.  Now left with a residual loculated hydropneumothorax.  Under recent ultrasound at his plan for bronchoscopy and IPC placement he had predominant sludge in the right chest.  I did not think an IPC would drain this effectively therefore that procedure was aborted.  Plan: He still has a hypermetabolic left upper lobe nodule with atypical cells from that region.  I still believe this is a primary lung cancer even though we do not have a definitive diagnosis. I will refer to radiation oncology for considerations of empiric SBRT.  As for the right-sided loculated hydropneumothorax we just need to follow this and see what happens to the chest.  I suspect it is, scar down over time.  Patient to  return to clinic to see me in approximately 3 months. Continue albuterol for shortness of breath and wheezing.    Current Outpatient Medications:  .  albuterol (VENTOLIN HFA) 108 (90 Base) MCG/ACT inhaler, INHALE 1 TO 2 PUFFS BY MOUTH EVERY 6 HOURS AS NEEDED FOR WHEEZING  FOR SHORTNESS OF BREATH, Disp: 9 g, Rfl: 0 .  apixaban (ELIQUIS) 5 MG TABS tablet, Take 1 tablet (5 mg total) by mouth 2 (two) times daily., Disp: 60 tablet, Rfl: 0 .  fluticasone (FLONASE) 50 MCG/ACT nasal spray, Place 2 sprays into both nostrils daily. (Patient taking differently: Place 2 sprays into both nostrils daily as needed for allergies.), Disp: 16 g, Rfl: 6 .  furosemide (LASIX) 40 MG tablet, Take 1 tablet (40 mg total) by mouth daily. (Patient taking differently: Take 40 mg by mouth in the morning.), Disp: 30 tablet, Rfl: 0 .  metoprolol tartrate (LOPRESSOR) 25 MG tablet, Take 1 tablet (25 mg total) by mouth 2 (two) times daily., Disp: 60 tablet, Rfl: 0 .  Multiple Vitamin (MULTIVITAMIN WITH MINERALS) TABS tablet, Take 1 tablet by mouth in the morning., Disp: , Rfl:  .  Polyethylene Glycol 3350 (MIRALAX PO), Take 17 g by mouth. Daily if needed, Disp: , Rfl:  .  senna-docusate (SENOKOT-S) 8.6-50 MG tablet, Take 1 tablet by mouth at bedtime as needed for mild constipation or moderate constipation., Disp: 30 tablet, Rfl: 1 .  simvastatin (ZOCOR) 20 MG tablet, Take 1 tablet (20 mg total) by mouth daily at 6 PM., Disp: 90 tablet, Rfl: 3 .  tamsulosin (FLOMAX) 0.4 MG CAPS capsule, Take 0.4 mg by mouth in the morning., Disp: , Rfl:     Garner Nash, DO [facility] 10/25/2020 3:47 PM

## 2020-10-26 ENCOUNTER — Ambulatory Visit: Payer: Medicare Other

## 2020-10-29 ENCOUNTER — Other Ambulatory Visit: Payer: Self-pay | Admitting: Radiation Oncology

## 2020-10-30 ENCOUNTER — Other Ambulatory Visit: Payer: Self-pay

## 2020-10-30 ENCOUNTER — Telehealth: Payer: Self-pay | Admitting: Internal Medicine

## 2020-10-30 MED ORDER — METOPROLOL TARTRATE 25 MG PO TABS: 25.0000 mg | ORAL_TABLET | Freq: Two times a day (BID) | ORAL | 0 refills | Status: DC

## 2020-10-30 MED ORDER — FUROSEMIDE 40 MG PO TABS: 40.0000 mg | ORAL_TABLET | Freq: Every day | ORAL | 0 refills | Status: DC

## 2020-10-30 MED ORDER — APIXABAN 5 MG PO TABS: 5.0000 mg | ORAL_TABLET | Freq: Two times a day (BID) | ORAL | 0 refills | Status: DC

## 2020-10-30 NOTE — Telephone Encounter (Signed)
Medication refilled

## 2020-10-30 NOTE — Progress Notes (Signed)
Thoracic Location of Tumor / Histology: Left Upper Lobe Lung  Patient presented to the ER with difficulty with breathing.  Found to have some thickening in her right chest and pleural effusion.  PET 8/78/6767: Hypermetabolic left upper lobe lesion SUV max 11 concerning for primary bronchogenic carcinoma, associated mediastinal subcarinal adenopathy with elevated SUV concerning for metastasis.  There is hypermetabolic uptake within the pleural region with the associated hot loculated hydropneumothorax.  CT Chest 09/20/2020: Indeterminate 2.3 cm soft tissue nodule within the left apex suspicious for a primary bronchogenic neoplasm.  Biopsies of LUL Lung/Lymph Node 10/17/2020     Tobacco/Marijuana/Snuff/ETOH use: Former smoker, quit about 40+ years ago.  Past/Anticipated interventions by Pulmonary, if any: Dr. [name] 10/25/2020 -He still has a hypermetabolic left upper lobe nodule with atypical cells from that region.   -I still believe this is a primary lung cancer even though we do not have a definitive diagnosis. -I will refer to radiation oncology for considerations of empiric SBRT.   Past/Anticipated interventions by cardiothoracic surgery, if any:   Past/Anticipated interventions by medical oncology, if any:  No appointment yet   Signs/Symptoms Weight changes, if any: No, stable Respiratory complaints, if any: No difficulty noted.  Daughter states his O2 levels range about 96-97%. Hemoptysis, if any: Productive cough, "white foamy stuff".  No hemoptysis noted. Pain issues, if any:  no  SAFETY ISSUES: Prior radiation? Prostate Seed implant about 15 years ago. Pacemaker/ICD? No Possible current pregnancy? N/a Is the patient on methotrexate? No  Current Complaints / other details:   -A-Fib: Treated with eliquis and metoprolol.

## 2020-10-30 NOTE — Telephone Encounter (Signed)
   Patient daughter calling  to request refill for  apixaban (ELIQUIS) 5 MG TABS tablet  furosemide (LASIX) 40 MG tablet  metoprolol tartrate (LOPRESSOR) 25 MG tablet   [place], [place]

## 2020-10-31 ENCOUNTER — Encounter: Payer: Self-pay | Admitting: Radiation Oncology

## 2020-10-31 ENCOUNTER — Telehealth: Payer: Self-pay | Admitting: Internal Medicine

## 2020-10-31 ENCOUNTER — Ambulatory Visit
Admission: RE | Admit: 2020-10-31 | Discharge: 2020-10-31 | Disposition: A | Discharge status: Home / Self Care | Payer: Medicare Other | Source: Ambulatory Visit | Attending: Radiation Oncology | Admitting: Radiation Oncology

## 2020-10-31 ENCOUNTER — Other Ambulatory Visit: Payer: Self-pay

## 2020-10-31 DIAGNOSIS — C3412 Malignant neoplasm of upper lobe, left bronchus or lung: Secondary | ICD-10-CM | POA: Diagnosis not present

## 2020-10-31 DIAGNOSIS — Z87891 Personal history of nicotine dependence: Secondary | ICD-10-CM | POA: Diagnosis not present

## 2020-10-31 NOTE — Telephone Encounter (Signed)
Error

## 2020-10-31 NOTE — Progress Notes (Signed)
Radiation Oncology         [phone] ________________________________  Initial Outpatient Consultation - Conducted via telephone due to current COVID-19 concerns for limiting patient exposure  I spoke with the patient to conduct this consult visit via telephone to spare the patient unnecessary potential exposure in the healthcare setting during the current COVID-19 pandemic. The patient was notified in advance and was offered a [place] meeting to allow for face to face communication but unfortunately reported that they did not have the appropriate resources/technology to support such a visit and instead preferred to proceed with a telephone consult.   Name: Adam Briggs        MRN: [id number]  Date of Service: 10/31/2020 DOB: November 11, 1929  GY:[id number], Real Cons, MD  Garner Nash, DO     REFERRING PHYSICIAN: Garner Nash, DO   DIAGNOSIS: The encounter diagnosis was Malignant neoplasm of upper lobe of left lung ([place]).   HISTORY OF PRESENT ILLNESS: Adam Briggs is a 87 y.o. male seen at the request of Dr. [name] for a putative lung cancer.  Adam Briggs presented to the hospital and was found to have a right-sided pleural effusion in May 2022, there was pleural thickening and thoracentesis on 09/16/2020 was placed.  Cytology was negative, it was recommended that he have further work-up as CT imaging showed concerns for a left upper lobe nodule with adenopathy in the mediastinum and a lesion in the liver.  A PET scan on 10/02/2020 showed a 2.1 cm mass in the left upper lobe with an SUV of 11.5 which was noted to touch the pleural surface, there was hypermetabolic subcarinal adenopathy as well as bilateral paratracheal adenopathy.  There was diffuse metabolic activity within the atelectatic right lung and hypermetabolism also seen in the left and right adrenal glands.  He underwent bronchoscopy on 10/18/2019, and the cytology was nonspecific other than an atypical cells in the left upper lobe  brushing.  He is really been asymptomatic from his right-sided effusion as well, and saw Dr. [name] last week and is contacted by phone to discuss empirically treating the nodule, and in thinking that his adenopathy is reactive to what is driving the process in the right pleural space.     PREVIOUS RADIATION THERAPY: No   PAST MEDICAL HISTORY:  Past Medical History:  Diagnosis Date   Atrial fibrillation ([place])    BENIGN PROSTATIC HYPERTROPHY    Cataract    DIVERTICULOSIS, COLON    DM    not diabetic-has been off metformin for about 2 years as of 09/2020 pr daughter   GERD    Heart murmur    Hernia    HYPERCHOLESTEROLEMIA    HYPERTENSION    Moderate aortic stenosis 10/30/2011   echo 2013   Prostate cancer [facility])    s/p seed implant       PAST SURGICAL HISTORY: Past Surgical History:  Procedure Laterality Date   BRONCHIAL BIOPSY  10/17/2020   Procedure: BRONCHIAL BIOPSIES;  Surgeon: Garner Nash, DO;  Location: [place] [facility];  Service: Pulmonary;;   BRONCHIAL BRUSHINGS  10/17/2020   Procedure: BRONCHIAL BRUSHINGS;  Surgeon: Garner Nash, DO;  Location: [place] [facility];  Service: Pulmonary;;   BRONCHIAL NEEDLE ASPIRATION BIOPSY  10/17/2020   Procedure: BRONCHIAL NEEDLE ASPIRATION BIOPSIES;  Surgeon: Garner Nash, DO;  Location: [place] [facility];  Service: Pulmonary;;   BRONCHIAL WASHINGS  10/17/2020   Procedure: BRONCHIAL WASHINGS;  Surgeon: Garner Nash, DO;  Location: [place];  Service: Pulmonary;;   CATARACT EXTRACTION W/ INTRAOCULAR LENS IMPLANT  02/2013   R, then L 4 weeks later - [place]   FIDUCIAL MARKER PLACEMENT  10/17/2020   Procedure: FIDUCIAL MARKER PLACEMENT;  Surgeon: Garner Nash, DO;  Location: [place] [facility];  Service: Pulmonary;;   INGUINAL HERNIA REPAIR     Right   INGUINAL HERNIA REPAIR Left 07/15/2014   Procedure: LEFT INGUINAL HERNIA REPAIR WITH MESH;  Surgeon: Armandina Gemma, MD;  [facility];  Service: General;  Laterality: Left;   INSERTION  OF MESH Left 07/15/2014   Procedure: INSERTION OF MESH;  Surgeon: Armandina Gemma, MD;  [facility];  Service: General;  Laterality: Left;   RADIOACTIVE SEED IMPLANT     VIDEO BRONCHOSCOPY WITH ENDOBRONCHIAL NAVIGATION Left 10/17/2020   Procedure: VIDEO BRONCHOSCOPY WITH ENDOBRONCHIAL NAVIGATION;  Surgeon: Garner Nash, DO;  Location: [place];  Service: Pulmonary;  Laterality: Left;   VIDEO BRONCHOSCOPY WITH ENDOBRONCHIAL ULTRASOUND Bilateral 10/17/2020   Procedure: VIDEO BRONCHOSCOPY WITH ENDOBRONCHIAL ULTRASOUND;  Surgeon: Garner Nash, DO;  Location: [place];  Service: Pulmonary;  Laterality: Bilateral;     FAMILY HISTORY:  Family History  Problem Relation Age of Onset   Heart attack Paternal Uncle        in his 88s   Atrial fibrillation Daughter    Colon cancer Neg Hx      SOCIAL HISTORY:  reports that he quit smoking about 54 years ago. His smoking use included cigarettes. He has a 17.50 pack-year smoking history. He has never used smokeless tobacco. He reports that he does not drink alcohol and does not use drugs. The patient is widowed and lives in [place].    ALLERGIES: Lisinopril   MEDICATIONS:  Current Outpatient Medications  Medication Sig Dispense Refill   albuterol (VENTOLIN HFA) 108 (90 Base) MCG/ACT inhaler INHALE 1 TO 2 PUFFS BY MOUTH EVERY 6 HOURS AS NEEDED FOR WHEEZING FOR SHORTNESS OF BREATH 9 g 0   apixaban (ELIQUIS) 5 MG TABS tablet Take 1 tablet (5 mg total) by mouth 2 (two) times daily. 60 tablet 0   fluticasone (FLONASE) 50 MCG/ACT nasal spray Place 2 sprays into both nostrils daily. (Patient taking differently: Place 2 sprays into both nostrils daily as needed for allergies.) 16 g 6   furosemide (LASIX) 40 MG tablet Take 1 tablet (40 mg total) by mouth daily. 30 tablet 0   metoprolol tartrate (LOPRESSOR) 25 MG tablet Take 1 tablet (25 mg total) by mouth 2 (two) times daily. 60 tablet 0   Multiple Vitamin (MULTIVITAMIN WITH MINERALS) TABS  tablet Take 1 tablet by mouth in the morning.     Polyethylene Glycol 3350 (MIRALAX PO) Take 17 g by mouth. Daily if needed     senna-docusate (SENOKOT-S) 8.6-50 MG tablet Take 1 tablet by mouth at bedtime as needed for mild constipation or moderate constipation. 30 tablet 1   simvastatin (ZOCOR) 20 MG tablet Take 1 tablet (20 mg total) by mouth daily at 6 PM. 90 tablet 3   tamsulosin (FLOMAX) 0.4 MG CAPS capsule Take 0.4 mg by mouth in the morning.     No current facility-administered medications for this encounter.     REVIEW OF SYSTEMS: On review of systems, the patient reports that he is doing well overall. He reports being able to get out and mow his yard and work in his vegetable garden. He denies any chest pain, shortness of breath,  fevers, chills, night sweats, unintended weight changes. He  has a productive sometimes white foamy cough but no hemoptysis. No other complaints are verbalized.     PHYSICAL EXAM:  Wt Readings from Last 3 Encounters:  10/31/20 157 lb (71.2 kg)  10/25/20 160 lb (72.6 kg)  10/17/20 156 lb 15.5 oz (71.2 kg)    Unable to assess due to encounter type.    ECOG = 1  0 - Asymptomatic (Fully active, able to carry on all predisease activities without restriction)  1 - Symptomatic but completely ambulatory (Restricted in physically strenuous activity but ambulatory and able to carry out work of a light or sedentary nature. For example, light housework, office work)  2 - Symptomatic, <50% in bed during the day (Ambulatory and capable of all self care but unable to carry out any work activities. Up and about more than 50% of waking hours)  3 - Symptomatic, >50% in bed, but not bedbound (Capable of only limited self-care, confined to bed or chair 50% or more of waking hours)  4 - Bedbound (Completely disabled. Cannot carry on any self-care. Totally confined to bed or chair)  5 - Death   Eustace Pen MM, Creech RH, Tormey DC, et al. [phone]). "Toxicity and response  criteria of the [facility]". [place] Oncol. 5 (6): 649-55    LABORATORY DATA:  Lab Results  Component Value Date   WBC 5.4 10/05/2020   HGB 11.8 (L) 10/05/2020   HCT 36.4 (L) 10/05/2020   MCV 80.0 10/05/2020   PLT 231.0 10/05/2020   Lab Results  Component Value Date   NA 143 10/05/2020   K 3.4 (L) 10/05/2020   CL 101 10/05/2020   CO2 33 (H) 10/05/2020   Lab Results  Component Value Date   ALT 13 10/05/2020   AST 12 10/05/2020   ALKPHOS 57 10/05/2020   BILITOT 0.7 10/05/2020      RADIOGRAPHY: NM PET Image Initial (PI) Skull Base To Thigh  Result Date: 10/02/2020 CLINICAL DATA:  Initial treatment strategy for LEFT upper lobe pulmonary nodule. EXAM: NUCLEAR MEDICINE PET SKULL BASE TO THIGH TECHNIQUE: 7.5 mCi F-18 FDG was injected intravenously. Full-ring PET imaging was performed from the skull base to thigh after the radiotracer. CT data was obtained and used for attenuation correction and anatomic localization. Fasting blood glucose: 110 mg/dl COMPARISON:  None. FINDINGS: Mediastinal blood pool activity: SUV max 2.5 Liver activity: SUV max NA NECK: No hypermetabolic lymph nodes in the neck. Incidental CT findings: none CHEST: 21 mm hypermetabolic nodule in the medial LEFT upper lobe with SUV max 11.5. Nodule touches the pleural surface. No hypermetabolic supraclavicular adenopathy. Hypermetabolic subcarinal lymph node with SUV max equal 5.8 measures 10 mm. Small bilateral lower paratracheal lymph nodes have hypermetabolic activity. For example RIGHT lower paratracheal node with SUV max equal 3.9 on image 59 and measures approximately 6 mm short axis. There is diffuse metabolic activity within the atelectatic RIGHT lung. Moderate activity associated with the RIGHT lower lobe consolidation. No significant increased activity associated with the loculated RIGHT effusion with interspersed gas. Incidental CT findings: none ABDOMEN/PELVIS: Hypermetabolic activity  associated with the LEFT and RIGHT adrenal gland. The glands have low density consistent with benign adenomas. No hypermetabolic activity in the liver. No hypermetabolic abdominopelvic lymph nodes. Scattered activity in the small bowel colon without associated findings on CT. Incidental CT findings: Brachytherapy seeds in the prostate gland. SKELETON: No focal hypermetabolic activity to suggest skeletal metastasis. Incidental CT findings: none IMPRESSION: 1. Hypermetabolic LEFT upper lobe pulmonary nodule  most consistent with primary bronchogenic carcinoma. Nodule touches the mediastinal pleural surface. 2. Hypermetabolic subcarinal and lower paratracheal lymph nodes. Differential includes metastatic adenopathy versus reactive adenopathy related to the RIGHT lung chronic infection. 3. RIGHT lung empyema, lower lobe consolidation and diffuse atelectasis. There is moderate metabolic activity associated with these RIGHT lung findings; however, favored benign infectious or inflammatory. 4. Bilateral hypermetabolic adrenal adenomas. Electronically Signed   By: Suzy Bouchard M.D.   On: 10/02/2020 14:21   DG CHEST PORT 1 VIEW  Result Date: 10/17/2020 CLINICAL DATA:  Status post bronchoscopy EXAM: PORTABLE CHEST 1 VIEW COMPARISON:  09/28/2020 FINDINGS: Previously seen right sided hydropneumothorax has resolved in the interval. Fiducial marker is now noted in the left apex consistent with the known bronchoscopy and biopsy. No left-sided pneumothorax is seen. Chronic opacification in the right lung is noted secondary to a combination of pleural fluid and parenchymal opacity. IMPRESSION: No evidence of post biopsy pneumothorax on the left. Resolution of previously seen hydropneumothorax although persistent opacification in the right lung is noted. Electronically Signed   By: Inez Catalina M.D.   On: 10/17/2020 17:06   DG C-ARM BRONCHOSCOPY  Result Date: 10/17/2020 C-ARM BRONCHOSCOPY: Fluoroscopy was utilized by the  requesting physician.  No radiographic interpretation.       IMPRESSION/PLAN: 1. Putative Stage IA3, cT1cN0M0, NSCLC of the LUL. Dr. Lisbeth Renshaw discusses the clinical findings and reviews the nature of early stage lung disease. Dr. Lisbeth Renshaw discusses the PET scan as well as the clinical course. He outlines the limitations of not having tissue confirmation of this suspected cancer diagnosis, but that the risks to pursue tissue confirmation could be higher risk than the yield. Hence, he would favor treatment with stereotactic body radiotherapy (SBRT).  We discussed the risks, benefits, short, and long term effects of radiotherapy, as well as the curative intent, and the patient is interested in proceeding. Dr. Lisbeth Renshaw discusses the delivery and logistics of radiotherapy and anticipates a course of 3-5 fractions of radiotherapy to the LUL tumor. He will come in for simulation on Thursday afternoon at which time he will sign written consent to proceed. 2. Right pleural effusion and mediastinal adenopathy. In review of his case, the consensus is to proceed with surveillance of these sites, as the features were more reactive in nature. However, the patient and his daughter are aware that if these sites were malignant, his overall course could significantly change in outcome. We will follow him closely during and at the conclusion of his radiotherapy. We will also encourage him to keep following up with Dr. [name].    Given current concerns for patient exposure during the COVID-19 pandemic, this encounter was conducted via telephone.  The patient has provided two factor identification and has given verbal consent for this type of encounter and has been advised to only accept a meeting of this type in a secure network environment. The time spent during this encounter was 45 minutes including preparation, discussion, and coordination of the patient's care. The attendants for this meeting include Blenda Nicely, RN, Dr. Lisbeth Renshaw,  Adam Briggs  and Adam Briggs and his daughter Adam Briggs.  During the encounter,  Blenda Nicely, RN, Dr. Lisbeth Renshaw, and Adam Briggs were located at [facility] Radiation Oncology Department.  Adam Briggs was located at home with his daughter Adam Briggs.     The above documentation reflects my direct findings during this shared patient visit. Please see the separate note by Dr. Lisbeth Renshaw  on this date for the remainder of the patient's plan of care.    Carola Rhine, [facility]   **Disclaimer: This note was dictated with voice recognition software. Similar sounding words can inadvertently be transcribed and this note may contain transcription errors which may not have been corrected upon publication of note.**

## 2020-10-31 NOTE — Telephone Encounter (Signed)
Patients daughter called and is requesting that the following medications be sent to [place], [place]. She said that it is cheaper for the patient to get it through the mail. She said that [facility] Rossetti is has sent a fax with the information.   furosemide (LASIX) 40 MG tablet  metoprolol tartrate (LOPRESSOR) 25 MG tablet  apixaban (ELIQUIS) 5 MG TABS tablet  simvastatin (ZOCOR) 20 MG tablet

## 2020-11-01 MED ORDER — FUROSEMIDE 40 MG PO TABS: 40.0000 mg | ORAL_TABLET | Freq: Every day | ORAL | 3 refills | Status: DC

## 2020-11-01 MED ORDER — APIXABAN 5 MG PO TABS: 5.0000 mg | ORAL_TABLET | Freq: Two times a day (BID) | ORAL | 1 refills | Status: DC

## 2020-11-01 MED ORDER — SIMVASTATIN 20 MG PO TABS: 20.0000 mg | ORAL_TABLET | Freq: Every day | ORAL | 3 refills | Status: AC

## 2020-11-01 MED ORDER — METOPROLOL TARTRATE 25 MG PO TABS: 25.0000 mg | ORAL_TABLET | Freq: Two times a day (BID) | ORAL | 3 refills | Status: DC

## 2020-11-01 NOTE — Telephone Encounter (Signed)
Daughter, [name], notified the requested prescriptions sent to [facility].  Verb understanding & is thankful.

## 2020-11-01 NOTE — Addendum Note (Signed)
Addended by: [name] on: 11/01/2020 09:38 AM   Modules accepted: Orders

## 2020-11-02 ENCOUNTER — Other Ambulatory Visit: Payer: Self-pay

## 2020-11-02 ENCOUNTER — Ambulatory Visit
Admission: RE | Admit: 2020-11-02 | Discharge: 2020-11-02 | Disposition: A | Discharge status: Home / Self Care | Payer: Medicare Other | Source: Ambulatory Visit | Attending: Radiation Oncology | Admitting: Radiation Oncology

## 2020-11-02 DIAGNOSIS — C3412 Malignant neoplasm of upper lobe, left bronchus or lung: Secondary | ICD-10-CM | POA: Insufficient documentation

## 2020-11-02 DIAGNOSIS — Z510 Encounter for antineoplastic radiation therapy: Secondary | ICD-10-CM | POA: Insufficient documentation

## 2020-11-02 DIAGNOSIS — Z87891 Personal history of nicotine dependence: Secondary | ICD-10-CM | POA: Diagnosis not present

## 2020-11-02 NOTE — Progress Notes (Signed)
Cardiology Office Note   Date:  11/03/2020   ID:  Adam Briggs, DOB 20-Jun-1929, MRN [id number]  PCP:  [name], MD  Cardiologist:   None Referring:      No chief complaint on file.     History of Present Illness: Adam Briggs is a 87 y.o. male who presents for follow up of atrial fib.  I saw him last in 2015.  He was in the ED in April with SOB and pleural effusion.  He had atrial fib with RVR.  He was treated with Eliquis and rate control.        He is going to start radiation therapy for what they think is non-small cell lung cancer but they are not exactly sure.  He has some chronic compression of his right lung.  However, he is actually relatively asymptomatic.  He is not having any acute shortness of breath.  He is not requiring any oxygen.  He denies any PND or orthopnea.  He has not had any palpitations and would not know he was in fibrillation.  He has had no presyncope or syncope.  He tolerates anticoagulation.  He has had no chest pressure, neck or arm discomfort.  He has had a little ankle edema    Past Medical History:  Diagnosis Date   Atrial fibrillation ([place])    BENIGN PROSTATIC HYPERTROPHY    Cataract    DIVERTICULOSIS, COLON    DM    not diabetic-has been off metformin for about 2 years as of 09/2020 pr daughter   GERD    Heart murmur    Hernia    HYPERCHOLESTEROLEMIA    HYPERTENSION    Moderate aortic stenosis 10/30/2011   echo 2013   Prostate cancer [facility])    s/p seed implant    Past Surgical History:  Procedure Laterality Date   BRONCHIAL BIOPSY  10/17/2020   Procedure: BRONCHIAL BIOPSIES;  Surgeon: Garner Nash, DO;  Location: [place] [facility];  Service: Pulmonary;;   BRONCHIAL BRUSHINGS  10/17/2020   Procedure: BRONCHIAL BRUSHINGS;  Surgeon: Garner Nash, DO;  Location: [place];  Service: Pulmonary;;   BRONCHIAL NEEDLE ASPIRATION BIOPSY  10/17/2020   Procedure: BRONCHIAL NEEDLE ASPIRATION BIOPSIES;  Surgeon: Garner Nash, DO;  Location: [place] [facility];  Service: Pulmonary;;   BRONCHIAL WASHINGS  10/17/2020   Procedure: BRONCHIAL WASHINGS;  Surgeon: Garner Nash, DO;  Location: [place] [facility];  Service: Pulmonary;;   CATARACT EXTRACTION W/ INTRAOCULAR LENS IMPLANT  02/2013   R, then L 4 weeks later - [place]   FIDUCIAL MARKER PLACEMENT  10/17/2020   Procedure: FIDUCIAL MARKER PLACEMENT;  Surgeon: Garner Nash, DO;  Location: [place] [facility];  Service: Pulmonary;;   INGUINAL HERNIA REPAIR     Right   INGUINAL HERNIA REPAIR Left 07/15/2014   Procedure: LEFT INGUINAL HERNIA REPAIR WITH MESH;  Surgeon: Armandina Gemma, MD;  [facility];  Service: General;  Laterality: Left;   INSERTION OF MESH Left 07/15/2014   Procedure: INSERTION OF MESH;  Surgeon: Armandina Gemma, MD;  [facility];  Service: General;  Laterality: Left;   RADIOACTIVE SEED IMPLANT     VIDEO BRONCHOSCOPY WITH ENDOBRONCHIAL NAVIGATION Left 10/17/2020   Procedure: VIDEO BRONCHOSCOPY WITH ENDOBRONCHIAL NAVIGATION;  Surgeon: Garner Nash, DO;  Location: [place];  Service: Pulmonary;  Laterality: Left;   VIDEO BRONCHOSCOPY WITH ENDOBRONCHIAL ULTRASOUND Bilateral 10/17/2020   Procedure: VIDEO BRONCHOSCOPY WITH ENDOBRONCHIAL ULTRASOUND;  Surgeon: Garner Nash,  DO;  Location: [place] [facility];  Service: Pulmonary;  Laterality: Bilateral;     Current Outpatient Medications  Medication Sig Dispense Refill   albuterol (VENTOLIN HFA) 108 (90 Base) MCG/ACT inhaler INHALE 1 TO 2 PUFFS BY MOUTH EVERY 6 HOURS AS NEEDED FOR WHEEZING FOR SHORTNESS OF BREATH 9 g 0   apixaban (ELIQUIS) 5 MG TABS tablet Take 1 tablet (5 mg total) by mouth 2 (two) times daily. 180 tablet 1   fluticasone (FLONASE) 50 MCG/ACT nasal spray Place 2 sprays into both nostrils daily. (Patient taking differently: Place 2 sprays into both nostrils daily as needed for allergies.) 16 g 6   furosemide (LASIX) 40 MG tablet Take 1 tablet (40 mg total) by mouth daily. 90 tablet 3    metoprolol tartrate (LOPRESSOR) 25 MG tablet Take 1 tablet (25 mg total) by mouth 2 (two) times daily. 180 tablet 3   Multiple Vitamin (MULTIVITAMIN WITH MINERALS) TABS tablet Take 1 tablet by mouth in the morning.     Polyethylene Glycol 3350 (MIRALAX PO) Take 17 g by mouth. Daily if needed     senna-docusate (SENOKOT-S) 8.6-50 MG tablet Take 1 tablet by mouth at bedtime as needed for mild constipation or moderate constipation. 30 tablet 1   simvastatin (ZOCOR) 20 MG tablet Take 1 tablet (20 mg total) by mouth daily at 6 PM. 90 tablet 3   tamsulosin (FLOMAX) 0.4 MG CAPS capsule Take 0.4 mg by mouth in the morning.     No current facility-administered medications for this visit.    Allergies:   Lisinopril   ROS:  Please see the history of present illness.   Otherwise, review of systems are positive for none.   All other systems are reviewed and negative.    PHYSICAL EXAM: VS:  BP 124/78 (BP Location: Left Arm, Patient Position: Sitting, Cuff Size: Normal)   Pulse 99   Resp 18   Ht 5\' 10"  (1.778 m)   Wt 162 lb 9.6 oz (73.8 kg)   SpO2 93%   BMI 23.33 kg/m  , BMI Body mass index is 23.33 kg/m. GENERAL:  Well appearing NECK:  No jugular venous distention, waveform within normal limits, carotid upstroke brisk and symmetric, no bruits, no thyromegaly LUNGS: Decreased breath sounds to auscultation and percussion on the right base CHEST:  Unremarkable HEART:  PMI not displaced or sustained,S1 and S2 within normal limits, no S3, no clicks, no rubs, 3 out of 6 apical systolic murmur radiating slightly out aortic outflow tract, no diastolic murmurs, irregular  ABD:  Flat, positive bowel sounds normal in frequency in pitch, no bruits, no rebound, no guarding, no midline pulsatile mass, no hepatomegaly, no splenomegaly EXT:  2 plus pulses throughout, mild bilateral ankle edema, no cyanosis no clubbing   EKG:  EKG is not ordered today. NA   Recent Labs: 09/15/2020: B Natriuretic Peptide  238.0; Pro B Natriuretic peptide (BNP) 238.0 09/16/2020: Magnesium 2.0 09/18/2020: TSH 3.642 10/05/2020: ALT 13; BUN 27; Creatinine, Ser 1.16; Hemoglobin 11.8; Platelets 231.0; Potassium 3.4; Sodium 143    Lipid Panel    Component Value Date/Time   CHOL 117 09/19/2020 0031   TRIG 43 09/19/2020 0031   HDL 36 (L) 09/19/2020 0031   CHOLHDL 3.3 09/19/2020 0031   VLDL 9 09/19/2020 0031   LDLCALC 72 09/19/2020 0031   LDLCALC 94 11/26/2019 1549   LDLDIRECT 110.2 12/28/2007 1123      Wt Readings from Last 3 Encounters:  11/03/20 162 lb 9.6 oz (73.8 kg)  10/31/20 157 lb (71.2 kg)  10/25/20 160 lb (72.6 kg)      Other studies Reviewed: Additional studies/ records that were reviewed today include: Hospital records, echo, CXR. Review of the above records demonstrates:  Please see elsewhere in the note.     ASSESSMENT AND PLAN:   Atrial fib:  [name] has a CHA2DS2 - VASc score of 4.  He tolerates anticoagulation.  He has good rate control we talked about how to monitor this at home with a pulse oximeter.  At this point no change in therapy is indicated.   Bilateral pleural effusion:  He has been found to have a left upper lung nodule and is diagnostic with non small cell lung likely.  He did not require further chest tube.  He has some chronic consolidation and effusion but seems to be doing well with this.  He will continue to follow with radiation oncology.  He will continue to follow with pulmonary.     Aortic stenosis:    This was moderate.  I will follow up with a repeat echo.  I will plan this in 1 year and see him afterward.    Current medicines are reviewed at length with the patient today.  The patient does not have concerns regarding medicines.  The following changes have been made:  no change  Labs/ tests ordered today include:   Orders Placed This Encounter  Procedures   ECHOCARDIOGRAM COMPLETE      Disposition:   FU with me in one year.      Signed, Minus Breeding, MD  11/03/2020 10:27 AM    [facility]

## 2020-11-03 ENCOUNTER — Encounter: Payer: Self-pay | Admitting: Cardiology

## 2020-11-03 ENCOUNTER — Ambulatory Visit (INDEPENDENT_AMBULATORY_CARE_PROVIDER_SITE_OTHER): Payer: Medicare Other | Admitting: Cardiology

## 2020-11-03 VITALS — BP 124/78 | HR 99 | Resp 18 | Ht 70.0 in | Wt 162.6 lb

## 2020-11-03 DIAGNOSIS — I4819 Other persistent atrial fibrillation: Secondary | ICD-10-CM

## 2020-11-03 DIAGNOSIS — J90 Pleural effusion, not elsewhere classified: Secondary | ICD-10-CM

## 2020-11-03 DIAGNOSIS — I350 Nonrheumatic aortic (valve) stenosis: Secondary | ICD-10-CM

## 2020-11-03 NOTE — Patient Instructions (Addendum)
Medication Instructions:  Your physician recommends that you continue on your current medications as directed. Please refer to the Current Medication list given to you today.  *If you need a refill on your cardiac medications before your next appointment, please call your pharmacy*   Lab Work: None ordered.    Testing/Procedures: Your physician has requested that you have an echocardiogram. Echocardiography is a painless test that uses sound waves to create images of your heart. It provides your doctor with information about the size and shape of your heart and how well your heart's chambers and valves are working. This procedure takes approximately one hour. There are no restrictions for this procedure.    Follow-Up: At [facility], you and your health needs are our priority.  As part of our continuing mission to provide you with exceptional heart care, we have created designated Provider Care Teams.  These Care Teams include your primary Cardiologist (physician) and Advanced Practice Providers (APPs -  Physician Assistants and Nurse Practitioners) who all work together to provide you with the care you need, when you need it.  We recommend signing up for the patient portal called "MyChart".  Sign up information is provided on this After Visit Summary.  MyChart is used to connect with patients for Virtual Visits (Telemedicine).  Patients are able to view lab/test results, encounter notes, upcoming appointments, etc.  Non-urgent messages can be sent to your provider as well.   To learn more about what you can do with MyChart, go to [email or web address].    Your next appointment:   12 month(s). See Dr. Percival Spanish after you get your echocardiogram.   The format for your next appointment:   In Person  Provider:   Minus Breeding, MD

## 2020-11-11 DIAGNOSIS — Z510 Encounter for antineoplastic radiation therapy: Secondary | ICD-10-CM | POA: Diagnosis not present

## 2020-11-11 DIAGNOSIS — Z87891 Personal history of nicotine dependence: Secondary | ICD-10-CM | POA: Diagnosis not present

## 2020-11-11 DIAGNOSIS — C3412 Malignant neoplasm of upper lobe, left bronchus or lung: Secondary | ICD-10-CM | POA: Diagnosis not present

## 2020-11-13 ENCOUNTER — Ambulatory Visit
Admission: RE | Admit: 2020-11-13 | Discharge: 2020-11-13 | Disposition: A | Discharge status: Home / Self Care | Payer: Medicare Other | Source: Ambulatory Visit | Attending: Radiation Oncology | Admitting: Radiation Oncology

## 2020-11-13 ENCOUNTER — Other Ambulatory Visit: Payer: Self-pay

## 2020-11-13 DIAGNOSIS — Z510 Encounter for antineoplastic radiation therapy: Secondary | ICD-10-CM | POA: Diagnosis not present

## 2020-11-13 DIAGNOSIS — C3412 Malignant neoplasm of upper lobe, left bronchus or lung: Secondary | ICD-10-CM | POA: Diagnosis not present

## 2020-11-14 ENCOUNTER — Ambulatory Visit: Payer: Medicare Other

## 2020-11-15 ENCOUNTER — Other Ambulatory Visit: Payer: Self-pay

## 2020-11-15 ENCOUNTER — Ambulatory Visit
Admission: RE | Admit: 2020-11-15 | Discharge: 2020-11-15 | Disposition: A | Discharge status: Home / Self Care | Payer: Medicare Other | Source: Ambulatory Visit | Attending: Radiation Oncology | Admitting: Radiation Oncology

## 2020-11-15 DIAGNOSIS — C3412 Malignant neoplasm of upper lobe, left bronchus or lung: Secondary | ICD-10-CM | POA: Diagnosis not present

## 2020-11-15 DIAGNOSIS — Z510 Encounter for antineoplastic radiation therapy: Secondary | ICD-10-CM | POA: Diagnosis not present

## 2020-11-16 ENCOUNTER — Ambulatory Visit: Payer: Medicare Other

## 2020-11-16 LAB — FUNGUS CULTURE WITH STAIN

## 2020-11-16 LAB — FUNGAL ORGANISM REFLEX

## 2020-11-16 LAB — FUNGUS CULTURE RESULT

## 2020-11-17 ENCOUNTER — Ambulatory Visit
Admission: RE | Admit: 2020-11-17 | Discharge: 2020-11-17 | Disposition: A | Discharge status: Home / Self Care | Payer: Medicare Other | Source: Ambulatory Visit | Attending: Radiation Oncology | Admitting: Radiation Oncology

## 2020-11-17 DIAGNOSIS — Z510 Encounter for antineoplastic radiation therapy: Secondary | ICD-10-CM | POA: Diagnosis not present

## 2020-11-17 DIAGNOSIS — C3412 Malignant neoplasm of upper lobe, left bronchus or lung: Secondary | ICD-10-CM | POA: Insufficient documentation

## 2020-11-21 ENCOUNTER — Other Ambulatory Visit: Payer: Self-pay

## 2020-11-21 ENCOUNTER — Ambulatory Visit
Admission: RE | Admit: 2020-11-21 | Discharge: 2020-11-21 | Disposition: A | Discharge status: Home / Self Care | Payer: Medicare Other | Source: Ambulatory Visit | Attending: Radiation Oncology | Admitting: Radiation Oncology

## 2020-11-21 ENCOUNTER — Other Ambulatory Visit: Payer: Self-pay | Admitting: Internal Medicine

## 2020-11-21 DIAGNOSIS — C3412 Malignant neoplasm of upper lobe, left bronchus or lung: Secondary | ICD-10-CM | POA: Diagnosis not present

## 2020-11-21 DIAGNOSIS — Z510 Encounter for antineoplastic radiation therapy: Secondary | ICD-10-CM | POA: Diagnosis not present

## 2020-11-23 ENCOUNTER — Other Ambulatory Visit: Payer: Self-pay

## 2020-11-23 ENCOUNTER — Ambulatory Visit
Admission: RE | Admit: 2020-11-23 | Discharge: 2020-11-23 | Disposition: A | Discharge status: Home / Self Care | Payer: Medicare Other | Source: Ambulatory Visit | Attending: Radiation Oncology | Admitting: Radiation Oncology

## 2020-11-23 ENCOUNTER — Encounter: Payer: Self-pay | Admitting: Radiation Oncology

## 2020-11-23 DIAGNOSIS — C3412 Malignant neoplasm of upper lobe, left bronchus or lung: Secondary | ICD-10-CM | POA: Diagnosis not present

## 2020-11-23 DIAGNOSIS — Z510 Encounter for antineoplastic radiation therapy: Secondary | ICD-10-CM | POA: Diagnosis not present

## 2020-11-23 DIAGNOSIS — Z87891 Personal history of nicotine dependence: Secondary | ICD-10-CM | POA: Diagnosis not present

## 2020-11-27 ENCOUNTER — Encounter: Payer: Self-pay | Admitting: Internal Medicine

## 2020-11-27 ENCOUNTER — Ambulatory Visit (INDEPENDENT_AMBULATORY_CARE_PROVIDER_SITE_OTHER): Payer: Medicare Other

## 2020-11-27 ENCOUNTER — Other Ambulatory Visit: Payer: Self-pay

## 2020-11-27 ENCOUNTER — Ambulatory Visit (INDEPENDENT_AMBULATORY_CARE_PROVIDER_SITE_OTHER): Payer: Medicare Other | Admitting: Internal Medicine

## 2020-11-27 VITALS — BP 130/68 | HR 101 | Temp 98.4°F | Resp 18 | Ht 70.0 in | Wt 163.4 lb

## 2020-11-27 DIAGNOSIS — R0602 Shortness of breath: Secondary | ICD-10-CM

## 2020-11-27 DIAGNOSIS — I10 Essential (primary) hypertension: Secondary | ICD-10-CM

## 2020-11-27 DIAGNOSIS — C3412 Malignant neoplasm of upper lobe, left bronchus or lung: Secondary | ICD-10-CM | POA: Diagnosis not present

## 2020-11-27 DIAGNOSIS — I350 Nonrheumatic aortic (valve) stenosis: Secondary | ICD-10-CM

## 2020-11-27 DIAGNOSIS — J90 Pleural effusion, not elsewhere classified: Secondary | ICD-10-CM | POA: Diagnosis not present

## 2020-11-27 DIAGNOSIS — I4891 Unspecified atrial fibrillation: Secondary | ICD-10-CM | POA: Diagnosis not present

## 2020-11-27 DIAGNOSIS — M7989 Other specified soft tissue disorders: Secondary | ICD-10-CM | POA: Insufficient documentation

## 2020-11-27 LAB — CBC
HCT: 37.1 % — ABNORMAL LOW (ref 39.0–52.0)
Hemoglobin: 12 g/dL — ABNORMAL LOW (ref 13.0–17.0)
MCHC: 32.2 g/dL (ref 30.0–36.0)
MCV: 80.4 fl (ref 78.0–100.0)
Platelets: 202 10*3/uL (ref 150.0–400.0)
RBC: 4.61 Mil/uL (ref 4.22–5.81)
RDW: 16.7 % — ABNORMAL HIGH (ref 11.5–15.5)
WBC: 4.9 10*3/uL (ref 4.0–10.5)

## 2020-11-27 LAB — COMPREHENSIVE METABOLIC PANEL
ALT: 11 U/L (ref 0–53)
AST: 12 U/L (ref 0–37)
Albumin: 3.6 g/dL (ref 3.5–5.2)
Alkaline Phosphatase: 56 U/L (ref 39–117)
BUN: 26 mg/dL — ABNORMAL HIGH (ref 6–23)
CO2: 35 mEq/L — ABNORMAL HIGH (ref 19–32)
Calcium: 9 mg/dL (ref 8.4–10.5)
Chloride: 101 mEq/L (ref 96–112)
Creatinine, Ser: 1.03 mg/dL (ref 0.40–1.50)
GFR: 64.41 mL/min (ref >60.00)
Glucose, Bld: 117 mg/dL — ABNORMAL HIGH (ref 70–99)
Potassium: 3.9 mEq/L (ref 3.5–5.1)
Sodium: 143 mEq/L (ref 135–145)
Total Bilirubin: 0.6 mg/dL (ref 0.2–1.2)
Total Protein: 6.2 g/dL (ref 6.0–8.3)

## 2020-11-27 NOTE — Patient Instructions (Signed)
We will check the x-ray today and the labs.

## 2020-11-27 NOTE — Assessment & Plan Note (Signed)
Some worsening SOB not acutely. Checking CXR and CBC and CMP for cause. There is some worsening fluid in the legs which could mean right effusion could be worse. No hypoxia on exam today even after walking.

## 2020-11-27 NOTE — Assessment & Plan Note (Signed)
Checking CBC and CMP for change to renal, liver function or blood counts. Could be related to heart although recent ECHO fairly stable. Drinking with fluid restriction and low sodium. Takes lasix 40 mg daily and denies missing this and it is still causing urination.

## 2020-11-27 NOTE — Assessment & Plan Note (Deleted)
Recent radiation and checking CXR with worsening SOB for lung pneumonitis or infection.

## 2020-11-27 NOTE — Assessment & Plan Note (Signed)
Recent echo without change from years ago so this is likely not contributing to his SOB.

## 2020-11-27 NOTE — Assessment & Plan Note (Signed)
Rate controlled. Checking CBC as on eliquis and worsening SOB to check for anemia. No blood in stool noted.

## 2020-11-27 NOTE — Assessment & Plan Note (Signed)
Recent radiation and checking CXR with worsening SOB for lung pneumonitis or infection.

## 2020-11-27 NOTE — Progress Notes (Signed)
   Subjective:   Patient ID: Adam Briggs, male    DOB: 06-30-1929, 87 y.o.   MRN: [id number]  HPI The patient is an 87 YO man coming in for concerns about SOB (known right empyema, recent left lung radiation for stage 1 Adam Briggs, worsening over the last week or so). He is having more swelling in his feet. He is doing fluid restriction with good control. Urine is fairly dark per daughter. Denies blood in stool. Denies GI pain. Denies constipation or diarrhea takes stool softener daily. Denies new joint pains or falls. Denies confusion or headaches. Not much activity. Eating well.   Review of Systems  Constitutional:  Positive for fatigue. Negative for activity change, appetite change, diaphoresis and fever.  HENT: Negative.    Eyes: Negative.   Respiratory:  Positive for shortness of breath. Negative for cough and chest tightness.   Cardiovascular:  Positive for leg swelling. Negative for chest pain and palpitations.  Gastrointestinal:  Negative for abdominal distention, abdominal pain, constipation, diarrhea, nausea and vomiting.  Musculoskeletal: Negative.   Skin: Negative.   Neurological: Negative.   Psychiatric/Behavioral: Negative.     Objective:  Physical Exam Constitutional:      Appearance: He is well-developed.  HENT:     Head: Normocephalic and atraumatic.  Cardiovascular:     Rate and Rhythm: Normal rate. Rhythm irregular.  Pulmonary:     Effort: Pulmonary effort is normal. No respiratory distress.     Breath sounds: Rales present. No wheezing.     Comments: Right lung without clear sounds and some rales, left lung sounds clear Chest:     Chest wall: No tenderness.  Abdominal:     General: Bowel sounds are normal. There is no distension.     Palpations: Abdomen is soft.     Tenderness: There is no abdominal tenderness. There is no rebound.  Musculoskeletal:     Cervical back: Normal range of motion.     Right lower leg: Edema present.     Left lower leg: Edema present.      Comments: 2+ edema pitting to the knees bilaterally  Skin:    General: Skin is warm and dry.  Neurological:     Mental Status: He is alert and oriented to person, place, and time.     Coordination: Coordination normal.     Comments: Slow but steady gait    Vitals:   11/27/20 1049  BP: 130/68  Pulse: (!) 101  Resp: 18  Temp: 98.4 F (36.9 C)  TempSrc: Oral  SpO2: 92%  Weight: 163 lb 6.4 oz (74.1 kg)  Height: 5\' 10"  (1.778 m)    This visit occurred during the SARS-CoV-2 public health emergency.  Safety protocols were in place, including screening questions prior to the visit, additional usage of staff PPE, and extensive cleaning of exam room while observing appropriate contact time as indicated for disinfecting solutions.   Assessment & Plan:

## 2020-11-30 LAB — ACID FAST CULTURE WITH REFLEXED SENSITIVITIES (MYCOBACTERIA): Acid Fast Culture: NEGATIVE

## 2020-12-12 ENCOUNTER — Other Ambulatory Visit: Payer: Self-pay | Admitting: Radiation Oncology

## 2020-12-12 DIAGNOSIS — C3412 Malignant neoplasm of upper lobe, left bronchus or lung: Secondary | ICD-10-CM

## 2020-12-12 NOTE — Progress Notes (Signed)
                                                                                                                                                             Patient Name: Adam Briggs MRN: [id number] DOB: 1930-04-11 Referring Physician: June Leap Date of Service: 11/23/2020 [facility], [place]                                                        End Of Treatment Note  Diagnoses: C34.12-Malignant neoplasm of upper lobe, left bronchus or lung  Cancer Staging:  Putative Stage IA3, cT1cN0M0, NSCLC of the LUL  Intent: Curative  Radiation Treatment Dates: 11/13/2020 through 11/23/2020 Site Technique Total Dose (Gy) Dose per Fx (Gy) Completed Fx Beam Energies  Lung, Left: Lung_Lt IMRT 60/60 12 5/5 6XFFF   Narrative: The patient tolerated radiation therapy relatively well.   Plan: The patient will receive a call in about one month from the radiation oncology department. He will be due for a CT scan in August 2022 to determine response and then at 6 month intervals thereafter for 5 years per NCCN Guidelines.  ________________________________________________    Carola Rhine, PAC

## 2020-12-24 DIAGNOSIS — Z20822 Contact with and (suspected) exposure to covid-19: Secondary | ICD-10-CM | POA: Diagnosis not present

## 2021-01-01 ENCOUNTER — Ambulatory Visit
Admission: RE | Admit: 2021-01-01 | Discharge: 2021-01-01 | Disposition: A | Discharge status: Home / Self Care | Payer: Medicare Other | Source: Ambulatory Visit | Attending: Radiation Oncology | Admitting: Radiation Oncology

## 2021-01-01 DIAGNOSIS — C3412 Malignant neoplasm of upper lobe, left bronchus or lung: Secondary | ICD-10-CM | POA: Insufficient documentation

## 2021-01-01 NOTE — Progress Notes (Signed)
  Radiation Oncology         [phone] ________________________________  Name: Adam Briggs MRN: [id number]  Date of Service: 01/01/2021  DOB: 1929-08-29  Post Treatment Telephone Note  Diagnosis:   Putative Stage IA3, cT1cN0M0, NSCLC of the LUL  Interval Since Last Radiation:  6 weeks   11/13/2020 through 11/23/2020 Site Technique Total Dose (Gy) Dose per Fx (Gy) Completed Fx Beam Energies  Lung, Left: Lung_Lt IMRT 60/60 12 5/5 6XFFF    Narrative:  The patient was contacted today for routine follow-up. During treatment he did very well with radiotherapy and did not have significant desquamation. He reports he has had some increased shortness of breath since radiation over the last 2 weeks gradually. He is coughing up white foamy mucous but denies hemoptysis and denies fevers. He had a complex course and had pleural effusion and pneumothorax prior to his treatment, and we discussed the need for a CXR.  Impression/Plan: 1. Putative Stage IA3, cT1cN0M0, NSCLC of the LUL. The patient has been doing well since completion of radiotherapy. We discussed that we would follow up with a CXR and may need pulmonary's help in managing his symptoms. Otheriwise we will plan to proceed with CT scan next month or delay a bit further and review results by phone. If stable we will continue in 6 months' time per NCCN guidelines. He is aware to call back if he develops a fever or to go to the ED if he has acute SOB. I don't think he has the clinical triad of symptoms to think this is pneumonitis especially this seems too early from a clinical perspective.     Carola Rhine, PAC

## 2021-01-02 ENCOUNTER — Ambulatory Visit (HOSPITAL_COMMUNITY)
Admission: RE | Admit: 2021-01-02 | Discharge: 2021-01-02 | Disposition: A | Discharge status: Home / Self Care | Payer: Medicare Other | Source: Ambulatory Visit | Attending: Radiation Oncology | Admitting: Radiation Oncology

## 2021-01-02 ENCOUNTER — Other Ambulatory Visit: Payer: Self-pay

## 2021-01-02 DIAGNOSIS — R0600 Dyspnea, unspecified: Secondary | ICD-10-CM | POA: Diagnosis not present

## 2021-01-02 DIAGNOSIS — J90 Pleural effusion, not elsewhere classified: Secondary | ICD-10-CM | POA: Diagnosis not present

## 2021-01-02 DIAGNOSIS — C3412 Malignant neoplasm of upper lobe, left bronchus or lung: Secondary | ICD-10-CM | POA: Insufficient documentation

## 2021-01-02 DIAGNOSIS — J9811 Atelectasis: Secondary | ICD-10-CM | POA: Diagnosis not present

## 2021-01-03 ENCOUNTER — Telehealth: Payer: Self-pay | Admitting: Radiation Oncology

## 2021-01-03 ENCOUNTER — Telehealth: Payer: Self-pay | Admitting: Pulmonary Disease

## 2021-01-03 NOTE — Telephone Encounter (Signed)
I called the patient's daughter and we discussed the results of his CXR which shows a large loculate right effusion. This has cytologically not been felt to be related to his history of putative stage I left lung cancer and had SBRT for this. I discussed his case and CXR with Dr. [name] and he is going to get the patient in to be seen in the office for thoracentesis. I've discussed this with the patient and his daughter Adam Briggs. His pulse ox is 91-94% on room air, and HR on home monitor was 110-120 bpm. He is not having chest pain today but has had a few episodes of this, but is quite short of breath now.

## 2021-01-03 NOTE — Telephone Encounter (Signed)
BI is wanting to get the pt scheduled for Friday at ENDO.  Lauren will call [facility] tomorrow.  Will route this to her.

## 2021-01-03 NOTE — Telephone Encounter (Signed)
Pts daughter stated that he has been very SOB and stated that he has fluid built up around his lungs on the right side and was wanting to know if it can be drained stated Dr. Michelene Heady office was supposed to reach out to Dr. [name]. Pls regard; [phone]

## 2021-01-03 NOTE — Telephone Encounter (Signed)
BI pt is scheduled to see you on 09/08.    Daughter stated that he has been [facility] and the fluid is built back up.   She is requesting that he be drained again.  Does he need to go to the ER for this?  Please advise. Thanks

## 2021-01-04 NOTE — Telephone Encounter (Signed)
Spoke with [name] (pt's daughter per [facility]). Gave update that Ander Purpura is currently working with Dr. [name] and [facility]O to get pt scheduled for Friday. [name] stated understanding. Sherry also instructed per Dr. [name],  Icard, Octavio Graves, DO  Elie Confer, CMA 20 hours ago (1:11 PM)    If he is getting worse I think the best place would be the ER. Our team is present at [facility] or [place]          if pt worsens in anyway to go ER. [name] stated understanding as well.  Routing this message to Dr. [name] and Ander Purpura as Juluis Rainier.

## 2021-01-04 NOTE — Telephone Encounter (Signed)
[place], pt's daughter, is calling for an update.  [phone].

## 2021-01-04 NOTE — Telephone Encounter (Signed)
Called and spoke with patient's daughter.  Let her know tomorrow 01/04/21 at [place] for procedure at 3pm to arrive at 2:40.  Patient will hold Eliquis tonight and tomorrow morning   Nothing further needed at this time.

## 2021-01-05 ENCOUNTER — Encounter (HOSPITAL_COMMUNITY): Payer: Self-pay | Admitting: Internal Medicine

## 2021-01-05 ENCOUNTER — Ambulatory Visit (HOSPITAL_COMMUNITY)
Admission: RE | Admit: 2021-01-05 | Discharge: 2021-01-05 | Disposition: A | Discharge status: Home / Self Care | Payer: Medicare Other | Attending: Internal Medicine | Admitting: Internal Medicine

## 2021-01-05 ENCOUNTER — Encounter (HOSPITAL_COMMUNITY)
Admission: RE | Disposition: A | Discharge status: Home / Self Care | Payer: Self-pay | Source: Home / Self Care | Attending: Internal Medicine

## 2021-01-05 ENCOUNTER — Ambulatory Visit (HOSPITAL_COMMUNITY): Payer: Medicare Other

## 2021-01-05 DIAGNOSIS — J90 Pleural effusion, not elsewhere classified: Secondary | ICD-10-CM | POA: Diagnosis not present

## 2021-01-05 DIAGNOSIS — R0602 Shortness of breath: Secondary | ICD-10-CM | POA: Diagnosis not present

## 2021-01-05 DIAGNOSIS — I4891 Unspecified atrial fibrillation: Secondary | ICD-10-CM | POA: Insufficient documentation

## 2021-01-05 DIAGNOSIS — Z8249 Family history of ischemic heart disease and other diseases of the circulatory system: Secondary | ICD-10-CM | POA: Insufficient documentation

## 2021-01-05 DIAGNOSIS — Z87891 Personal history of nicotine dependence: Secondary | ICD-10-CM | POA: Insufficient documentation

## 2021-01-05 DIAGNOSIS — Z9889 Other specified postprocedural states: Secondary | ICD-10-CM

## 2021-01-05 DIAGNOSIS — Z79899 Other long term (current) drug therapy: Secondary | ICD-10-CM | POA: Insufficient documentation

## 2021-01-05 DIAGNOSIS — Z8639 Personal history of other endocrine, nutritional and metabolic disease: Secondary | ICD-10-CM | POA: Diagnosis not present

## 2021-01-05 DIAGNOSIS — Z8546 Personal history of malignant neoplasm of prostate: Secondary | ICD-10-CM | POA: Diagnosis not present

## 2021-01-05 DIAGNOSIS — Z888 Allergy status to other drugs, medicaments and biological substances status: Secondary | ICD-10-CM | POA: Diagnosis not present

## 2021-01-05 DIAGNOSIS — Z8679 Personal history of other diseases of the circulatory system: Secondary | ICD-10-CM | POA: Insufficient documentation

## 2021-01-05 DIAGNOSIS — I350 Nonrheumatic aortic (valve) stenosis: Secondary | ICD-10-CM | POA: Diagnosis not present

## 2021-01-05 DIAGNOSIS — Z85118 Personal history of other malignant neoplasm of bronchus and lung: Secondary | ICD-10-CM | POA: Diagnosis not present

## 2021-01-05 DIAGNOSIS — Z7901 Long term (current) use of anticoagulants: Secondary | ICD-10-CM | POA: Diagnosis not present

## 2021-01-05 DIAGNOSIS — R911 Solitary pulmonary nodule: Secondary | ICD-10-CM | POA: Diagnosis not present

## 2021-01-05 DIAGNOSIS — R091 Pleurisy: Secondary | ICD-10-CM | POA: Diagnosis not present

## 2021-01-05 HISTORY — PX: THORACENTESIS: SHX235

## 2021-01-05 SURGERY — THORACENTESIS
Anesthesia: LOCAL

## 2021-01-05 MED ORDER — LIDOCAINE HCL (PF) 1 % IJ SOLN
INTRAMUSCULAR | Status: AC
  Filled 2021-01-05: qty 30

## 2021-01-05 NOTE — Discharge Instructions (Signed)
CALL CLINIC Monday AND LET us KNOW IF YOU FEEL (A) BETTER, (B) WORSE, OR (C) THE SAME IN TERMS OF YOUR BREATHING.  RESUME ELIQUIS TOMORROW  OKAY TO BATHE AS USUAL

## 2021-01-05 NOTE — Procedures (Signed)
Thoracentesis  Procedure Note  Adam Briggs  [id number]  [birth year]  Date:01/05/21  Time:5:18 PM   Provider Performing:Jerran Tappan C Tamala Julian   Procedure: Thoracentesis with imaging guidance ([id number])  Indication(s) Pleural Effusion  Consent Risks of the procedure as well as the alternatives and risks of each were explained to the patient and/or caregiver.  Consent for the procedure was obtained and is signed in the bedside chart  Anesthesia Topical only with 1% lidocaine    Time Out Verified patient identification, verified procedure, site/side was marked, verified correct patient position, special equipment/implants available, medications/allergies/relevant history reviewed, required imaging and test results available.   Sterile Technique Maximal sterile technique including full sterile barrier drape, hand hygiene, sterile gown, sterile gloves, mask, hair covering, sterile ultrasound probe cover (if used).  Procedure Description Ultrasound was used to identify appropriate pleural anatomy for placement and overlying skin marked.  Area of drainage cleaned and draped in sterile fashion. Lidocaine was used to anesthetize the skin and subcutaneous tissue.  500 cc's of amber appearing fluid was drained from the right pleural space. Catheter then removed and bandaid applied to site.   Complications/Tolerance None; patient tolerated the procedure well. Chest X-ray is ordered to confirm no post-procedural complication.   EBL Minimal   Specimen(s) Pleural fluid

## 2021-01-06 NOTE — H&P (Signed)
Here for thoracentesis for recurrent efffusion. PMH, meds, imaging, symptoms reviewed. Exam shows elderly man with decreased breath sounds on left. A: Recurrent symptomatic effusion P: Diagnostic/therapeutic thoracentesis, patient agrees to proceed.  Erskine Emery MD PCCM

## 2021-01-08 ENCOUNTER — Inpatient Hospital Stay (HOSPITAL_COMMUNITY)
Admission: EM | Admit: 2021-01-08 | Discharge: 2021-01-16 | DRG: 186 | Disposition: A | Discharge status: Home Health | Payer: Medicare Other | Attending: Internal Medicine | Admitting: Internal Medicine

## 2021-01-08 ENCOUNTER — Telehealth: Payer: Self-pay | Admitting: Pulmonary Disease

## 2021-01-08 ENCOUNTER — Emergency Department (HOSPITAL_COMMUNITY): Payer: Medicare Other

## 2021-01-08 ENCOUNTER — Observation Stay (HOSPITAL_COMMUNITY): Payer: Medicare Other

## 2021-01-08 ENCOUNTER — Other Ambulatory Visit: Payer: Self-pay

## 2021-01-08 ENCOUNTER — Encounter (HOSPITAL_COMMUNITY): Payer: Self-pay | Admitting: Internal Medicine

## 2021-01-08 DIAGNOSIS — I4891 Unspecified atrial fibrillation: Secondary | ICD-10-CM | POA: Diagnosis not present

## 2021-01-08 DIAGNOSIS — I4819 Other persistent atrial fibrillation: Secondary | ICD-10-CM | POA: Diagnosis present

## 2021-01-08 DIAGNOSIS — Z8546 Personal history of malignant neoplasm of prostate: Secondary | ICD-10-CM

## 2021-01-08 DIAGNOSIS — M7989 Other specified soft tissue disorders: Secondary | ICD-10-CM | POA: Diagnosis not present

## 2021-01-08 DIAGNOSIS — C3412 Malignant neoplasm of upper lobe, left bronchus or lung: Secondary | ICD-10-CM | POA: Diagnosis present

## 2021-01-08 DIAGNOSIS — Z87891 Personal history of nicotine dependence: Secondary | ICD-10-CM

## 2021-01-08 DIAGNOSIS — I350 Nonrheumatic aortic (valve) stenosis: Secondary | ICD-10-CM | POA: Diagnosis present

## 2021-01-08 DIAGNOSIS — J869 Pyothorax without fistula: Secondary | ICD-10-CM | POA: Diagnosis not present

## 2021-01-08 DIAGNOSIS — J948 Other specified pleural conditions: Secondary | ICD-10-CM | POA: Diagnosis not present

## 2021-01-08 DIAGNOSIS — I10 Essential (primary) hypertension: Secondary | ICD-10-CM | POA: Diagnosis present

## 2021-01-08 DIAGNOSIS — Z8249 Family history of ischemic heart disease and other diseases of the circulatory system: Secondary | ICD-10-CM

## 2021-01-08 DIAGNOSIS — J90 Pleural effusion, not elsewhere classified: Secondary | ICD-10-CM | POA: Diagnosis not present

## 2021-01-08 DIAGNOSIS — K769 Liver disease, unspecified: Secondary | ICD-10-CM

## 2021-01-08 DIAGNOSIS — J9383 Other pneumothorax: Secondary | ICD-10-CM | POA: Diagnosis not present

## 2021-01-08 DIAGNOSIS — N400 Enlarged prostate without lower urinary tract symptoms: Secondary | ICD-10-CM | POA: Diagnosis present

## 2021-01-08 DIAGNOSIS — J9811 Atelectasis: Secondary | ICD-10-CM | POA: Diagnosis not present

## 2021-01-08 DIAGNOSIS — E7800 Pure hypercholesterolemia, unspecified: Secondary | ICD-10-CM | POA: Diagnosis present

## 2021-01-08 DIAGNOSIS — Z20822 Contact with and (suspected) exposure to covid-19: Secondary | ICD-10-CM | POA: Diagnosis present

## 2021-01-08 DIAGNOSIS — L8990 Pressure ulcer of unspecified site, unspecified stage: Secondary | ICD-10-CM | POA: Insufficient documentation

## 2021-01-08 DIAGNOSIS — J9601 Acute respiratory failure with hypoxia: Secondary | ICD-10-CM | POA: Diagnosis not present

## 2021-01-08 DIAGNOSIS — I2510 Atherosclerotic heart disease of native coronary artery without angina pectoris: Secondary | ICD-10-CM | POA: Diagnosis present

## 2021-01-08 DIAGNOSIS — Z888 Allergy status to other drugs, medicaments and biological substances status: Secondary | ICD-10-CM

## 2021-01-08 DIAGNOSIS — R062 Wheezing: Secondary | ICD-10-CM

## 2021-01-08 DIAGNOSIS — K7460 Unspecified cirrhosis of liver: Secondary | ICD-10-CM | POA: Diagnosis not present

## 2021-01-08 DIAGNOSIS — C3490 Malignant neoplasm of unspecified part of unspecified bronchus or lung: Secondary | ICD-10-CM | POA: Diagnosis not present

## 2021-01-08 DIAGNOSIS — I700 Atherosclerosis of aorta: Secondary | ICD-10-CM | POA: Diagnosis not present

## 2021-01-08 DIAGNOSIS — J939 Pneumothorax, unspecified: Secondary | ICD-10-CM | POA: Diagnosis not present

## 2021-01-08 DIAGNOSIS — R0602 Shortness of breath: Secondary | ICD-10-CM | POA: Diagnosis not present

## 2021-01-08 DIAGNOSIS — E119 Type 2 diabetes mellitus without complications: Secondary | ICD-10-CM | POA: Diagnosis present

## 2021-01-08 DIAGNOSIS — R0600 Dyspnea, unspecified: Secondary | ICD-10-CM

## 2021-01-08 DIAGNOSIS — K219 Gastro-esophageal reflux disease without esophagitis: Secondary | ICD-10-CM | POA: Diagnosis present

## 2021-01-08 DIAGNOSIS — Z79899 Other long term (current) drug therapy: Secondary | ICD-10-CM

## 2021-01-08 DIAGNOSIS — Z923 Personal history of irradiation: Secondary | ICD-10-CM

## 2021-01-08 DIAGNOSIS — Z9889 Other specified postprocedural states: Secondary | ICD-10-CM

## 2021-01-08 DIAGNOSIS — K7469 Other cirrhosis of liver: Secondary | ICD-10-CM | POA: Diagnosis present

## 2021-01-08 DIAGNOSIS — Z7901 Long term (current) use of anticoagulants: Secondary | ICD-10-CM

## 2021-01-08 HISTORY — DX: Malignant neoplasm of unspecified part of unspecified bronchus or lung: C34.90

## 2021-01-08 HISTORY — DX: Pleural effusion, not elsewhere classified: J90

## 2021-01-08 LAB — RESP PANEL BY RT-PCR (FLU A&B, COVID) ARPGX2
Influenza A by PCR: NEGATIVE
Influenza B by PCR: NEGATIVE
SARS Coronavirus 2 by RT PCR: NEGATIVE

## 2021-01-08 LAB — COMPREHENSIVE METABOLIC PANEL
ALT: 16 U/L (ref 0–44)
AST: 15 U/L (ref 15–41)
Albumin: 3.4 g/dL — ABNORMAL LOW (ref 3.5–5.0)
Alkaline Phosphatase: 57 U/L (ref 38–126)
Anion gap: 11 (ref 5–15)
BUN: 27 mg/dL — ABNORMAL HIGH (ref 8–23)
CO2: 30 mmol/L (ref 22–32)
Calcium: 8.8 mg/dL — ABNORMAL LOW (ref 8.9–10.3)
Chloride: 99 mmol/L (ref 98–111)
Creatinine, Ser: 0.91 mg/dL (ref 0.61–1.24)
GFR, Estimated: >60 mL/min (ref >60)
Glucose, Bld: 152 mg/dL — ABNORMAL HIGH (ref 70–99)
Potassium: 3.7 mmol/L (ref 3.5–5.1)
Sodium: 140 mmol/L (ref 135–145)
Total Bilirubin: 0.8 mg/dL (ref 0.3–1.2)
Total Protein: 6.7 g/dL (ref 6.5–8.1)

## 2021-01-08 LAB — CBC WITH DIFFERENTIAL/PLATELET
Abs Immature Granulocytes: 0.01 10*3/uL (ref 0.00–0.07)
Basophils Absolute: 0 10*3/uL (ref 0.0–0.1)
Basophils Relative: 0 %
Eosinophils Absolute: 0 10*3/uL (ref 0.0–0.5)
Eosinophils Relative: 1 %
HCT: 39.5 % (ref 39.0–52.0)
Hemoglobin: 12.1 g/dL — ABNORMAL LOW (ref 13.0–17.0)
Immature Granulocytes: 0 %
Lymphocytes Relative: 6 %
Lymphs Abs: 0.3 10*3/uL — ABNORMAL LOW (ref 0.7–4.0)
MCH: 25.3 pg — ABNORMAL LOW (ref 26.0–34.0)
MCHC: 30.6 g/dL (ref 30.0–36.0)
MCV: 82.6 fL (ref 80.0–100.0)
Monocytes Absolute: 0.6 10*3/uL (ref 0.1–1.0)
Monocytes Relative: 12 %
Neutro Abs: 3.9 10*3/uL (ref 1.7–7.7)
Neutrophils Relative %: 81 %
Platelets: 199 10*3/uL (ref 150–400)
RBC: 4.78 MIL/uL (ref 4.22–5.81)
RDW: 15.6 % — ABNORMAL HIGH (ref 11.5–15.5)
WBC: 4.9 10*3/uL (ref 4.0–10.5)
nRBC: 0 % (ref 0.0–0.2)

## 2021-01-08 LAB — CYTOLOGY - NON PAP

## 2021-01-08 LAB — BRAIN NATRIURETIC PEPTIDE: B Natriuretic Peptide: 412 pg/mL — ABNORMAL HIGH (ref 0.0–100.0)

## 2021-01-08 MED ORDER — SENNOSIDES-DOCUSATE SODIUM 8.6-50 MG PO TABS
1.0000 | ORAL_TABLET | Freq: Every day | ORAL | Status: DC
  Administered 2021-01-08 – 2021-01-15 (×7): 1 via ORAL
  Filled 2021-01-08 (×7): qty 1

## 2021-01-08 MED ORDER — POLYETHYLENE GLYCOL 3350 17 G PO PACK
17.0000 g | PACK | Freq: Every day | ORAL | Status: DC | PRN
  Administered 2021-01-09 – 2021-01-11 (×2): 17 g via ORAL
  Filled 2021-01-08 (×2): qty 1

## 2021-01-08 MED ORDER — ALBUTEROL SULFATE HFA 108 (90 BASE) MCG/ACT IN AERS
2.0000 | INHALATION_SPRAY | RESPIRATORY_TRACT | Status: DC | PRN
  Administered 2021-01-08: 2 via RESPIRATORY_TRACT
  Filled 2021-01-08: qty 6.7

## 2021-01-08 MED ORDER — ACETAMINOPHEN 650 MG RE SUPP: 650.0000 mg | Freq: Four times a day (QID) | RECTAL | Status: DC | PRN

## 2021-01-08 MED ORDER — FLUTICASONE PROPIONATE 50 MCG/ACT NA SUSP
2.0000 | Freq: Every day | NASAL | Status: DC | PRN
  Filled 2021-01-08: qty 16

## 2021-01-08 MED ORDER — LEVALBUTEROL HCL 0.63 MG/3ML IN NEBU
0.6300 mg | INHALATION_SOLUTION | Freq: Four times a day (QID) | RESPIRATORY_TRACT | Status: DC | PRN
  Administered 2021-01-08 – 2021-01-09 (×2): 0.63 mg via RESPIRATORY_TRACT
  Filled 2021-01-08 (×2): qty 3

## 2021-01-08 MED ORDER — ONDANSETRON HCL 4 MG/2ML IJ SOLN: 4.0000 mg | Freq: Four times a day (QID) | INTRAMUSCULAR | Status: DC | PRN

## 2021-01-08 MED ORDER — TAMSULOSIN HCL 0.4 MG PO CAPS
0.4000 mg | ORAL_CAPSULE | Freq: Every morning | ORAL | Status: DC
  Administered 2021-01-09 – 2021-01-16 (×8): 0.4 mg via ORAL
  Filled 2021-01-08 (×8): qty 1

## 2021-01-08 MED ORDER — FUROSEMIDE 10 MG/ML IJ SOLN
20.0000 mg | Freq: Every day | INTRAMUSCULAR | Status: DC
  Administered 2021-01-08 – 2021-01-11 (×4): 20 mg via INTRAVENOUS
  Filled 2021-01-08 (×4): qty 2

## 2021-01-08 MED ORDER — LEVALBUTEROL HCL 0.63 MG/3ML IN NEBU: 0.6300 mg | INHALATION_SOLUTION | Freq: Four times a day (QID) | RESPIRATORY_TRACT | Status: DC

## 2021-01-08 MED ORDER — SODIUM CHLORIDE 0.9% FLUSH
3.0000 mL | Freq: Two times a day (BID) | INTRAVENOUS | Status: DC
  Administered 2021-01-08: 3 mL via INTRAVENOUS

## 2021-01-08 MED ORDER — METOPROLOL TARTRATE 25 MG PO TABS
25.0000 mg | ORAL_TABLET | Freq: Two times a day (BID) | ORAL | Status: DC
  Administered 2021-01-08 – 2021-01-16 (×16): 25 mg via ORAL
  Filled 2021-01-08 (×16): qty 1

## 2021-01-08 MED ORDER — SODIUM CHLORIDE 0.9 % IV SOLN: 250.0000 mL | INTRAVENOUS | Status: DC | PRN

## 2021-01-08 MED ORDER — ONDANSETRON HCL 4 MG PO TABS: 4.0000 mg | ORAL_TABLET | Freq: Four times a day (QID) | ORAL | Status: DC | PRN

## 2021-01-08 MED ORDER — SODIUM CHLORIDE 0.9% FLUSH: 3.0000 mL | INTRAVENOUS | Status: DC | PRN

## 2021-01-08 MED ORDER — ACETAMINOPHEN 325 MG PO TABS
650.0000 mg | ORAL_TABLET | Freq: Four times a day (QID) | ORAL | Status: DC | PRN
  Filled 2021-01-08: qty 2

## 2021-01-08 MED ORDER — SODIUM CHLORIDE 0.9% FLUSH
3.0000 mL | Freq: Two times a day (BID) | INTRAVENOUS | Status: DC
  Administered 2021-01-09 – 2021-01-16 (×15): 3 mL via INTRAVENOUS

## 2021-01-08 MED ORDER — ALBUTEROL SULFATE (2.5 MG/3ML) 0.083% IN NEBU: 3.0000 mL | INHALATION_SOLUTION | RESPIRATORY_TRACT | Status: DC | PRN

## 2021-01-08 MED ORDER — SODIUM CHLORIDE 0.9 % IV SOLN: INTRAVENOUS | Status: DC

## 2021-01-08 MED ORDER — SIMVASTATIN 10 MG PO TABS
20.0000 mg | ORAL_TABLET | Freq: Every day | ORAL | Status: DC
  Administered 2021-01-08 – 2021-01-15 (×8): 20 mg via ORAL
  Filled 2021-01-08 (×8): qty 2

## 2021-01-08 NOTE — Telephone Encounter (Signed)
Contacted bed placement. [facility] does not have any available beds, however [facility] does.   I have spoken to patient's daughter, Sherry(DPR) who is agreeable with [facility].  Dr. [name], please advise on level of care? Thanks

## 2021-01-08 NOTE — Consult Note (Signed)
NAME:  Adam Briggs, MRN:  [id number], DOB:  12-22-1929, LOS: 0 ADMISSION DATE:  01/08/2021, CONSULTATION DATE:  01/08/2021  REFERRING MD:  Dr Sarajane Jews, CHIEF COMPLAINT:  Recurrent right pleural effusion  Primary pulmonologist Dr. June Leap   History of Present Illness: -Review of the medical chart   87 year old gentleman former smoker 17 pack smoking history quit in 1968.  Has past medical history of acid reflux, diabetes, hypertension hyperlipidemia and prostate cancer.,  Eliquis for atrial fibrillation admitted in May 2022 with a loculated right-sided pleural effusion with pleural thickening.  Underwent pigtail catheter placement after thoracentesis end of April 2022.  Effusion at this time showed an LDH of 134 and borderline exudate.  After the procedures he had ex vacuo pneumothorax but this pleural effusion was nondiagnostic he was also found to have a left-sided upper lobe nodule with mediastinal adenopathy.  In the middle of May 2022 he had a PET scan with a hypermetabolic left upper lobe lung mass with an SUV of 16.6 and hypermetabolic subcarinal nodes.  Underwent bronchoscopy end of May 2022.  He had atypical cells but otherwise nondiagnostic.  He was referred for empiric radiation SBRT and plans for follow-up of the right sided loculated hydropneumothorax.  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 Lisbeth Renshaw in radiation oncology mid June 2020 and plan for radiotherapy 3-5 fractions to the left upper lobe tumor and with surveillance of the right pleural effusion and mediastinal adenopathy.  He completed radiation therapy 11/23/2020.  He followed up with radiation oncology on 01/01/2021.  Part of this visit he had a chest x-ray.  Chest x-ray showed worsening of the large right-sided pleural effusion.  He also is reporting increased dyspnea.  He was referred to pulmonary.  On 01/05/2021 he underwent thoracentesis by pulmonary 500  cc of amber-colored fluid was drained.  ?  Results sent to cytology/chemistry [currently not available in chart]  However on the day of admission 01/08/2021 wife called back saying is complaining of cough with white sputum and wheezing and worsening shortness of breath and pulse ox dropping to 84% with exertion 92% at rest.  Tachycardic at 135.  Admitted by the hospitalist with chest x-ray [personally visualized] showing worsening effusion pleural effusion on the right side and therefore pulmonary consulted  Daughter is frustrated by the recurrent nature of the pleural effusion and also the idiopathic nondiagnostic nature of the pleural effusion.  She is frustrated that no specific etiology has been found.  She is frustrated that it is recurring.  She is frustrated that only 5 cc was drained and after that it was dry.  She is wondering about transfer to either [facility] or [facility].  She believes patient is quite functional and is much younger biologically than stated age.  She is also reporting edema of the feet  Pertinent  Medical History     has a past medical history of Atrial fibrillation ([place]), BENIGN PROSTATIC HYPERTROPHY, Cataract, DIVERTICULOSIS, COLON, DM, GERD, Heart murmur, Hernia, HYPERCHOLESTEROLEMIA, HYPERTENSION, Lung cancer (HCC), Moderate aortic stenosis (10/30/2011), Prostate cancer ([place]), and Recurrent right pleural effusion.   reports that he quit smoking about 54 years ago. His smoking use included cigarettes. He has a 17.50 pack-year smoking history. He has never used smokeless tobacco.  Past Surgical History:  Procedure Laterality Date   BRONCHIAL BIOPSY  10/17/2020   Procedure: BRONCHIAL BIOPSIES;  Surgeon: Garner Nash, DO;  Location: [place] [facility];  Service: Pulmonary;;   BRONCHIAL BRUSHINGS  10/17/2020   Procedure: BRONCHIAL BRUSHINGS;  Surgeon: Garner Nash, DO;  Location: [place] [facility];  Service: Pulmonary;;   BRONCHIAL NEEDLE ASPIRATION BIOPSY  10/17/2020    Procedure: BRONCHIAL NEEDLE ASPIRATION BIOPSIES;  Surgeon: Garner Nash, DO;  Location: [place] [facility];  Service: Pulmonary;;   BRONCHIAL WASHINGS  10/17/2020   Procedure: BRONCHIAL WASHINGS;  Surgeon: Garner Nash, DO;  Location: [place] [facility];  Service: Pulmonary;;   CATARACT EXTRACTION W/ INTRAOCULAR LENS IMPLANT  02/2013   R, then L 4 weeks later - [place]   FIDUCIAL MARKER PLACEMENT  10/17/2020   Procedure: FIDUCIAL MARKER PLACEMENT;  Surgeon: Garner Nash, DO;  Location: [place] [facility];  Service: Pulmonary;;   INGUINAL HERNIA REPAIR     Right   INGUINAL HERNIA REPAIR Left 07/15/2014   Procedure: LEFT INGUINAL HERNIA REPAIR WITH MESH;  Surgeon: Armandina Gemma, MD;  [facility];  Service: General;  Laterality: Left;   INSERTION OF MESH Left 07/15/2014   Procedure: INSERTION OF MESH;  Surgeon: Armandina Gemma, MD;  [facility];  Service: General;  Laterality: Left;   RADIOACTIVE SEED IMPLANT     THORACENTESIS N/A 01/05/2021   Procedure: THORACENTESIS;  Surgeon: Candee Furbish, MD;  Location[facility];  Service: Pulmonary;  Laterality: N/A;   VIDEO BRONCHOSCOPY WITH ENDOBRONCHIAL NAVIGATION Left 10/17/2020   Procedure: VIDEO BRONCHOSCOPY WITH ENDOBRONCHIAL NAVIGATION;  Surgeon: Garner Nash, DO;  Location: [place];  Service: Pulmonary;  Laterality: Left;   VIDEO BRONCHOSCOPY WITH ENDOBRONCHIAL ULTRASOUND Bilateral 10/17/2020   Procedure: VIDEO BRONCHOSCOPY WITH ENDOBRONCHIAL ULTRASOUND;  Surgeon: Garner Nash, DO;  Location: [place];  Service: Pulmonary;  Laterality: Bilateral;    Allergies  Allergen Reactions   Lisinopril Rash    Immunization History  Administered Date(s) Administered   Fluad Quad(high Dose 65+) 03/05/2019, 04/07/2020   Influenza Split 03/23/2012   Influenza Whole 02/17/2009, 03/26/2010   Influenza, High Dose Seasonal PF 04/16/2013, 03/22/2014, 01/12/2015, 02/10/2017, 03/20/2018   Influenza,inj,Quad PF,6+ Mos 01/11/2016   PFIZER  Comirnaty(Gray Top)Covid-19 Tri-Sucrose Vaccine 02/18/2020   PFIZER(Purple Top)SARS-COV-2 Vaccination 06/05/2019, 06/26/2019   Pneumococcal Conjugate-13 01/12/2015   Pneumococcal Polysaccharide-23 05/23/2003   Td 10/14/2012    Family History  Problem Relation Age of Onset   Heart attack Paternal Uncle        in his 39s   Atrial fibrillation Daughter    Colon cancer Neg Hx      Current Facility-Administered Medications:    fluticasone (FLONASE) 50 MCG/ACT nasal spray 2 spray, 2 spray, Each Nare, Daily PRN, Samuella Cota, MD   metoprolol tartrate (LOPRESSOR) tablet 25 mg, 25 mg, Oral, BID, Samuella Cota, MD   polyethylene glycol (MIRALAX / GLYCOLAX) packet 17 g, 17 g, Oral, Daily PRN, Samuella Cota, MD   senna-docusate (Senokot-S) tablet 1 tablet, 1 tablet, Oral, QHS, Samuella Cota, MD   simvastatin (ZOCOR) tablet 20 mg, 20 mg, Oral, q1800, Samuella Cota, MD   [START ON 01/09/2021] tamsulosin [facility]) capsule 0.4 mg, 0.4 mg, Oral, q AM, Samuella Cota, MD   Significant Hospital Events: Including procedures, antibiotic start and stop dates in addition to other pertinent events   01/08/2021: Admit  Interim History / Subjective:  01/08/2021: Evaluated by pulmonary on bed 1507 at [facility]  Objective   Blood pressure (!) 151/92, pulse 92, temperature 99.1 F (37.3 C), temperature source Oral, resp. rate (!) 22, height 5\' 10"  (1.778 m),  weight 72.6 kg, SpO2 97 %.        Intake/Output Summary (Last 24 hours) at 01/08/2021 1744 Last data filed at 01/08/2021 1700 Gross per 24 hour  Intake 39 ml  Output --  Net 39 ml   Filed Weights   01/08/21 1629  Weight: 72.6 kg    Examination: General: Pleasant male.  Looks younger than stated age.  Thin HENT: Oxygen on.  No elevated JVP or palpable neck nodes Lungs: Diminished movement on the right chest.  Very mild tachypnea.  Dullness and reduced air entry on the right Cardiovascular: Regular rate and  rhythm Abdomen: Soft nontender no organomegaly Extremities: Bilateral 1-2+ pitting pedal edema present [daughter frustrated by edema Neuro: Alert and oriented x3.  Moves all 4 extremities GU: Not examined  Resolved Hospital Problem list   X  Assessment & Plan:  Recurrent right pleural effusion -since April/May 2022.  Borderline exudate in April 2022 with nondiagnostic cytology and associated mediastinal adenopathy and stage Ia left upper lobe putative early in onset small cell lung cancer status post radiation  Plan - Get CT scan of the chest without contrast -> depending on the morphology have discussions about thoracentesis versus Pleurx catheter versus VATS pleurodesis  -Get right upper quadrant ultrasound to rule out cirrhosis as potential etiology for right pleural effusion - Get duplex lower extremity because of bilateral pedal edema - Start 24-hour urine protein collection because of right pleural effusion  Based on the results -> if patient/family is not satisfied with our evaluation then can consider transfer of care to [facility] or [facility] either as inpatient [place] bed availability and can be challenging] versus outpatient referral      Best Practice (right click and "Reselect all SmartList Selections" daily)   According to the hospitalist ATTESTATION & SIGNATURE    SIGNATURE    Dr. Brand Males, M.D., F.C.C.P,  Pulmonary and Critical Care Medicine Staff Physician, [place] Director - Interstitial Lung Disease  Program  Pulmonary [place] [facility] [place], [place], [address]  NPI Number:  NPI #[id number]  Pager: [phone], If no answer  -> Check AMION or Try [phone] Telephone (clinical office): [phone] Telephone (research): [phone]  6:36 PM 01/08/2021     LABS    PULMONARY No results for input(s): PHART, PCO2ART, PO2ART, HCO3, TCO2, O2SAT in the  last 168 hours.  Invalid input(s): PCO2, PO2  CBC Recent Labs  Lab 01/08/21 1256  HGB 12.1*  HCT 39.5  WBC 4.9  PLT 199    COAGULATION No results for input(s): INR in the last 168 hours.  CARDIAC  No results for input(s): TROPONINI in the last 168 hours. No results for input(s): PROBNP in the last 168 hours.   CHEMISTRY Recent Labs  Lab 01/08/21 1256  NA 140  K 3.7  CL 99  CO2 30  GLUCOSE 152*  BUN 27*  CREATININE 0.91  CALCIUM 8.8*   Estimated Creatinine Clearance: 56.5 mL/min (by C-G formula based on SCr of 0.91 mg/dL).   LIVER Recent Labs  Lab 01/08/21 1256  AST 15  ALT 16  ALKPHOS 57  BILITOT 0.8  PROT 6.7  ALBUMIN 3.4*     INFECTIOUS No results for input(s): LATICACIDVEN, PROCALCITON in the last 168 hours.   ENDOCRINE CBG (last 3)  No results for input(s): GLUCAP in the last 72 hours.       IMAGING x48h  -  image(s) personally visualized  -   highlighted in bold DG Chest 2 View  Result Date: 01/08/2021 CLINICAL DATA:  Increased shortness of breath EXAM: CHEST - 2 VIEW COMPARISON:  Chest x-ray dated January 05, 2021 FINDINGS: Visualized cardiac and mediastinal contours are unchanged. Increased size of moderate to large loculated right pleural effusion. Persistent right lung consolidation. Small left pleural effusion. No pneumothorax. IMPRESSION: Increased size of moderate to large loculated right pleural effusion when compared with most recent prior x-ray. Increased size of small left pleural effusion. Electronically Signed   By: Yetta Glassman M.D.   On: 01/08/2021 13:56

## 2021-01-08 NOTE — ED Provider Notes (Signed)
[facility] DEPT Provider Note   CSN: [id number] Arrival date & time: 01/08/21  1210     History Chief Complaint  Patient presents with   Shortness of Breath    Adam Briggs is a 87 y.o. male.  87 year old male presents with increasing dyspnea.  Denies any anginal type pain.  Does have chronic lower extremity edema which she says is unchanged.  He is chronically on Eliquis.  Had thoracentesis 3 days ago and has had increasing dyspnea on exertion time several days.  No fever or chills.  Slight cough without hemoptysis.  Called her physician and told to come here.      Past Medical History:  Diagnosis Date   Atrial fibrillation ([place])    BENIGN PROSTATIC HYPERTROPHY    Cataract    DIVERTICULOSIS, COLON    DM    not diabetic-has been off metformin for about 2 years as of 09/2020 pr daughter   GERD    Heart murmur    Hernia    HYPERCHOLESTEROLEMIA    HYPERTENSION    Moderate aortic stenosis 10/30/2011   echo 2013   Prostate cancer [facility])    s/p seed implant    Patient Active Problem List   Diagnosis Date Noted   Leg swelling 11/27/2020   Malignant neoplasm of upper lobe of left lung ([place]) 10/31/2020   Pleural effusion    Atrial fibrillation (H[facility])    S/P thoracentesis    SOB (shortness of breath) 09/15/2020   Pleural effusion, right 09/15/2020   Atrial fibrillation, new onset ([place]) 09/15/2020   Wheezing 04/24/2019   Routine general medical examination at a health care facility 06/23/2014   Inguinal hernia 05/05/2014   Moderate aortic stenosis 10/30/2011   Abnormal ECG 09/24/2010   Type 2 diabetes mellitus with hyperlipidemia ([place]) 03/26/2010   Hyperlipidemia associated with type 2 diabetes mellitus ([place]) 12/28/2007   Essential hypertension 02/07/2007    Past Surgical History:  Procedure Laterality Date   BRONCHIAL BIOPSY  10/17/2020   Procedure: BRONCHIAL BIOPSIES;  Surgeon: Garner Nash, DO;  Location: [place];  Service:  Pulmonary;;   BRONCHIAL BRUSHINGS  10/17/2020   Procedure: BRONCHIAL BRUSHINGS;  Surgeon: Garner Nash, DO;  Location: [place];  Service: Pulmonary;;   BRONCHIAL NEEDLE ASPIRATION BIOPSY  10/17/2020   Procedure: BRONCHIAL NEEDLE ASPIRATION BIOPSIES;  Surgeon: Garner Nash, DO;  Location: [place] [facility]OSCOPY;  Service: Pulmonary;;   BRONCHIAL WASHINGS  10/17/2020   Procedure: BRONCHIAL WASHINGS;  Surgeon: Garner Nash, DO;  Location: [place] [facility]OSCOPY;  Service: Pulmonary;;   CATARACT EXTRACTION W/ INTRAOCULAR LENS IMPLANT  02/2013   R, then L 4 weeks later - [place]   FIDUCIAL MARKER PLACEMENT  10/17/2020   Procedure: FIDUCIAL MARKER PLACEMENT;  Surgeon: Garner Nash, DO;  Location: [place] [facility];  Service: Pulmonary;;   INGUINAL HERNIA REPAIR     Right   INGUINAL HERNIA REPAIR Left 07/15/2014   Procedure: LEFT INGUINAL HERNIA REPAIR WITH MESH;  Surgeon: Armandina Gemma, MD;  [facility];  Service: General;  Laterality: Left;   INSERTION OF MESH Left 07/15/2014   Procedure: INSERTION OF MESH;  Surgeon: Armandina Gemma, MD;  [facility];  Service: General;  Laterality: Left;   RADIOACTIVE SEED IMPLANT     THORACENTESIS N/A 01/05/2021   Procedure: THORACENTESIS;  Surgeon: Candee Furbish, MD;  [facility];  Service: Pulmonary;  Laterality: N/A;   VIDEO BRONCHOSCOPY WITH ENDOBRONCHIAL NAVIGATION Left 10/17/2020   Procedure: VIDEO  BRONCHOSCOPY WITH ENDOBRONCHIAL NAVIGATION;  Surgeon: Garner Nash, DO;  Location: [place];  Service: Pulmonary;  Laterality: Left;   VIDEO BRONCHOSCOPY WITH ENDOBRONCHIAL ULTRASOUND Bilateral 10/17/2020   Procedure: VIDEO BRONCHOSCOPY WITH ENDOBRONCHIAL ULTRASOUND;  Surgeon: Garner Nash, DO;  Location: [place];  Service: Pulmonary;  Laterality: Bilateral;       Family History  Problem Relation Age of Onset   Heart attack Paternal Uncle        in his 33s   Atrial fibrillation Daughter    Colon cancer Neg Hx     Social History    Tobacco Use   Smoking status: Former    Packs/day: 0.50    Years: 35.00    Pack years: 17.50    Types: Cigarettes    Quit date: 10/30/1966    Years since quitting: 54.2   Smokeless tobacco: Never  Vaping Use   Vaping Use: Never used  Substance Use Topics   Alcohol use: No   Drug use: No    Home Medications Prior to Admission medications   Medication Sig Start Date End Date Taking? Authorizing Provider  albuterol (VENTOLIN HFA) 108 (90 Base) MCG/ACT inhaler INHALE 1 TO 2 PUFFS BY MOUTH EVERY 6 HOURS AS NEEDED FOR WHEEZING OR SHORTNESS OF BREATH Patient taking differently: Inhale 1-2 puffs into the lungs every 6 (six) hours as needed for wheezing or shortness of breath. 11/21/20  Yes [name], MD  apixaban (ELIQUIS) 5 MG TABS tablet Take 1 tablet (5 mg total) by mouth 2 (two) times daily. 11/01/20  Yes [name], MD  fluticasone [facility]) 50 MCG/ACT nasal spray Place 2 sprays into both nostrils daily. Patient taking differently: Place 2 sprays into both nostrils daily as needed for allergies. 08/04/18  Yes [name], MD  furosemide (LASIX) 40 MG tablet Take 1 tablet (40 mg total) by mouth daily. 11/01/20  Yes [name], MD  metoprolol tartrate (LOPRESSOR) 25 MG tablet Take 1 tablet (25 mg total) by mouth 2 (two) times daily. 11/01/20  Yes [name], MD  Multiple Vitamin (MULTIVITAMIN WITH MINERALS) TABS tablet Take 1 tablet by mouth in the morning. Centrum Silver for Men 50+   Yes [provider]  Polyethylene Glycol 3350 (MIRALAX PO) Take 17 g by mouth daily as needed (constipation.).   Yes [provider]  senna-docusate (SENOKOT-S) 8.6-50 MG tablet Take 1 tablet by mouth at bedtime as needed for mild constipation or moderate constipation. Patient taking differently: Take 1 tablet by mouth at bedtime. 09/28/20  Yes Amin, Jeanella Flattery, MD  simvastatin (ZOCOR) 20 MG tablet Take 1 tablet (20 mg total) by mouth daily  at 6 PM. 11/01/20  Yes [name], MD  tamsulosin (FLOMAX) 0.4 MG CAPS capsule Take 0.4 mg by mouth in the morning. 07/21/20  Yes [provider]    Allergies    Lisinopril  Review of Systems   Review of Systems  All other systems reviewed and are negative.  Physical Exam Updated Vital Signs Pulse 99   SpO2 93%   Physical Exam Vitals and nursing note reviewed.  Constitutional:      General: He is not in acute distress.    Appearance: Normal appearance. He is well-developed. He is not toxic-appearing.  HENT:     Head: Normocephalic and atraumatic.  Eyes:     General: Lids are normal.     Conjunctiva/sclera: Conjunctivae normal.     Pupils: Pupils are equal, round, and  reactive to light.  Neck:     Thyroid: No thyroid mass.     Trachea: No tracheal deviation.  Cardiovascular:     Rate and Rhythm: Normal rate and regular rhythm.     Heart sounds: Normal heart sounds. No murmur heard.   No gallop.  Pulmonary:     Effort: Pulmonary effort is normal. Tachypnea present. No respiratory distress.     Breath sounds: No stridor. Examination of the right-lower field reveals decreased breath sounds. Examination of the left-lower field reveals decreased breath sounds. Decreased breath sounds present. No wheezing, rhonchi or rales.  Abdominal:     General: There is no distension.     Palpations: Abdomen is soft.     Tenderness: There is no abdominal tenderness. There is no rebound.  Musculoskeletal:        General: No tenderness. Normal range of motion.     Cervical back: Normal range of motion and neck supple.  Skin:    General: Skin is warm and dry.     Findings: No abrasion or rash.  Neurological:     Mental Status: He is alert and oriented to person, place, and time. Mental status is at baseline.     GCS: GCS eye subscore is 4. GCS verbal subscore is 5. GCS motor subscore is 6.     Cranial Nerves: Cranial nerves are intact. No cranial nerve deficit.      Sensory: No sensory deficit.     Motor: Motor function is intact.  Psychiatric:        Attention and Perception: Attention normal.        Speech: Speech normal.        Behavior: Behavior normal.    ED Results / Procedures / Treatments   Labs (all labs ordered are listed, but only abnormal results are displayed) Labs Reviewed  RESP PANEL BY RT-PCR (FLU A&B, COVID) ARPGX2  CBC WITH DIFFERENTIAL/PLATELET  COMPREHENSIVE METABOLIC PANEL  BRAIN NATRIURETIC PEPTIDE    EKG None  Radiology No results found.  Procedures Procedures   Medications Ordered in ED Medications  albuterol (VENTOLIN HFA) 108 (90 Base) MCG/ACT inhaler 2 puff (2 puffs Inhalation Given 01/08/21 1233)  0.9 %  sodium chloride infusion (has no administration in time range)    ED Course  I have reviewed the triage vital signs and the nursing notes.  Pertinent labs & imaging results that were available during my care of the patient were reviewed by me and considered in my medical decision making (see chart for details).    MDM Rules/Calculators/A&P                          Chest x-ray results noted.  Discussed with Dr. Chase Caller who is on-call for critical care.  Recommends hospitalist admission Final Clinical Impression(s) / ED Diagnoses Final diagnoses:  None    Rx / DC Orders ED Discharge Orders     None        Lacretia Leigh, MD 01/08/21 1434

## 2021-01-08 NOTE — H&P (Signed)
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  Admitted for acute hypoxic respiratory failure secondary to recurrent increasing loculated pleural effusion.  Symptoms worsened over the weekend with increasing shortness of breath and hypoxia into the 80s, even from simple bending over.  No specific alleviating factors except sitting.  Also reports lower extremity edema over the last 3 weeks or so.  Patient has had pleural effusion drained twice in the past.  ED Course: Treated with oxygen and bronchodilator  Review of Systems:  Negative for fever, changes to his vision, sore throat, rash, new muscle aches, chest pain, dysuria, bleeding, nausea, vomiting  Positive for fullness in the right ear  PMH Left upper lobe lung cancer status post XRT  Atrial fibrillation Moderate aortic stenosis  Prostate cancer Previous diagnoses of hypertension and diabetes mellitus type 2, off medications Remainder reviewed in Epic  PSH Bronchoscopy Inguinal hernia repair Radioactive seed implant prostate Remainder reviewed in Epic  Family history includes: Daughter with atrial fibrillation Remainder reviewed in Epic  Social History Quit smoking in the 60s.  No alcohol or drugs.  Widowed.  Daughter takes care of him.  Allergies Lisinopril   Medications Current Outpatient Medications  Medication Instructions   albuterol (VENTOLIN HFA) 108 (90 Base) MCG/ACT inhaler INHALE 1 TO 2 PUFFS BY MOUTH EVERY 6 HOURS AS NEEDED FOR WHEEZING OR SHORTNESS OF BREATH   apixaban (ELIQUIS) 5 mg, Oral, 2 times daily   fluticasone (FLONASE) 50 MCG/ACT nasal spray 2 sprays,  Each Nare, Daily   furosemide (LASIX) 40 mg, Oral, Daily   metoprolol tartrate (LOPRESSOR) 25 mg, Oral, 2 times daily   Multiple Vitamin (MULTIVITAMIN WITH MINERALS) TABS tablet 1 tablet, Oral, Every morning, Centrum Silver for Men 50+   Polyethylene Glycol 3350 (MIRALAX PO) 17 g, Oral, Daily PRN   senna-docusate (SENOKOT-S) 8.6-50 MG tablet 1 tablet, Oral, At bedtime PRN   simvastatin (ZOCOR) 20 mg, Oral, Daily-1800   tamsulosin (FLOMAX) 0.4 mg, Oral, Every morning    Physicial Exam   Vitals:  Afebrile, 99.1, 23, 109, 131/92, 97% on 2 L  Constitutional:   Appears calm, mildly uncomfortable, nontoxic Eyes:  pupils and irises appear normal Normal lids  ENMT:  Slightly hard of hearing Lips appear normal Tongue appears unremarkable Right ear with large amount of cerumen and small erythematous irritated area, no drainage noted. Neck:  neck appears normal, no masses Possible thyromegaly Respiratory:  Clear to auscultation on the left with no wheezes rales or rhonchi noted.  Very poor breath sounds on the right. Respiratory effort mildly increased even just sitting in bed.  On 2 L nasal cannula. Cardiovascular:  RRR, no rub or gallop.  2/6 holosystolic murmur right upper sternal border. 2+ bilateral LE extremity edema   Abdomen:  Abdomen appears normal; no tenderness or masses No hernia noted Musculoskeletal:  RUE, LUE, RLE, LLE   Grossly unremarkable Skin:  No rashes, lesions, ulcers noted palpation of skin: no induration or nodules Psychiatric:  Mental status Mood, affect appropriate judgment and insight appear intact   I have personally reviewed following labs and imaging studies  Labs:  CMP, CBC unremarkable BNP 412 SARS-CoV-2 negative  Imaging studies:  Chest x-ray increasing right-sided loculated  pleural effusion independently reviewed  Medical tests:  EKG independently reviewed: atrial fibrillation, rate-controlled, VR 90, no acute  changes   ASSESSMENT/PLAN  * Loculated right lateral pneumothorax -- Recurrent, etiology unclear, status post 2 thoracenteses most recent 8/19. -- Causing acute hypoxic respiratory failure, worsening on imaging. -- Nontoxic at this point.  Nonurgent pulmonary consultation.  Acute hypoxemic respiratory failure (HCC) -- Secondary to pleural effusion.  Plan per pulmonology.  Continue supplemental oxygen.  Moderate aortic stenosis -- Likely stable. See comment on echo below. Not clearly contributing  Leg swelling --etiology unclear, albumin decent. Echo in May AoV was difficult to visualize and WMA could not be assessed. TEE suggested in past to visualize aortic valve --Diuresis  Malignant neoplasm of upper lobe of left lung (HCC) --s/p XRT  Atrial fibrillation (HCC) --asymptomatic. Hold apixaban until after procedure. --continue metoprolol  DVT prophylaxis: SCDs, anticipate procedure Code Status: Full, discussed w/ patient and daughter Family Communication: daughter at bedside, very helpful and knowledgeable    Consults called: Pulmonology     Time spent: 50 minutes  Murray Hodgkins, MD  [facility] Direct contact: see [email or web address]  7PM-7AM contact night coverage as below   Check the care team in [facility] and look for a) attending/consulting [place] provider listed and b) the [facility] team listed Log into [email or web address] and use [place]'s universal password to access. If you do not have the password, please contact the hospital operator. Locate the [facility] provider you are looking for under [facility] and page to a number that you can be directly reached. If you still have difficulty reaching the provider, please page the [facility] (Director on Call) for the Hospitalists listed on amion for assistance.  Severity of Illness: The appropriate patient status for this patient is OBSERVATION. Observation status is judged to be reasonable and necessary in order to provide the required  intensity of service to ensure the patient's safety. The patient's presenting symptoms, physical exam findings, and initial radiographic and laboratory data in the context of their medical condition is felt to place them at decreased risk for further clinical deterioration. Furthermore, it is anticipated that the patient will be medically stable for discharge from the hospital within 2 midnights of admission. The following factors support the patient status of observation.   " The patient's presenting symptoms include SOB. " The physical exam findings include hypoxic, poor air movement on right. " The initial radiographic and laboratory data are enlarging right pleural effusion.  Status is: Observation  The patient remains OBS appropriate and will d/c before 2 midnights.  Dispo: The patient is from: Home              Anticipated d/c is to: Home              Patient currently is not medically stable to d/c.   Difficult to place patient No  01/08/2021, 5:54 PM  Principal Problem:   Loculated right lateral pneumothorax Active Problems:   Acute hypoxemic respiratory failure (HCC)   Recurrent right pleural effusion   Moderate aortic stenosis   Atrial fibrillation (HCC)   Malignant neoplasm of upper lobe of left lung (HCC)   Leg swelling

## 2021-01-08 NOTE — Assessment & Plan Note (Signed)
--  asymptomatic. Hold apixaban until after procedure. --continue metoprolol

## 2021-01-08 NOTE — ED Notes (Signed)
Called 5 east to check on nurse assignment, asked to call back in 10 minutes will call back

## 2021-01-08 NOTE — Telephone Encounter (Signed)
Thoracentesis on 01/05/21.  Spoke to patient's daughter, Adam Briggs(DPR) who stated that she was instructed to contact our office today with update after thoracentesis. Adam Briggs stated that patient's breathing has worsened over the weekend. C/o prod cough with white sputum, wheezing and increased sob with exertion and at rest.  spo2 has dropped 84% with exertion. Spo2 maintaining around 92% at rest. He does not have supplemental oxygen. denied f/s/c or additional sx.  Patient is currently resting and spo2 92% HR 135. Adam Briggs is requesting direct admit if Dr. [name] recommends ED.  Dr. [name], please advise. Thanks

## 2021-01-08 NOTE — Assessment & Plan Note (Addendum)
--   Likely stable. See comment on echo below. Not clearly contributing

## 2021-01-08 NOTE — Assessment & Plan Note (Addendum)
--  bilateral, better today. Etiology unclear, albumin decent. Echo in May AoV was difficult to visualize and WMA could not be assessed. LVEF normal. TEE suggested in past to visualize aortic valve --Diuresis

## 2021-01-08 NOTE — Assessment & Plan Note (Addendum)
--   Recurrent, etiology unclear, status post 2 thoracenteses most recent 8/19. -- Causing acute hypoxic respiratory failure, along with left-sided pleural effusion, worsening on imaging. -- Follow-up pulmonology recommendations.

## 2021-01-08 NOTE — Assessment & Plan Note (Addendum)
--   Secondary to pleural effusion.  Remains short of breath, continue supplemental oxygen, work-up per pulmonology.

## 2021-01-08 NOTE — ED Notes (Signed)
Patient 87% RA while sitting. Patient placed on 2 liters .

## 2021-01-08 NOTE — Assessment & Plan Note (Signed)
--  s/p XRT

## 2021-01-08 NOTE — ED Triage Notes (Addendum)
Patient brought in by daughter. Reports thoracentesis on the 19th.  C/o increased shob with exertion. Pt states 90% RA while sitting and mid 80's with exertion or lying supine.   Pulmonologist wants patient to be seen and possible admission.   Wheelchair in triage.   Patient insisted on using the restroom first before starting triage. Patient assisted in restroom with no difficulties. UA collected.

## 2021-01-08 NOTE — Telephone Encounter (Signed)
After discussion with Dr. Chase Caller (on call at [facility]) via telephone, it is best approach for patient to present to ED for quicker response time of imaging and labs.  Need to rule out pneumothorax.   Patient's daughter, Sherry(DPR) is aware and voiced her understanding. Nothing further needed.

## 2021-01-08 NOTE — Progress Notes (Signed)
   01/08/21 2000  Assess: MEWS Score  Temp 98.7 F (37.1 C)  BP (!) 145/108  Pulse Rate (!) 116  ECG Heart Rate (!) 119  Resp (!) 21  Level of Consciousness Alert  SpO2 94 %  O2 Device Nasal Cannula  O2 Flow Rate (L/min) 2 L/min  Assess: MEWS Score  MEWS Temp 0  MEWS Systolic 0  MEWS Pulse 2  MEWS RR 1  MEWS LOC 0  MEWS Score 3  MEWS Score Color Yellow  Assess: if the MEWS score is Yellow or Red  Were vital signs taken at a resting state? Yes  Focused Assessment Change from prior assessment (see assessment flowsheet)  Does the patient meet 2 or more of the SIRS criteria? Yes  Does the patient have a confirmed or suspected source of infection? No  MEWS guidelines implemented *See Row Information* Yes  Treat  Pain Scale 0-10  Pain Score 0  Take Vital Signs  Increase Vital Sign Frequency  Yellow: Q 2hr X 2 then Q 4hr X 2, if remains yellow, continue Q 4hrs  Escalate  MEWS: Escalate Yellow: discuss with charge nurse/RN and consider discussing with provider and RRT  Notify: Provider  Provider Name/Title Gershon Cull, NP  Date Provider Notified 01/08/21  Time Provider Notified 2020  Notification Type Page  Notification Reason Change in status  Provider response See new orders  Date of Provider Response 01/08/21  Time of Provider Response 2100  Document  Patient Outcome Stabilized after interventions  Progress note created (see row info) Yes  Assess: SIRS CRITERIA  SIRS Temperature  0  SIRS Pulse 1  SIRS Respirations  1  SIRS WBC 0  SIRS Score Sum  2

## 2021-01-09 ENCOUNTER — Inpatient Hospital Stay (HOSPITAL_COMMUNITY): Payer: Medicare Other

## 2021-01-09 ENCOUNTER — Observation Stay (HOSPITAL_COMMUNITY): Payer: Medicare Other

## 2021-01-09 DIAGNOSIS — J90 Pleural effusion, not elsewhere classified: Secondary | ICD-10-CM

## 2021-01-09 DIAGNOSIS — R609 Edema, unspecified: Secondary | ICD-10-CM | POA: Diagnosis not present

## 2021-01-09 DIAGNOSIS — R932 Abnormal findings on diagnostic imaging of liver and biliary tract: Secondary | ICD-10-CM | POA: Diagnosis not present

## 2021-01-09 DIAGNOSIS — Z87891 Personal history of nicotine dependence: Secondary | ICD-10-CM | POA: Diagnosis not present

## 2021-01-09 DIAGNOSIS — E7800 Pure hypercholesterolemia, unspecified: Secondary | ICD-10-CM | POA: Diagnosis present

## 2021-01-09 DIAGNOSIS — R062 Wheezing: Secondary | ICD-10-CM | POA: Diagnosis not present

## 2021-01-09 DIAGNOSIS — K769 Liver disease, unspecified: Secondary | ICD-10-CM

## 2021-01-09 DIAGNOSIS — I4811 Longstanding persistent atrial fibrillation: Secondary | ICD-10-CM | POA: Diagnosis not present

## 2021-01-09 DIAGNOSIS — M7989 Other specified soft tissue disorders: Secondary | ICD-10-CM | POA: Diagnosis not present

## 2021-01-09 DIAGNOSIS — K7460 Unspecified cirrhosis of liver: Secondary | ICD-10-CM | POA: Diagnosis not present

## 2021-01-09 DIAGNOSIS — J9601 Acute respiratory failure with hypoxia: Secondary | ICD-10-CM | POA: Diagnosis present

## 2021-01-09 DIAGNOSIS — K7469 Other cirrhosis of liver: Secondary | ICD-10-CM

## 2021-01-09 DIAGNOSIS — R0602 Shortness of breath: Secondary | ICD-10-CM | POA: Diagnosis not present

## 2021-01-09 DIAGNOSIS — Z79899 Other long term (current) drug therapy: Secondary | ICD-10-CM | POA: Diagnosis not present

## 2021-01-09 DIAGNOSIS — Z8249 Family history of ischemic heart disease and other diseases of the circulatory system: Secondary | ICD-10-CM | POA: Diagnosis not present

## 2021-01-09 DIAGNOSIS — I10 Essential (primary) hypertension: Secondary | ICD-10-CM | POA: Diagnosis present

## 2021-01-09 DIAGNOSIS — I4819 Other persistent atrial fibrillation: Secondary | ICD-10-CM | POA: Diagnosis present

## 2021-01-09 DIAGNOSIS — J948 Other specified pleural conditions: Secondary | ICD-10-CM | POA: Diagnosis present

## 2021-01-09 DIAGNOSIS — N400 Enlarged prostate without lower urinary tract symptoms: Secondary | ICD-10-CM | POA: Diagnosis present

## 2021-01-09 DIAGNOSIS — C3412 Malignant neoplasm of upper lobe, left bronchus or lung: Secondary | ICD-10-CM | POA: Diagnosis present

## 2021-01-09 DIAGNOSIS — Z888 Allergy status to other drugs, medicaments and biological substances status: Secondary | ICD-10-CM | POA: Diagnosis not present

## 2021-01-09 DIAGNOSIS — I350 Nonrheumatic aortic (valve) stenosis: Secondary | ICD-10-CM | POA: Diagnosis present

## 2021-01-09 DIAGNOSIS — R0600 Dyspnea, unspecified: Secondary | ICD-10-CM | POA: Diagnosis not present

## 2021-01-09 DIAGNOSIS — J939 Pneumothorax, unspecified: Secondary | ICD-10-CM | POA: Diagnosis not present

## 2021-01-09 DIAGNOSIS — J9383 Other pneumothorax: Secondary | ICD-10-CM

## 2021-01-09 DIAGNOSIS — K219 Gastro-esophageal reflux disease without esophagitis: Secondary | ICD-10-CM | POA: Diagnosis present

## 2021-01-09 DIAGNOSIS — I428 Other cardiomyopathies: Secondary | ICD-10-CM | POA: Diagnosis not present

## 2021-01-09 DIAGNOSIS — I700 Atherosclerosis of aorta: Secondary | ICD-10-CM | POA: Diagnosis present

## 2021-01-09 DIAGNOSIS — I2510 Atherosclerotic heart disease of native coronary artery without angina pectoris: Secondary | ICD-10-CM | POA: Diagnosis present

## 2021-01-09 DIAGNOSIS — Z48813 Encounter for surgical aftercare following surgery on the respiratory system: Secondary | ICD-10-CM | POA: Diagnosis not present

## 2021-01-09 DIAGNOSIS — E119 Type 2 diabetes mellitus without complications: Secondary | ICD-10-CM | POA: Diagnosis present

## 2021-01-09 DIAGNOSIS — Z7901 Long term (current) use of anticoagulants: Secondary | ICD-10-CM | POA: Diagnosis not present

## 2021-01-09 DIAGNOSIS — I480 Paroxysmal atrial fibrillation: Secondary | ICD-10-CM | POA: Diagnosis not present

## 2021-01-09 DIAGNOSIS — Z8546 Personal history of malignant neoplasm of prostate: Secondary | ICD-10-CM | POA: Diagnosis not present

## 2021-01-09 DIAGNOSIS — J869 Pyothorax without fistula: Secondary | ICD-10-CM | POA: Diagnosis present

## 2021-01-09 DIAGNOSIS — Z85118 Personal history of other malignant neoplasm of bronchus and lung: Secondary | ICD-10-CM | POA: Diagnosis not present

## 2021-01-09 DIAGNOSIS — Z20822 Contact with and (suspected) exposure to covid-19: Secondary | ICD-10-CM | POA: Diagnosis present

## 2021-01-09 DIAGNOSIS — J9811 Atelectasis: Secondary | ICD-10-CM | POA: Diagnosis not present

## 2021-01-09 DIAGNOSIS — Z923 Personal history of irradiation: Secondary | ICD-10-CM | POA: Diagnosis not present

## 2021-01-09 LAB — BASIC METABOLIC PANEL
Anion gap: 8 (ref 5–15)
BUN: 27 mg/dL — ABNORMAL HIGH (ref 8–23)
CO2: 33 mmol/L — ABNORMAL HIGH (ref 22–32)
Calcium: 8.5 mg/dL — ABNORMAL LOW (ref 8.9–10.3)
Chloride: 96 mmol/L — ABNORMAL LOW (ref 98–111)
Creatinine, Ser: 0.98 mg/dL (ref 0.61–1.24)
GFR, Estimated: >60 mL/min (ref >60)
Glucose, Bld: 97 mg/dL (ref 70–99)
Potassium: 4 mmol/L (ref 3.5–5.1)
Sodium: 137 mmol/L (ref 135–145)

## 2021-01-09 LAB — URINALYSIS, ROUTINE W REFLEX MICROSCOPIC
Bilirubin Urine: NEGATIVE
Glucose, UA: NEGATIVE mg/dL
Hgb urine dipstick: NEGATIVE
Ketones, ur: NEGATIVE mg/dL
Leukocytes,Ua: NEGATIVE
Nitrite: NEGATIVE
Protein, ur: 30 mg/dL — AB
Specific Gravity, Urine: 1.01 (ref 1.005–1.030)
pH: 5 (ref 5.0–8.0)

## 2021-01-09 LAB — BODY FLUID CELL COUNT WITH DIFFERENTIAL
Eos, Fluid: 5 %
Lymphs, Fluid: 66 %
Monocyte-Macrophage-Serous Fluid: 24 % — ABNORMAL LOW (ref 50–90)
Neutrophil Count, Fluid: 3 % (ref 0–25)
Other Cells, Fluid: 2 %
Total Nucleated Cell Count, Fluid: 619 cu mm (ref 0–1000)

## 2021-01-09 LAB — LACTATE DEHYDROGENASE, PLEURAL OR PERITONEAL FLUID: LD, Fluid: 98 U/L — ABNORMAL HIGH (ref 3–23)

## 2021-01-09 LAB — PROTEIN, PLEURAL OR PERITONEAL FLUID: Total protein, fluid: <3 g/dL

## 2021-01-09 LAB — ALBUMIN, PLEURAL OR PERITONEAL FLUID: Albumin, Fluid: 1.4 g/dL

## 2021-01-09 LAB — ECHOCARDIOGRAM COMPLETE
Area-P 1/2: 4.3 cm2
Height: 70 in
Weight: 2560 oz

## 2021-01-09 LAB — GLUCOSE, PLEURAL OR PERITONEAL FLUID: Glucose, Fluid: 134 mg/dL

## 2021-01-09 MED ORDER — LIDOCAINE HCL 1 % IJ SOLN
INTRAMUSCULAR | Status: AC
  Filled 2021-01-09: qty 20

## 2021-01-09 MED ORDER — PERFLUTREN LIPID MICROSPHERE
1.0000 mL | INTRAVENOUS | Status: AC | PRN
  Administered 2021-01-09: 3 mL via INTRAVENOUS
  Filled 2021-01-09: qty 10

## 2021-01-09 MED ORDER — ALUM & MAG HYDROXIDE-SIMETH 200-200-20 MG/5ML PO SUSP
30.0000 mL | ORAL | Status: DC | PRN
  Administered 2021-01-09 – 2021-01-15 (×4): 30 mL via ORAL
  Filled 2021-01-09 (×5): qty 30

## 2021-01-09 NOTE — Assessment & Plan Note (Signed)
--  thoracentesis as per pulmonology

## 2021-01-09 NOTE — Consult Note (Signed)
NAME:  Adam Briggs, MRN:  [id number], DOB:  08-21-1929, LOS: 0 ADMISSION DATE:  01/08/2021, CONSULTATION DATE:  01/08/2021  REFERRING MD:  Dr Sarajane Jews, CHIEF COMPLAINT:  Recurrent right pleural effusion  Primary pulmonologist Dr. Leory Plowman Icard   brief   87 year old gentleman former smoker 17 pack smoking history quit in 1968.  Has past medical history of acid reflux, diabetes, hypertension hyperlipidemia and prostate cancer.,  Eliquis for atrial fibrillation admitted in May 2022 with a loculated right-sided pleural effusion with pleural thickening.  Underwent pigtail catheter placement after thoracentesis end of April 2022.  Effusion at this time showed an LDH of 134 and borderline exudate.  After the procedures he had ex vacuo pneumothorax but this pleural effusion was nondiagnostic he was also found to have a left-sided upper lobe nodule with mediastinal adenopathy.  In the middle of May 2022 he had a PET scan with a hypermetabolic left upper lobe lung mass with an SUV of 44.3 and hypermetabolic subcarinal nodes.  Underwent bronchoscopy end of May 2022.  He had atypical cells but otherwise nondiagnostic.  He was referred for empiric radiation SBRT and plans for follow-up of the right sided loculated hydropneumothorax.  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 Lisbeth Renshaw in radiation oncology mid June 2020 and plan for radiotherapy 3-5 fractions to the left upper lobe tumor and with surveillance of the right pleural effusion and mediastinal adenopathy.  He completed radiation therapy 11/23/2020.  He followed up with radiation oncology on 01/01/2021.  Part of this visit he had a chest x-ray.  Chest x-ray showed worsening of the large right-sided pleural effusion.  He also is reporting increased dyspnea.  He was referred to pulmonary.  On 01/05/2021 he underwent thoracentesis by pulmonary 500 cc of amber-colored fluid was drained.  ?  Results  sent to cytology/chemistry [currently not available in chart]  However on the day of admission 01/08/2021 wife called back saying is complaining of cough with white sputum and wheezing and worsening shortness of breath and pulse ox dropping to 84% with exertion 92% at rest.  Tachycardic at 135.  Admitted by the hospitalist with chest x-ray [personally visualized] showing worsening effusion pleural effusion on the right side and therefore pulmonary consulted  Daughter is frustrated by the recurrent nature of the pleural effusion and also the idiopathic nondiagnostic nature of the pleural effusion.  She is frustrated that no specific etiology has been found.  She is frustrated that it is recurring.  She is frustrated that only 5 cc was drained and after that it was dry.  She is wondering about transfer to either [facility] or [facility].  She believes patient is quite functional and is much younger biologically than stated age.  She is also reporting edema of the feet  Pertinent  Medical History     has a past medical history of Atrial fibrillation ([place]), BENIGN PROSTATIC HYPERTROPHY, Cataract, DIVERTICULOSIS, COLON, DM, GERD, Heart murmur, Hernia, HYPERCHOLESTEROLEMIA, HYPERTENSION, Lung cancer ([facility]), Moderate aortic stenosis (10/30/2011), Prostate cancer ([place]), and Recurrent right pleural effusion.   reports that he quit smoking about 54 years ago. His smoking use included cigarettes. He has a 17.50 pack-year smoking history. He has never used smokeless tobacco.  Past Surgical History:  Procedure Laterality Date   BRONCHIAL BIOPSY  10/17/2020   Procedure: BRONCHIAL BIOPSIES;  Surgeon: Garner Nash, DO;  Location: [facility];  Service: Pulmonary;;  BRONCHIAL BRUSHINGS  10/17/2020   Procedure: BRONCHIAL BRUSHINGS;  Surgeon: Garner Nash, DO;  Location: [facility];  Service: Pulmonary;;   BRONCHIAL NEEDLE ASPIRATION BIOPSY  10/17/2020   Procedure: BRONCHIAL NEEDLE ASPIRATION BIOPSIES;   Surgeon: Garner Nash, DO;  Location: [facility];  Service: Pulmonary;;   BRONCHIAL WASHINGS  10/17/2020   Procedure: BRONCHIAL WASHINGS;  Surgeon: Garner Nash, DO;  Location: [facility];  Service: Pulmonary;;   CATARACT EXTRACTION W/ INTRAOCULAR LENS IMPLANT  02/2013   R, then L 4 weeks later - [place]   FIDUCIAL MARKER PLACEMENT  10/17/2020   Procedure: FIDUCIAL MARKER PLACEMENT;  Surgeon: Garner Nash, DO;  Location: [facility];  Service: Pulmonary;;   INGUINAL HERNIA REPAIR     Right   INGUINAL HERNIA REPAIR Left 07/15/2014   Procedure: LEFT INGUINAL HERNIA REPAIR WITH MESH;  Surgeon: Armandina Gemma, MD;  Location: [facility];  Service: General;  Laterality: Left;   INSERTION OF MESH Left 07/15/2014   Procedure: INSERTION OF MESH;  Surgeon: Armandina Gemma, MD;  Location: [facility];  Service: General;  Laterality: Left;   RADIOACTIVE SEED IMPLANT     THORACENTESIS N/A 01/05/2021   Procedure: THORACENTESIS;  Surgeon: Candee Furbish, MD;  Location: [facility];  Service: Pulmonary;  Laterality: N/A;   VIDEO BRONCHOSCOPY WITH ENDOBRONCHIAL NAVIGATION Left 10/17/2020   Procedure: VIDEO BRONCHOSCOPY WITH ENDOBRONCHIAL NAVIGATION;  Surgeon: Garner Nash, DO;  Location: [facility];  Service: Pulmonary;  Laterality: Left;   VIDEO BRONCHOSCOPY WITH ENDOBRONCHIAL ULTRASOUND Bilateral 10/17/2020   Procedure: VIDEO BRONCHOSCOPY WITH ENDOBRONCHIAL ULTRASOUND;  Surgeon: Garner Nash, DO;  Location: [facility];  Service: Pulmonary;  Laterality: Bilateral;    Allergies  Allergen Reactions   Lisinopril Rash    Immunization History  Administered Date(s) Administered   Fluad Quad(high Dose 65+) 03/05/2019, 04/07/2020   Influenza Split 03/23/2012   Influenza Whole 02/17/2009, 03/26/2010   Influenza, High Dose Seasonal PF 04/16/2013, 03/22/2014, 01/12/2015, 02/10/2017, 03/20/2018   Influenza,inj,Quad PF,6+ Mos 01/11/2016   PFIZER Comirnaty(Gray Top)Covid-19 Tri-Sucrose Vaccine  02/18/2020   PFIZER(Purple Top)SARS-COV-2 Vaccination 06/05/2019, 06/26/2019   Pneumococcal Conjugate-13 01/12/2015   Pneumococcal Polysaccharide-23 05/23/2003   Td 10/14/2012    Family History  Problem Relation Age of Onset   Heart attack Paternal Uncle        in his 61s   Atrial fibrillation Daughter    Colon cancer Neg Hx      Current Facility-Administered Medications:    0.9 %  sodium chloride infusion, 250 mL, Intravenous, PRN, Samuella Cota, MD   acetaminophen (TYLENOL) tablet 650 mg, 650 mg, Oral, Q6H PRN **OR** acetaminophen (TYLENOL) suppository 650 mg, 650 mg, Rectal, Q6H PRN, Samuella Cota, MD   alum & mag hydroxide-simeth (MAALOX/MYLANTA) 200-200-20 MG/5ML suspension 30 mL, 30 mL, Oral, Q4H PRN, Samuella Cota, MD, 30 mL at 01/09/21 1306   fluticasone (FLONASE) 50 MCG/ACT nasal spray 2 spray, 2 spray, Each Nare, Daily PRN, Samuella Cota, MD   furosemide (LASIX) injection 20 mg, 20 mg, Intravenous, Daily, Samuella Cota, MD, 20 mg at 01/09/21 0900   levalbuterol (XOPENEX) nebulizer solution 0.63 mg, 0.63 mg, Nebulization, Q6H PRN, Kathryne Eriksson, NP, 0.63 mg at 01/09/21 0753   metoprolol tartrate (LOPRESSOR) tablet 25 mg, 25 mg, Oral, BID, Samuella Cota, MD, 25 mg at 01/09/21 0900   ondansetron (ZOFRAN) tablet 4 mg, 4 mg, Oral, Q6H PRN **OR** ondansetron (ZOFRAN) injection 4 mg, 4 mg, Intravenous, Q6H PRN, Murray Hodgkins  P, MD   polyethylene glycol (MIRALAX / GLYCOLAX) packet 17 g, 17 g, Oral, Daily PRN, Samuella Cota, MD, 17 g at 01/09/21 1120   senna-docusate (Senokot-S) tablet 1 tablet, 1 tablet, Oral, QHS, Samuella Cota, MD, 1 tablet at 01/08/21 2105   simvastatin (ZOCOR) tablet 20 mg, 20 mg, Oral, q1800, Samuella Cota, MD, 20 mg at 01/08/21 1805   sodium chloride flush (NS) 0.9 % injection 3 mL, 3 mL, Intravenous, Q12H, Samuella Cota, MD, 3 mL at 01/09/21 0900   sodium chloride flush (NS) 0.9 % injection 3 mL, 3 mL,  Intravenous, PRN, Samuella Cota, MD   tamsulosin [facility]) capsule 0.4 mg, 0.4 mg, Oral, q AM, Samuella Cota, MD, 0.4 mg at 01/09/21 0900   Significant Hospital Events: Including procedures, antibiotic start and stop dates in addition to other pertinent events   01/08/2021: Admit  Interim History / Subjective:    8/23 - still on o2 and feeling dyspenic. RUQ US showing cirrhosis but seems no evidence of portal hypertension. CT with R loculated effusion and left moderate sized effusion that is worse. Personally visualized ECHO in summer with some reduced EF. No furhter echo. 24h ur protein collection in progress  Objective   Blood pressure 118/68, pulse 82, temperature 98.5 F (36.9 C), temperature source Oral, resp. rate 20, height 5\' 10"  (1.778 m), weight 72.6 kg, SpO2 97 %.        Intake/Output Summary (Last 24 hours) at 01/09/2021 1326 Last data filed at 01/09/2021 0803 Gross per 24 hour  Intake 239 ml  Output 850 ml  Net -611 ml   Filed Weights   01/08/21 1629  Weight: 72.6 kg    Examination: General Appearance:  Looks thin. Siting in bed. Son and daughter at bedside Head:  Normocephalic, without obvious abnormality, atraumatic Eyes:  PERRL - yes, conjunctiva/corneas - muddy     Ears:  Normal external ear canals, both ears Nose:  G tube - no but has [place] o2 Throat:  ETT TUBE - no , OG - n Neck:  Supple,  No enlargement/tenderness/nodules Lungs: Clear to auscultation bilaterall Heart:  S1 and S2 normal, no murmur, CVP - no.  Pressors - no Abdomen:  Soft, no masses, no organomegaly Genitalia / Rectal:  Not done Extremities:  Extremities- intact Skin:  ntact in exposed areas . Sacral area - not examined Neurologic:  Sedation - none -> RASS - +1 . Moves all 4s - yes. CAM-ICU - neg . Orientation - x3+       Resolved Hospital Problem list   X  Assessment & Plan:  Recurrent right pleural effusion -since April/May 2022.  Borderline exudate in April 2022 with  nondiagnostic cytology and associated mediastinal adenopathy and stage Ia left upper lobe putative early in onset small cell lung cancer status post radiation  New worsening Left pleural effusion at admit 01/08/2021 on CT RUQ [place] with evidence of cirrhosis - possible etiology for effusion   Plan - INvestigate for bilateral effusions with current focus on left given loculation of right    - await duplex lower extremity because of bilateral pedal edema - Cirrhosis wkrup - defer to [facility] - needs Hep panel , possible [place] abd to rule out ascites - Get ECHO - Completert 24-hour urine protein collection 01/09/2021 PM - Check ANA, RF, CCP, DS DNA - Check QUant Gold -THORA LEFT SIDE : The fluid removal will be done by Interventional radiology (eliquis on hold per RN since  admit)   - they will remove not more than 1.5L  - fluid labs to be sent: cell count, gram stain and culture, cytology for malignant cells, chemistries for LDH, albumin,  Protein, glucose, triglyceride and lipase   Aucte hypoxemic resp fialure - mild, present on admit, due to above  01/09/2021- stable but needing o2  Plan  - o2 for pulse ox > 88%  Based on the results -> if patient/family is not satisfied with our evaluation then can consider transfer of care to [facility] or [facility] either as inpatient [place] bed availability and can be challenging] versus outpatient referral      Best Practice (right click and "Reselect all SmartList Selections" daily)   According to the hospitalist   SIGNATURE    Dr. Brand Males, M.D., F.C.C.P,  Pulmonary and Critical Care Medicine Staff Physician, [facility] Director - Interstitial Lung Disease  Program  Pulmonary [place] at [place], [place], [address]  NPI Number:  NPI #[id number]  Pager: [phone], If no answer  -> Check AMION or Try [phone] Telephone (clinical office): [phone] Telephone (research): [phone]  1:26 PM 01/09/2021     LABS    PULMONARY No results for input(s): PHART, PCO2ART, PO2ART, HCO3, TCO2, O2SAT in the last 168 hours.  Invalid input(s): PCO2, PO2  CBC Recent Labs  Lab 01/08/21 1256  HGB 12.1*  HCT 39.5  WBC 4.9  PLT 199    COAGULATION No results for input(s): INR in the last 168 hours.  CARDIAC  No results for input(s): TROPONINI in the last 168 hours. No results for input(s): PROBNP in the last 168 hours.   CHEMISTRY Recent Labs  Lab 01/08/21 1256 01/09/21 0452  NA 140 137  K 3.7 4.0  CL 99 96*  CO2 30 33*  GLUCOSE 152* 97  BUN 27* 27*  CREATININE 0.91 0.98  CALCIUM 8.8* 8.5*   Estimated Creatinine Clearance: 52.5 mL/min (by C-G formula based on SCr of 0.98 mg/dL).   LIVER Recent Labs  Lab 01/08/21 1256  AST 15  ALT 16  ALKPHOS 57  BILITOT 0.8  PROT 6.7  ALBUMIN 3.4*     INFECTIOUS No results for input(s): LATICACIDVEN, PROCALCITON in the last 168 hours.   ENDOCRINE CBG (last 3)  No results for input(s): GLUCAP in the last 72 hours.       IMAGING x48h  - image(s) personally visualized  -   highlighted in bold DG Chest 2 View  Result Date: 01/08/2021 CLINICAL DATA:  Increased shortness of breath EXAM: CHEST - 2 VIEW COMPARISON:  Chest x-ray dated January 05, 2021 FINDINGS: Visualized cardiac and mediastinal contours are unchanged. Increased size of moderate to large loculated right pleural effusion. Persistent right lung consolidation. Small left pleural effusion. No pneumothorax. IMPRESSION: Increased size of moderate to large loculated right pleural effusion when compared with most recent prior x-ray. Increased size of small left pleural effusion. Electronically Signed   By: Yetta Glassman M.D.   On: 01/08/2021 13:56   CT CHEST WO CONTRAST  Result Date: 01/08/2021 CLINICAL DATA:  Non-small cell lung cancer. Status post radiotherapy. Recurrent right-sided loculated  hydropneumothorax. EXAM: CT CHEST WITHOUT CONTRAST TECHNIQUE: Multidetector CT imaging of the chest was performed following the standard protocol without IV contrast. COMPARISON:  10/02/2020 FINDINGS: Cardiovascular: Moderate cardiac enlargement. No pericardial effusion identified. Aortic atherosclerosis. Coronary artery calcifications. Mediastinum/Nodes: Normal appearance of the  thyroid gland. The trachea appears patent and is midline. Normal appearance of the esophagus. No enlarged axillary lymph nodes. Multiple prominent mediastinal lymph nodes are again identified. None of these meet CT criteria for adenopathy. Index nodes include: -left pre-vascular lymph node measures 1.1 cm, image 58/2. Unchanged from previous exam. Right paratracheal lymph node measures 1.1 cm, image 46/2. Previously 0.8 cm. Hilar lymph nodes are suboptimally evaluated due to lack of IV contrast material. Lungs/Pleura: Moderate loculated right pleural effusion is again noted and does not appear significantly improved in volume when compared with 10/02/2020. Decrease gas within this fluid collection noted compared with 10/02/2020. Significantly diminished aeration to the right lung. Overlying areas of pleural thickening and presumed rounded atelectasis noted within the right middle lobe and right lower lobe. Multiple areas of subsegmental atelectasis also noted within the aerated portions of the right lung. Moderate to large left pleural effusion has increased in volume from the previous exam. Treated tumor within the anteromedial left upper lobe measures 1.7 cm, image 39/5. Previously 1.8 cm. Patchy areas of ground-glass and airspace density within the anterior left upper lobe is nonspecific, but favored to represent sequelae of external beam radiation. New atelectasis of the inferior lingula, image 114/5. Upper Abdomen: No acute findings within the imaged portions of the upper abdomen. Aortic atherosclerosis noted. Musculoskeletal:  Spondylosis within the thoracic spine. No acute or significant osseous findings. IMPRESSION: 1. Stable appearance of treated tumor within the anteromedial left upper lobe. Adjacent area of ground-glass and airspace density is favored to represent sequelae of external beam radiation. 2. Large left pleural effusion has significantly increased in volume when compared with 10/02/2020. 3. No significant change in volume of loculated right pleural effusion, previously characterized as empyema. Volume of gas within the right pleural fluid collection has decreased in the interval. 4. As before there is significantly diminished aeration to the right lung. Similar to previous exam is diffuse pleural thickening overlying the loculated right pleural fluid collection with areas of presumed rounded atelectasis as well as segmental and subsegmental atelectasis. 5. Aortic atherosclerosis and coronary artery calcifications. Aortic Atherosclerosis (ICD10-I70.0). Electronically Signed   By: Kerby Moors M.D.   On: 01/08/2021 19:01   US Abdomen Limited RUQ (LIVER/GB)  Result Date: 01/08/2021 CLINICAL DATA:  Right pleural effusion and suspected cirrhosis. EXAM: ULTRASOUND ABDOMEN LIMITED RIGHT UPPER QUADRANT COMPARISON:  None. FINDINGS: Gallbladder: No gallstones or wall thickening visualized. No sonographic Murphy sign noted by sonographer. Common bile duct: Diameter: 4 mm Liver: There is slight heterogeneity of the liver echogenicity with coarsened echotexture and mild surface irregularity, consistent with changes of cirrhosis. No discrete mass identified. Portal vein is patent on color Doppler imaging with normal direction of blood flow towards the liver. Other: Partially visualized right pleural effusion. IMPRESSION: 1. Cirrhosis. 2. Patent main portal vein with hepatopetal flow. 3. No ascites. 4. Right pleural effusion. Electronically Signed   By: Anner Crete M.D.   On: 01/08/2021 19:51

## 2021-01-09 NOTE — Hospital Course (Addendum)
87 year old man PMH non-small cell lung cancer left upper lobe, completed radiotherapy, recurrent right-sided loculated hydropneumothorax, underwent outpatient left thoracentesis for recurrent pleural effusion per [facility] 8/19 with removal of 500 mL.  Admitted for acute hypoxic respiratory failure secondary to recurrent increasing loculated pleural effusion.  Seen by pulmonology with plans for left sided thoracentesis.  Work-up of cirrhosis.

## 2021-01-09 NOTE — Procedures (Signed)
PROCEDURE SUMMARY:  Successful US guided left thoracentesis. Yielded 1.4L of blood tinged, amber fluid. Pt tolerated procedure well. No immediate complications.  Specimen was sent for labs. CXR ordered.  EBL < 5 mL  Tyson Alias, NP 01/09/2021 3:23 PM

## 2021-01-09 NOTE — Plan of Care (Signed)
  Problem: Education: Goal: Knowledge of General Education information will improve Description: Including pain rating scale, medication(s)/side effects and non-pharmacologic comfort measures Outcome: Progressing   Problem: Clinical Measurements: Goal: Cardiovascular complication will be avoided Outcome: Progressing   Problem: Nutrition: Goal: Adequate nutrition will be maintained Outcome: Progressing   Problem: Coping: Goal: Level of anxiety will decrease Outcome: Progressing   Problem: Elimination: Goal: Will not experience complications related to urinary retention Outcome: Progressing   Problem: Safety: Goal: Ability to remain free from injury will improve Outcome: Progressing

## 2021-01-09 NOTE — Assessment & Plan Note (Signed)
--   Noted on CT in April but not subsequently.  Discussed with Dr. Ahmed Prima today, not seen in more recent imaging. -- Continue work-up of lungs.  Consider dedicated liver imaging especially in light of diagnosis of cirrhosis.

## 2021-01-09 NOTE — Assessment & Plan Note (Addendum)
--   By ultrasound, etiology unclear.  Proceed with work-up. Will need outpatient GI followup.

## 2021-01-09 NOTE — Progress Notes (Signed)
  Echocardiogram 2D Echocardiogram has been performed.  Darlina Sicilian M 01/09/2021, 2:25 PM

## 2021-01-09 NOTE — Progress Notes (Addendum)
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  Admitted for acute hypoxic respiratory failure secondary to recurrent increasing loculated pleural effusion.  Seen by pulmonology with plans for left sided thoracentesis.  Work-up of cirrhosis.  A & P  * Loculated right lateral pneumothorax -- Recurrent, etiology unclear, status post 2 thoracenteses most recent 8/19. -- Causing acute hypoxic respiratory failure, along with left-sided pleural effusion, worsening on imaging. -- Follow-up pulmonology recommendations.  Pleural effusion, left --thoracentesis as per pulmonology  Acute hypoxemic respiratory failure ([place]) -- Secondary to pleural effusion.  Remains short of breath, continue supplemental oxygen, work-up per pulmonology.  Other cirrhosis of liver ([place]) -- By ultrasound, etiology unclear.  Proceed with work-up. Will need outpatient GI followup.  Liver lesion -- Noted on CT in April but not subsequently.  Discussed with Dr. Ahmed Prima today, not seen in more recent imaging. -- Continue work-up of lungs.  Consider dedicated liver imaging especially in light of diagnosis of cirrhosis.  Moderate aortic stenosis -- Likely stable. See comment on echo below. Not clearly contributing  Leg swelling --bilateral, better today. Etiology unclear, albumin decent. Echo in May AoV was difficult to visualize and WMA could not be assessed. LVEF normal. TEE suggested in past to visualize aortic valve --Diuresis  Malignant neoplasm of upper lobe of left lung (HCC) --s/p XRT  Atrial fibrillation (HCC) --asymptomatic. Hold apixaban until after procedure. --continue  metoprolol  Disposition Plan:  Discussion: improved  Status is: Inpatient  Remains inpatient appropriate because:IV treatments appropriate due to intensity of illness or inability to take PO and Inpatient level of care appropriate due to severity of illness  Dispo: The patient is from: Home              Anticipated d/c is to: Home              Patient currently is not medically stable to d/c.   Difficult to place patient No  DVT prophylaxis: SCDs Start: 01/08/21 1749   Code Status: Full Code Level of care: Med-Surg Family Communication: daughter and son at bedside  Murray Hodgkins, MD  [facility] Direct contact: see [email or web address] (further directions at bottom of note if needed) 7PM-7AM contact night coverage as at bottom of note 01/09/2021, 3:32 PM  LOS: 0 days   Significant Hospital Events   8/22 admit for right recurrent loculated pleural effusion   Consults:  Pulmonology    Procedures:  Thoracentesis L   Significant Diagnostic Tests:  RUQ u/s showed cirrhosis, new dx CT chest: Stable appearance of treated tumor left upper lobe.  Large left pleural effusion.  No significant change right pleural effusion loculated.   Micro Data:  None    Antimicrobials:  None   Interval History/Subjective  CC: f/u SOB  Still SOB but ok  Objective   Vitals:  Vitals:   01/09/21 1514 01/09/21 1527  BP: 108/66 (!) 121/91  Pulse:    Resp:    Temp:    SpO2:      Exam: Physical Exam Vitals reviewed.  Constitutional:      General: He is not in acute distress.    Appearance: He is ill-appearing. He is not toxic-appearing.  Cardiovascular:     Rate and Rhythm: Normal  rate. Rhythm irregular.     Heart sounds: No murmur heard.    Comments: Telemetry afib Pulmonary:     Effort: No respiratory distress.     Breath sounds: No wheezing, rhonchi or rales.     Comments: Mild to moderate increased respiratory effort, poor air movement on the left Neurological:     Mental  Status: He is alert.  Psychiatric:        Mood and Affect: Mood normal.        Behavior: Behavior normal.    I have personally reviewed the labs and other data, making special note of:   Today's Data  BMP noted U/a noted CT chest noted Abd u/s noted  Scheduled Meds:  furosemide  20 mg Intravenous Daily   lidocaine       metoprolol tartrate  25 mg Oral BID   senna-docusate  1 tablet Oral QHS   simvastatin  20 mg Oral q1800   sodium chloride flush  3 mL Intravenous Q12H   tamsulosin  0.4 mg Oral q AM   Continuous Infusions:  sodium chloride      Principal Problem:   Loculated right lateral pneumothorax Active Problems:   Acute hypoxemic respiratory failure (HCC)   Recurrent right pleural effusion   Pleural effusion, left   Moderate aortic stenosis   Liver lesion   Other cirrhosis of liver (HCC)   Atrial fibrillation (HCC)   Malignant neoplasm of upper lobe of left lung (HCC)   Leg swelling   LOS: 0 days   How to contact the [facility] Attending or Consulting provider [place] or covering provider during after hours [place], for this patient?  Check the care team in [facility] and look for a) attending/consulting [facility] provider listed and b) the [facility] team listed Log into [email or web address] and use [place]'s universal password to access. If you do not have the password, please contact the hospital operator. Locate the [facility] provider you are looking for under [facility] and page to a number that you can be directly reached. If you still have difficulty reaching the provider, please page the [facility] (Director on Call) for the Hospitalists listed on amion for assistance.

## 2021-01-10 ENCOUNTER — Inpatient Hospital Stay (HOSPITAL_COMMUNITY): Payer: Medicare Other

## 2021-01-10 ENCOUNTER — Encounter (HOSPITAL_COMMUNITY): Payer: Self-pay | Admitting: Family Medicine

## 2021-01-10 DIAGNOSIS — I4811 Longstanding persistent atrial fibrillation: Secondary | ICD-10-CM | POA: Diagnosis not present

## 2021-01-10 DIAGNOSIS — J9383 Other pneumothorax: Secondary | ICD-10-CM | POA: Diagnosis not present

## 2021-01-10 DIAGNOSIS — J9601 Acute respiratory failure with hypoxia: Secondary | ICD-10-CM | POA: Diagnosis not present

## 2021-01-10 DIAGNOSIS — R609 Edema, unspecified: Secondary | ICD-10-CM

## 2021-01-10 DIAGNOSIS — R0600 Dyspnea, unspecified: Secondary | ICD-10-CM | POA: Diagnosis not present

## 2021-01-10 LAB — CBC
HCT: 38.4 % — ABNORMAL LOW (ref 39.0–52.0)
Hemoglobin: 11.4 g/dL — ABNORMAL LOW (ref 13.0–17.0)
MCH: 25.4 pg — ABNORMAL LOW (ref 26.0–34.0)
MCHC: 29.7 g/dL — ABNORMAL LOW (ref 30.0–36.0)
MCV: 85.7 fL (ref 80.0–100.0)
Platelets: 211 10*3/uL (ref 150–400)
RBC: 4.48 MIL/uL (ref 4.22–5.81)
RDW: 15.1 % (ref 11.5–15.5)
WBC: 6.6 10*3/uL (ref 4.0–10.5)
nRBC: 0 % (ref 0.0–0.2)

## 2021-01-10 LAB — BODY FLUID CELL COUNT WITH DIFFERENTIAL
Eos, Fluid: 1 %
Lymphs, Fluid: 85 %
Monocyte-Macrophage-Serous Fluid: 10 % — ABNORMAL LOW (ref 50–90)
Neutrophil Count, Fluid: 4 % (ref 0–25)
Total Nucleated Cell Count, Fluid: 149 cu mm (ref 0–1000)

## 2021-01-10 LAB — HEPARIN LEVEL (UNFRACTIONATED): Heparin Unfractionated: 0.44 IU/mL (ref 0.30–0.70)

## 2021-01-10 LAB — PROTEIN, URINE, 24 HOUR
Collection Interval-UPROT: 24 hours
Protein, Urine: 61 mg/dL
Urine Total Volume-UPROT: 750 mL

## 2021-01-10 LAB — AMYLASE, PLEURAL OR PERITONEAL FLUID: Amylase, Fluid: 22 U/L

## 2021-01-10 LAB — ALBUMIN, PLEURAL OR PERITONEAL FLUID: Albumin, Fluid: <1 g/dL

## 2021-01-10 LAB — LACTATE DEHYDROGENASE, PLEURAL OR PERITONEAL FLUID: LD, Fluid: 80 U/L — ABNORMAL HIGH (ref 3–23)

## 2021-01-10 LAB — LACTATE DEHYDROGENASE: LDH: 109 U/L (ref 98–192)

## 2021-01-10 LAB — TRIGLYCERIDES, BODY FLUIDS: Triglycerides, Fluid: <9 mg/dL

## 2021-01-10 LAB — GLUCOSE, PLEURAL OR PERITONEAL FLUID: Glucose, Fluid: 103 mg/dL

## 2021-01-10 LAB — APTT: aPTT: 30 seconds (ref 24–36)

## 2021-01-10 LAB — HEPATITIS PANEL, ACUTE
HCV Ab: NONREACTIVE
Hep A IgM: NONREACTIVE
Hep B C IgM: NONREACTIVE
Hepatitis B Surface Ag: NONREACTIVE

## 2021-01-10 LAB — CYTOLOGY - NON PAP

## 2021-01-10 MED ORDER — LIDOCAINE HCL 1 % IJ SOLN
INTRAMUSCULAR | Status: AC
  Filled 2021-01-10: qty 20

## 2021-01-10 MED ORDER — HEPARIN (PORCINE) 25000 UT/250ML-% IV SOLN
1100.0000 [IU]/h | INTRAVENOUS | Status: DC
  Filled 2021-01-10: qty 250

## 2021-01-10 NOTE — Procedures (Addendum)
Ultrasound-guided diagnostic and therapeutic right thoracentesis performed yielding 520 cc of amber/blood-tinged fluid. No immediate complications. Follow-up chest x-ray pending. Due to pt chest discomfort only the above amount of fluid was removed today. The fluid was sent to the lab for preordered studies. EBL < 2 cc.

## 2021-01-10 NOTE — Progress Notes (Signed)
NAME:  Adam Briggs, MRN:  [id number], DOB:  09-Apr-1930, LOS: 1 ADMISSION DATE:  01/08/2021, CONSULTATION DATE:  01/08/2021  REFERRING MD:  Dr Sarajane Jews, CHIEF COMPLAINT:  Recurrent right pleural effusion  Primary pulmonologist Dr. Leory Plowman Icard   brief   87 year old gentleman former smoker 17 pack smoking history quit in 1968.  Has past medical history of acid reflux, diabetes, hypertension hyperlipidemia and prostate cancer.,  Eliquis for atrial fibrillation admitted in May 2022 with a loculated right-sided pleural effusion with pleural thickening.  Underwent pigtail catheter placement after thoracentesis end of April 2022.  Effusion at this time showed an LDH of 134 and borderline exudate.  After the procedures he had ex vacuo pneumothorax but this pleural effusion was nondiagnostic he was also found to have a left-sided upper lobe nodule with mediastinal adenopathy.  In the middle of May 2022 he had a PET scan with a hypermetabolic left upper lobe lung mass with an SUV of 53.9 and hypermetabolic subcarinal nodes.  Underwent bronchoscopy end of May 2022.  He had atypical cells but otherwise nondiagnostic.  He was referred for empiric radiation SBRT and plans for follow-up of the right sided loculated hydropneumothorax.  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 Lisbeth Renshaw in radiation oncology mid June 2020 and plan for radiotherapy 3-5 fractions to the left upper lobe tumor and with surveillance of the right pleural effusion and mediastinal adenopathy.  He completed radiation therapy 11/23/2020.  He followed up with radiation oncology on 01/01/2021.  Part of this visit he had a chest x-ray.  Chest x-ray showed worsening of the large right-sided pleural effusion.  He also is reporting increased dyspnea.  He was referred to pulmonary.  On 01/05/2021 he underwent thoracentesis by pulmonary 500 cc of amber-colored fluid was drained.  ?  Results  sent to cytology/chemistry [currently not available in chart]  However on the day of admission 01/08/2021 wife called back saying is complaining of cough with white sputum and wheezing and worsening shortness of breath and pulse ox dropping to 84% with exertion 92% at rest.  Tachycardic at 135.  Admitted by the hospitalist with chest x-ray [personally visualized] showing worsening effusion pleural effusion on the right side and therefore pulmonary consulted  Daughter is frustrated by the recurrent nature of the pleural effusion and also the idiopathic nondiagnostic nature of the pleural effusion.  She is frustrated that no specific etiology has been found.  She is frustrated that it is recurring.  She is frustrated that only 5 cc was drained and after that it was dry.  She is wondering about transfer to either [facility] or [facility].  She believes patient is quite functional and is much younger biologically than stated age.  She is also reporting edema of the feet  Pertinent  Medical History     has a past medical history of Atrial fibrillation ([place]), BENIGN PROSTATIC HYPERTROPHY, Cataract, DIVERTICULOSIS, COLON, DM, GERD, Heart murmur, Hernia, HYPERCHOLESTEROLEMIA, HYPERTENSION, Lung cancer ([facility]), Moderate aortic stenosis (10/30/2011), Prostate cancer ([place]), and Recurrent right pleural effusion.   reports that he quit smoking about 54 years ago. His smoking use included cigarettes. He has a 17.50 pack-year smoking history. He has never used smokeless tobacco.  Past Surgical History:  Procedure Laterality Date   BRONCHIAL BIOPSY  10/17/2020   Procedure: BRONCHIAL BIOPSIES;  Surgeon: Garner Nash, DO;  Location: [place] [facility];  Service: Pulmonary;;  BRONCHIAL BRUSHINGS  10/17/2020   Procedure: BRONCHIAL BRUSHINGS;  Surgeon: Garner Nash, DO;  Location: [place] [facility];  Service: Pulmonary;;   BRONCHIAL NEEDLE ASPIRATION BIOPSY  10/17/2020   Procedure: BRONCHIAL NEEDLE ASPIRATION BIOPSIES;   Surgeon: Garner Nash, DO;  Location: [place] [facility];  Service: Pulmonary;;   BRONCHIAL WASHINGS  10/17/2020   Procedure: BRONCHIAL WASHINGS;  Surgeon: Garner Nash, DO;  Location: [place] [facility];  Service: Pulmonary;;   CATARACT EXTRACTION W/ INTRAOCULAR LENS IMPLANT  02/2013   R, then L 4 weeks later - [place]   FIDUCIAL MARKER PLACEMENT  10/17/2020   Procedure: FIDUCIAL MARKER PLACEMENT;  Surgeon: Garner Nash, DO;  Location: [place] [facility]OSCOPY;  Service: Pulmonary;;   INGUINAL HERNIA REPAIR     Right   INGUINAL HERNIA REPAIR Left 07/15/2014   Procedure: LEFT INGUINAL HERNIA REPAIR WITH MESH;  Surgeon: Armandina Gemma, MD;  Location: [facility];  Service: General;  Laterality: Left;   INSERTION OF MESH Left 07/15/2014   Procedure: INSERTION OF MESH;  Surgeon: Armandina Gemma, MD;  Location: [facility];  Service: General;  Laterality: Left;   RADIOACTIVE SEED IMPLANT     THORACENTESIS N/A 01/05/2021   Procedure: THORACENTESIS;  Surgeon: Candee Furbish, MD;  Location: [facility];  Service: Pulmonary;  Laterality: N/A;   VIDEO BRONCHOSCOPY WITH ENDOBRONCHIAL NAVIGATION Left 10/17/2020   Procedure: VIDEO BRONCHOSCOPY WITH ENDOBRONCHIAL NAVIGATION;  Surgeon: Garner Nash, DO;  Location: [place];  Service: Pulmonary;  Laterality: Left;   VIDEO BRONCHOSCOPY WITH ENDOBRONCHIAL ULTRASOUND Bilateral 10/17/2020   Procedure: VIDEO BRONCHOSCOPY WITH ENDOBRONCHIAL ULTRASOUND;  Surgeon: Garner Nash, DO;  Location: [place];  Service: Pulmonary;  Laterality: Bilateral;    Allergies  Allergen Reactions   Lisinopril Rash    Immunization History  Administered Date(s) Administered   Fluad Quad(high Dose 65+) 03/05/2019, 04/07/2020   Influenza Split 03/23/2012   Influenza Whole 02/17/2009, 03/26/2010   Influenza, High Dose Seasonal PF 04/16/2013, 03/22/2014, 01/12/2015, 02/10/2017, 03/20/2018   Influenza,inj,Quad PF,6+ Mos 01/11/2016   PFIZER Comirnaty(Gray Top)Covid-19 Tri-Sucrose Vaccine  02/18/2020   PFIZER(Purple Top)SARS-COV-2 Vaccination 06/05/2019, 06/26/2019   Pneumococcal Conjugate-13 01/12/2015   Pneumococcal Polysaccharide-23 05/23/2003   Td 10/14/2012    Family History  Problem Relation Age of Onset   Heart attack Paternal Uncle        in his 18s   Atrial fibrillation Daughter    Colon cancer Neg Hx      Current Facility-Administered Medications:    0.9 %  sodium chloride infusion, 250 mL, Intravenous, PRN, Samuella Cota, MD   acetaminophen (TYLENOL) tablet 650 mg, 650 mg, Oral, Q6H PRN **OR** acetaminophen (TYLENOL) suppository 650 mg, 650 mg, Rectal, Q6H PRN, Samuella Cota, MD   alum & mag hydroxide-simeth (MAALOX/MYLANTA) 200-200-20 MG/5ML suspension 30 mL, 30 mL, Oral, Q4H PRN, Samuella Cota, MD, 30 mL at 01/09/21 1306   fluticasone (FLONASE) 50 MCG/ACT nasal spray 2 spray, 2 spray, Each Nare, Daily PRN, Samuella Cota, MD   furosemide (LASIX) injection 20 mg, 20 mg, Intravenous, Daily, Samuella Cota, MD, 20 mg at 01/10/21 0912   levalbuterol (XOPENEX) nebulizer solution 0.63 mg, 0.63 mg, Nebulization, Q6H PRN, Kathryne Eriksson, NP, 0.63 mg at 01/09/21 0753   metoprolol tartrate (LOPRESSOR) tablet 25 mg, 25 mg, Oral, BID, Samuella Cota, MD, 25 mg at 01/10/21 0911   ondansetron (ZOFRAN) tablet 4 mg, 4 mg, Oral, Q6H PRN **OR** ondansetron (ZOFRAN) injection 4 mg, 4 mg, Intravenous, Q6H PRN, Murray Hodgkins  P, MD   polyethylene glycol (MIRALAX / GLYCOLAX) packet 17 g, 17 g, Oral, Daily PRN, Samuella Cota, MD, 17 g at 01/09/21 1120   senna-docusate (Senokot-S) tablet 1 tablet, 1 tablet, Oral, QHS, Samuella Cota, MD, 1 tablet at 01/09/21 2052   simvastatin (ZOCOR) tablet 20 mg, 20 mg, Oral, q1800, Samuella Cota, MD, 20 mg at 01/09/21 1751   sodium chloride flush (NS) 0.9 % injection 3 mL, 3 mL, Intravenous, Q12H, Samuella Cota, MD, 3 mL at 01/10/21 0912   sodium chloride flush (NS) 0.9 % injection 3 mL, 3 mL,  Intravenous, PRN, Samuella Cota, MD   tamsulosin [facility]) capsule 0.4 mg, 0.4 mg, Oral, q AM, Samuella Cota, MD, 0.4 mg at 01/10/21 [facility] Events: Including procedures, antibiotic start and stop dates in addition to other pertinent events   8/19 Rt Thora - 500cc. Chronic inflammation but non diagnostic. No chemistry 01/08/2021: Admit 8/23 - still on o2 and feeling dyspenic. RUQ US showing cirrhosis but seems no evidence of portal hypertension. CT with R loculated effusion and left moderate sized effusion that is worse. Personally visualized ECHO in summer with some reduced EF. No furhter echo. 24h ur protein collection in progress 24h ur prot  - normal RUQ [place] with cirrhosis + but portal veins ok Echo - good ef but poor windows (few months earlier had moderate AS) Left thora 1.5L - likely transudate  Interim History / Subjective:   8/24 - better after 1.5L thora. Down from 5l [place] to 2L [place]. Pulse ox 95% on 2L but 85% on RA. Did feel dyspneic going to toilet. Daughter at bedside keen to get to b[place]om of effusion etiology  - 24h ur prot  - normal RUQ [place] with cirrhosis + but portal veins ok Echo - good ef but poor windows (few months earlier had moderate AS) Left thora 1.5L - likely transudate  Objective   Blood pressure 133/74, pulse (!) 104, temperature 98.1 F (36.7 C), temperature source Oral, resp. rate 20, height 5\' 10"  (1.778 m), weight 72.6 kg, SpO2 94 %.        Intake/Output Summary (Last 24 hours) at 01/10/2021 0949 Last data filed at 01/10/2021 0730 Gross per 24 hour  Intake 820 ml  Output 475 ml  Net 345 ml   Filed Weights   01/08/21 1629  Weight: 72.6 kg    General Appearance:  Looks frail but pleasant and comfortable Head:  Normocephalic, without obvious abnormality, atraumatic Eyes:  PERRL - yes, conjunctiva/corneas - clear     Ears:  Normal external ear canals, both ears Nose:  G tube - no bu has [place] 2L Throat:  ETT TUBE - no , OG tube  - no Neck:  Supple,  No enlargement/tenderness/nodules Lungs: Clear to auscultation bilaterally, Rt air entry diminished Heart:  S1 and S2 normal, ESM - all over.  Pressors - no Abdomen:  Soft, no masses, no organomegaly Genitalia / Rectal:  Not done Extremities:  Extremities- intact Skin:  ntact in exposed areas . Sacral area - not examined Neurologic:  Sedation - none -> RASS - +1 . Moves all 4s - yes. CAM-ICU - neg . Orientation - x3+         Resolved Hospital Problem list   X  Assessment & Plan:  [place] prior to and at admit  - mediastinal adenopathy and stage Ia left upper lobe putative early in onset small cell lung cancer status post radiation  Recurrent right pleural effusion -since April/May 2022.  Borderline exudate in April 2022 with nondiagnostic cytology in April 2022 and 01/05/21  - Currently this admit on CT - loculated and poersistent  Plan  - aim thora IR guided on Right side for chemistry and cytology  - if horrible exudate then consider VATS this admit but otherwise aim to workup as single etiology for bilateral effusion   New worsening Left pleural effusion at admit 01/08/2021 on CT  - thora 01/09/21 - likely transudate RUQ [place] with evidence of cirrhosis - possible etiology for effusion but no portal hyperrension ECHO with possible and likely aortic stenosis 24h ur prot is normal  Plan - awit cytology left effusion -   - order duplex lower extremity because of bilateral pedal edema - Cirrhosis wkrup - defer to [facility] - needs Hep panel   -d/w Dr Tana Coast - Possible mode - severe AS on echo 01/09/21 - d/w Dr Myrla Halsted to consider TEE  -  AWait ANA, RF, CCP, DS DNA and Quant Gold 01/10/21    CVTS consult (family wishes to be aggressive) depending on results of above   Aucte hypoxemic resp fialure - mild, present on admit, due to above  01/10/2021- improved from 5L [place]  but needing o2 2L   Plan  - o2 for pulse ox > 88%  Based on the results -> if  patient/family is not satisfied with our evaluation then can consider transfer of care to [facility] or [facility] either as inpatient [place] bed availability and can be challenging] versus outpatient referral   D/w Dr Jiles Garter    Dr. Brand Males, M.D., F.C.C.P,  Pulmonary and Critical Care Medicine Staff Physician, [place] Director - Interstitial Lung Disease  Program  Pulmonary [place] [facility] [place], [place], [address]  NPI Number:  NPI #[id number]  Pager: [phone], If no answer  -> Check AMION or Try [place] Telephone (clinical office): [phone] Telephone (research): [phone]  10:03 AM 01/10/2021       Best Practice (right click and "Reselect all SmartList Selections" daily)   According to the hospitalist   SIGNATURE    Dr. Brand Males, M.D., F.C.C.P,  Pulmonary and Critical Care Medicine Staff Physician, [place] Director - Interstitial Lung Disease  Program  Pulmonary [place] [facility] [place], [place], [address]  NPI Number:  NPI #[id number]  Pager: [phone], If no answer  -> Check AMION or Try [phone] Telephone (clinical office): [phone] Telephone (research): [phone]  9:49 AM 01/10/2021     LABS    PULMONARY No results for input(s): PHART, PCO2ART, PO2ART, HCO3, TCO2, O2SAT in the last 168 hours.  Invalid input(s): PCO2, PO2  CBC Recent Labs  Lab 01/08/21 1256  HGB 12.1*  HCT 39.5  WBC 4.9  PLT 199    COAGULATION No results for input(s): INR in the last 168 hours.  CARDIAC  No results for input(s): TROPONINI in the last 168 hours. No results for input(s): PROBNP in the last 168 hours.   CHEMISTRY Recent Labs  Lab 01/08/21 1256 01/09/21 0452  NA 140 137  K 3.7 4.0  CL 99 96*  CO2 30 33*  GLUCOSE 152* 97  BUN 27* 27*  CREATININE  0.91 0.98  CALCIUM 8.8* 8.5*   Estimated Creatinine Clearance: 52.5  mL/min (by C-G formula based on SCr of 0.98 mg/dL).   LIVER Recent Labs  Lab 01/08/21 1256  AST 15  ALT 16  ALKPHOS 57  BILITOT 0.8  PROT 6.7  ALBUMIN 3.4*     INFECTIOUS No results for input(s): LATICACIDVEN, PROCALCITON in the last 168 hours.   ENDOCRINE CBG (last 3)  No results for input(s): GLUCAP in the last 72 hours.       IMAGING x48h  - image(s) personally visualized  -   highlighted in bold DG Chest 1 View  Result Date: 01/09/2021 CLINICAL DATA:  Status post left thoracentesis EXAM: CHEST  1 VIEW COMPARISON:  01/08/2021 FINDINGS: Single frontal view of the chest demonstrates near complete resolution of the left pleural effusion after interval left-sided thoracentesis. No evidence of left-sided pneumothorax. Minimal consolidation at the left lung base consistent with residual atelectasis. There is persistent loculated right pleural effusion consistent with known empyema. Multifocal consolidation throughout the right lung is stable. Cardiac silhouette is unchanged. No acute bony abnormalities. IMPRESSION: 1. No complication after left-sided thoracentesis. Near complete resolution of left pleural effusion. 2. Stable known right empyema and underlying right lung consolidation. Electronically Signed   By: Randa Ngo M.D.   On: 01/09/2021 15:57   DG Chest 2 View  Result Date: 01/08/2021 CLINICAL DATA:  Increased shortness of breath EXAM: CHEST - 2 VIEW COMPARISON:  Chest x-ray dated January 05, 2021 FINDINGS: Visualized cardiac and mediastinal contours are unchanged. Increased size of moderate to large loculated right pleural effusion. Persistent right lung consolidation. Small left pleural effusion. No pneumothorax. IMPRESSION: Increased size of moderate to large loculated right pleural effusion when compared with most recent prior x-ray. Increased size of small left pleural effusion. Electronically  Signed   By: Yetta Glassman M.D.   On: 01/08/2021 13:56   CT CHEST WO CONTRAST  Result Date: 01/08/2021 CLINICAL DATA:  Non-small cell lung cancer. Status post radiotherapy. Recurrent right-sided loculated hydropneumothorax. EXAM: CT CHEST WITHOUT CONTRAST TECHNIQUE: Multidetector CT imaging of the chest was performed following the standard protocol without IV contrast. COMPARISON:  10/02/2020 FINDINGS: Cardiovascular: Moderate cardiac enlargement. No pericardial effusion identified. Aortic atherosclerosis. Coronary artery calcifications. Mediastinum/Nodes: Normal appearance of the thyroid gland. The trachea appears patent and is midline. Normal appearance of the esophagus. No enlarged axillary lymph nodes. Multiple prominent mediastinal lymph nodes are again identified. None of these meet CT criteria for adenopathy. Index nodes include: -left pre-vascular lymph node measures 1.1 cm, image 58/2. Unchanged from previous exam. Right paratracheal lymph node measures 1.1 cm, image 46/2. Previously 0.8 cm. Hilar lymph nodes are suboptimally evaluated due to lack of IV contrast material. Lungs/Pleura: Moderate loculated right pleural effusion is again noted and does not appear significantly improved in volume when compared with 10/02/2020. Decrease gas within this fluid collection noted compared with 10/02/2020. Significantly diminished aeration to the right lung. Overlying areas of pleural thickening and presumed rounded atelectasis noted within the right middle lobe and right lower lobe. Multiple areas of subsegmental atelectasis also noted within the aerated portions of the right lung. Moderate to large left pleural effusion has increased in volume from the previous exam. Treated tumor within the anteromedial left upper lobe measures 1.7 cm, image 39/5. Previously 1.8 cm. Patchy areas of ground-glass and airspace density within the anterior left upper lobe is nonspecific, but favored to represent sequelae of  external beam radiation. New atelectasis of the inferior lingula, image 114/5. Upper Abdomen: No acute findings within the imaged portions of  the upper abdomen. Aortic atherosclerosis noted. Musculoskeletal: Spondylosis within the thoracic spine. No acute or significant osseous findings. IMPRESSION: 1. Stable appearance of treated tumor within the anteromedial left upper lobe. Adjacent area of ground-glass and airspace density is favored to represent sequelae of external beam radiation. 2. Large left pleural effusion has significantly increased in volume when compared with 10/02/2020. 3. No significant change in volume of loculated right pleural effusion, previously characterized as empyema. Volume of gas within the right pleural fluid collection has decreased in the interval. 4. As before there is significantly diminished aeration to the right lung. Similar to previous exam is diffuse pleural thickening overlying the loculated right pleural fluid collection with areas of presumed rounded atelectasis as well as segmental and subsegmental atelectasis. 5. Aortic atherosclerosis and coronary artery calcifications. Aortic Atherosclerosis (ICD10-I70.0). Electronically Signed   By: Kerby Moors M.D.   On: 01/08/2021 19:01   ECHOCARDIOGRAM COMPLETE  Result Date: 01/09/2021    ECHOCARDIOGRAM REPORT   Patient Name:   DANIS PEMBLETON Date of Exam: 01/09/2021 Medical Rec #:  [id number]     Height:       70.0 in Accession #:    [id number]    Weight:       160.0 lb Date of Birth:  23-May-1929     BSA:          1.898 m Patient Age:    73 years      BP:           118/68 mmHg Patient Gender: M             HR:           82 bpm. Exam Location:  Inpatient Procedure: 2D Echo, Cardiac Doppler and Color Doppler Indications:    Cardiomyopathy-Unspecified I42.9  History:        Patient has prior history of Echocardiogram examinations, most                 recent 09/20/2020. Aortic Valve Disease; Arrythmias:Atrial                  Fibrillation. Non-small cell lung cancer. Right pneumothorax.                 Acute hypoxemic respiratory failure.  Sonographer:    Darlina Sicilian RDCS Referring Phys: [phone] [facility]  Sonographer Comments: Suboptimal parasternal window, suboptimal apical window and suboptimal subcostal window. Image acquisition challenging due to respiratory motion. IMPRESSIONS  1. Largely nondiagnostic study due to poor acoustic windows for imaging. Limited findings as noted below.  2. Left ventricular ejection fraction, by estimation, is 65 to 70%. The left ventricle has normal function. Left ventricular diastolic parameters are indeterminate.  3. Large pleural effusion.  4. The mitral valve is degenerative.  5. The aortic valve is abnormal. FINDINGS  Left Ventricle: Left ventricular ejection fraction, by estimation, is 65 to 70%. The left ventricle has normal function. Definity contrast agent was given IV to delineate the left ventricular endocardial borders. Left ventricular diastolic parameters are indeterminate. Mitral Valve: The mitral valve is degenerative in appearance. Aortic Valve: The aortic valve is abnormal. Additional Comments: There is a large pleural effusion.  LEFT VENTRICLE PLAX 2D LVOT diam:     1.60 cm LVOT Area:     2.01 cm   AORTA Ao Root diam: 3.00 cm MITRAL VALVE MV Area (PHT): 4.30 cm     SHUNTS MV Decel Time: 176 msec     Systemic Diam:  1.60 cm MV E velocity: 109.33 cm/s Cherlynn Kaiser MD Electronically signed by Cherlynn Kaiser MD Signature Date/Time: 01/09/2021/5:29:15 PM    Final    US Abdomen Limited RUQ (LIVER/GB)  Result Date: 01/08/2021 CLINICAL DATA:  Right pleural effusion and suspected cirrhosis. EXAM: ULTRASOUND ABDOMEN LIMITED RIGHT UPPER QUADRANT COMPARISON:  None. FINDINGS: Gallbladder: No gallstones or wall thickening visualized. No sonographic Murphy sign noted by sonographer. Common bile duct: Diameter: 4 mm Liver: There is slight heterogeneity of the liver echogenicity with  coarsened echotexture and mild surface irregularity, consistent with changes of cirrhosis. No discrete mass identified. Portal vein is patent on color Doppler imaging with normal direction of blood flow towards the liver. Other: Partially visualized right pleural effusion. IMPRESSION: 1. Cirrhosis. 2. Patent main portal vein with hepatopetal flow. 3. No ascites. 4. Right pleural effusion. Electronically Signed   By: Anner Crete M.D.   On: 01/08/2021 19:51   US THORACENTESIS ASP PLEURAL SPACE W/IMG GUIDE  Result Date: 01/10/2021 INDICATION: Patient with history of non-small cell lung cancer, left pleural effusion; request for left thoracentesis EXAM: ULTRASOUND GUIDED DIAGNOSTIC AND THERAPEUTIC LEFT THORACENTESIS MEDICATIONS: 5 ML 1% LIDOCAINE COMPLICATIONS: None immediate. PROCEDURE: An ultrasound guided thoracentesis was thoroughly discussed with the patient and questions answered. The benefits, risks, alternatives and complications were also discussed. The patient understands and wishes to proceed with the procedure. Written consent was obtained. Ultrasound was performed to localize and mark an adequate pocket of fluid in the left chest. The area was then prepped and draped in the normal sterile fashion. 1% Lidocaine was used for local anesthesia. Under ultrasound guidance a 6 Fr Safe-T-Centesis catheter was introduced. Thoracentesis was performed. The catheter was removed and a dressing applied. FINDINGS: A total of approximately 1.4 L of blood-tinged, amber fluid was removed. Samples were sent to the laboratory as requested by the clinical team. IMPRESSION: Successful ultrasound guided diagnostic and therapeutic left thoracentesis yielding 1.4 L of pleural fluid. Read by: Narda Rutherford, NP Electronically Signed   By: Lucrezia Europe M.D.   On: 01/09/2021 15:40

## 2021-01-10 NOTE — Progress Notes (Addendum)
ANTICOAGULATION CONSULT NOTE - Initial Consult  Pharmacy Consult for heparin Indication: atrial fibrillation; apixaban on hold  Allergies  Allergen Reactions   Lisinopril Rash    Patient Measurements: Height: 5\' 10"  (177.8 cm) Weight: 72.6 kg (160 lb) IBW/kg (Calculated) : 73 Heparin Dosing Weight: n/a. Use TBW = 73 kg  Vital Signs: Temp: 98.1 F (36.7 C) (08/24 0536) Temp Source: Oral (08/24 0536) BP: 133/74 (08/24 0536) Pulse Rate: 104 (08/24 0911)  Labs: Recent Labs    01/08/21 1256 01/09/21 0452  HGB 12.1*  --   HCT 39.5  --   PLT 199  --   CREATININE 0.91 0.98    Estimated Creatinine Clearance: 52.5 mL/min (by C-G formula based on SCr of 0.98 mg/dL).   Medical History: Past Medical History:  Diagnosis Date   Atrial fibrillation ([place])    BENIGN PROSTATIC HYPERTROPHY    Cataract    DIVERTICULOSIS, COLON    DM    not diabetic-has been off metformin for about 2 years as of 09/2020 pr daughter   GERD    Heart murmur    Hernia    HYPERCHOLESTEROLEMIA    HYPERTENSION    Lung cancer ([place])    Moderate aortic stenosis 10/30/2011   echo 2013   Prostate cancer ([place])    s/p seed implant   Recurrent right pleural effusion     Medications: Apixaban 5 mg PO BID PTA for atrial fibrillation -Last dose: 8/22 @ 8:00 AM   Assessment: Pt is an 70 yoM prescribed apixaban PTA for atrial fibrillation. PMH significant for pleural effusion, s/p thoracentesis on 8/19 PTA. Apixaban held on admission in anticipation for possible procedures. Pt underwent thoracentesis again on 8/23. Pharmacy consulted to dose IV heparin for anticoagulation while DOAC on hold.   Today, 01/10/21 STAT baseline CBC, HL, and aPTT ordered Anticipate baseline HL to be falsely elevated due to recent DOAC  Goal of Therapy:  Heparin level 0.3-0.7 units/ml Monitor platelets by anticoagulation protocol: Yes   Plan:  No initial bolus per discussion with MD Initiate heparin infusion at 1100 units/hr  (~15 units/kg/hr) Check 8 hour HL/aPTT. If baseline HL elevated, will need to monitor using aPTT until aPTT & HL correlate. HL/aPTT, CBC daily while on heparin infusion Monitor for signs of bleeding Follow for ability to transition back to PO anticoagulation  Lenis Noon, PharmD 01/10/2021,10:22 AM  Addendum: Patient underwent thoracentesis today yielding 520 mL of amber/blood tinged fluid.   Discussed post-procedure anticoagulation with CCM, IR, TRH - heparin drip to be held today with potential to start tomorrow morning. CBC ordered with AM labs tomorrow.   Lenis Noon, PharmD 01/10/21 1:31 PM

## 2021-01-10 NOTE — Progress Notes (Signed)
[facility]                                                                              Patient Demographics  Adam Briggs, is a 87 y.o. male, DOB - 04/20/1930, UDJ:[id number]  Admit date - 01/08/2021   Admitting Physician Samuella Cota, MD  Outpatient Primary MD for the patient is [name], MD  Outpatient specialists:   LOS - 1  days   Medical records reviewed and are as summarized below:    Chief Complaint  Patient presents with   Shortness of Breath       Brief summary   87 year old male with a history of NSCLC left upper lobe, completed XRT, recurrent right-sided loculated hydropneumothorax, underwent outpatient left thoracentesis for rec pleural effusion per PCCM on 8/19 with removal of 500 cc.  Patient was admitted for acute hypoxic respiratory failure secondary to recurrent increasing loculated pleural effusion.    Assessment & Plan    New worsening left pleural effusion, acute hypoxic respiratory failure -Seen on CT 8/22, underwent thoracentesis on 8/23 -Removed 1.4 L of blood-tinged fluid, follow studies -Improving, was requiring 5 L [place] but now on 2 L O2 via [place], sats 99% -Currently on Lasix 20 mg IV daily  Loculated right lateral pleural effusion, recurrent -Since April/May 2022, nondiagnostic cytology in 08/2020 and 01/05/2021, loculated and persistent -Discussed with patient's daughter and Dr. Chase Caller in detail. -Plan for IR guided thoracentesis today, if exudative, then discussed with CT VS for possible VATS  Cirrhosis, noted on RUQ [place]: New diagnosis -2D echo showed EF of 65 to 70%, aortic stenosis.  Previously has been recommended TEE -24-hour urine protein normal -Ultrasound showed no ascites, patent main portal vein, no portal hypertension, ?  Passive congestion -LFTs, alk phos, bilirubin normal on 8/22, hepatitis panel negative  Possible aortic stenosis on echo 01/09/2021 -Previously has been recommended  TEE -Patient followed by Dr. Percival Spanish, office note on 11/03/2020 had noted moderate aortic stenosis and repeat echo in 1 year.  No acute concerns were noted. -If CT VS procedure is planned, will discuss with cardiology if TEE needed    Persistent atrial fibrillation -Heart rate controlled, continue metoprolol 25 twice daily -Eliquis has been on hold, will place on IV heparin  BPH -Continue tamsulosin  History of NSCLC -Outpatient follow-up with pulmonology, radiation oncology, oncology  Essential hypertension -Continue Lasix, beta-blocker  Hyperlipidemia -Continue statin  Estimated body mass index is 22.96 kg/m as calculated from the following:   Height as of this encounter: $RemoveBeforeD'5\' 10"'[id number]$  (1.778 m).   Weight as of this encounter: 72.6 kg.  Code Status: Full code DVT Prophylaxis:  SCDs Start: 01/08/21 1749   Level of Care: Level of care: Med-Surg Family Communication: Discussed all imaging results, lab results, explained to the patient and daughter at the bedside   Disposition Plan:     Status is: Inpatient  Remains inpatient appropriate because:Inpatient level of care appropriate due to severity of illness  Dispo: The patient is from: Home              Anticipated d/c is to: Home  Patient currently is not medically stable to d/c.   Difficult to place patient No      Time Spent in minutes   17mins   Procedures:  Thoracentesis left, 1.4 L removed on 8/23 Ultrasound thoracentesis, right 8/24 with 520 cc removed  Consultants:   Pulmonology  Antimicrobials:   Anti-infectives (From admission, onward)    None          Medications  Scheduled Meds:  furosemide  20 mg Intravenous Daily   lidocaine       metoprolol tartrate  25 mg Oral BID   senna-docusate  1 tablet Oral QHS   simvastatin  20 mg Oral q1800   sodium chloride flush  3 mL Intravenous Q12H   tamsulosin  0.4 mg Oral q AM   Continuous Infusions:  sodium chloride     PRN  Meds:.sodium chloride, acetaminophen **OR** acetaminophen, alum & mag hydroxide-simeth, fluticasone, levalbuterol, ondansetron **OR** ondansetron (ZOFRAN) IV, polyethylene glycol, sodium chloride flush      Subjective:   Adam Briggs was seen and examined today.  Overall improving after thoracentesis.  No acute chest pain.  No fevers or chills.  Daughter at the bedside, hoping for options due to recurrent issues with the pleural effusion.  Patient denies dizziness, abdominal pain, N/V/D/C, new weakness, numbess, tingling. No acute events overnight.    Objective:   Vitals:   01/10/21 0911 01/10/21 1100 01/10/21 1157 01/10/21 1300  BP:  125/78 108/70 106/61  Pulse: (!) 104   83  Resp:    (!) 25  Temp:    98.5 F (36.9 C)  TempSrc:    Oral  SpO2:    99%  Weight:      Height:        Intake/Output Summary (Last 24 hours) at 01/10/2021 1337 Last data filed at 01/10/2021 0730 Gross per 24 hour  Intake 700 ml  Output 475 ml  Net 225 ml     Wt Readings from Last 3 Encounters:  01/08/21 72.6 kg  11/27/20 74.1 kg  11/03/20 73.8 kg     Exam General: Alert and oriented x 3, NAD, frail and ill-appearing Cardiovascular: Irregularly irregular Respiratory: Diminished breath sound at the bases, no wheezing Gastrointestinal: Soft, nontender, nondistended, + bowel sounds Ext: 1+ pedal edema bilaterally Neuro: moving all 4 extremities Musculoskeletal: No digital cyanosis, clubbing Skin: No rashes Psych: Normal affect and demeanor, alert and oriented x3    Data Reviewed:  I have personally reviewed following labs and imaging studies  Micro Results Recent Results (from the past 240 hour(s))  Resp Panel by RT-PCR (Flu A&B, Covid) Nasopharyngeal Swab     Status: None   Collection Time: 01/08/21  1:02 PM   Specimen: Nasopharyngeal Swab; Nasopharyngeal(NP) swabs in vial transport medium  Result Value Ref Range Status   SARS Coronavirus 2 by RT PCR NEGATIVE NEGATIVE Final    Comment:  (NOTE) SARS-CoV-2 target nucleic acids are NOT DETECTED.  The SARS-CoV-2 RNA is generally detectable in upper respiratory specimens during the acute phase of infection. The lowest concentration of SARS-CoV-2 viral copies this assay can detect is 138 copies/mL. A negative result does not preclude SARS-Cov-2 infection and should not be used as the sole basis for treatment or other patient management decisions. A negative result may occur with  improper specimen collection/handling, submission of specimen other than nasopharyngeal swab, presence of viral mutation(s) within the areas targeted by this assay, and inadequate number of viral copies(<138 copies/mL). A negative result must be  combined with clinical observations, patient history, and epidemiological information. The expected result is Negative.  Fact Sheet for Patients:  EntrepreneurPulse.com.au  Fact Sheet for Healthcare Providers:  IncredibleEmployment.be  This test is no t yet approved or cleared by the [place] FDA and  has been authorized for detection and/or diagnosis of SARS-CoV-2 by FDA under an Emergency Use Authorization (EUA). This EUA will remain  in effect (meaning this test can be used) for the duration of the COVID-19 declaration under Section 564(b)(1) of the Act, 21 U.S.C.section 360bbb-3(b)(1), unless the authorization is terminated  or revoked sooner.       Influenza A by PCR NEGATIVE NEGATIVE Final   Influenza B by PCR NEGATIVE NEGATIVE Final    Comment: (NOTE) The Xpert Xpress SARS-CoV-2/FLU/RSV plus assay is intended as an aid in the diagnosis of influenza from Nasopharyngeal swab specimens and should not be used as a sole basis for treatment. Nasal washings and aspirates are unacceptable for Xpert Xpress SARS-CoV-2/FLU/RSV testing.  Fact Sheet for Patients: EntrepreneurPulse.com.au  Fact Sheet for Healthcare  Providers: IncredibleEmployment.be  This test is not yet approved or cleared by the [place] FDA and has been authorized for detection and/or diagnosis of SARS-CoV-2 by FDA under an Emergency Use Authorization (EUA). This EUA will remain in effect (meaning this test can be used) for the duration of the COVID-19 declaration under Section 564(b)(1) of the Act, 21 U.S.C. section 360bbb-3(b)(1), unless the authorization is terminated or revoked.  Performed at [facility], [place] [address], [place], [place] [address]   Body fluid culture w Gram Stain     Status: None (Preliminary result)   Collection Time: 01/09/21  3:43 PM   Specimen: PATH Cytology Pleural fluid  Result Value Ref Range Status   Specimen Description   Final    PLEURAL Performed at [facility] [address] [place], [place] [address]    Special Requests   Final    NONE Performed at [facility], [place] [address], [place], [place] [address]    Gram Stain   Final    RARE WBC PRESENT,BOTH PMN AND MONONUCLEAR NO ORGANISMS SEEN    Culture   Final    NO GROWTH < 12 HOURS Performed at [facility], [place] [address], [place], [place] [address]    Report Status PENDING  Incomplete    Radiology Reports DG Chest 1 View  Result Date: 01/10/2021 CLINICAL DATA:  Status post right thoracentesis. EXAM: CHEST  1 VIEW COMPARISON:  01/09/2021 FINDINGS: Large loculated right pleural effusion is again identified. There has been slight decrease in volume of the right pleural effusion with mildly improved aeration to the right mid lung. No definite pneumothorax identified. Small left pleural effusion appears unchanged. IMPRESSION: 1. Slight decrease in volume of large loculated right pleural effusion status post thoracentesis. 2. No pneumothorax. Electronically Signed   By: Kerby Moors M.D.   On: 01/10/2021 13:02   DG Chest 1 View  Result Date:  01/09/2021 CLINICAL DATA:  Status post left thoracentesis EXAM: CHEST  1 VIEW COMPARISON:  01/08/2021 FINDINGS: Single frontal view of the chest demonstrates near complete resolution of the left pleural effusion after interval left-sided thoracentesis. No evidence of left-sided pneumothorax. Minimal consolidation at the left lung base consistent with residual atelectasis. There is persistent loculated right pleural effusion consistent with known empyema. Multifocal consolidation throughout the right lung is stable. Cardiac silhouette is unchanged. No acute bony abnormalities. IMPRESSION: 1. No  complication after left-sided thoracentesis. Near complete resolution of left pleural effusion. 2. Stable known right empyema and underlying right lung consolidation. Electronically Signed   By: Randa Ngo M.D.   On: 01/09/2021 15:57   DG Chest 2 View  Result Date: 01/08/2021 CLINICAL DATA:  Increased shortness of breath EXAM: CHEST - 2 VIEW COMPARISON:  Chest x-ray dated January 05, 2021 FINDINGS: Visualized cardiac and mediastinal contours are unchanged. Increased size of moderate to large loculated right pleural effusion. Persistent right lung consolidation. Small left pleural effusion. No pneumothorax. IMPRESSION: Increased size of moderate to large loculated right pleural effusion when compared with most recent prior x-ray. Increased size of small left pleural effusion. Electronically Signed   By: Yetta Glassman M.D.   On: 01/08/2021 13:56   DG Chest 2 View  Result Date: 01/02/2021 CLINICAL DATA:  Dyspnea. EXAM: CHEST - 2 VIEW COMPARISON:  November 27, 2020. FINDINGS: Stable cardiomediastinal silhouette. No definite pneumothorax is noted. Stable large probable loculated right pleural effusion is noted extending from the apex to the base along the lateral wall. Atelectasis or infiltrates are noted in the aerated portions of the right lung. Minimal left basilar subsegmental atelectasis is noted. Bony thorax is  unremarkable. IMPRESSION: Stable large loculated right pleural effusion is noted. Atelectasis or infiltrates are noted in the aerated portions of the right lung. Minimal left basilar subsegmental atelectasis. Electronically Signed   By: Marijo Conception M.D.   On: 01/02/2021 16:53   CT CHEST WO CONTRAST  Result Date: 01/08/2021 CLINICAL DATA:  Non-small cell lung cancer. Status post radiotherapy. Recurrent right-sided loculated hydropneumothorax. EXAM: CT CHEST WITHOUT CONTRAST TECHNIQUE: Multidetector CT imaging of the chest was performed following the standard protocol without IV contrast. COMPARISON:  10/02/2020 FINDINGS: Cardiovascular: Moderate cardiac enlargement. No pericardial effusion identified. Aortic atherosclerosis. Coronary artery calcifications. Mediastinum/Nodes: Normal appearance of the thyroid gland. The trachea appears patent and is midline. Normal appearance of the esophagus. No enlarged axillary lymph nodes. Multiple prominent mediastinal lymph nodes are again identified. None of these meet CT criteria for adenopathy. Index nodes include: -left pre-vascular lymph node measures 1.1 cm, image 58/2. Unchanged from previous exam. Right paratracheal lymph node measures 1.1 cm, image 46/2. Previously 0.8 cm. Hilar lymph nodes are suboptimally evaluated due to lack of IV contrast material. Lungs/Pleura: Moderate loculated right pleural effusion is again noted and does not appear significantly improved in volume when compared with 10/02/2020. Decrease gas within this fluid collection noted compared with 10/02/2020. Significantly diminished aeration to the right lung. Overlying areas of pleural thickening and presumed rounded atelectasis noted within the right middle lobe and right lower lobe. Multiple areas of subsegmental atelectasis also noted within the aerated portions of the right lung. Moderate to large left pleural effusion has increased in volume from the previous exam. Treated tumor within  the anteromedial left upper lobe measures 1.7 cm, image 39/5. Previously 1.8 cm. Patchy areas of ground-glass and airspace density within the anterior left upper lobe is nonspecific, but favored to represent sequelae of external beam radiation. New atelectasis of the inferior lingula, image 114/5. Upper Abdomen: No acute findings within the imaged portions of the upper abdomen. Aortic atherosclerosis noted. Musculoskeletal: Spondylosis within the thoracic spine. No acute or significant osseous findings. IMPRESSION: 1. Stable appearance of treated tumor within the anteromedial left upper lobe. Adjacent area of ground-glass and airspace density is favored to represent sequelae of external beam radiation. 2. Large left pleural effusion has significantly increased in volume when compared with  10/02/2020. 3. No significant change in volume of loculated right pleural effusion, previously characterized as empyema. Volume of gas within the right pleural fluid collection has decreased in the interval. 4. As before there is significantly diminished aeration to the right lung. Similar to previous exam is diffuse pleural thickening overlying the loculated right pleural fluid collection with areas of presumed rounded atelectasis as well as segmental and subsegmental atelectasis. 5. Aortic atherosclerosis and coronary artery calcifications. Aortic Atherosclerosis (ICD10-I70.0). Electronically Signed   By: Kerby Moors M.D.   On: 01/08/2021 19:01   DG CHEST PORT 1 VIEW  Result Date: 01/05/2021 CLINICAL DATA:  Right thoracentesis, short of breath EXAM: PORTABLE CHEST 1 VIEW COMPARISON:  01/02/2021, 10/02/2020 FINDINGS: Single frontal view of the chest demonstrates slight decrease in the right pleural effusion seen previously. Underlying right lung consolidation and pleural thickening again noted and unchanged. No evidence of postprocedural pneumothorax. Fiduciary marker identified overlying the left anterior first rib at the  site of the known left upper lobe pulmonary nodule. Trace left pleural effusion versus pleural thickening identified. There are no acute bony abnormalities. Cardiac silhouette is stable. IMPRESSION: 1. Slight decrease in loculated right pleural fluid after thoracentesis. No evidence of postprocedural complication. 2. Persistent right lung consolidation and pleural thickening compatible with previous history of empyema. 3. Fiduciary marker left upper lobe compatible with known pulmonary nodule. Electronically Signed   By: Randa Ngo M.D.   On: 01/05/2021 15:49   ECHOCARDIOGRAM COMPLETE  Result Date: 01/09/2021    ECHOCARDIOGRAM REPORT   Patient Name:   Adam Briggs Date of Exam: 01/09/2021 Medical Rec #:  [id number]     Height:       70.0 in Accession #:    [id number]    Weight:       160.0 lb Date of Birth:  [birth year]     BSA:          1.898 m Patient Age:    9 years      BP:           118/68 mmHg Patient Gender: M             HR:           82 bpm. Exam Location:  Inpatient Procedure: 2D Echo, Cardiac Doppler and Color Doppler Indications:    Cardiomyopathy-Unspecified I42.9  History:        Patient has prior history of Echocardiogram examinations, most                 recent 09/20/2020. Aortic Valve Disease; Arrythmias:Atrial                 Fibrillation. Non-small cell lung cancer. Right pneumothorax.                 Acute hypoxemic respiratory failure.  Sonographer:    Darlina Sicilian RDCS Referring Phys: [phone] [facility]  Sonographer Comments: Suboptimal parasternal window, suboptimal apical window and suboptimal subcostal window. Image acquisition challenging due to respiratory motion. IMPRESSIONS  1. Largely nondiagnostic study due to poor acoustic windows for imaging. Limited findings as noted below.  2. Left ventricular ejection fraction, by estimation, is 65 to 70%. The left ventricle has normal function. Left ventricular diastolic parameters are indeterminate.  3. Large pleural effusion.  4. The  mitral valve is degenerative.  5. The aortic valve is abnormal. FINDINGS  Left Ventricle: Left ventricular ejection fraction, by estimation, is 65 to 70%. The left ventricle has normal function.  Definity contrast agent was given IV to delineate the left ventricular endocardial borders. Left ventricular diastolic parameters are indeterminate. Mitral Valve: The mitral valve is degenerative in appearance. Aortic Valve: The aortic valve is abnormal. Additional Comments: There is a large pleural effusion.  LEFT VENTRICLE PLAX 2D LVOT diam:     1.60 cm LVOT Area:     2.01 cm   AORTA Ao Root diam: 3.00 cm MITRAL VALVE MV Area (PHT): 4.30 cm     SHUNTS MV Decel Time: 176 msec     Systemic Diam: 1.60 cm MV E velocity: 109.33 cm/s Cherlynn Kaiser MD Electronically signed by Cherlynn Kaiser MD Signature Date/Time: 01/09/2021/5:29:15 PM    Final    VAS [place] LOWER EXTREMITY VENOUS (DVT)  Result Date: 01/10/2021  Lower Venous DVT Study Patient Name:  Adam Briggs  Date of Exam:   01/10/2021 Medical Rec #: [id number]      Accession #:    [id number] Date of Birth: [birth year]      Patient Gender: M Patient Age:   51 years Exam Location:  [facility] Procedure:      VAS [place] LOWER EXTREMITY VENOUS (DVT) Referring Phys: MURALI RAMASWAMY --------------------------------------------------------------------------------  Indications: Edema.  Risk Factors: None identified. Comparison Study: No prior studies. Performing Technologist: Oliver Hum RVT  Examination Guidelines: A complete evaluation includes B-mode imaging, spectral Doppler, color Doppler, and power Doppler as needed of all accessible portions of each vessel. Bilateral testing is considered an integral part of a complete examination. Limited examinations for reoccurring indications may be performed as noted. The reflux portion of the exam is performed with the patient in reverse Trendelenburg.   +---------+---------------+---------+-----------+----------+--------------+ RIGHT    CompressibilityPhasicitySpontaneityPropertiesThrombus Aging +---------+---------------+---------+-----------+----------+--------------+ CFV      Full           Yes      Yes                                 +---------+---------------+---------+-----------+----------+--------------+ SFJ      Full                                                        +---------+---------------+---------+-----------+----------+--------------+ FV Prox  Full                                                        +---------+---------------+---------+-----------+----------+--------------+ FV Mid   Full                                                        +---------+---------------+---------+-----------+----------+--------------+ FV DistalFull                                                        +---------+---------------+---------+-----------+----------+--------------+ PFV      Full                                                        +---------+---------------+---------+-----------+----------+--------------+  POP      Full           Yes      Yes                                 +---------+---------------+---------+-----------+----------+--------------+ PTV      Full                                                        +---------+---------------+---------+-----------+----------+--------------+ PERO     Full                                                        +---------+---------------+---------+-----------+----------+--------------+   +---------+---------------+---------+-----------+----------+--------------+ LEFT     CompressibilityPhasicitySpontaneityPropertiesThrombus Aging +---------+---------------+---------+-----------+----------+--------------+ CFV      Full           Yes      Yes                                  +---------+---------------+---------+-----------+----------+--------------+ SFJ      Full                                                        +---------+---------------+---------+-----------+----------+--------------+ FV Prox  Full                                                        +---------+---------------+---------+-----------+----------+--------------+ FV Mid   Full                                                        +---------+---------------+---------+-----------+----------+--------------+ FV DistalFull                                                        +---------+---------------+---------+-----------+----------+--------------+ PFV      Full                                                        +---------+---------------+---------+-----------+----------+--------------+ POP      Full           Yes      Yes                                 +---------+---------------+---------+-----------+----------+--------------+  PTV      Full                                                        +---------+---------------+---------+-----------+----------+--------------+ PERO     Full                                                        +---------+---------------+---------+-----------+----------+--------------+    Summary: RIGHT: - There is no evidence of deep vein thrombosis in the lower extremity.  - No cystic structure found in the popliteal fossa.  LEFT: - There is no evidence of deep vein thrombosis in the lower extremity.  - No cystic structure found in the popliteal fossa.  *See table(s) above for measurements and observations.    Preliminary    US Abdomen Limited RUQ (LIVER/GB)  Result Date: 01/08/2021 CLINICAL DATA:  Right pleural effusion and suspected cirrhosis. EXAM: ULTRASOUND ABDOMEN LIMITED RIGHT UPPER QUADRANT COMPARISON:  None. FINDINGS: Gallbladder: No gallstones or wall thickening visualized. No sonographic Murphy sign noted by  sonographer. Common bile duct: Diameter: 4 mm Liver: There is slight heterogeneity of the liver echogenicity with coarsened echotexture and mild surface irregularity, consistent with changes of cirrhosis. No discrete mass identified. Portal vein is patent on color Doppler imaging with normal direction of blood flow towards the liver. Other: Partially visualized right pleural effusion. IMPRESSION: 1. Cirrhosis. 2. Patent main portal vein with hepatopetal flow. 3. No ascites. 4. Right pleural effusion. Electronically Signed   By: Anner Crete M.D.   On: 01/08/2021 19:51   US THORACENTESIS ASP PLEURAL SPACE W/IMG GUIDE  Result Date: 01/10/2021 INDICATION: Patient with history of non-small cell lung cancer, left pleural effusion; request for left thoracentesis EXAM: ULTRASOUND GUIDED DIAGNOSTIC AND THERAPEUTIC LEFT THORACENTESIS MEDICATIONS: 5 ML 1% LIDOCAINE COMPLICATIONS: None immediate. PROCEDURE: An ultrasound guided thoracentesis was thoroughly discussed with the patient and questions answered. The benefits, risks, alternatives and complications were also discussed. The patient understands and wishes to proceed with the procedure. Written consent was obtained. Ultrasound was performed to localize and mark an adequate pocket of fluid in the left chest. The area was then prepped and draped in the normal sterile fashion. 1% Lidocaine was used for local anesthesia. Under ultrasound guidance a 6 Fr Safe-T-Centesis catheter was introduced. Thoracentesis was performed. The catheter was removed and a dressing applied. FINDINGS: A total of approximately 1.4 L of blood-tinged, amber fluid was removed. Samples were sent to the laboratory as requested by the clinical team. IMPRESSION: Successful ultrasound guided diagnostic and therapeutic left thoracentesis yielding 1.4 L of pleural fluid. Read by: Narda Rutherford, NP Electronically Signed   By: Lucrezia Europe M.D.   On: 01/09/2021 15:40    Lab Data:  CBC: Recent Labs   Lab 01/08/21 1256  WBC 4.9  NEUTROABS 3.9  HGB 12.1*  HCT 39.5  MCV 82.6  PLT 833   Basic Metabolic Panel: Recent Labs  Lab 01/08/21 1256 01/09/21 0452  NA 140 137  K 3.7 4.0  CL 99 96*  CO2 30 33*  GLUCOSE 152* 97  BUN 27* 27*  CREATININE 0.91 0.98  CALCIUM 8.8* 8.5*   GFR:  Estimated Creatinine Clearance: 52.5 mL/min (by C-G formula based on SCr of 0.98 mg/dL). Liver Function Tests: Recent Labs  Lab 01/08/21 1256  AST 15  ALT 16  ALKPHOS 57  BILITOT 0.8  PROT 6.7  ALBUMIN 3.4*   No results for input(s): LIPASE, AMYLASE in the last 168 hours. No results for input(s): AMMONIA in the last 168 hours. Coagulation Profile: No results for input(s): INR, PROTIME in the last 168 hours. Cardiac Enzymes: No results for input(s): CKTOTAL, CKMB, CKMBINDEX, TROPONINI in the last 168 hours. BNP (last 3 results) Recent Labs    09/15/20 1132  PROBNP 238.0*   HbA1C: No results for input(s): HGBA1C in the last 72 hours. CBG: No results for input(s): GLUCAP in the last 168 hours. Lipid Profile: No results for input(s): CHOL, HDL, LDLCALC, TRIG, CHOLHDL, LDLDIRECT in the last 72 hours. Thyroid Function Tests: No results for input(s): TSH, T4TOTAL, FREET4, T3FREE, THYROIDAB in the last 72 hours. Anemia Panel: No results for input(s): VITAMINB12, FOLATE, FERRITIN, TIBC, IRON, RETICCTPCT in the last 72 hours. Urine analysis:    Component Value Date/Time   COLORURINE YELLOW 01/09/2021 1125   APPEARANCEUR CLEAR 01/09/2021 1125   LABSPEC 1.010 01/09/2021 1125   PHURINE 5.0 01/09/2021 1125   GLUCOSEU NEGATIVE 01/09/2021 1125   GLUCOSEU NEGATIVE 09/23/2011 1032   HGBUR NEGATIVE 01/09/2021 1125   HGBUR negative 09/26/2008 0908   BILIRUBINUR NEGATIVE 01/09/2021 1125   KETONESUR NEGATIVE 01/09/2021 1125   PROTEINUR 30 (A) 01/09/2021 1125   UROBILINOGEN 1.0 01/16/2012 1008   NITRITE NEGATIVE 01/09/2021 1125   LEUKOCYTESUR NEGATIVE 01/09/2021 1125     Adam Briggs  M.D. [facility] 01/10/2021, 1:37 PM  Available via Epic secure chat 7am-7pm After 7 pm, please refer to night coverage provider listed on amion.

## 2021-01-10 NOTE — Progress Notes (Signed)
Bilateral lower extremity venous duplex has been completed. Preliminary results can be found in CV Proc through chart review.   01/10/21 11:04 AM Adam Briggs RVT

## 2021-01-11 DIAGNOSIS — I480 Paroxysmal atrial fibrillation: Secondary | ICD-10-CM | POA: Diagnosis not present

## 2021-01-11 DIAGNOSIS — K7460 Unspecified cirrhosis of liver: Secondary | ICD-10-CM | POA: Diagnosis not present

## 2021-01-11 DIAGNOSIS — I350 Nonrheumatic aortic (valve) stenosis: Secondary | ICD-10-CM | POA: Diagnosis not present

## 2021-01-11 DIAGNOSIS — J9601 Acute respiratory failure with hypoxia: Secondary | ICD-10-CM | POA: Diagnosis not present

## 2021-01-11 DIAGNOSIS — R932 Abnormal findings on diagnostic imaging of liver and biliary tract: Secondary | ICD-10-CM | POA: Diagnosis not present

## 2021-01-11 DIAGNOSIS — M7989 Other specified soft tissue disorders: Secondary | ICD-10-CM

## 2021-01-11 DIAGNOSIS — R0600 Dyspnea, unspecified: Secondary | ICD-10-CM | POA: Diagnosis not present

## 2021-01-11 DIAGNOSIS — I4819 Other persistent atrial fibrillation: Secondary | ICD-10-CM

## 2021-01-11 DIAGNOSIS — J9383 Other pneumothorax: Secondary | ICD-10-CM | POA: Diagnosis not present

## 2021-01-11 DIAGNOSIS — I4811 Longstanding persistent atrial fibrillation: Secondary | ICD-10-CM

## 2021-01-11 DIAGNOSIS — J90 Pleural effusion, not elsewhere classified: Secondary | ICD-10-CM | POA: Diagnosis not present

## 2021-01-11 LAB — CBC
HCT: 39.8 % (ref 39.0–52.0)
Hemoglobin: 11.7 g/dL — ABNORMAL LOW (ref 13.0–17.0)
MCH: 25.2 pg — ABNORMAL LOW (ref 26.0–34.0)
MCHC: 29.4 g/dL — ABNORMAL LOW (ref 30.0–36.0)
MCV: 85.6 fL (ref 80.0–100.0)
Platelets: 214 10*3/uL (ref 150–400)
RBC: 4.65 MIL/uL (ref 4.22–5.81)
RDW: 15.4 % (ref 11.5–15.5)
WBC: 6 10*3/uL (ref 4.0–10.5)
nRBC: 0 % (ref 0.0–0.2)

## 2021-01-11 LAB — APTT: aPTT: 62 seconds — ABNORMAL HIGH (ref 24–36)

## 2021-01-11 LAB — AMYLASE, PLEURAL OR PERITONEAL FLUID: Amylase, Fluid: 14 U/L

## 2021-01-11 LAB — CYTOLOGY - NON PAP

## 2021-01-11 LAB — GRAM STAIN

## 2021-01-11 LAB — ANTI-DNA ANTIBODY, DOUBLE-STRANDED: ds DNA Ab: <1 IU/mL (ref 0–9)

## 2021-01-11 LAB — HEPARIN LEVEL (UNFRACTIONATED): Heparin Unfractionated: 0.32 IU/mL (ref 0.30–0.70)

## 2021-01-11 LAB — RHEUMATOID FACTOR: Rheumatoid fact SerPl-aCnc: <10 IU/mL (ref <14.0)

## 2021-01-11 MED ORDER — FUROSEMIDE 10 MG/ML IJ SOLN
20.0000 mg | Freq: Two times a day (BID) | INTRAMUSCULAR | Status: DC
  Administered 2021-01-11 – 2021-01-16 (×11): 20 mg via INTRAVENOUS
  Filled 2021-01-11 (×10): qty 2

## 2021-01-11 MED ORDER — POLYETHYLENE GLYCOL 3350 17 G PO PACK
17.0000 g | PACK | Freq: Two times a day (BID) | ORAL | Status: DC
  Administered 2021-01-11 – 2021-01-16 (×8): 17 g via ORAL
  Filled 2021-01-11 (×9): qty 1

## 2021-01-11 MED ORDER — HEPARIN (PORCINE) 25000 UT/250ML-% IV SOLN
1150.0000 [IU]/h | INTRAVENOUS | Status: AC
  Administered 2021-01-11: 1100 [IU]/h via INTRAVENOUS
  Administered 2021-01-12: 1150 [IU]/h via INTRAVENOUS
  Filled 2021-01-11 (×2): qty 250

## 2021-01-11 NOTE — Progress Notes (Signed)
[facility]                                                                              Patient Demographics  Adam Briggs, is a 87 y.o. male, DOB - 06/25/1929, JOI:[id number]  Admit date - 01/08/2021   Admitting Physician Adam Cota, MD  Outpatient Primary MD for the patient is Adam Koch, MD  Outpatient specialists:   LOS - 2  days   Medical records reviewed and are as summarized below:    Chief Complaint  Patient presents with   Shortness of Breath       Brief summary   87 year old male with a history of NSCLC left upper lobe, completed XRT, recurrent right-sided loculated hydropneumothorax, underwent outpatient left thoracentesis for rec pleural effusion per PCCM on 8/19 with removal of 500 cc.  Patient was admitted for acute hypoxic respiratory failure secondary to recurrent increasing loculated pleural effusion.    Assessment & Plan    New worsening left pleural effusion, acute hypoxic respiratory failure loculated right lateral pleural effusion, recurrent -Seen on CT 8/22, underwent left thoracentesis on 8/23 (also underwent thoracentesis right on 8/24) -Removed 1.4 L of blood-tinged fluid from left thoracentesis -Improving, was requiring 5 L [place], currently O2 sats 97% on room air -Currently on Lasix 20 mg IV daily, BP soft, not much room for titrating up, negative balance of 966 cc --Daughter at the bedside, frustrated with not able to find etiology for the recurrent pleural effusions, transudative (patient has NSCLC malignant neoplasm of the LUL lung, completed XRT 11/23/2020, radonc Dr. Lisbeth Briggs, primary pulmonologist, Dr. Valeta Briggs) - Cytology 8/23 showed reactive mesothelial cells, numerous lymphoid cells  Loculated right lateral pleural effusion, recurrent -Since April/May 2022, nondiagnostic cytology in 08/2020 and 01/05/2021, loculated and persistent -Underwent thoracentesis right lung on 8/24 -Pulmonology following, appreciate  recommendations -Cardiology also consulted due to recurrent pleural effusions, transudative, ?  Cardiac etiology  Cirrhosis, noted on RUQ [place]: New diagnosis -2D echo showed EF of 65 to 70%, aortic stenosis.  Previously has been recommended TEE, doubt able to do TEE with his respiratory status -24-hour urine protein normal -Ultrasound showed no ascites, patent main portal vein, no portal hypertension, ?  Passive congestion. LFTs, alk phos, bilirubin normal on 8/22, hepatitis panel negative -Has followed [facility], will consult  Possible aortic stenosis on echo 01/09/2021 -Previously has been recommended TEE -Patient followed by Dr. Percival Briggs, office note on 11/03/2020 had noted moderate aortic stenosis and repeat echo in 1 year.  No acute concerns were noted. -Will await cardiology recommendations   Persistent atrial fibrillation -Heart rate controlled, continue metoprolol 25 twice daily -Eliquis has been on hold, will place on IV heparin  BPH -Continue tamsulosin  History of NSCLC -Outpatient follow-up with pulmonology, radiation oncology, oncology  Essential hypertension -Continue Lasix, beta-blocker  Hyperlipidemia -Continue statin  Estimated body mass index is 22.96 kg/m as calculated from the following:   Height as of this encounter: $RemoveBeforeD'5\' 10"'[id number]$  (1.778 m).   Weight as of this encounter: 72.6 kg.  Code Status: Full code DVT Prophylaxis:  SCDs Start: 01/08/21 1749   Level of Care: Level of  care: Med-Surg Family Communication: Discussed all imaging results, lab results, explained to the patient and daughter at the bedside.  Daughter frustrated due to recurrent nature of the pleural effusion and unable to determine etiology.  She is wondering if patient can be transferred to tertiary care to figure out the cause of the recurrent pleural effusions.    Disposition Plan:     Status is: Inpatient  Remains inpatient appropriate because:Inpatient level of care appropriate due to severity  of illness  Dispo: The patient is from: Home              Anticipated d/c is to: Home              Patient currently is not medically stable to d/c.   Difficult to place patient No      Time Spent in minutes 35 minutes  Procedures:  Thoracentesis left, 1.4 L removed on 8/23 Ultrasound thoracentesis, right 8/24 with 520 cc removed  Consultants:   Pulmonology Cardiology GI  Antimicrobials:   Anti-infectives (From admission, onward)    None          Medications  Scheduled Meds:  furosemide  20 mg Intravenous Daily   metoprolol tartrate  25 mg Oral BID   senna-docusate  1 tablet Oral QHS   simvastatin  20 mg Oral q1800   sodium chloride flush  3 mL Intravenous Q12H   tamsulosin  0.4 mg Oral q AM   Continuous Infusions:  sodium chloride     heparin 1,100 Units/hr (01/11/21 0753)   PRN Meds:.sodium chloride, acetaminophen **OR** acetaminophen, alum & mag hydroxide-simeth, fluticasone, levalbuterol, ondansetron **OR** ondansetron (ZOFRAN) IV, polyethylene glycol, sodium chloride flush      Subjective:   Adam Briggs was seen and examined today.  No complaints per the patient, no acute chest pain or any worsening shortness of breath.  No fevers or chills.  No acute events overnight.   Objective:   Vitals:   01/10/21 1300 01/10/21 2032 01/11/21 0439 01/11/21 1300  BP: 106/61 129/83 104/64 107/60  Pulse: 83 81 69 97  Resp: (!) 25 19 20  (!) 22  Temp: 98.5 F (36.9 C) 98 F (36.7 C) 97.8 F (36.6 C) 97.6 F (36.4 C)  TempSrc: Oral   Oral  SpO2: 99% 98% 100% 97%  Weight:      Height:        Intake/Output Summary (Last 24 hours) at 01/11/2021 1429 Last data filed at 01/11/2021 0900 Gross per 24 hour  Intake --  Output 700 ml  Net -700 ml     Wt Readings from Last 3 Encounters:  01/08/21 72.6 kg  11/27/20 74.1 kg  11/03/20 73.8 kg   Physical Exam General: Alert and oriented x 3, NAD, frail and ill-appearing Cardiovascular: Irregularly  irregular Respiratory: Diminished breath sound at the bases Gastrointestinal: Soft, nontender, nondistended, NBS Ext: 1+ pedal edema bilaterally Neuro: no new deficits Skin: No rashes Psych: Normal affect and demeanor, alert and oriented x3     Data Reviewed:  I have personally reviewed following labs and imaging studies  Micro Results Recent Results (from the past 240 hour(s))  Resp Panel by RT-PCR (Flu A&B, Covid) Nasopharyngeal Swab     Status: None   Collection Time: 01/08/21  1:02 PM   Specimen: Nasopharyngeal Swab; Nasopharyngeal(NP) swabs in vial transport medium  Result Value Ref Range Status   SARS Coronavirus 2 by RT PCR NEGATIVE NEGATIVE Final    Comment: (NOTE) SARS-CoV-2 target nucleic acids  are NOT DETECTED.  The SARS-CoV-2 RNA is generally detectable in upper respiratory specimens during the acute phase of infection. The lowest concentration of SARS-CoV-2 viral copies this assay can detect is 138 copies/mL. A negative result does not preclude SARS-Cov-2 infection and should not be used as the sole basis for treatment or other patient management decisions. A negative result may occur with  improper specimen collection/handling, submission of specimen other than nasopharyngeal swab, presence of viral mutation(s) within the areas targeted by this assay, and inadequate number of viral copies(<138 copies/mL). A negative result must be combined with clinical observations, patient history, and epidemiological information. The expected result is Negative.  Fact Sheet for Patients:  EntrepreneurPulse.com.au  Fact Sheet for Healthcare Providers:  IncredibleEmployment.be  This test is no t yet approved or cleared by the [place] FDA and  has been authorized for detection and/or diagnosis of SARS-CoV-2 by FDA under an Emergency Use Authorization (EUA). This EUA will remain  in effect (meaning this test can be used) for the duration  of the COVID-19 declaration under Section 564(b)(1) of the Act, 21 U.S.C.section 360bbb-3(b)(1), unless the authorization is terminated  or revoked sooner.       Influenza A by PCR NEGATIVE NEGATIVE Final   Influenza B by PCR NEGATIVE NEGATIVE Final    Comment: (NOTE) The Xpert Xpress SARS-CoV-2/FLU/RSV plus assay is intended as an aid in the diagnosis of influenza from Nasopharyngeal swab specimens and should not be used as a sole basis for treatment. Nasal washings and aspirates are unacceptable for Xpert Xpress SARS-CoV-2/FLU/RSV testing.  Fact Sheet for Patients: EntrepreneurPulse.com.au  Fact Sheet for Healthcare Providers: IncredibleEmployment.be  This test is not yet approved or cleared by the [place] FDA and has been authorized for detection and/or diagnosis of SARS-CoV-2 by FDA under an Emergency Use Authorization (EUA). This EUA will remain in effect (meaning this test can be used) for the duration of the COVID-19 declaration under Section 564(b)(1) of the Act, 21 U.S.C. section 360bbb-3(b)(1), unless the authorization is terminated or revoked.  Performed at [facility], [place] [address] [place], [place] [address]   Body fluid culture w Gram Stain     Status: None (Preliminary result)   Collection Time: 01/09/21  3:43 PM   Specimen: PATH Cytology Pleural fluid  Result Value Ref Range Status   Specimen Description   Final    PLEURAL Performed at [place] [facility] [address], [place], [place] [address]    Special Requests   Final    NONE Performed at [facility], [place] [address], [place], [place] [address]    Gram Stain   Final    RARE WBC PRESENT,BOTH PMN AND MONONUCLEAR NO ORGANISMS SEEN    Culture   Final    NO GROWTH 2 DAYS Performed at [facility], [place] [address], [place], [place] [address]    Report Status PENDING  Incomplete  Culture, body fluid  w Gram Stain-bottle     Status: None (Preliminary result)   Collection Time: 01/10/21 12:21 PM   Specimen: Fluid  Result Value Ref Range Status   Specimen Description FLUID PLEURAL  Final   Special Requests BOTTLES DRAWN AEROBIC AND ANAEROBIC  Final   Culture   Final    NO GROWTH < 12 HOURS Performed at [facility], [place] [address], [place],  [address]    Report Status PENDING  Incomplete  Gram stain     Status:  None   Collection Time: 01/10/21 12:21 PM   Specimen: Fluid  Result Value Ref Range Status   Specimen Description FLUID PLEURAL  Final   Special Requests NONE  Final   Gram Stain   Final    FEW SQUAMOUS EPITHELIAL CELLS PRESENT MODERATE WBC PRESENT, PREDOMINANTLY MONONUCLEAR NO ORGANISMS SEEN Performed at [facility], [place] [address] [place], [place] [address]    Report Status 01/11/2021 FINAL  Final    Radiology Reports DG Chest 1 View  Result Date: 01/10/2021 CLINICAL DATA:  Status post right thoracentesis. EXAM: CHEST  1 VIEW COMPARISON:  01/09/2021 FINDINGS: Large loculated right pleural effusion is again identified. There has been slight decrease in volume of the right pleural effusion with mildly improved aeration to the right mid lung. No definite pneumothorax identified. Small left pleural effusion appears unchanged. IMPRESSION: 1. Slight decrease in volume of large loculated right pleural effusion status post thoracentesis. 2. No pneumothorax. Electronically Signed   By: Kerby Moors M.D.   On: 01/10/2021 13:02   DG Chest 1 View  Result Date: 01/09/2021 CLINICAL DATA:  Status post left thoracentesis EXAM: CHEST  1 VIEW COMPARISON:  01/08/2021 FINDINGS: Single frontal view of the chest demonstrates near complete resolution of the left pleural effusion after interval left-sided thoracentesis. No evidence of left-sided pneumothorax. Minimal consolidation at the left lung base consistent with residual atelectasis. There is persistent loculated right  pleural effusion consistent with known empyema. Multifocal consolidation throughout the right lung is stable. Cardiac silhouette is unchanged. No acute bony abnormalities. IMPRESSION: 1. No complication after left-sided thoracentesis. Near complete resolution of left pleural effusion. 2. Stable known right empyema and underlying right lung consolidation. Electronically Signed   By: Randa Ngo M.D.   On: 01/09/2021 15:57   DG Chest 2 View  Result Date: 01/08/2021 CLINICAL DATA:  Increased shortness of breath EXAM: CHEST - 2 VIEW COMPARISON:  Chest x-ray dated January 05, 2021 FINDINGS: Visualized cardiac and mediastinal contours are unchanged. Increased size of moderate to large loculated right pleural effusion. Persistent right lung consolidation. Small left pleural effusion. No pneumothorax. IMPRESSION: Increased size of moderate to large loculated right pleural effusion when compared with most recent prior x-ray. Increased size of small left pleural effusion. Electronically Signed   By: Yetta Glassman M.D.   On: 01/08/2021 13:56   DG Chest 2 View  Result Date: 01/02/2021 CLINICAL DATA:  Dyspnea. EXAM: CHEST - 2 VIEW COMPARISON:  November 27, 2020. FINDINGS: Stable cardiomediastinal silhouette. No definite pneumothorax is noted. Stable large probable loculated right pleural effusion is noted extending from the apex to the base along the lateral wall. Atelectasis or infiltrates are noted in the aerated portions of the right lung. Minimal left basilar subsegmental atelectasis is noted. Bony thorax is unremarkable. IMPRESSION: Stable large loculated right pleural effusion is noted. Atelectasis or infiltrates are noted in the aerated portions of the right lung. Minimal left basilar subsegmental atelectasis. Electronically Signed   By: Marijo Conception M.D.   On: 01/02/2021 16:53   CT CHEST WO CONTRAST  Result Date: 01/08/2021 CLINICAL DATA:  Non-small cell lung cancer. Status post radiotherapy. Recurrent  right-sided loculated hydropneumothorax. EXAM: CT CHEST WITHOUT CONTRAST TECHNIQUE: Multidetector CT imaging of the chest was performed following the standard protocol without IV contrast. COMPARISON:  10/02/2020 FINDINGS: Cardiovascular: Moderate cardiac enlargement. No pericardial effusion identified. Aortic atherosclerosis. Coronary artery calcifications. Mediastinum/Nodes: Normal appearance of the thyroid gland. The trachea appears patent and is midline. Normal appearance  of the esophagus. No enlarged axillary lymph nodes. Multiple prominent mediastinal lymph nodes are again identified. None of these meet CT criteria for adenopathy. Index nodes include: -left pre-vascular lymph node measures 1.1 cm, image 58/2. Unchanged from previous exam. Right paratracheal lymph node measures 1.1 cm, image 46/2. Previously 0.8 cm. Hilar lymph nodes are suboptimally evaluated due to lack of IV contrast material. Lungs/Pleura: Moderate loculated right pleural effusion is again noted and does not appear significantly improved in volume when compared with 10/02/2020. Decrease gas within this fluid collection noted compared with 10/02/2020. Significantly diminished aeration to the right lung. Overlying areas of pleural thickening and presumed rounded atelectasis noted within the right middle lobe and right lower lobe. Multiple areas of subsegmental atelectasis also noted within the aerated portions of the right lung. Moderate to large left pleural effusion has increased in volume from the previous exam. Treated tumor within the anteromedial left upper lobe measures 1.7 cm, image 39/5. Previously 1.8 cm. Patchy areas of ground-glass and airspace density within the anterior left upper lobe is nonspecific, but favored to represent sequelae of external beam radiation. New atelectasis of the inferior lingula, image 114/5. Upper Abdomen: No acute findings within the imaged portions of the upper abdomen. Aortic atherosclerosis noted.  Musculoskeletal: Spondylosis within the thoracic spine. No acute or significant osseous findings. IMPRESSION: 1. Stable appearance of treated tumor within the anteromedial left upper lobe. Adjacent area of ground-glass and airspace density is favored to represent sequelae of external beam radiation. 2. Large left pleural effusion has significantly increased in volume when compared with 10/02/2020. 3. No significant change in volume of loculated right pleural effusion, previously characterized as empyema. Volume of gas within the right pleural fluid collection has decreased in the interval. 4. As before there is significantly diminished aeration to the right lung. Similar to previous exam is diffuse pleural thickening overlying the loculated right pleural fluid collection with areas of presumed rounded atelectasis as well as segmental and subsegmental atelectasis. 5. Aortic atherosclerosis and coronary artery calcifications. Aortic Atherosclerosis (ICD10-I70.0). Electronically Signed   By: Kerby Moors M.D.   On: 01/08/2021 19:01   DG CHEST PORT 1 VIEW  Result Date: 01/05/2021 CLINICAL DATA:  Right thoracentesis, short of breath EXAM: PORTABLE CHEST 1 VIEW COMPARISON:  01/02/2021, 10/02/2020 FINDINGS: Single frontal view of the chest demonstrates slight decrease in the right pleural effusion seen previously. Underlying right lung consolidation and pleural thickening again noted and unchanged. No evidence of postprocedural pneumothorax. Fiduciary marker identified overlying the left anterior first rib at the site of the known left upper lobe pulmonary nodule. Trace left pleural effusion versus pleural thickening identified. There are no acute bony abnormalities. Cardiac silhouette is stable. IMPRESSION: 1. Slight decrease in loculated right pleural fluid after thoracentesis. No evidence of postprocedural complication. 2. Persistent right lung consolidation and pleural thickening compatible with previous history  of empyema. 3. Fiduciary marker left upper lobe compatible with known pulmonary nodule. Electronically Signed   By: Randa Ngo M.D.   On: 01/05/2021 15:49   ECHOCARDIOGRAM COMPLETE  Result Date: 01/09/2021    ECHOCARDIOGRAM REPORT   Patient Name:   DARL KUSS Date of Exam: 01/09/2021 Medical Rec #:  [id number]     Height:       70.0 in Accession #:    [id number]    Weight:       160.0 lb Date of Birth:  08-01-1929     BSA:  1.898 m Patient Age:    60 years      BP:           118/68 mmHg Patient Gender: M             HR:           82 bpm. Exam Location:  Inpatient Procedure: 2D Echo, Cardiac Doppler and Color Doppler Indications:    Cardiomyopathy-Unspecified I42.9  History:        Patient has prior history of Echocardiogram examinations, most                 recent 09/20/2020. Aortic Valve Disease; Arrythmias:Atrial                 Fibrillation. Non-small cell lung cancer. Right pneumothorax.                 Acute hypoxemic respiratory failure.  Sonographer:    Darlina Sicilian RDCS Referring Phys: [phone] [facility]  Sonographer Comments: Suboptimal parasternal window, suboptimal apical window and suboptimal subcostal window. Image acquisition challenging due to respiratory motion. IMPRESSIONS  1. Largely nondiagnostic study due to poor acoustic windows for imaging. Limited findings as noted below.  2. Left ventricular ejection fraction, by estimation, is 65 to 70%. The left ventricle has normal function. Left ventricular diastolic parameters are indeterminate.  3. Large pleural effusion.  4. The mitral valve is degenerative.  5. The aortic valve is abnormal. FINDINGS  Left Ventricle: Left ventricular ejection fraction, by estimation, is 65 to 70%. The left ventricle has normal function. Definity contrast agent was given IV to delineate the left ventricular endocardial borders. Left ventricular diastolic parameters are indeterminate. Mitral Valve: The mitral valve is degenerative in appearance.  Aortic Valve: The aortic valve is abnormal. Additional Comments: There is a large pleural effusion.  LEFT VENTRICLE PLAX 2D LVOT diam:     1.60 cm LVOT Area:     2.01 cm   AORTA Ao Root diam: 3.00 cm MITRAL VALVE MV Area (PHT): 4.30 cm     SHUNTS MV Decel Time: 176 msec     Systemic Diam: 1.60 cm MV E velocity: 109.33 cm/s Cherlynn Kaiser MD Electronically signed by Cherlynn Kaiser MD Signature Date/Time: 01/09/2021/5:29:15 PM    Final    VAS [place] LOWER EXTREMITY VENOUS (DVT)  Result Date: 01/10/2021  Lower Venous DVT Study Patient Name:  DAVINCI GLOTFELTY  Date of Exam:   01/10/2021 Medical Rec #: [id number]      Accession #:    [id number] Date of Birth: 16-Apr-1930      Patient Gender: M Patient Age:   66 years Exam Location:  [facility] Procedure:      VAS [place] LOWER EXTREMITY VENOUS (DVT) Referring Phys: MURALI RAMASWAMY --------------------------------------------------------------------------------  Indications: Edema.  Risk Factors: None identified. Comparison Study: No prior studies. Performing Technologist: Oliver Hum RVT  Examination Guidelines: A complete evaluation includes B-mode imaging, spectral Doppler, color Doppler, and power Doppler as needed of all accessible portions of each vessel. Bilateral testing is considered an integral part of a complete examination. Limited examinations for reoccurring indications may be performed as noted. The reflux portion of the exam is performed with the patient in reverse Trendelenburg.  +---------+---------------+---------+-----------+----------+--------------+ RIGHT    CompressibilityPhasicitySpontaneityPropertiesThrombus Aging +---------+---------------+---------+-----------+----------+--------------+ CFV      Full           Yes      Yes                                 +---------+---------------+---------+-----------+----------+--------------+  SFJ      Full                                                         +---------+---------------+---------+-----------+----------+--------------+ FV Prox  Full                                                        +---------+---------------+---------+-----------+----------+--------------+ FV Mid   Full                                                        +---------+---------------+---------+-----------+----------+--------------+ FV DistalFull                                                        +---------+---------------+---------+-----------+----------+--------------+ PFV      Full                                                        +---------+---------------+---------+-----------+----------+--------------+ POP      Full           Yes      Yes                                 +---------+---------------+---------+-----------+----------+--------------+ PTV      Full                                                        +---------+---------------+---------+-----------+----------+--------------+ PERO     Full                                                        +---------+---------------+---------+-----------+----------+--------------+   +---------+---------------+---------+-----------+----------+--------------+ LEFT     CompressibilityPhasicitySpontaneityPropertiesThrombus Aging +---------+---------------+---------+-----------+----------+--------------+ CFV      Full           Yes      Yes                                 +---------+---------------+---------+-----------+----------+--------------+ SFJ      Full                                                        +---------+---------------+---------+-----------+----------+--------------+  FV Prox  Full                                                        +---------+---------------+---------+-----------+----------+--------------+ FV Mid   Full                                                         +---------+---------------+---------+-----------+----------+--------------+ FV DistalFull                                                        +---------+---------------+---------+-----------+----------+--------------+ PFV      Full                                                        +---------+---------------+---------+-----------+----------+--------------+ POP      Full           Yes      Yes                                 +---------+---------------+---------+-----------+----------+--------------+ PTV      Full                                                        +---------+---------------+---------+-----------+----------+--------------+ PERO     Full                                                        +---------+---------------+---------+-----------+----------+--------------+     Summary: RIGHT: - There is no evidence of deep vein thrombosis in the lower extremity.  - No cystic structure found in the popliteal fossa.  LEFT: - There is no evidence of deep vein thrombosis in the lower extremity.  - No cystic structure found in the popliteal fossa.  *See table(s) above for measurements and observations. Electronically signed by Harold Barban MD on 01/10/2021 at 8:06:44 PM.    Final    US Abdomen Limited RUQ (LIVER/GB)  Result Date: 01/08/2021 CLINICAL DATA:  Right pleural effusion and suspected cirrhosis. EXAM: ULTRASOUND ABDOMEN LIMITED RIGHT UPPER QUADRANT COMPARISON:  None. FINDINGS: Gallbladder: No gallstones or wall thickening visualized. No sonographic Murphy sign noted by sonographer. Common bile duct: Diameter: 4 mm Liver: There is slight heterogeneity of the liver echogenicity with coarsened echotexture and mild surface irregularity, consistent with changes of cirrhosis. No discrete mass identified. Portal vein is patent on color Doppler imaging with normal direction of blood flow towards the liver. Other: Partially visualized right  pleural effusion.  IMPRESSION: 1. Cirrhosis. 2. Patent main portal vein with hepatopetal flow. 3. No ascites. 4. Right pleural effusion. Electronically Signed   By: Anner Crete M.D.   On: 01/08/2021 19:51   US THORACENTESIS ASP PLEURAL SPACE W/IMG GUIDE  Result Date: 01/10/2021 INDICATION: Patient with history of lung and prostate cancers, bilateral pleural effusions, cirrhosis by imaging; request received for diagnostic and therapeutic right thoracentesis. EXAM: ULTRASOUND GUIDED DIAGNOSTIC AND THERAPEUTIC RIGHT THORACENTESIS MEDICATIONS: 1% lidocaine to skin and subcutaneous tissue COMPLICATIONS: None immediate. PROCEDURE: An ultrasound guided thoracentesis was thoroughly discussed with the patient/daughter and questions answered. The benefits, risks, alternatives and complications were also discussed. The patient understands and wishes to proceed with the procedure. Written consent was obtained. Ultrasound was performed to localize and mark an adequate pocket of fluid in the right chest. The area was then prepped and draped in the normal sterile fashion. 1% Lidocaine was used for local anesthesia. Under ultrasound guidance a 6 Fr Safe-T-Centesis catheter was introduced. Thoracentesis was performed. The catheter was removed and a dressing applied. FINDINGS: A total of approximately 520 cc of amber/blood-tinged fluid was removed. Samples were sent to the laboratory as requested by the clinical team. Due to patient chest discomfort only the above amount of fluid was removed today. IMPRESSION: Successful ultrasound guided diagnostic and therapeutic right thoracentesis yielding 520 cc of pleural fluid. Read by: Rowe Robert, PA-C Electronically Signed   By: Markus Daft M.D.   On: 01/10/2021 12:41   US THORACENTESIS ASP PLEURAL SPACE W/IMG GUIDE  Result Date: 01/10/2021 INDICATION: Patient with history of non-small cell lung cancer, left pleural effusion; request for left thoracentesis EXAM: ULTRASOUND GUIDED DIAGNOSTIC AND  THERAPEUTIC LEFT THORACENTESIS MEDICATIONS: 5 ML 1% LIDOCAINE COMPLICATIONS: None immediate. PROCEDURE: An ultrasound guided thoracentesis was thoroughly discussed with the patient and questions answered. The benefits, risks, alternatives and complications were also discussed. The patient understands and wishes to proceed with the procedure. Written consent was obtained. Ultrasound was performed to localize and mark an adequate pocket of fluid in the left chest. The area was then prepped and draped in the normal sterile fashion. 1% Lidocaine was used for local anesthesia. Under ultrasound guidance a 6 Fr Safe-T-Centesis catheter was introduced. Thoracentesis was performed. The catheter was removed and a dressing applied. FINDINGS: A total of approximately 1.4 L of blood-tinged, amber fluid was removed. Samples were sent to the laboratory as requested by the clinical team. IMPRESSION: Successful ultrasound guided diagnostic and therapeutic left thoracentesis yielding 1.4 L of pleural fluid. Read by: Narda Rutherford, NP Electronically Signed   By: Lucrezia Europe M.D.   On: 01/09/2021 15:40    Lab Data:  CBC: Recent Labs  Lab 01/08/21 1256 01/10/21 1335 01/11/21 0448  WBC 4.9 6.6 6.0  NEUTROABS 3.9  --   --   HGB 12.1* 11.4* 11.7*  HCT 39.5 38.4* 39.8  MCV 82.6 85.7 85.6  PLT 199 211 824   Basic Metabolic Panel: Recent Labs  Lab 01/08/21 1256 01/09/21 0452  NA 140 137  K 3.7 4.0  CL 99 96*  CO2 30 33*  GLUCOSE 152* 97  BUN 27* 27*  CREATININE 0.91 0.98  CALCIUM 8.8* 8.5*   GFR: Estimated Creatinine Clearance: 52.5 mL/min (by C-G formula based on SCr of 0.98 mg/dL). Liver Function Tests: Recent Labs  Lab 01/08/21 1256  AST 15  ALT 16  ALKPHOS 57  BILITOT 0.8  PROT 6.7  ALBUMIN 3.4*   No results for input(s): LIPASE,  AMYLASE in the last 168 hours. No results for input(s): AMMONIA in the last 168 hours. Coagulation Profile: No results for input(s): INR, PROTIME in the last 168  hours. Cardiac Enzymes: No results for input(s): CKTOTAL, CKMB, CKMBINDEX, TROPONINI in the last 168 hours. BNP (last 3 results) Recent Labs    09/15/20 1132  PROBNP 238.0*   HbA1C: No results for input(s): HGBA1C in the last 72 hours. CBG: No results for input(s): GLUCAP in the last 168 hours. Lipid Profile: No results for input(s): CHOL, HDL, LDLCALC, TRIG, CHOLHDL, LDLDIRECT in the last 72 hours. Thyroid Function Tests: No results for input(s): TSH, T4TOTAL, FREET4, T3FREE, THYROIDAB in the last 72 hours. Anemia Panel: No results for input(s): VITAMINB12, FOLATE, FERRITIN, TIBC, IRON, RETICCTPCT in the last 72 hours. Urine analysis:    Component Value Date/Time   COLORURINE YELLOW 01/09/2021 1125   APPEARANCEUR CLEAR 01/09/2021 1125   LABSPEC 1.010 01/09/2021 1125   PHURINE 5.0 01/09/2021 1125   GLUCOSEU NEGATIVE 01/09/2021 1125   GLUCOSEU NEGATIVE 09/23/2011 1032   HGBUR NEGATIVE 01/09/2021 1125   HGBUR negative 09/26/2008 0908   BILIRUBINUR NEGATIVE 01/09/2021 1125   KETONESUR NEGATIVE 01/09/2021 1125   PROTEINUR 30 (A) 01/09/2021 1125   UROBILINOGEN 1.0 01/16/2012 1008   NITRITE NEGATIVE 01/09/2021 1125   LEUKOCYTESUR NEGATIVE 01/09/2021 1125     Karsynn Deweese M.D. [facility] 01/11/2021, 2:29 PM  Available via Epic secure chat 7am-7pm After 7 pm, please refer to night coverage provider listed on amion.

## 2021-01-11 NOTE — Progress Notes (Signed)
[place] for heparin Indication: atrial fibrillation; apixaban on hold  Allergies  Allergen Reactions   Lisinopril Rash    Patient Measurements: Height: 5\' 10"  (177.8 cm) Weight: 72.6 kg (160 lb) IBW/kg (Calculated) : 73 Heparin Dosing Weight: n/a. Use TBW = 73 kg  Vital Signs: Temp: 97.6 F (36.4 C) (08/25 1300) Temp Source: Oral (08/25 1300) BP: 107/60 (08/25 1300) Pulse Rate: 97 (08/25 1300)  Labs: Recent Labs    01/09/21 0452 01/10/21 1335 01/11/21 0448 01/11/21 1543  HGB  --  11.4* 11.7*  --   HCT  --  38.4* 39.8  --   PLT  --  211 214  --   APTT  --  30  --  62*  HEPARINUNFRC  --  0.44  --  0.32  CREATININE 0.98  --   --   --      Estimated Creatinine Clearance: 52.5 mL/min (by C-G formula based on SCr of 0.98 mg/dL).   Medical History: Past Medical History:  Diagnosis Date   Atrial fibrillation ([place])    BENIGN PROSTATIC HYPERTROPHY    Cataract    DIVERTICULOSIS, COLON    DM    not diabetic-has been off metformin for about 2 years as of 09/2020 pr daughter   GERD    Heart murmur    Hernia    HYPERCHOLESTEROLEMIA    HYPERTENSION    Lung cancer ([place])    Moderate aortic stenosis 10/30/2011   echo 2013   Prostate cancer ([place])    s/p seed implant   Recurrent right pleural effusion     Medications: Apixaban 5 mg PO BID PTA for atrial fibrillation -Last dose: 8/22 @ 8:00 AM   Assessment: Pt is an 86 yoM prescribed apixaban PTA for atrial fibrillation. PMH significant for pleural effusion, s/p thoracentesis on 8/19 PTA. Apixaban held on admission in anticipation for possible procedures. Pt underwent thoracentesis again on 8/23 & 8/24.   Pharmacy consulted to dose IV heparin for anticoagulation while DOAC on hold.   Significant Events: -8/23: Thoracentesis (1.4 L blood tinged, amber fluid) -8/24: Thoracentesis (520 mL amber/blood-tinged fluid)  Today, 01/11/21 CBC: Hgb slightly low but stable; Plt WNL &  stable Baseline labs collected 8/24: HL was falsely elevated due to recent DOAC First aPTT on heparin slightly low on 1100 units/hr; heparin level borderline low but still trending down, so unable to r/o residual effects of DOAC No bleeding or infusion issues per RN  Discussed anticoagulation with [facility] - ok to initiate heparin this morning. CBC stable post-procedures.   Goal of Therapy:  Heparin level 0.3-0.7 units/ml Monitor platelets by anticoagulation protocol: Yes   Plan:  Increase heparin infusion to 1150 units/hr Confirmatory heparin level and aPTT in AM HL, CBC daily while on heparin infusion; aPTT as needed while DOAC effects persist Monitor for signs of bleeding Follow for ability to transition back to PO anticoagulation  If additional procedures required, providers will need to give guidance on when to hold heparin drip pre-procedure.  Fallon Howerter A, PharmD 01/11/2021,5:54 PM

## 2021-01-11 NOTE — Progress Notes (Signed)
ANTICOAGULATION CONSULT NOTE - Initial Consult  Pharmacy Consult for heparin Indication: atrial fibrillation; apixaban on hold  Allergies  Allergen Reactions   Lisinopril Rash    Patient Measurements: Height: 5\' 10"  (177.8 cm) Weight: 72.6 kg (160 lb) IBW/kg (Calculated) : 73 Heparin Dosing Weight: n/a. Use TBW = 73 kg  Vital Signs: Temp: 97.8 F (36.6 C) (08/25 0439) BP: 104/64 (08/25 0439) Pulse Rate: 69 (08/25 0439)  Labs: Recent Labs    01/08/21 1256 01/09/21 0452 01/10/21 1335 01/11/21 0448  HGB 12.1*  --  11.4* 11.7*  HCT 39.5  --  38.4* 39.8  PLT 199  --  211 214  APTT  --   --  30  --   HEPARINUNFRC  --   --  0.44  --   CREATININE 0.91 0.98  --   --      Estimated Creatinine Clearance: 52.5 mL/min (by C-G formula based on SCr of 0.98 mg/dL).   Medical History: Past Medical History:  Diagnosis Date   Atrial fibrillation ([place])    BENIGN PROSTATIC HYPERTROPHY    Cataract    DIVERTICULOSIS, COLON    DM    not diabetic-has been off metformin for about 2 years as of 09/2020 pr daughter   GERD    Heart murmur    Hernia    HYPERCHOLESTEROLEMIA    HYPERTENSION    Lung cancer ([place])    Moderate aortic stenosis 10/30/2011   echo 2013   Prostate cancer ([place])    s/p seed implant   Recurrent right pleural effusion     Medications: Apixaban 5 mg PO BID PTA for atrial fibrillation -Last dose: 8/22 @ 8:00 AM   Assessment: Pt is an 13 yoM prescribed apixaban PTA for atrial fibrillation. PMH significant for pleural effusion, s/p thoracentesis on 8/19 PTA. Apixaban held on admission in anticipation for possible procedures. Pt underwent thoracentesis again on 8/23 & 8/24.   Pharmacy consulted to dose IV heparin for anticoagulation while DOAC on hold.   Significant Events: -8/23: Thoracentesis (1.4 L blood tinged, amber fluid) -8/24: Thoracentesis (520 mL amber/blood-tinged fluid)  Today, 01/11/21 CBC: Hgb slightly low but stable; Plt WNL & stable Baseline  labs collected 8/24: HL was falsely elevated due to recent DOAC. aPTT WNL.  Discussed anticoagulation with TR[facility] - ok to initiate heparin this morning. CBC stable post-procedures.   Goal of Therapy:  Heparin level 0.3-0.7 units/ml Monitor platelets by anticoagulation protocol: Yes   Plan:  No initial bolus Initiate heparin infusion at 1100 units/hr (~15 units/kg/hr) Check 8 hour HL/aPTT. Once levels correlate, can monitor using HL only HL/aPTT, CBC daily while on heparin infusion Monitor for signs of bleeding Follow for ability to transition back to PO anticoagulation  If additional procedures required, providers will need to give guidance on when to hold heparin drip pre-procedure.  Lenis Noon, PharmD 01/11/2021,7:29 AM

## 2021-01-11 NOTE — Consult Note (Signed)
Referring Provider:  Dr. Tana Coast, [facility] Primary Care Physician:  [name], MD Primary Gastroenterologist:  Dr. Henrene Pastor  Reason for Consultation:  Cirrhosis, pleural effusions  HPI: Adam Briggs is a 87 y.o. male with history of NSCLC left upper lobe, completed XRT, recurrent right-sided loculated hydropneumothorax.  Patient was admitted for acute hypoxic respiratory failure secondary to recurrent increasing loculated pleural effusion.  Patient had a right upper quadrant abdominal ultrasound performed that showed cirrhosis with a patent main portal vein with hepatopetal flow and no ascites.  Apparently the cirrhosis is a new diagnosis.  LFTs are normal.  Acute viral hepatitis panel is negative.  INR was normal in April and platelets are normal.  He is on very low doses of diuretics, 20 mg IV daily.  Daughter is hoping for transfer to a [facility] where they can try to do something innovative for him.     Past Medical History:  Diagnosis Date   Atrial fibrillation ([place])    BENIGN PROSTATIC HYPERTROPHY    Cataract    DIVERTICULOSIS, COLON    DM    not diabetic-has been off metformin for about 2 years as of 09/2020 pr daughter   GERD    Heart murmur    Hernia    HYPERCHOLESTEROLEMIA    HYPERTENSION    Lung cancer ([place])    Moderate aortic stenosis 10/30/2011   echo 2013   Prostate cancer [facility])    s/p seed implant   Recurrent right pleural effusion     Past Surgical History:  Procedure Laterality Date   BRONCHIAL BIOPSY  10/17/2020   Procedure: BRONCHIAL BIOPSIES;  Surgeon: Garner Nash, DO;  Location: [place] [facility];  Service: Pulmonary;;   BRONCHIAL BRUSHINGS  10/17/2020   Procedure: BRONCHIAL BRUSHINGS;  Surgeon: Garner Nash, DO;  Location: [place] [facility];  Service: Pulmonary;;   BRONCHIAL NEEDLE ASPIRATION BIOPSY  10/17/2020   Procedure: BRONCHIAL NEEDLE ASPIRATION BIOPSIES;  Surgeon: Garner Nash, DO;  Location: [place] [facility];  Service: Pulmonary;;    BRONCHIAL WASHINGS  10/17/2020   Procedure: BRONCHIAL WASHINGS;  Surgeon: Garner Nash, DO;  Location: [place] [facility];  Service: Pulmonary;;   CATARACT EXTRACTION W/ INTRAOCULAR LENS IMPLANT  02/2013   R, then L 4 weeks later - [place]   FIDUCIAL MARKER PLACEMENT  10/17/2020   Procedure: FIDUCIAL MARKER PLACEMENT;  Surgeon: Garner Nash, DO;  Location: [place] [facility];  Service: Pulmonary;;   INGUINAL HERNIA REPAIR     Right   INGUINAL HERNIA REPAIR Left 07/15/2014   Procedure: LEFT INGUINAL HERNIA REPAIR WITH MESH;  Surgeon: Armandina Gemma, MD;  Location: [facility];  Service: General;  Laterality: Left;   INSERTION OF MESH Left 07/15/2014   Procedure: INSERTION OF MESH;  Surgeon: Armandina Gemma, MD;  Location: [facility];  Service: General;  Laterality: Left;   RADIOACTIVE SEED IMPLANT     THORACENTESIS N/A 01/05/2021   Procedure: THORACENTESIS;  Surgeon: Candee Furbish, MD;  Location: [facility];  Service: Pulmonary;  Laterality: N/A;   VIDEO BRONCHOSCOPY WITH ENDOBRONCHIAL NAVIGATION Left 10/17/2020   Procedure: VIDEO BRONCHOSCOPY WITH ENDOBRONCHIAL NAVIGATION;  Surgeon: Garner Nash, DO;  Location: [place];  Service: Pulmonary;  Laterality: Left;   VIDEO BRONCHOSCOPY WITH ENDOBRONCHIAL ULTRASOUND Bilateral 10/17/2020   Procedure: VIDEO BRONCHOSCOPY WITH ENDOBRONCHIAL ULTRASOUND;  Surgeon: Garner Nash, DO;  Location: [place];  Service: Pulmonary;  Laterality: Bilateral;    Prior to Admission medications   Medication Sig Start Date End Date Taking?  Authorizing Provider  albuterol (VENTOLIN HFA) 108 (90 Base) MCG/ACT inhaler INHALE 1 TO 2 PUFFS BY MOUTH EVERY 6 HOURS AS NEEDED FOR WHEEZING OR SHORTNESS OF BREATH Patient taking differently: Inhale 1-2 puffs into the lungs every 6 (six) hours as needed for wheezing or shortness of breath. 11/21/20  Yes [name], MD  apixaban (ELIQUIS) 5 MG TABS tablet Take 1 tablet (5 mg total) by mouth 2 (two) times daily. 11/01/20  Yes  [name], MD  fluticasone [facility]) 50 MCG/ACT nasal spray Place 2 sprays into both nostrils daily. Patient taking differently: Place 2 sprays into both nostrils daily as needed for allergies. 08/04/18  Yes [name], MD  furosemide (LASIX) 40 MG tablet Take 1 tablet (40 mg total) by mouth daily. 11/01/20  Yes [name], MD  metoprolol tartrate (LOPRESSOR) 25 MG tablet Take 1 tablet (25 mg total) by mouth 2 (two) times daily. 11/01/20  Yes [name], MD  Multiple Vitamin (MULTIVITAMIN WITH MINERALS) TABS tablet Take 1 tablet by mouth in the morning. Centrum Silver for Men 50+   Yes [provider]  Polyethylene Glycol 3350 (MIRALAX PO) Take 17 g by mouth daily as needed (constipation.).   Yes [provider]  senna-docusate (SENOKOT-S) 8.6-50 MG tablet Take 1 tablet by mouth at bedtime as needed for mild constipation or moderate constipation. Patient taking differently: Take 1 tablet by mouth at bedtime. 09/28/20  Yes Amin, Jeanella Flattery, MD  simvastatin (ZOCOR) 20 MG tablet Take 1 tablet (20 mg total) by mouth daily at 6 PM. 11/01/20  Yes [name], MD  tamsulosin (FLOMAX) 0.4 MG CAPS capsule Take 0.4 mg by mouth in the morning. 07/21/20  Yes [provider]    Current Facility-Administered Medications  Medication Dose Route Frequency Provider Last Rate Last Admin   0.9 %  sodium chloride infusion  250 mL Intravenous PRN Samuella Cota, MD       acetaminophen (TYLENOL) tablet 650 mg  650 mg Oral Q6H PRN Samuella Cota, MD       Or   acetaminophen (TYLENOL) suppository 650 mg  650 mg Rectal Q6H PRN Samuella Cota, MD       alum & mag hydroxide-simeth (MAALOX/MYLANTA) 200-200-20 MG/5ML suspension 30 mL  30 mL Oral Q4H PRN Samuella Cota, MD   30 mL at 01/10/21 2152   fluticasone (FLONASE) 50 MCG/ACT nasal spray 2 spray  2 spray Each Nare Daily PRN Samuella Cota, MD       furosemide (LASIX)  injection 20 mg  20 mg Intravenous Daily Samuella Cota, MD   20 mg at 01/11/21 0800   heparin ADULT infusion 100 units/mL (25000 units/279mL)  1,100 Units/hr Intravenous Continuous Lenis Noon, RPH 11 mL/hr at 01/11/21 0753 1,100 Units/hr at 01/11/21 0753   levalbuterol (XOPENEX) nebulizer solution 0.63 mg  0.63 mg Nebulization Q6H PRN Kathryne Eriksson, NP   0.63 mg at 01/09/21 0753   metoprolol tartrate (LOPRESSOR) tablet 25 mg  25 mg Oral BID Samuella Cota, MD   25 mg at 01/11/21 0800   ondansetron (ZOFRAN) tablet 4 mg  4 mg Oral Q6H PRN Samuella Cota, MD       Or   ondansetron [facility]) injection 4 mg  4 mg Intravenous Q6H PRN Samuella Cota, MD       polyethylene glycol (MIRALAX / GLYCOLAX) packet 17 g  17 g Oral Daily PRN Samuella Cota,  MD   17 g at 01/11/21 0800   senna-docusate (Senokot-S) tablet 1 tablet  1 tablet Oral QHS Samuella Cota, MD   1 tablet at 01/10/21 2152   simvastatin (ZOCOR) tablet 20 mg  20 mg Oral q1800 Samuella Cota, MD   20 mg at 01/10/21 1817   sodium chloride flush (NS) 0.9 % injection 3 mL  3 mL Intravenous Q12H Samuella Cota, MD   3 mL at 01/11/21 0800   sodium chloride flush (NS) 0.9 % injection 3 mL  3 mL Intravenous PRN Samuella Cota, MD       tamsulosin [facility]) capsule 0.4 mg  0.4 mg Oral q AM Samuella Cota, MD   0.4 mg at 01/11/21 0800    Allergies as of 01/08/2021 - Review Complete 01/08/2021  Allergen Reaction Noted   Lisinopril Rash     Family History  Problem Relation Age of Onset   Heart attack Paternal Uncle        in his 78s   Atrial fibrillation Daughter    Colon cancer Neg Hx     Social History   Socioeconomic History   Marital status: Widowed    Spouse name: Not on file   Number of children: 2   Years of education: Not on file   Highest education level: Not on file  Occupational History   Occupation: Retired     Comment: Retired  Tobacco Use   Smoking status: Former    Packs/day:  0.50    Years: 35.00    Pack years: 17.50    Types: Cigarettes    Quit date: 10/30/1966    Years since quitting: 54.2   Smokeless tobacco: Never  Vaping Use   Vaping Use: Never used  Substance and Sexual Activity   Alcohol use: No   Drug use: No   Sexual activity: Not on file  Other Topics Concern   Not on file  Social History Narrative   Pt says his diet is excellent   Daily caffeine    Social Determinants of Health   Financial Resource Strain: Not on file  Food Insecurity: Not on file  Transportation Needs: Not on file  Physical Activity: Not on file  Stress: Not on file  Social Connections: Not on file  Intimate Partner Violence: Not on file   Review of Systems: ROS is O/W negative except as mentioned in HPI.  Physical Exam: Vital signs in last 24 hours: Temp:  [97.6 F (36.4 C)-98 F (36.7 C)] 97.6 F (36.4 C) (08/25 1300) Pulse Rate:  [69-97] 97 (08/25 1300) Resp:  [19-22] 22 (08/25 1300) BP: (104-129)/(60-83) 107/60 (08/25 1300) SpO2:  [97 %-100 %] 97 % (08/25 1300) Last BM Date: 01/10/21 General:  Alert, elderly, pleasant and cooperative in NAD Head:  Normocephalic and atraumatic. Eyes:  Sclera clear, no icterus.  Conjunctiva pink. Ears:  Normal auditory acuity. Mouth:  No deformity or lesions.   Lungs:  Decreased breath sounds.  Some mild wheezing noted. Heart:  Regular rate and rhythm; murmur noted Abdomen:  Soft, non-distended.  BS present.  Non-tender.  Msk:  Symmetrical without gross deformities. Pulses:  Normal pulses noted. Extremities:  1-2+ pitting edema in B/L LEs Neurologic:  Alert and oriented x 4;  grossly normal neurologically. Skin:  Intact without significant lesions or rashes. Psych:  Alert and cooperative. Normal mood and affect.  Intake/Output from previous day: 08/24 0701 - 08/25 0700 In: 220 [P.O.:220] Out: 350 [Urine:350] Intake/Output this shift:  Total I/O In: 75.4 [I.V.:75.4] Out: 350 [Urine:350]  Lab Results: Recent  Labs    01/10/21 1335 01/11/21 0448  WBC 6.6 6.0  HGB 11.4* 11.7*  HCT 38.4* 39.8  PLT 211 214   BMET Recent Labs    01/09/21 0452  NA 137  K 4.0  CL 96*  CO2 33*  GLUCOSE 97  BUN 27*  CREATININE 0.98  CALCIUM 8.5*    Hepatitis Panel Recent Labs    01/10/21 0810  HEPBSAG NON REACTIVE  HCVAB NON REACTIVE  HEPAIGM NON REACTIVE  HEPBIGM NON REACTIVE    Studies/Results: DG Chest 1 View  Result Date: 01/10/2021 CLINICAL DATA:  Status post right thoracentesis. EXAM: CHEST  1 VIEW COMPARISON:  01/09/2021 FINDINGS: Large loculated right pleural effusion is again identified. There has been slight decrease in volume of the right pleural effusion with mildly improved aeration to the right mid lung. No definite pneumothorax identified. Small left pleural effusion appears unchanged. IMPRESSION: 1. Slight decrease in volume of large loculated right pleural effusion status post thoracentesis. 2. No pneumothorax. Electronically Signed   By: Kerby Moors M.D.   On: 01/10/2021 13:02   VAS [place] LOWER EXTREMITY VENOUS (DVT)  Result Date: 01/10/2021  Lower Venous DVT Study Patient Name:  Adam Briggs  Date of Exam:   01/10/2021 Medical Rec #: [id number]      Accession #:    [id number] Date of Birth: 1929/11/01      Patient Gender: M Patient Age:   46 years Exam Location:  [facility] Procedure:      VAS [place] LOWER EXTREMITY VENOUS (DVT) Referring Phys: MURALI RAMASWAMY --------------------------------------------------------------------------------  Indications: Edema.  Risk Factors: None identified. Comparison Study: No prior studies. Performing Technologist: Oliver Hum RVT  Examination Guidelines: A complete evaluation includes B-mode imaging, spectral Doppler, color Doppler, and power Doppler as needed of all accessible portions of each vessel. Bilateral testing is considered an integral part of a complete examination. Limited examinations for reoccurring indications may be performed  as noted. The reflux portion of the exam is performed with the patient in reverse Trendelenburg.  +---------+---------------+---------+-----------+----------+--------------+ RIGHT    CompressibilityPhasicitySpontaneityPropertiesThrombus Aging +---------+---------------+---------+-----------+----------+--------------+ CFV      Full           Yes      Yes                                 +---------+---------------+---------+-----------+----------+--------------+ SFJ      Full                                                        +---------+---------------+---------+-----------+----------+--------------+ FV Prox  Full                                                        +---------+---------------+---------+-----------+----------+--------------+ FV Mid   Full                                                        +---------+---------------+---------+-----------+----------+--------------+  FV DistalFull                                                        +---------+---------------+---------+-----------+----------+--------------+ PFV      Full                                                        +---------+---------------+---------+-----------+----------+--------------+ POP      Full           Yes      Yes                                 +---------+---------------+---------+-----------+----------+--------------+ PTV      Full                                                        +---------+---------------+---------+-----------+----------+--------------+ PERO     Full                                                        +---------+---------------+---------+-----------+----------+--------------+   +---------+---------------+---------+-----------+----------+--------------+ LEFT     CompressibilityPhasicitySpontaneityPropertiesThrombus Aging +---------+---------------+---------+-----------+----------+--------------+ CFV      Full            Yes      Yes                                 +---------+---------------+---------+-----------+----------+--------------+ SFJ      Full                                                        +---------+---------------+---------+-----------+----------+--------------+ FV Prox  Full                                                        +---------+---------------+---------+-----------+----------+--------------+ FV Mid   Full                                                        +---------+---------------+---------+-----------+----------+--------------+ FV DistalFull                                                        +---------+---------------+---------+-----------+----------+--------------+  PFV      Full                                                        +---------+---------------+---------+-----------+----------+--------------+ POP      Full           Yes      Yes                                 +---------+---------------+---------+-----------+----------+--------------+ PTV      Full                                                        +---------+---------------+---------+-----------+----------+--------------+ PERO     Full                                                        +---------+---------------+---------+-----------+----------+--------------+     Summary: RIGHT: - There is no evidence of deep vein thrombosis in the lower extremity.  - No cystic structure found in the popliteal fossa.  LEFT: - There is no evidence of deep vein thrombosis in the lower extremity.  - No cystic structure found in the popliteal fossa.  *See table(s) above for measurements and observations. Electronically signed by Harold Barban MD on 01/10/2021 at 8:06:44 PM.    Final    US THORACENTESIS ASP PLEURAL SPACE W/IMG GUIDE  Result Date: 01/10/2021 INDICATION: Patient with history of lung and prostate cancers, bilateral pleural effusions, cirrhosis by imaging;  request received for diagnostic and therapeutic right thoracentesis. EXAM: ULTRASOUND GUIDED DIAGNOSTIC AND THERAPEUTIC RIGHT THORACENTESIS MEDICATIONS: 1% lidocaine to skin and subcutaneous tissue COMPLICATIONS: None immediate. PROCEDURE: An ultrasound guided thoracentesis was thoroughly discussed with the patient/daughter and questions answered. The benefits, risks, alternatives and complications were also discussed. The patient understands and wishes to proceed with the procedure. Written consent was obtained. Ultrasound was performed to localize and mark an adequate pocket of fluid in the right chest. The area was then prepped and draped in the normal sterile fashion. 1% Lidocaine was used for local anesthesia. Under ultrasound guidance a 6 Fr Safe-T-Centesis catheter was introduced. Thoracentesis was performed. The catheter was removed and a dressing applied. FINDINGS: A total of approximately 520 cc of amber/blood-tinged fluid was removed. Samples were sent to the laboratory as requested by the clinical team. Due to patient chest discomfort only the above amount of fluid was removed today. IMPRESSION: Successful ultrasound guided diagnostic and therapeutic right thoracentesis yielding 520 cc of pleural fluid. Read by: Rowe Robert, PA-C Electronically Signed   By: Markus Daft M.D.   On: 01/10/2021 12:41   US THORACENTESIS ASP PLEURAL SPACE W/IMG GUIDE  Result Date: 01/10/2021 INDICATION: Patient with history of non-small cell lung cancer, left pleural effusion; request for left thoracentesis EXAM: ULTRASOUND GUIDED DIAGNOSTIC AND THERAPEUTIC LEFT THORACENTESIS MEDICATIONS: 5 ML 1% LIDOCAINE COMPLICATIONS: None immediate. PROCEDURE: An ultrasound guided thoracentesis was thoroughly discussed with the patient  and questions answered. The benefits, risks, alternatives and complications were also discussed. The patient understands and wishes to proceed with the procedure. Written consent was obtained. Ultrasound  was performed to localize and mark an adequate pocket of fluid in the left chest. The area was then prepped and draped in the normal sterile fashion. 1% Lidocaine was used for local anesthesia. Under ultrasound guidance a 6 Fr Safe-T-Centesis catheter was introduced. Thoracentesis was performed. The catheter was removed and a dressing applied. FINDINGS: A total of approximately 1.4 L of blood-tinged, amber fluid was removed. Samples were sent to the laboratory as requested by the clinical team. IMPRESSION: Successful ultrasound guided diagnostic and therapeutic left thoracentesis yielding 1.4 L of pleural fluid. Read by: Narda Rutherford, NP Electronically Signed   By: Lucrezia Europe M.D.   On: 01/09/2021 15:40    IMPRESSION:  *Cirrhosis: New diagnosis, seen on abdominal ultrasound this admission.  No ascites.  LFTs are normal.  Acute viral hepatitis panel is negative.  INR is normal (was in April) and platelets are normal.  Unlikely that his pleural effusions are due to cirrhosis in the absence of ascites.  Not sure it would really change management either way.  Doubt he is a TIPS candidate.  At this point he is not even considered diuretic refractory with the low doses of lasix that he is receiving. *Atrial fibrillation on Eliquis *History of non-small cell lung cancer s/p radiation  PLAN: -Will check a BMP and INR today. -If electrolytes and renal function allows then would titrate his lasix. -We will discuss with Dr. Rush Landmark for any further recommendations.   Laban Emperor. Dakin Madani  01/11/2021, 3:42 PM

## 2021-01-11 NOTE — Consult Note (Signed)
Cardiology Consultation:   Patient ID: Adam Briggs MRN: [id number]; DOB: 1929-06-04  Admit date: 01/08/2021 Date of Consult: 01/11/2021  PCP:  [name], MD   [facility] Cardiologist:  Minus Breeding, MD        Patient Profile:   Adam Briggs is a 87 y.o. male with a hx of persistent atrial fibrillation, recurrent pleural effusions, HTN, lung CA, HLD, moderate aortic stenosis, GERD, diverticulosis, prostate CA who is being seen 01/11/2021 for the evaluation of aortic stenosis and pleural effusions at the request of Dr. Tana Coast.  History of Present Illness:   Adam Briggs followed remotely with Dr. Percival Spanish for aortic stenosis, but recently re-established with our team for new onset atrial fib when admitted in 08/2020 for pleural effusions and lung mass. From afib standpoint, he was treated with rate control strategy and anticoagulation. 2D echo at the time 09/2020 was poor quality study due to suboptimal windows, EF 50-55%, moderate AS, not well visualized.  His pleural effusions have had a more complex course. During the April-May 2022 admission, his CT had shown right sided loculated pleural effusion with irregular thickening/masslike consolidation in the right lower lung, highly suspicious mass in the left upper lobe, additional moderate left pleural effusion suspicious for malignancy, and small low density lesion in the right liver suspicious for hepatic metastasis. He required a thoracentesis and chest tube that admission. Effusion was borderline exudate. F/u outpatient PET scan showed "hypermetabolic left upper lobe pulmonary nodule most consistent with primary bronchogenic carcinoma, nodule touches the mediastinal pleural surface, hypermetabolic subcarinal and lower paratracheal lymph nodes (differential includes metastatic adenopathy versus reactive adenopathy related to the RIGHT lung chronic infection), right lung empyema, lower lobe consolidation and diffuse  atelectasis with moderate metabolic activity associated with these RIGHT lung findings; however, favored benign infectious or inflammatory." Bronchoscopy showed atypical cells and was otherwise nondiagnostic. He was still felt to have probable lung CA and referred for empiric radiation SBRT and plans for follow-up of his right sided loculated hydropneumothorax. Per pulmonology note, his diagnosis was was Stage IA3, cT1cN0M0, NSCLC of the LUL. He underwent radiation.   He has subsequently had issues with recurrent pleural effusion and had right sided thoracentesis 01/05/21 - cytology from this tap showed chronic inflammation without malignancy.  He returned to the hospital 01/08/21 with increased cough, wheezing, SOB, hypoxia. CT showed diminished aeration of the right lung with loculated fluid characterized as empyema, and large left pleural effusion significantly increased in volume. He underwent left sided thoracentesis 8/23 (1.4L - path with reactive mesothelial cells and numerous lymphoid cells, suspected transudate) and right sided thoracentesis 8/24 (520cc with cytology pending, borderline transudate/exudate). LE venous duplex for edema was negative for DVT. PCCM considering consultation to CVTS for VATS once cytology is back on cytology on right sided thoracentesis. RUQ has also shown cirrhosis felt to be potential etiology. Albumin is 3.4. 2D echocardiogram 01/09/21 was largely nondiagnostic due to poor acoustic windows, EF 65-70%, degenerative MV, abnormal aortic valve, not further quantified. Family desiring aggressive care and inquiring about transfer to tertiary center. Cardiology asked to weigh in re: AS. He has not had any chest pain or syncope. He remains dyspneic with even mild exertion. Daughter at bedside and states he was very active before all of this came about this year.   Past Medical History:  Diagnosis Date   Atrial fibrillation (HCC)    BENIGN PROSTATIC HYPERTROPHY    Cataract     DIVERTICULOSIS, COLON  DM    not diabetic-has been off metformin for about 2 years as of 09/2020 pr daughter   GERD    Heart murmur    Hernia    HYPERCHOLESTEROLEMIA    HYPERTENSION    Lung cancer ([place])    Moderate aortic stenosis 10/30/2011   echo 2013   Prostate cancer [facility])    s/p seed implant   Recurrent right pleural effusion     Past Surgical History:  Procedure Laterality Date   BRONCHIAL BIOPSY  10/17/2020   Procedure: BRONCHIAL BIOPSIES;  Surgeon: Garner Nash, DO;  Location: [place] [facility];  Service: Pulmonary;;   BRONCHIAL BRUSHINGS  10/17/2020   Procedure: BRONCHIAL BRUSHINGS;  Surgeon: Garner Nash, DO;  Location: [place] [facility];  Service: Pulmonary;;   BRONCHIAL NEEDLE ASPIRATION BIOPSY  10/17/2020   Procedure: BRONCHIAL NEEDLE ASPIRATION BIOPSIES;  Surgeon: Garner Nash, DO;  Location: [place] [facility];  Service: Pulmonary;;   BRONCHIAL WASHINGS  10/17/2020   Procedure: BRONCHIAL WASHINGS;  Surgeon: Garner Nash, DO;  Location: [place] [facility];  Service: Pulmonary;;   CATARACT EXTRACTION W/ INTRAOCULAR LENS IMPLANT  02/2013   R, then L 4 weeks later - [place]   FIDUCIAL MARKER PLACEMENT  10/17/2020   Procedure: FIDUCIAL MARKER PLACEMENT;  Surgeon: Garner Nash, DO;  Location: [place] [facility];  Service: Pulmonary;;   INGUINAL HERNIA REPAIR     Right   INGUINAL HERNIA REPAIR Left 07/15/2014   Procedure: LEFT INGUINAL HERNIA REPAIR WITH MESH;  Surgeon: Armandina Gemma, MD;  Location: [facility];  Service: General;  Laterality: Left;   INSERTION OF MESH Left 07/15/2014   Procedure: INSERTION OF MESH;  Surgeon: Armandina Gemma, MD;  Location: [facility];  Service: General;  Laterality: Left;   RADIOACTIVE SEED IMPLANT     THORACENTESIS N/A 01/05/2021   Procedure: THORACENTESIS;  Surgeon: Candee Furbish, MD;  Location[facility];  Service: Pulmonary;  Laterality: N/A;   VIDEO BRONCHOSCOPY WITH ENDOBRONCHIAL NAVIGATION Left 10/17/2020   Procedure: VIDEO BRONCHOSCOPY WITH  ENDOBRONCHIAL NAVIGATION;  Surgeon: Garner Nash, DO;  Location: [place];  Service: Pulmonary;  Laterality: Left;   VIDEO BRONCHOSCOPY WITH ENDOBRONCHIAL ULTRASOUND Bilateral 10/17/2020   Procedure: VIDEO BRONCHOSCOPY WITH ENDOBRONCHIAL ULTRASOUND;  Surgeon: Garner Nash, DO;  Location: [place];  Service: Pulmonary;  Laterality: Bilateral;     Home Medications:  Prior to Admission medications   Medication Sig Start Date End Date Taking? Authorizing Provider  albuterol (VENTOLIN HFA) 108 (90 Base) MCG/ACT inhaler INHALE 1 TO 2 PUFFS BY MOUTH EVERY 6 HOURS AS NEEDED FOR WHEEZING OR SHORTNESS OF BREATH Patient taking differently: Inhale 1-2 puffs into the lungs every 6 (six) hours as needed for wheezing or shortness of breath. 11/21/20  Yes [name], MD  apixaban (ELIQUIS) 5 MG TABS tablet Take 1 tablet (5 mg total) by mouth 2 (two) times daily. 11/01/20  Yes [name], MD  fluticasone [facility]) 50 MCG/ACT nasal spray Place 2 sprays into both nostrils daily. Patient taking differently: Place 2 sprays into both nostrils daily as needed for allergies. 08/04/18  Yes [name], MD  furosemide (LASIX) 40 MG tablet Take 1 tablet (40 mg total) by mouth daily. 11/01/20  Yes [name], MD  metoprolol tartrate (LOPRESSOR) 25 MG tablet Take 1 tablet (25 mg total) by mouth 2 (two) times daily. 11/01/20  Yes [name], MD  Multiple Vitamin (MULTIVITAMIN WITH MINERALS) TABS tablet Take 1 tablet by mouth in the morning. Centrum  Silver for Men 50+   Yes [provider]  Polyethylene Glycol 3350 (MIRALAX PO) Take 17 g by mouth daily as needed (constipation.).   Yes [provider]  senna-docusate (SENOKOT-S) 8.6-50 MG tablet Take 1 tablet by mouth at bedtime as needed for mild constipation or moderate constipation. Patient taking differently: Take 1 tablet by mouth at bedtime. 09/28/20  Yes Amin, Jeanella Flattery, MD  simvastatin  (ZOCOR) 20 MG tablet Take 1 tablet (20 mg total) by mouth daily at 6 PM. 11/01/20  Yes [name], MD  tamsulosin (FLOMAX) 0.4 MG CAPS capsule Take 0.4 mg by mouth in the morning. 07/21/20  Yes [provider]    Inpatient Medications: Scheduled Meds:  furosemide  20 mg Intravenous Daily   metoprolol tartrate  25 mg Oral BID   senna-docusate  1 tablet Oral QHS   simvastatin  20 mg Oral q1800   sodium chloride flush  3 mL Intravenous Q12H   tamsulosin  0.4 mg Oral q AM   Continuous Infusions:  sodium chloride     heparin 1,100 Units/hr (01/11/21 0753)   PRN Meds: sodium chloride, acetaminophen **OR** acetaminophen, alum & mag hydroxide-simeth, fluticasone, levalbuterol, ondansetron **OR** ondansetron (ZOFRAN) IV, polyethylene glycol, sodium chloride flush  Allergies:    Allergies  Allergen Reactions   Lisinopril Rash    Social History:   Social History   Socioeconomic History   Marital status: Widowed    Spouse name: Not on file   Number of children: 2   Years of education: Not on file   Highest education level: Not on file  Occupational History   Occupation: Retired     Comment: Retired  Tobacco Use   Smoking status: Former    Packs/day: 0.50    Years: 35.00    Pack years: 17.50    Types: Cigarettes    Quit date: 10/30/1966    Years since quitting: 54.2   Smokeless tobacco: Never  Vaping Use   Vaping Use: Never used  Substance and Sexual Activity   Alcohol use: No   Drug use: No   Sexual activity: Not on file  Other Topics Concern   Not on file  Social History Narrative   Pt says his diet is excellent   Daily caffeine    Social Determinants of Health   Financial Resource Strain: Not on file  Food Insecurity: Not on file  Transportation Needs: Not on file  Physical Activity: Not on file  Stress: Not on file  Social Connections: Not on file  Intimate Partner Violence: Not on file    Family History:    Family History  Problem  Relation Age of Onset   Heart attack Paternal Uncle        in his 54s   Atrial fibrillation Daughter    Colon cancer Neg Hx      ROS:  Please see the history of present illness.  All other ROS reviewed and negative.     Physical Exam/Data:   Vitals:   01/10/21 1157 01/10/21 1300 01/10/21 2032 01/11/21 0439  BP: 108/70 106/61 129/83 104/64  Pulse:  83 81 69  Resp:  (!) 25 19 20   Temp:  98.5 F (36.9 C) 98 F (36.7 C) 97.8 F (36.6 C)  TempSrc:  Oral    SpO2:  99% 98% 100%  Weight:      Height:        Intake/Output Summary (Last 24 hours) at 01/11/2021 1233 Last data filed  at 01/11/2021 0900 Gross per 24 hour  Intake --  Output 700 ml  Net -700 ml   Last 3 Weights 01/08/2021 11/27/2020 11/03/2020  Weight (lbs) 160 lb 163 lb 6.4 oz 162 lb 9.6 oz  Weight (kg) 72.576 kg 74.118 kg 73.755 kg     Body mass index is 22.96 kg/m.  General: Elderly well developed WM n no acute distress. Head: Normocephalic, atraumatic, sclera non-icteric, no xanthomas, nares are without discharge. Neck: Negative for carotid bruits. JVP not elevated. Lungs: Diminished BS bilaterally. Breathing is unlabored. Heart: Irregular, 2/6 higher pitched murmur RUSB, diminished S2, no rubs or gallops.  Abdomen: Soft, non-tender, non-distended with normoactive bowel sounds. No rebound/guarding. Extremities: No clubbing or cyanosis. 1+ pitting edema superimposed on generally small circumference lower limbs. Distal pedal pulses are 2+ and equal bilaterally. Neuro: Alert and oriented X 3. Moves all extremities spontaneously. Hard of hearing. Psych:  Responds to questions appropriately with a normal affect.   EKG:  The EKG was personally reviewed and demonstrates:  atrial fib 90bpm, left axis deviation, prior anterior infarct pattern, nonspecific STT changes Telemetry:  Telemetry was personally reviewed and demonstrates:  atrial fib, variable rates, currently controlled 60s-70s  Relevant CV Studies: Echo  01/09/21 IMPRESSIONS     1. Largely nondiagnostic study due to poor acoustic windows for imaging.  Limited findings as noted below.   2. Left ventricular ejection fraction, by estimation, is 65 to 70%. The  left ventricle has normal function. Left ventricular diastolic parameters  are indeterminate.   3. Large pleural effusion.   4. The mitral valve is degenerative.   5. The aortic valve is abnormal.   2D echo 09/20/20  1. As with prior echo 09/16/20 poor quality images LV has abnormal septal  motion unable to assess RWMA;s consider TEE if clinically indicated to  also evaluate severity of AS. Left ventricular ejection fraction, by  estimation, is ? 50-55%%. The left  ventricle has mildly decreased function. Left ventricular endocardial  border not optimally defined to evaluate regional wall motion. There is  mild left ventricular hypertrophy. Left ventricular diastolic parameters  are indeterminate.   2. Right ventricular systolic function was not well visualized. The right  ventricular size is not well visualized.   3. The mitral valve was not well visualized. Trivial mitral valve  regurgitation.   4. Poorly visualized some calcium seen mean gradient 19 peak 37 mmHg  likely moderate AS. Consider TEE if clinically indicated . The aortic  valve was not well visualized. Aortic valve regurgitation is not  visualized. Moderate aortic valve stenosis.   5. Pulmonic valve regurgitation poorly seen.   Laboratory Data:  High Sensitivity Troponin:  No results for input(s): TROPONINIHS in the last 720 hours.   Chemistry Recent Labs  Lab 01/08/21 1256 01/09/21 0452  NA 140 137  K 3.7 4.0  CL 99 96*  CO2 30 33*  GLUCOSE 152* 97  BUN 27* 27*  CREATININE 0.91 0.98  CALCIUM 8.8* 8.5*  GFRNONAA >60 >60  ANIONGAP 11 8    Recent Labs  Lab 01/08/21 1256  PROT 6.7  ALBUMIN 3.4*  AST 15  ALT 16  ALKPHOS 57  BILITOT 0.8   Hematology Recent Labs  Lab 01/08/21 1256 01/10/21 1335  01/11/21 0448  WBC 4.9 6.6 6.0  RBC 4.78 4.48 4.65  HGB 12.1* 11.4* 11.7*  HCT 39.5 38.4* 39.8  MCV 82.6 85.7 85.6  MCH 25.3* 25.4* 25.2*  MCHC 30.6 29.7* 29.4*  RDW 15.6*  15.1 15.4  PLT 199 211 214   BNP Recent Labs  Lab 01/08/21 1256  BNP 412.0*    DDimer No results for input(s): DDIMER in the last 168 hours.   Radiology/Studies:  DG Chest 1 View  Result Date: 01/10/2021 CLINICAL DATA:  Status post right thoracentesis. EXAM: CHEST  1 VIEW COMPARISON:  01/09/2021 FINDINGS: Large loculated right pleural effusion is again identified. There has been slight decrease in volume of the right pleural effusion with mildly improved aeration to the right mid lung. No definite pneumothorax identified. Small left pleural effusion appears unchanged. IMPRESSION: 1. Slight decrease in volume of large loculated right pleural effusion status post thoracentesis. 2. No pneumothorax. Electronically Signed   By: Kerby Moors M.D.   On: 01/10/2021 13:02   DG Chest 1 View  Result Date: 01/09/2021 CLINICAL DATA:  Status post left thoracentesis EXAM: CHEST  1 VIEW COMPARISON:  01/08/2021 FINDINGS: Single frontal view of the chest demonstrates near complete resolution of the left pleural effusion after interval left-sided thoracentesis. No evidence of left-sided pneumothorax. Minimal consolidation at the left lung base consistent with residual atelectasis. There is persistent loculated right pleural effusion consistent with known empyema. Multifocal consolidation throughout the right lung is stable. Cardiac silhouette is unchanged. No acute bony abnormalities. IMPRESSION: 1. No complication after left-sided thoracentesis. Near complete resolution of left pleural effusion. 2. Stable known right empyema and underlying right lung consolidation. Electronically Signed   By: Randa Ngo M.D.   On: 01/09/2021 15:57   DG Chest 2 View  Result Date: 01/08/2021 CLINICAL DATA:  Increased shortness of breath EXAM:  CHEST - 2 VIEW COMPARISON:  Chest x-ray dated January 05, 2021 FINDINGS: Visualized cardiac and mediastinal contours are unchanged. Increased size of moderate to large loculated right pleural effusion. Persistent right lung consolidation. Small left pleural effusion. No pneumothorax. IMPRESSION: Increased size of moderate to large loculated right pleural effusion when compared with most recent prior x-ray. Increased size of small left pleural effusion. Electronically Signed   By: Yetta Glassman M.D.   On: 01/08/2021 13:56   CT CHEST WO CONTRAST  Result Date: 01/08/2021 CLINICAL DATA:  Non-small cell lung cancer. Status post radiotherapy. Recurrent right-sided loculated hydropneumothorax. EXAM: CT CHEST WITHOUT CONTRAST TECHNIQUE: Multidetector CT imaging of the chest was performed following the standard protocol without IV contrast. COMPARISON:  10/02/2020 FINDINGS: Cardiovascular: Moderate cardiac enlargement. No pericardial effusion identified. Aortic atherosclerosis. Coronary artery calcifications. Mediastinum/Nodes: Normal appearance of the thyroid gland. The trachea appears patent and is midline. Normal appearance of the esophagus. No enlarged axillary lymph nodes. Multiple prominent mediastinal lymph nodes are again identified. None of these meet CT criteria for adenopathy. Index nodes include: -left pre-vascular lymph node measures 1.1 cm, image 58/2. Unchanged from previous exam. Right paratracheal lymph node measures 1.1 cm, image 46/2. Previously 0.8 cm. Hilar lymph nodes are suboptimally evaluated due to lack of IV contrast material. Lungs/Pleura: Moderate loculated right pleural effusion is again noted and does not appear significantly improved in volume when compared with 10/02/2020. Decrease gas within this fluid collection noted compared with 10/02/2020. Significantly diminished aeration to the right lung. Overlying areas of pleural thickening and presumed rounded atelectasis noted within the  right middle lobe and right lower lobe. Multiple areas of subsegmental atelectasis also noted within the aerated portions of the right lung. Moderate to large left pleural effusion has increased in volume from the previous exam. Treated tumor within the anteromedial left upper lobe measures 1.7 cm, image 39/5. Previously  1.8 cm. Patchy areas of ground-glass and airspace density within the anterior left upper lobe is nonspecific, but favored to represent sequelae of external beam radiation. New atelectasis of the inferior lingula, image 114/5. Upper Abdomen: No acute findings within the imaged portions of the upper abdomen. Aortic atherosclerosis noted. Musculoskeletal: Spondylosis within the thoracic spine. No acute or significant osseous findings. IMPRESSION: 1. Stable appearance of treated tumor within the anteromedial left upper lobe. Adjacent area of ground-glass and airspace density is favored to represent sequelae of external beam radiation. 2. Large left pleural effusion has significantly increased in volume when compared with 10/02/2020. 3. No significant change in volume of loculated right pleural effusion, previously characterized as empyema. Volume of gas within the right pleural fluid collection has decreased in the interval. 4. As before there is significantly diminished aeration to the right lung. Similar to previous exam is diffuse pleural thickening overlying the loculated right pleural fluid collection with areas of presumed rounded atelectasis as well as segmental and subsegmental atelectasis. 5. Aortic atherosclerosis and coronary artery calcifications. Aortic Atherosclerosis (ICD10-I70.0). Electronically Signed   By: Kerby Moors M.D.   On: 01/08/2021 19:01   ECHOCARDIOGRAM COMPLETE  Result Date: 01/09/2021    ECHOCARDIOGRAM REPORT   Patient Name:   HONEST VANLEER Date of Exam: 01/09/2021 Medical Rec #:  [id number]     Height:       70.0 in Accession #:    [id number]    Weight:       160.0 lb  Date of Birth:  1929/12/20     BSA:          1.898 m Patient Age:    40 years      BP:           118/68 mmHg Patient Gender: M             HR:           82 bpm. Exam Location:  Inpatient Procedure: 2D Echo, Cardiac Doppler and Color Doppler Indications:    Cardiomyopathy-Unspecified I42.9  History:        Patient has prior history of Echocardiogram examinations, most                 recent 09/20/2020. Aortic Valve Disease; Arrythmias:Atrial                 Fibrillation. Non-small cell lung cancer. Right pneumothorax.                 Acute hypoxemic respiratory failure.  Sonographer:    Darlina Sicilian RDCS Referring Phys: [phone] [facility]  Sonographer Comments: Suboptimal parasternal window, suboptimal apical window and suboptimal subcostal window. Image acquisition challenging due to respiratory motion. IMPRESSIONS  1. Largely nondiagnostic study due to poor acoustic windows for imaging. Limited findings as noted below.  2. Left ventricular ejection fraction, by estimation, is 65 to 70%. The left ventricle has normal function. Left ventricular diastolic parameters are indeterminate.  3. Large pleural effusion.  4. The mitral valve is degenerative.  5. The aortic valve is abnormal. FINDINGS  Left Ventricle: Left ventricular ejection fraction, by estimation, is 65 to 70%. The left ventricle has normal function. Definity contrast agent was given IV to delineate the left ventricular endocardial borders. Left ventricular diastolic parameters are indeterminate. Mitral Valve: The mitral valve is degenerative in appearance. Aortic Valve: The aortic valve is abnormal. Additional Comments: There is a large pleural effusion.  LEFT VENTRICLE PLAX 2D LVOT diam:  1.60 cm LVOT Area:     2.01 cm   AORTA Ao Root diam: 3.00 cm MITRAL VALVE MV Area (PHT): 4.30 cm     SHUNTS MV Decel Time: 176 msec     Systemic Diam: 1.60 cm MV E velocity: 109.33 cm/s Cherlynn Kaiser MD Electronically signed by Cherlynn Kaiser MD Signature  Date/Time: 01/09/2021/5:29:15 PM    Final    VAS [place] LOWER EXTREMITY VENOUS (DVT)  Result Date: 01/10/2021  Lower Venous DVT Study Patient Name:  LEITH SZAFRANSKI  Date of Exam:   01/10/2021 Medical Rec #: [id number]      Accession #:    [id number] Date of Birth: 01-May-1930      Patient Gender: M Patient Age:   38 years Exam Location:  [facility] Procedure:      VAS [place] LOWER EXTREMITY VENOUS (DVT) Referring Phys: MURALI RAMASWAMY --------------------------------------------------------------------------------  Indications: Edema.  Risk Factors: None identified. Comparison Study: No prior studies. Performing Technologist: Oliver Hum RVT  Examination Guidelines: A complete evaluation includes B-mode imaging, spectral Doppler, color Doppler, and power Doppler as needed of all accessible portions of each vessel. Bilateral testing is considered an integral part of a complete examination. Limited examinations for reoccurring indications may be performed as noted. The reflux portion of the exam is performed with the patient in reverse Trendelenburg.  +---------+---------------+---------+-----------+----------+--------------+ RIGHT    CompressibilityPhasicitySpontaneityPropertiesThrombus Aging +---------+---------------+---------+-----------+----------+--------------+ CFV      Full           Yes      Yes                                 +---------+---------------+---------+-----------+----------+--------------+ SFJ      Full                                                        +---------+---------------+---------+-----------+----------+--------------+ FV Prox  Full                                                        +---------+---------------+---------+-----------+----------+--------------+ FV Mid   Full                                                        +---------+---------------+---------+-----------+----------+--------------+ FV DistalFull                                                         +---------+---------------+---------+-----------+----------+--------------+ PFV      Full                                                        +---------+---------------+---------+-----------+----------+--------------+  POP      Full           Yes      Yes                                 +---------+---------------+---------+-----------+----------+--------------+ PTV      Full                                                        +---------+---------------+---------+-----------+----------+--------------+ PERO     Full                                                        +---------+---------------+---------+-----------+----------+--------------+   +---------+---------------+---------+-----------+----------+--------------+ LEFT     CompressibilityPhasicitySpontaneityPropertiesThrombus Aging +---------+---------------+---------+-----------+----------+--------------+ CFV      Full           Yes      Yes                                 +---------+---------------+---------+-----------+----------+--------------+ SFJ      Full                                                        +---------+---------------+---------+-----------+----------+--------------+ FV Prox  Full                                                        +---------+---------------+---------+-----------+----------+--------------+ FV Mid   Full                                                        +---------+---------------+---------+-----------+----------+--------------+ FV DistalFull                                                        +---------+---------------+---------+-----------+----------+--------------+ PFV      Full                                                        +---------+---------------+---------+-----------+----------+--------------+ POP      Full           Yes      Yes                                  +---------+---------------+---------+-----------+----------+--------------+  PTV      Full                                                        +---------+---------------+---------+-----------+----------+--------------+ PERO     Full                                                        +---------+---------------+---------+-----------+----------+--------------+     Summary: RIGHT: - There is no evidence of deep vein thrombosis in the lower extremity.  - No cystic structure found in the popliteal fossa.  LEFT: - There is no evidence of deep vein thrombosis in the lower extremity.  - No cystic structure found in the popliteal fossa.  *See table(s) above for measurements and observations. Electronically signed by Harold Barban MD on 01/10/2021 at 8:06:44 PM.    Final    US Abdomen Limited RUQ (LIVER/GB)  Result Date: 01/08/2021 CLINICAL DATA:  Right pleural effusion and suspected cirrhosis. EXAM: ULTRASOUND ABDOMEN LIMITED RIGHT UPPER QUADRANT COMPARISON:  None. FINDINGS: Gallbladder: No gallstones or wall thickening visualized. No sonographic Murphy sign noted by sonographer. Common bile duct: Diameter: 4 mm Liver: There is slight heterogeneity of the liver echogenicity with coarsened echotexture and mild surface irregularity, consistent with changes of cirrhosis. No discrete mass identified. Portal vein is patent on color Doppler imaging with normal direction of blood flow towards the liver. Other: Partially visualized right pleural effusion. IMPRESSION: 1. Cirrhosis. 2. Patent main portal vein with hepatopetal flow. 3. No ascites. 4. Right pleural effusion. Electronically Signed   By: Anner Crete M.D.   On: 01/08/2021 19:51   US THORACENTESIS ASP PLEURAL SPACE W/IMG GUIDE  Result Date: 01/10/2021 INDICATION: Patient with history of lung and prostate cancers, bilateral pleural effusions, cirrhosis by imaging; request received for diagnostic and therapeutic right thoracentesis. EXAM:  ULTRASOUND GUIDED DIAGNOSTIC AND THERAPEUTIC RIGHT THORACENTESIS MEDICATIONS: 1% lidocaine to skin and subcutaneous tissue COMPLICATIONS: None immediate. PROCEDURE: An ultrasound guided thoracentesis was thoroughly discussed with the patient/daughter and questions answered. The benefits, risks, alternatives and complications were also discussed. The patient understands and wishes to proceed with the procedure. Written consent was obtained. Ultrasound was performed to localize and mark an adequate pocket of fluid in the right chest. The area was then prepped and draped in the normal sterile fashion. 1% Lidocaine was used for local anesthesia. Under ultrasound guidance a 6 Fr Safe-T-Centesis catheter was introduced. Thoracentesis was performed. The catheter was removed and a dressing applied. FINDINGS: A total of approximately 520 cc of amber/blood-tinged fluid was removed. Samples were sent to the laboratory as requested by the clinical team. Due to patient chest discomfort only the above amount of fluid was removed today. IMPRESSION: Successful ultrasound guided diagnostic and therapeutic right thoracentesis yielding 520 cc of pleural fluid. Read by: Rowe Robert, PA-C Electronically Signed   By: Markus Daft M.D.   On: 01/10/2021 12:41   US THORACENTESIS ASP PLEURAL SPACE W/IMG GUIDE  Result Date: 01/10/2021 INDICATION: Patient with history of non-small cell lung cancer, left pleural effusion; request for left thoracentesis EXAM: ULTRASOUND GUIDED DIAGNOSTIC AND THERAPEUTIC LEFT THORACENTESIS MEDICATIONS: 5 ML 1% LIDOCAINE COMPLICATIONS: None immediate.  PROCEDURE: An ultrasound guided thoracentesis was thoroughly discussed with the patient and questions answered. The benefits, risks, alternatives and complications were also discussed. The patient understands and wishes to proceed with the procedure. Written consent was obtained. Ultrasound was performed to localize and mark an adequate pocket of fluid in the left  chest. The area was then prepped and draped in the normal sterile fashion. 1% Lidocaine was used for local anesthesia. Under ultrasound guidance a 6 Fr Safe-T-Centesis catheter was introduced. Thoracentesis was performed. The catheter was removed and a dressing applied. FINDINGS: A total of approximately 1.4 L of blood-tinged, amber fluid was removed. Samples were sent to the laboratory as requested by the clinical team. IMPRESSION: Successful ultrasound guided diagnostic and therapeutic left thoracentesis yielding 1.4 L of pleural fluid. Read by: Narda Rutherford, NP Electronically Signed   By: Lucrezia Europe M.D.   On: 01/09/2021 15:40     Assessment and Plan:   1. Recurrent pleural effusions - s/p multiple thoracentesis procedures, unrevealing from cytology standpoint thus far but awaiting cytology on right thoracentesis from 8/24 - appreciate pulm input - pending ANA, RF, CCP, DS DNA and Quant Gold - PCCM also recommended IM workup of cirrhosis (? Could this be hepatic hydrothorax) - has been receiving low dose IV Lasix (not much BP room to aggressively diurese) - will discuss cardiology role with MD  2. Aortic stenosis - will discuss echocardiogram result with MD - difficult to interpret study due to poor windows and visualization  3. Persistent atrial fibrillation - currently rate controlled on metoprolol 25mg  BID - on heparin in lieu of [place] in case further procedures needed - TSH wnl 09/2020 - continue rate control strategy  4. Aortic atherosclerosis/coronary calcification - on statin at baseline - no ischemic sx otherwise  Remainder of medical issues per primary team.   Risk Assessment/Risk Scores:          CHA2DS2-VASc Score = 5  This indicates a 7.2% annual risk of stroke. The patient's score is based upon: CHF History: No HTN History: Yes Diabetes History: Yes Stroke History: No Vascular Disease History: Yes - aortic/cor calcificiation Age Score: 2 Gender Score: 0          For questions or updates, please contact [place] Please consult [email or web address] for contact info under    Signed, DAMION KANT, PA-C  01/11/2021 12:33 PM

## 2021-01-11 NOTE — Progress Notes (Signed)
NAME:  Adam Briggs, MRN:  [id number], DOB:  01/12/1930, LOS: 2 ADMISSION DATE:  01/08/2021, CONSULTATION DATE:  01/08/2021  REFERRING MD:  Dr Sarajane Jews, CHIEF COMPLAINT:  Recurrent right pleural effusion  Primary pulmonologist Dr. Leory Plowman Icard   brief   87 year old gentleman former smoker 17 pack smoking history quit in 1968.  Has past medical history of acid reflux, diabetes, hypertension hyperlipidemia and prostate cancer.,  Eliquis for atrial fibrillation admitted in May 2022 with a loculated right-sided pleural effusion with pleural thickening.  Underwent pigtail catheter placement after thoracentesis end of April 2022.  Effusion at this time showed an LDH of 134 and borderline exudate.  After the procedures he had ex vacuo pneumothorax but this pleural effusion was nondiagnostic he was also found to have a left-sided upper lobe nodule with mediastinal adenopathy.  In the middle of May 2022 he had a PET scan with a hypermetabolic left upper lobe lung mass with an SUV of 25.9 and hypermetabolic subcarinal nodes.  Underwent bronchoscopy end of May 2022.  He had atypical cells but otherwise nondiagnostic.  He was referred for empiric radiation SBRT and plans for follow-up of the right sided loculated hydropneumothorax.  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 Lisbeth Renshaw in radiation oncology mid June 2020 and plan for radiotherapy 3-5 fractions to the left upper lobe tumor and with surveillance of the right pleural effusion and mediastinal adenopathy.  He completed radiation therapy 11/23/2020.  He followed up with radiation oncology on 01/01/2021.  Part of this visit he had a chest x-ray.  Chest x-ray showed worsening of the large right-sided pleural effusion.  He also is reporting increased dyspnea.  He was referred to pulmonary.  On 01/05/2021 he underwent thoracentesis by pulmonary 500 cc of amber-colored fluid was drained.  ?  Results  sent to cytology/chemistry [currently not available in chart]  However on the day of admission 01/08/2021 wife called back saying is complaining of cough with white sputum and wheezing and worsening shortness of breath and pulse ox dropping to 84% with exertion 92% at rest.  Tachycardic at 135.  Admitted by the hospitalist with chest x-ray [personally visualized] showing worsening effusion pleural effusion on the right side and therefore pulmonary consulted  Daughter is frustrated by the recurrent nature of the pleural effusion and also the idiopathic nondiagnostic nature of the pleural effusion.  She is frustrated that no specific etiology has been found.  She is frustrated that it is recurring.  She is frustrated that only 5 cc was drained and after that it was dry.  She is wondering about transfer to either [facility] or [facility].  She believes patient is quite functional and is much younger biologically than stated age.  She is also reporting edema of the feet  Pertinent  Medical History    has a past medical history of Atrial fibrillation ([place]), BENIGN PROSTATIC HYPERTROPHY, Cataract, DIVERTICULOSIS, COLON, DM, GERD, Heart murmur, Hernia, HYPERCHOLESTEROLEMIA, HYPERTENSION, Lung cancer ([facility]), Moderate aortic stenosis (10/30/2011), Prostate cancer ([place]), and Recurrent right pleural effusion.   has a past surgical history that includes Inguinal hernia repair; Radioactive seed implant; Cataract extraction w/ intraocular lens implant (02/2013); Inguinal hernia repair (Left, 07/15/2014); Insertion of mesh (Left, 07/15/2014); Video bronchoscopy with endobronchial navigation (Left, 10/17/2020); Video bronchoscopy with endobronchial ultrasound (Bilateral, 10/17/2020); Bronchial biopsy (10/17/2020); Bronchial brushings (10/17/2020); Bronchial needle aspiration biopsy (10/17/2020); Bronchial washings (10/17/2020); Fiducial marker placement (10/17/2020);  and Thoracentesis (N/A, 01/05/2021).    Significant  Hospital Events: Including procedures, antibiotic start and stop dates in addition to other pertinent events   8/19 Rt Thora - 500cc. Chronic inflammation but non diagnostic. cytology No chemistry 01/08/2021: Admit 8/23 - still on o2 and feeling dyspenic. RUQ US showing cirrhosis but seems no evidence of portal hypertension. CT with R loculated effusion and left moderate sized effusion that is worse. Personally visualized ECHO in summer with some reduced EF. No furhter echo. 24h ur protein collection in progress 24h ur prot  - normal RUQ [place] with cirrhosis + but portal veins ok. NO ASCITES Echo - good ef but poor windows (few months earlier had moderate AS and reassured by Dr Percival Spanish Left thora 1.5L - likely transudate Cytology - REACTIVE. NON DIAGNOSTIC 8/24 - R Thora (chronic loculated since spring 2022) - 500cc and stopped due t pain. Borderline transudate/exudate. Cytology pending.  Duplex lower extremity for DVT negative  Interim History / Subjective:   01/11/2021 -no real change.  Daughter at the bedside.  Had right thoracentesis yesterday.  The fluid looks borderline transudate/exudate.  Cytology is pending.  The right-sided fluid on personal visualization of imaging has been loculated and persistent at least since April 2022 [prior to that previous film was clear in 2013 many years ago].  Even back in April 2022 had a left-sided effusion.  Therefore bilateral effusions are chronic.  Daughter is frustrated by the presence of pleural effusion and the morbidity discussing the patient.  She is also frustrated by the lack of etiology.  She is requesting evaluation at [place] via internal inpatient transfer.  Advised that transfer might not be possible due to bed situation and lack of acceptance by the receiving facility.  She wants with primary service hospitalist to try for this.  She is not enthusiastic about excepting outpatient consultation to thoracic surgery and pulmonary at  [facility].  She has upcoming follow-up with Dr. [name]  Objective   Blood pressure 104/64, pulse 69, temperature 97.8 F (36.6 C), resp. rate 20, height 5\' 10"  (1.778 m), weight 72.6 kg, SpO2 100 %.        Intake/Output Summary (Last 24 hours) at 01/11/2021 1107 Last data filed at 01/11/2021 0900 Gross per 24 hour  Intake --  Output 700 ml  Net -700 ml   Filed Weights   01/08/21 1629  Weight: 72.6 kg   Overall really no change.  Is a frail male in bed.  Alert.  Pleasant.  Clear to auscultation anteriorly.  Abdomen soft normal heart sounds.  Answering questions.  Looks deconditioned.  Is on 1 to 2 L oxygen   LABS    PULMONARY No results for input(s): PHART, PCO2ART, PO2ART, HCO3, TCO2, O2SAT in the last 168 hours.  Invalid input(s): PCO2, PO2  CBC Recent Labs  Lab 01/08/21 1256 01/10/21 1335 01/11/21 0448  HGB 12.1* 11.4* 11.7*  HCT 39.5 38.4* 39.8  WBC 4.9 6.6 6.0  PLT 199 211 214    COAGULATION No results for input(s): INR in the last 168 hours.  CARDIAC  No results for input(s): TROPONINI in the last 168 hours. No results for input(s): PROBNP in the last 168 hours.   CHEMISTRY Recent Labs  Lab 01/08/21 1256 01/09/21 0452  NA 140 137  K 3.7 4.0  CL 99 96*  CO2 30 33*  GLUCOSE 152* 97  BUN 27* 27*  CREATININE 0.91 0.98  CALCIUM 8.8* 8.5*   Estimated Creatinine Clearance:  52.5 mL/min (by C-G formula based on SCr of 0.98 mg/dL).   LIVER Recent Labs  Lab 01/08/21 1256  AST 15  ALT 16  ALKPHOS 57  BILITOT 0.8  PROT 6.7  ALBUMIN 3.4*     INFECTIOUS No results for input(s): LATICACIDVEN, PROCALCITON in the last 168 hours.   ENDOCRINE CBG (last 3)  No results for input(s): GLUCAP in the last 72 hours.       IMAGING x48h  - image(s) personally visualized  -   highlighted in bold DG Chest 1 View  Result Date: 01/10/2021 CLINICAL DATA:  Status post right thoracentesis. EXAM: CHEST  1 VIEW COMPARISON:  01/09/2021 FINDINGS: Large  loculated right pleural effusion is again identified. There has been slight decrease in volume of the right pleural effusion with mildly improved aeration to the right mid lung. No definite pneumothorax identified. Small left pleural effusion appears unchanged. IMPRESSION: 1. Slight decrease in volume of large loculated right pleural effusion status post thoracentesis. 2. No pneumothorax. Electronically Signed   By: Kerby Moors M.D.   On: 01/10/2021 13:02   DG Chest 1 View  Result Date: 01/09/2021 CLINICAL DATA:  Status post left thoracentesis EXAM: CHEST  1 VIEW COMPARISON:  01/08/2021 FINDINGS: Single frontal view of the chest demonstrates near complete resolution of the left pleural effusion after interval left-sided thoracentesis. No evidence of left-sided pneumothorax. Minimal consolidation at the left lung base consistent with residual atelectasis. There is persistent loculated right pleural effusion consistent with known empyema. Multifocal consolidation throughout the right lung is stable. Cardiac silhouette is unchanged. No acute bony abnormalities. IMPRESSION: 1. No complication after left-sided thoracentesis. Near complete resolution of left pleural effusion. 2. Stable known right empyema and underlying right lung consolidation. Electronically Signed   By: Randa Ngo M.D.   On: 01/09/2021 15:57   ECHOCARDIOGRAM COMPLETE  Result Date: 01/09/2021    ECHOCARDIOGRAM REPORT   Patient Name:   KAHLEN MORAIS Date of Exam: 01/09/2021 Medical Rec #:  [id number]     Height:       70.0 in Accession #:    [id number]    Weight:       160.0 lb Date of Birth:  Jun 30, 1929     BSA:          1.898 m Patient Age:    49 years      BP:           118/68 mmHg Patient Gender: M             HR:           82 bpm. Exam Location:  Inpatient Procedure: 2D Echo, Cardiac Doppler and Color Doppler Indications:    Cardiomyopathy-Unspecified I42.9  History:        Patient has prior history of Echocardiogram examinations, most                  recent 09/20/2020. Aortic Valve Disease; Arrythmias:Atrial                 Fibrillation. Non-small cell lung cancer. Right pneumothorax.                 Acute hypoxemic respiratory failure.  Sonographer:    Darlina Sicilian RDCS Referring Phys: [phone] [facility]  Sonographer Comments: Suboptimal parasternal window, suboptimal apical window and suboptimal subcostal window. Image acquisition challenging due to respiratory motion. IMPRESSIONS  1. Largely nondiagnostic study due to poor acoustic windows for imaging. Limited findings as noted  below.  2. Left ventricular ejection fraction, by estimation, is 65 to 70%. The left ventricle has normal function. Left ventricular diastolic parameters are indeterminate.  3. Large pleural effusion.  4. The mitral valve is degenerative.  5. The aortic valve is abnormal. FINDINGS  Left Ventricle: Left ventricular ejection fraction, by estimation, is 65 to 70%. The left ventricle has normal function. Definity contrast agent was given IV to delineate the left ventricular endocardial borders. Left ventricular diastolic parameters are indeterminate. Mitral Valve: The mitral valve is degenerative in appearance. Aortic Valve: The aortic valve is abnormal. Additional Comments: There is a large pleural effusion.  LEFT VENTRICLE PLAX 2D LVOT diam:     1.60 cm LVOT Area:     2.01 cm   AORTA Ao Root diam: 3.00 cm MITRAL VALVE MV Area (PHT): 4.30 cm     SHUNTS MV Decel Time: 176 msec     Systemic Diam: 1.60 cm MV E velocity: 109.33 cm/s Cherlynn Kaiser MD Electronically signed by Cherlynn Kaiser MD Signature Date/Time: 01/09/2021/5:29:15 PM    Final    VAS [place] LOWER EXTREMITY VENOUS (DVT)  Result Date: 01/10/2021  Lower Venous DVT Study Patient Name:  CHRISTOPHERE HILLHOUSE  Date of Exam:   01/10/2021 Medical Rec #: [id number]      Accession #:    [id number] Date of Birth: [birth year]      Patient Gender: M Patient Age:   26 years Exam Location:  [facility] Procedure:      VAS  [place] LOWER EXTREMITY VENOUS (DVT) Referring Phys: Noemi Ishmael --------------------------------------------------------------------------------  Indications: Edema.  Risk Factors: None identified. Comparison Study: No prior studies. Performing Technologist: Oliver Hum RVT  Examination Guidelines: A complete evaluation includes B-mode imaging, spectral Doppler, color Doppler, and power Doppler as needed of all accessible portions of each vessel. Bilateral testing is considered an integral part of a complete examination. Limited examinations for reoccurring indications may be performed as noted. The reflux portion of the exam is performed with the patient in reverse Trendelenburg.  +---------+---------------+---------+-----------+----------+--------------+ RIGHT    CompressibilityPhasicitySpontaneityPropertiesThrombus Aging +---------+---------------+---------+-----------+----------+--------------+ CFV      Full           Yes      Yes                                 +---------+---------------+---------+-----------+----------+--------------+ SFJ      Full                                                        +---------+---------------+---------+-----------+----------+--------------+ FV Prox  Full                                                        +---------+---------------+---------+-----------+----------+--------------+ FV Mid   Full                                                        +---------+---------------+---------+-----------+----------+--------------+ FV  DistalFull                                                        +---------+---------------+---------+-----------+----------+--------------+ PFV      Full                                                        +---------+---------------+---------+-----------+----------+--------------+ POP      Full           Yes      Yes                                  +---------+---------------+---------+-----------+----------+--------------+ PTV      Full                                                        +---------+---------------+---------+-----------+----------+--------------+ PERO     Full                                                        +---------+---------------+---------+-----------+----------+--------------+   +---------+---------------+---------+-----------+----------+--------------+ LEFT     CompressibilityPhasicitySpontaneityPropertiesThrombus Aging +---------+---------------+---------+-----------+----------+--------------+ CFV      Full           Yes      Yes                                 +---------+---------------+---------+-----------+----------+--------------+ SFJ      Full                                                        +---------+---------------+---------+-----------+----------+--------------+ FV Prox  Full                                                        +---------+---------------+---------+-----------+----------+--------------+ FV Mid   Full                                                        +---------+---------------+---------+-----------+----------+--------------+ FV DistalFull                                                        +---------+---------------+---------+-----------+----------+--------------+  PFV      Full                                                        +---------+---------------+---------+-----------+----------+--------------+ POP      Full           Yes      Yes                                 +---------+---------------+---------+-----------+----------+--------------+ PTV      Full                                                        +---------+---------------+---------+-----------+----------+--------------+ PERO     Full                                                         +---------+---------------+---------+-----------+----------+--------------+     Summary: RIGHT: - There is no evidence of deep vein thrombosis in the lower extremity.  - No cystic structure found in the popliteal fossa.  LEFT: - There is no evidence of deep vein thrombosis in the lower extremity.  - No cystic structure found in the popliteal fossa.  *See table(s) above for measurements and observations. Electronically signed by Harold Barban MD on 01/10/2021 at 8:06:44 PM.    Final    US THORACENTESIS ASP PLEURAL SPACE W/IMG GUIDE  Result Date: 01/10/2021 INDICATION: Patient with history of lung and prostate cancers, bilateral pleural effusions, cirrhosis by imaging; request received for diagnostic and therapeutic right thoracentesis. EXAM: ULTRASOUND GUIDED DIAGNOSTIC AND THERAPEUTIC RIGHT THORACENTESIS MEDICATIONS: 1% lidocaine to skin and subcutaneous tissue COMPLICATIONS: None immediate. PROCEDURE: An ultrasound guided thoracentesis was thoroughly discussed with the patient/daughter and questions answered. The benefits, risks, alternatives and complications were also discussed. The patient understands and wishes to proceed with the procedure. Written consent was obtained. Ultrasound was performed to localize and mark an adequate pocket of fluid in the right chest. The area was then prepped and draped in the normal sterile fashion. 1% Lidocaine was used for local anesthesia. Under ultrasound guidance a 6 Fr Safe-T-Centesis catheter was introduced. Thoracentesis was performed. The catheter was removed and a dressing applied. FINDINGS: A total of approximately 520 cc of amber/blood-tinged fluid was removed. Samples were sent to the laboratory as requested by the clinical team. Due to patient chest discomfort only the above amount of fluid was removed today. IMPRESSION: Successful ultrasound guided diagnostic and therapeutic right thoracentesis yielding 520 cc of pleural fluid. Read by: Rowe Robert, PA-C  Electronically Signed   By: Markus Daft M.D.   On: 01/10/2021 12:41   US THORACENTESIS ASP PLEURAL SPACE W/IMG GUIDE  Result Date: 01/10/2021 INDICATION: Patient with history of non-small cell lung cancer, left pleural effusion; request for left thoracentesis EXAM: ULTRASOUND GUIDED DIAGNOSTIC AND THERAPEUTIC LEFT THORACENTESIS MEDICATIONS: 5 ML 1% LIDOCAINE COMPLICATIONS: None immediate. PROCEDURE: An ultrasound guided thoracentesis was thoroughly discussed with the  patient and questions answered. The benefits, risks, alternatives and complications were also discussed. The patient understands and wishes to proceed with the procedure. Written consent was obtained. Ultrasound was performed to localize and mark an adequate pocket of fluid in the left chest. The area was then prepped and draped in the normal sterile fashion. 1% Lidocaine was used for local anesthesia. Under ultrasound guidance a 6 Fr Safe-T-Centesis catheter was introduced. Thoracentesis was performed. The catheter was removed and a dressing applied. FINDINGS: A total of approximately 1.4 L of blood-tinged, amber fluid was removed. Samples were sent to the laboratory as requested by the clinical team. IMPRESSION: Successful ultrasound guided diagnostic and therapeutic left thoracentesis yielding 1.4 L of pleural fluid. Read by: Narda Rutherford, NP Electronically Signed   By: Lucrezia Europe M.D.   On: 01/09/2021 15:40       Resolved Hospital Problem list   X  Assessment & Plan:  [place] prior to and at admit  - mediastinal adenopathy and stage Ia left upper lobe putative early in onset small cell lung cancer status post radiation   Persistent/recurrent loculated right pleural effusion -since April/May 2022.  Borderline exudate in April 2022 with nondiagnostic cytology in April 2022 and 01/05/21  - Currently this admit on CT - loculated and poersistent  -Repeat Thora done on the right side 01/10/2021: Borderline transudate exudate.  Cytology  pending  Plan  - Await cytology on the right sided effusion from 01/10/2021 -Explained to the daughter if etiology is definitely sought then will need thoracic surgery consultation for VATS examination.  At this point in time she wants to hold off on getting this done internally at [facility] but seek an external transfer   New worsening Left pleural effusion at admit 01/08/2021 on CT [also present in April 2022]  - thora 01/09/21 - likely transudate/borderline exudate RUQ [place] with evidence of cirrhosis -  - possible etiology for effusion but no portal hyperrension = ECHO with possible and likely aortic stenosis - 24h ur prot is normal this admission -Nondiagnostic transudate 01/09/2021 with reactive cells  Plan - Cirrhosis wkrup - defer to [facility] - needs Hep panel   -d/w Dr Tana Coast - Possible mode - severe AS on echo 01/09/21 - d/w Dr Myrla Halsted to consider TEE or cards consult  -  AWait ANA, RF, CCP, DS DNA and Quant Gold 01/10/21   =  Aucte hypoxemic resp fialure - mild, present on admit, due to above  01/11/2021- improved from 5L [place] at admit  but needing o2 1-2L [place]  Plan  - o2 for pulse ox > 88%   Followup - Transfer of care to [facility] or [facility] either as inpatient [place] bed availability and can be challenging] versus outpatient referral (she declined this on 01/11/2021 though offered to assist)  - PCCM available as needed  - Keep OPD followup with Dr [name]  Future Appointments  Date Time Provider [place]  01/25/2021  2:30 PM Icard, Octavio Graves, DO [facility] None  01/25/2021  3:30 PM [facility] CCM PHARMACIST 2 [facility] None  10/24/2021 11:35 AM [facility] ECHO 1 [facility]      D/w Dr Jiles Garter    Dr. Brand Males, M.D., F.C.C.P,  Pulmonary and Critical Care Medicine Staff Physician, [place] Director - Interstitial Lung Disease  Program  Pulmonary [place] at  [place], [place], [address]  NPI Number:  NPI #[id number]  Pager: [phone], If no answer  -> Check AMION or Try [phone] Telephone (clinical office): [phone] Telephone (research): [phone]  11:07 AM 01/11/2021

## 2021-01-12 DIAGNOSIS — J9601 Acute respiratory failure with hypoxia: Secondary | ICD-10-CM | POA: Diagnosis not present

## 2021-01-12 DIAGNOSIS — I480 Paroxysmal atrial fibrillation: Secondary | ICD-10-CM | POA: Diagnosis not present

## 2021-01-12 DIAGNOSIS — J9383 Other pneumothorax: Secondary | ICD-10-CM | POA: Diagnosis not present

## 2021-01-12 DIAGNOSIS — R0600 Dyspnea, unspecified: Secondary | ICD-10-CM | POA: Diagnosis not present

## 2021-01-12 LAB — CBC
HCT: 39.9 % (ref 39.0–52.0)
Hemoglobin: 11.7 g/dL — ABNORMAL LOW (ref 13.0–17.0)
MCH: 25.3 pg — ABNORMAL LOW (ref 26.0–34.0)
MCHC: 29.3 g/dL — ABNORMAL LOW (ref 30.0–36.0)
MCV: 86.4 fL (ref 80.0–100.0)
Platelets: 187 10*3/uL (ref 150–400)
RBC: 4.62 MIL/uL (ref 4.22–5.81)
RDW: 15.3 % (ref 11.5–15.5)
WBC: 5.6 10*3/uL (ref 4.0–10.5)
nRBC: 0 % (ref 0.0–0.2)

## 2021-01-12 LAB — BASIC METABOLIC PANEL
Anion gap: 6 (ref 5–15)
BUN: 32 mg/dL — ABNORMAL HIGH (ref 8–23)
CO2: 37 mmol/L — ABNORMAL HIGH (ref 22–32)
Calcium: 8.8 mg/dL — ABNORMAL LOW (ref 8.9–10.3)
Chloride: 95 mmol/L — ABNORMAL LOW (ref 98–111)
Creatinine, Ser: 0.83 mg/dL (ref 0.61–1.24)
GFR, Estimated: >60 mL/min (ref >60)
Glucose, Bld: 120 mg/dL — ABNORMAL HIGH (ref 70–99)
Potassium: 4.8 mmol/L (ref 3.5–5.1)
Sodium: 138 mmol/L (ref 135–145)

## 2021-01-12 LAB — CYCLIC CITRUL PEPTIDE ANTIBODY, IGG/IGA: CCP Antibodies IgG/IgA: 5 units (ref 0–19)

## 2021-01-12 LAB — APTT: aPTT: 88 seconds — ABNORMAL HIGH (ref 24–36)

## 2021-01-12 LAB — PROTIME-INR
INR: 1.1 (ref 0.8–1.2)
Prothrombin Time: 13.7 seconds (ref 11.4–15.2)

## 2021-01-12 LAB — HEPARIN LEVEL (UNFRACTIONATED): Heparin Unfractionated: 0.45 IU/mL (ref 0.30–0.70)

## 2021-01-12 MED ORDER — APIXABAN 5 MG PO TABS
5.0000 mg | ORAL_TABLET | Freq: Two times a day (BID) | ORAL | Status: DC
  Administered 2021-01-12 – 2021-01-16 (×8): 5 mg via ORAL
  Filled 2021-01-12 (×9): qty 1

## 2021-01-12 NOTE — Progress Notes (Signed)
ANTICOAGULATION CONSULT NOTE - Initial Consult  Pharmacy Consult for heparin Indication: atrial fibrillation; apixaban on hold  Allergies  Allergen Reactions   Lisinopril Rash    Patient Measurements: Height: 5\' 10"  (177.8 cm) Weight: 72.6 kg (160 lb) IBW/kg (Calculated) : 73 Heparin Dosing Weight: n/a. Use TBW = 73 kg  Vital Signs: Temp: 99.4 F (37.4 C) (08/26 0545) Temp Source: Oral (08/26 0545) BP: 122/61 (08/26 0545) Pulse Rate: 100 (08/26 0545)  Labs: Recent Labs    01/10/21 1335 01/11/21 0448 01/11/21 1543 01/12/21 0521  HGB 11.4* 11.7*  --  11.7*  HCT 38.4* 39.8  --  39.9  PLT 211 214  --  187  APTT 30  --  62*  --   HEPARINUNFRC 0.44  --  0.32 0.45  CREATININE  --   --   --  0.83     Estimated Creatinine Clearance: 62 mL/min (by C-G formula based on SCr of 0.83 mg/dL).   Medical History: Past Medical History:  Diagnosis Date   Atrial fibrillation ([place])    BENIGN PROSTATIC HYPERTROPHY    Cataract    DIVERTICULOSIS, COLON    DM    not diabetic-has been off metformin for about 2 years as of 09/2020 pr daughter   GERD    Heart murmur    Hernia    HYPERCHOLESTEROLEMIA    HYPERTENSION    Lung cancer ([place])    Moderate aortic stenosis 10/30/2011   echo 2013   Prostate cancer ([place])    s/p seed implant   Recurrent right pleural effusion     Medications: Apixaban 5 mg PO BID PTA for atrial fibrillation -Last dose: 8/22 @ 8:00 AM   Assessment: Pt is an 67 yoM prescribed apixaban PTA for atrial fibrillation. PMH significant for pleural effusion, s/p thoracentesis on 8/19 PTA. Apixaban held on admission in anticipation for possible procedures. Pt underwent thoracentesis again on 8/23 & 8/24.   Pharmacy consulted to dose IV heparin for anticoagulation while DOAC on hold.   Significant Events: -8/23: Thoracentesis (1.4 L blood tinged, amber fluid) -8/24: Thoracentesis (520 mL amber/blood-tinged fluid)  Today, 01/12/21 HL = 0.45, aPTT = 88 seconds  both therapeutic on heparin infusion of 1150 units/hr CBC: Hgb (11.7) slightly low but stable; Plt WNL Confirmed with RN that heparin infusing at correct rate. No interruptions/line issues. No signs of bleeding.   Baseline HL on 8/24 was falsely elevated due to recent DOAC. HL and aPTT are correlating in therapeutic range this morning, will transition to monitoring using HL only.  Goal of Therapy:  aPTT 66 - 102 seconds Heparin level 0.3-0.7 units/ml Monitor platelets by anticoagulation protocol: Yes   Plan:  Continue heparin infusion at current rate of 1150 units/hr  Check confirmatory 8 hour HL HL, CBC daily while on heparin infusion Monitor for signs of bleeding Follow for ability to transition back to PO anticoagulation  If additional procedures required, providers will need to give guidance on when to hold heparin drip pre-procedure.  Lenis Noon, PharmD 01/12/2021,7:36 AM

## 2021-01-12 NOTE — Progress Notes (Signed)
[facility]                                                                              Patient Demographics  Adam Briggs, is a 87 y.o. male, DOB - February 19, 1930, HQP:[id number]  Admit date - 01/08/2021   Admitting Physician Samuella Cota, MD  Outpatient Primary MD for the patient is [name], MD  Outpatient specialists:   LOS - 3  days   Medical records reviewed and are as summarized below:    Chief Complaint  Patient presents with   Shortness of Breath       Brief summary   87 year old male with a history of NSCLC left upper lobe, completed XRT, recurrent right-sided loculated hydropneumothorax, underwent outpatient left thoracentesis for rec pleural effusion per PCCM on 8/19 with removal of 500 cc.  Patient was admitted for acute hypoxic respiratory failure secondary to recurrent increasing loculated pleural effusion.    Assessment & Plan    New worsening left pleural effusion, acute hypoxic respiratory failure loculated right lateral pleural effusion, recurrent -Seen on CT 8/22, underwent left thoracentesis on 8/23 (also underwent thoracentesis right on 8/24) -Removed 1.4 L of blood-tinged fluid from left thoracentesis -Improving, O2 sats 99% on 2 L, initially had required 5 L -Continue IV Lasix 20 mg twice daily, titrated up on 8/25 -- Unclear etiology for the recurrent pleural effusions, transudative (patient has NSCLC malignant neoplasm of the LUL lung, completed XRT 11/23/2020, radonc Dr. Lisbeth Renshaw, primary pulmonologist, Dr. [name]) - Cytology 8/23 showed reactive mesothelial cells, numerous lymphoid cells -Appreciate cardiology and GI opinion.  Per cardiology, pleural effusions not from cardiac source.  Discussed in detail with GI, Dr. Rush Landmark, does not appear cirrhosis is the cause for bilateral pleural effusion, no ascites, no portal hypertension LFTs normal, INR normal, no thrombocytopenia.  There could be concern for idiopathic  cryptogenic cirrhosis however rare to have only pleural effusion without any underlying liver failure. Recommended trial of increasing diuretics.  Lasix increased to 20 mg IV twice daily.   -Patient's daughter had requested transfer to tertiary care   Loculated right lateral pleural effusion, recurrent -Since April/May 2022, nondiagnostic cytology in 08/2020 and 01/05/2021, loculated and persistent -Underwent thoracentesis right lung on 8/24, 520 cc removed -Pulmonology following, appreciate recommendations -See above regarding cardiology and GI opinion  Cirrhosis, noted on RUQ [place]: New diagnosis -2D echo showed EF of 65 to 70%, aortic stenosis.  Previously has been recommended TEE, doubt able to do TEE with his respiratory status -24-hour urine protein normal -Ultrasound showed no ascites, patent main portal vein, no portal hypertension, ?  Passive -see #1, appreciate GI recommendations.  Increased Lasix to 20 mg IV twice daily -Upon discharge, will place on Lasix 20 mg daily, spironolactone 50 mg daily and titrate up with BP and as tolerated  Possible aortic stenosis on echo 01/09/2021 -Previously has been recommended TEE -Patient followed by Dr. Percival Spanish, office note on 11/03/2020 had noted moderate aortic stenosis and repeat echo in 1 year.  No acute concerns were noted. -Per cardiology, no acute issues and bilateral pleural effusions are likely from cardiac source   Persistent  atrial fibrillation -Heart rate controlled, continue metoprolol 25 twice daily -Will resume Eliquis as no procedures planned  BPH -Continue tamsulosin  History of NSCLC -Outpatient follow-up with pulmonology, radiation oncology, oncology  Essential hypertension -Continue beta-blocker, Lasix  Hyperlipidemia -Continue statin  Estimated body mass index is 22.96 kg/m as calculated from the following:   Height as of this encounter: 5\' 10"  (1.778 m).   Weight as of this encounter: 72.6 kg.  Code Status:  Full code DVT Prophylaxis:  SCDs Start: 01/08/21 1749   Level of Care: Level of care: Med-Surg Family Communication: Discussed all imaging results, lab results, explained to the patient and daughter at the bedside.   Per family's request made calls to both [facility] and [facility] Call placed at 10:45 am to [facility], received response at 10:54 AM that there is critical bed shortage and they are unable to accept any medicine patients Call placed to [facility] at 11:01 AM, spoke with transfer line coordinator, Juliann Pulse, faxed demographic facesheet and requested radiology to power share the radiology imagings to [facility]. Received response at 12:12 PM from [facility], discussed in detail with Dr. Jeraldine Loots, currently has 10 patients on waiting list who has not moved in 1 week.  Recommended outpatient work-up and referral to [facility] pulmonology due to the chronicity of the bilateral pleural effusions.  They are not able to accept inpatient transfer at this time. I called outpatient [facility] pulmonology however they need referral, request sent to patient's outpatient pulmonologist, Dr. [name] for referral.   Disposition Plan:     Status is: Inpatient  Remains inpatient appropriate because:Inpatient level of care appropriate due to severity of illness  Dispo: The patient is from: Home              Anticipated d/c is to: Home              Patient currently is not medically stable to d/c.  Plan for PT OT and assess improvement with IV diuretics.  Hopefully will   Difficult to place patient No      Time Spent in minutes 35 minutes  Procedures:  Thoracentesis left, 1.4 L removed on 8/23 Ultrasound thoracentesis, right 8/24 with 520 cc removed  Consultants:   Pulmonology Cardiology GI  Antimicrobials:   Anti-infectives (From admission, onward)    None          Medications  Scheduled Meds:  furosemide  20 mg Intravenous BID    metoprolol tartrate  25 mg Oral BID   polyethylene glycol  17 g Oral BID   senna-docusate  1 tablet Oral QHS   simvastatin  20 mg Oral q1800   sodium chloride flush  3 mL Intravenous Q12H   tamsulosin  0.4 mg Oral q AM   Continuous Infusions:  sodium chloride     heparin 1,150 Units/hr (01/12/21 0607)   PRN Meds:.sodium chloride, acetaminophen **OR** acetaminophen, alum & mag hydroxide-simeth, fluticasone, levalbuterol, ondansetron **OR** ondansetron (ZOFRAN) IV, sodium chloride flush      Subjective:   Adam Briggs was seen and examined today.  No complaints from the patient, was able to ambulate to go to the bathroom yesterday, daughter at the bedside.  No chest pain or fevers.  No significant shortness of breath upon rest.  No acute events overnight. Objective:   Vitals:   01/11/21 2125 01/12/21 0545 01/12/21 0755 01/12/21 0756  BP: 128/70 122/61  112/60  Pulse: 99 100 99  Resp: 20 16    Temp: 98 F (36.7 C) 99.4 F (37.4 C)    TempSrc: Oral Oral    SpO2: 100% 100%  99%  Weight:      Height:        Intake/Output Summary (Last 24 hours) at 01/12/2021 1055 Last data filed at 01/12/2021 0900 Gross per 24 hour  Intake 675.42 ml  Output --  Net 675.42 ml     Wt Readings from Last 3 Encounters:  01/08/21 72.6 kg  11/27/20 74.1 kg  11/03/20 73.8 kg   Physical Exam General: Alert and oriented x 3, NAD, frail Cardiovascular: Irregular 1+ pedal edema bilaterally Respiratory: Diminished at the bases Gastrointestinal: Soft, nontender, nondistended, NBS Ext: 1+ pedal edema bilaterally Neuro: no new deficits Skin: No rashes Psych: Normal affect and demeanor, alert and oriented x3     Data Reviewed:  I have personally reviewed following labs and imaging studies  Micro Results Recent Results (from the past 240 hour(s))  Resp Panel by RT-PCR (Flu A&B, Covid) Nasopharyngeal Swab     Status: None   Collection Time: 01/08/21  1:02 PM   Specimen: Nasopharyngeal Swab;  Nasopharyngeal(NP) swabs in vial transport medium  Result Value Ref Range Status   SARS Coronavirus 2 by RT PCR NEGATIVE NEGATIVE Final    Comment: (NOTE) SARS-CoV-2 target nucleic acids are NOT DETECTED.  The SARS-CoV-2 RNA is generally detectable in upper respiratory specimens during the acute phase of infection. The lowest concentration of SARS-CoV-2 viral copies this assay can detect is 138 copies/mL. A negative result does not preclude SARS-Cov-2 infection and should not be used as the sole basis for treatment or other patient management decisions. A negative result may occur with  improper specimen collection/handling, submission of specimen other than nasopharyngeal swab, presence of viral mutation(s) within the areas targeted by this assay, and inadequate number of viral copies(<138 copies/mL). A negative result must be combined with clinical observations, patient history, and epidemiological information. The expected result is Negative.  Fact Sheet for Patients:  EntrepreneurPulse.com.au  Fact Sheet for Healthcare Providers:  IncredibleEmployment.be  This test is no t yet approved or cleared by the [place] FDA and  has been authorized for detection and/or diagnosis of SARS-CoV-2 by FDA under an Emergency Use Authorization (EUA). This EUA will remain  in effect (meaning this test can be used) for the duration of the COVID-19 declaration under Section 564(b)(1) of the Act, 21 U.S.C.section 360bbb-3(b)(1), unless the authorization is terminated  or revoked sooner.       Influenza A by PCR NEGATIVE NEGATIVE Final   Influenza B by PCR NEGATIVE NEGATIVE Final    Comment: (NOTE) The Xpert Xpress SARS-CoV-2/FLU/RSV plus assay is intended as an aid in the diagnosis of influenza from Nasopharyngeal swab specimens and should not be used as a sole basis for treatment. Nasal washings and aspirates are unacceptable for Xpert Xpress  SARS-CoV-2/FLU/RSV testing.  Fact Sheet for Patients: EntrepreneurPulse.com.au  Fact Sheet for Healthcare Providers: IncredibleEmployment.be  This test is not yet approved or cleared by the [place] FDA and has been authorized for detection and/or diagnosis of SARS-CoV-2 by FDA under an Emergency Use Authorization (EUA). This EUA will remain in effect (meaning this test can be used) for the duration of the COVID-19 declaration under Section 564(b)(1) of the Act, 21 U.S.C. section 360bbb-3(b)(1), unless the authorization is terminated or revoked.  Performed at [facility], [place] [address], [place], [place] [address]   Body fluid  culture w Gram Stain     Status: None (Preliminary result)   Collection Time: 01/09/21  3:43 PM   Specimen: PATH Cytology Pleural fluid  Result Value Ref Range Status   Specimen Description   Final    PLEURAL Performed at [facility] [address], [place], [place] [address]    Special Requests   Final    NONE Performed at [facility], [place] [address] [place], [place] [address]    Gram Stain   Final    RARE WBC PRESENT,BOTH PMN AND MONONUCLEAR NO ORGANISMS SEEN    Culture   Final    NO GROWTH 3 DAYS Performed at [facility], [place] [address], [place], [place] [address]    Report Status PENDING  Incomplete  Culture, body fluid w Gram Stain-bottle     Status: None (Preliminary result)   Collection Time: 01/10/21 12:21 PM   Specimen: Fluid  Result Value Ref Range Status   Specimen Description FLUID PLEURAL  Final   Special Requests BOTTLES DRAWN AEROBIC AND ANAEROBIC  Final   Culture   Final    NO GROWTH 2 DAYS Performed at [facility], [place] [address], [place], [place] [address]    Report Status PENDING  Incomplete  Gram stain     Status: None   Collection Time: 01/10/21 12:21 PM   Specimen: Fluid  Result Value Ref Range Status    Specimen Description FLUID PLEURAL  Final   Special Requests NONE  Final   Gram Stain   Final    FEW SQUAMOUS EPITHELIAL CELLS PRESENT MODERATE WBC PRESENT, PREDOMINANTLY MONONUCLEAR NO ORGANISMS SEEN Performed at [facility], [place] [address] [place], [place] [address]    Report Status 01/11/2021 FINAL  Final    Radiology Reports DG Chest 1 View  Result Date: 01/10/2021 CLINICAL DATA:  Status post right thoracentesis. EXAM: CHEST  1 VIEW COMPARISON:  01/09/2021 FINDINGS: Large loculated right pleural effusion is again identified. There has been slight decrease in volume of the right pleural effusion with mildly improved aeration to the right mid lung. No definite pneumothorax identified. Small left pleural effusion appears unchanged. IMPRESSION: 1. Slight decrease in volume of large loculated right pleural effusion status post thoracentesis. 2. No pneumothorax. Electronically Signed   By: Kerby Moors M.D.   On: 01/10/2021 13:02   DG Chest 1 View  Result Date: 01/09/2021 CLINICAL DATA:  Status post left thoracentesis EXAM: CHEST  1 VIEW COMPARISON:  01/08/2021 FINDINGS: Single frontal view of the chest demonstrates near complete resolution of the left pleural effusion after interval left-sided thoracentesis. No evidence of left-sided pneumothorax. Minimal consolidation at the left lung base consistent with residual atelectasis. There is persistent loculated right pleural effusion consistent with known empyema. Multifocal consolidation throughout the right lung is stable. Cardiac silhouette is unchanged. No acute bony abnormalities. IMPRESSION: 1. No complication after left-sided thoracentesis. Near complete resolution of left pleural effusion. 2. Stable known right empyema and underlying right lung consolidation. Electronically Signed   By: Randa Ngo M.D.   On: 01/09/2021 15:57   DG Chest 2 View  Result Date: 01/08/2021 CLINICAL DATA:  Increased shortness of breath EXAM: CHEST - 2  VIEW COMPARISON:  Chest x-ray dated January 05, 2021 FINDINGS: Visualized cardiac and mediastinal contours are unchanged. Increased size of moderate to large loculated right pleural effusion. Persistent right lung consolidation. Small left pleural effusion. No pneumothorax. IMPRESSION: Increased size of moderate to  large loculated right pleural effusion when compared with most recent prior x-ray. Increased size of small left pleural effusion. Electronically Signed   By: Yetta Glassman M.D.   On: 01/08/2021 13:56   DG Chest 2 View  Result Date: 01/02/2021 CLINICAL DATA:  Dyspnea. EXAM: CHEST - 2 VIEW COMPARISON:  November 27, 2020. FINDINGS: Stable cardiomediastinal silhouette. No definite pneumothorax is noted. Stable large probable loculated right pleural effusion is noted extending from the apex to the base along the lateral wall. Atelectasis or infiltrates are noted in the aerated portions of the right lung. Minimal left basilar subsegmental atelectasis is noted. Bony thorax is unremarkable. IMPRESSION: Stable large loculated right pleural effusion is noted. Atelectasis or infiltrates are noted in the aerated portions of the right lung. Minimal left basilar subsegmental atelectasis. Electronically Signed   By: Marijo Conception M.D.   On: 01/02/2021 16:53   CT CHEST WO CONTRAST  Result Date: 01/08/2021 CLINICAL DATA:  Non-small cell lung cancer. Status post radiotherapy. Recurrent right-sided loculated hydropneumothorax. EXAM: CT CHEST WITHOUT CONTRAST TECHNIQUE: Multidetector CT imaging of the chest was performed following the standard protocol without IV contrast. COMPARISON:  10/02/2020 FINDINGS: Cardiovascular: Moderate cardiac enlargement. No pericardial effusion identified. Aortic atherosclerosis. Coronary artery calcifications. Mediastinum/Nodes: Normal appearance of the thyroid gland. The trachea appears patent and is midline. Normal appearance of the esophagus. No enlarged axillary lymph nodes.  Multiple prominent mediastinal lymph nodes are again identified. None of these meet CT criteria for adenopathy. Index nodes include: -left pre-vascular lymph node measures 1.1 cm, image 58/2. Unchanged from previous exam. Right paratracheal lymph node measures 1.1 cm, image 46/2. Previously 0.8 cm. Hilar lymph nodes are suboptimally evaluated due to lack of IV contrast material. Lungs/Pleura: Moderate loculated right pleural effusion is again noted and does not appear significantly improved in volume when compared with 10/02/2020. Decrease gas within this fluid collection noted compared with 10/02/2020. Significantly diminished aeration to the right lung. Overlying areas of pleural thickening and presumed rounded atelectasis noted within the right middle lobe and right lower lobe. Multiple areas of subsegmental atelectasis also noted within the aerated portions of the right lung. Moderate to large left pleural effusion has increased in volume from the previous exam. Treated tumor within the anteromedial left upper lobe measures 1.7 cm, image 39/5. Previously 1.8 cm. Patchy areas of ground-glass and airspace density within the anterior left upper lobe is nonspecific, but favored to represent sequelae of external beam radiation. New atelectasis of the inferior lingula, image 114/5. Upper Abdomen: No acute findings within the imaged portions of the upper abdomen. Aortic atherosclerosis noted. Musculoskeletal: Spondylosis within the thoracic spine. No acute or significant osseous findings. IMPRESSION: 1. Stable appearance of treated tumor within the anteromedial left upper lobe. Adjacent area of ground-glass and airspace density is favored to represent sequelae of external beam radiation. 2. Large left pleural effusion has significantly increased in volume when compared with 10/02/2020. 3. No significant change in volume of loculated right pleural effusion, previously characterized as empyema. Volume of gas within the  right pleural fluid collection has decreased in the interval. 4. As before there is significantly diminished aeration to the right lung. Similar to previous exam is diffuse pleural thickening overlying the loculated right pleural fluid collection with areas of presumed rounded atelectasis as well as segmental and subsegmental atelectasis. 5. Aortic atherosclerosis and coronary artery calcifications. Aortic Atherosclerosis (ICD10-I70.0). Electronically Signed   By: Kerby Moors M.D.   On: 01/08/2021 19:01   DG CHEST PORT  1 VIEW  Result Date: 01/05/2021 CLINICAL DATA:  Right thoracentesis, short of breath EXAM: PORTABLE CHEST 1 VIEW COMPARISON:  01/02/2021, 10/02/2020 FINDINGS: Single frontal view of the chest demonstrates slight decrease in the right pleural effusion seen previously. Underlying right lung consolidation and pleural thickening again noted and unchanged. No evidence of postprocedural pneumothorax. Fiduciary marker identified overlying the left anterior first rib at the site of the known left upper lobe pulmonary nodule. Trace left pleural effusion versus pleural thickening identified. There are no acute bony abnormalities. Cardiac silhouette is stable. IMPRESSION: 1. Slight decrease in loculated right pleural fluid after thoracentesis. No evidence of postprocedural complication. 2. Persistent right lung consolidation and pleural thickening compatible with previous history of empyema. 3. Fiduciary marker left upper lobe compatible with known pulmonary nodule. Electronically Signed   By: Randa Ngo M.D.   On: 01/05/2021 15:49   ECHOCARDIOGRAM COMPLETE  Result Date: 01/09/2021    ECHOCARDIOGRAM REPORT   Patient Name:   Adam Briggs Date of Exam: 01/09/2021 Medical Rec #:  [id number]     Height:       70.0 in Accession #:    [id number]    Weight:       160.0 lb Date of Birth:  10-10-1929     BSA:          1.898 m Patient Age:    44 years      BP:           118/68 mmHg Patient Gender: M              HR:           82 bpm. Exam Location:  Inpatient Procedure: 2D Echo, Cardiac Doppler and Color Doppler Indications:    Cardiomyopathy-Unspecified I42.9  History:        Patient has prior history of Echocardiogram examinations, most                 recent 09/20/2020. Aortic Valve Disease; Arrythmias:Atrial                 Fibrillation. Non-small cell lung cancer. Right pneumothorax.                 Acute hypoxemic respiratory failure.  Sonographer:    Darlina Sicilian RDCS Referring Phys: [phone] [facility]  Sonographer Comments: Suboptimal parasternal window, suboptimal apical window and suboptimal subcostal window. Image acquisition challenging due to respiratory motion. IMPRESSIONS  1. Largely nondiagnostic study due to poor acoustic windows for imaging. Limited findings as noted below.  2. Left ventricular ejection fraction, by estimation, is 65 to 70%. The left ventricle has normal function. Left ventricular diastolic parameters are indeterminate.  3. Large pleural effusion.  4. The mitral valve is degenerative.  5. The aortic valve is abnormal. FINDINGS  Left Ventricle: Left ventricular ejection fraction, by estimation, is 65 to 70%. The left ventricle has normal function. Definity contrast agent was given IV to delineate the left ventricular endocardial borders. Left ventricular diastolic parameters are indeterminate. Mitral Valve: The mitral valve is degenerative in appearance. Aortic Valve: The aortic valve is abnormal. Additional Comments: There is a large pleural effusion.  LEFT VENTRICLE PLAX 2D LVOT diam:     1.60 cm LVOT Area:     2.01 cm   AORTA Ao Root diam: 3.00 cm MITRAL VALVE MV Area (PHT): 4.30 cm     SHUNTS MV Decel Time: 176 msec     Systemic Diam: 1.60 cm MV E velocity:  109.33 cm/s Cherlynn Kaiser MD Electronically signed by Cherlynn Kaiser MD Signature Date/Time: 01/09/2021/5:29:15 PM    Final    VAS [place] LOWER EXTREMITY VENOUS (DVT)  Result Date: 01/10/2021  Lower Venous DVT Study Patient  Name:  Adam Briggs  Date of Exam:   01/10/2021 Medical Rec #: [id number]      Accession #:    [id number] Date of Birth: 1929-08-14      Patient Gender: M Patient Age:   59 years Exam Location:  [facility] Procedure:      VAS [place] LOWER EXTREMITY VENOUS (DVT) Referring Phys: MURALI RAMASWAMY --------------------------------------------------------------------------------  Indications: Edema.  Risk Factors: None identified. Comparison Study: No prior studies. Performing Technologist: Oliver Hum RVT  Examination Guidelines: A complete evaluation includes B-mode imaging, spectral Doppler, color Doppler, and power Doppler as needed of all accessible portions of each vessel. Bilateral testing is considered an integral part of a complete examination. Limited examinations for reoccurring indications may be performed as noted. The reflux portion of the exam is performed with the patient in reverse Trendelenburg.  +---------+---------------+---------+-----------+----------+--------------+ RIGHT    CompressibilityPhasicitySpontaneityPropertiesThrombus Aging +---------+---------------+---------+-----------+----------+--------------+ CFV      Full           Yes      Yes                                 +---------+---------------+---------+-----------+----------+--------------+ SFJ      Full                                                        +---------+---------------+---------+-----------+----------+--------------+ FV Prox  Full                                                        +---------+---------------+---------+-----------+----------+--------------+ FV Mid   Full                                                        +---------+---------------+---------+-----------+----------+--------------+ FV DistalFull                                                        +---------+---------------+---------+-----------+----------+--------------+ PFV      Full                                                         +---------+---------------+---------+-----------+----------+--------------+ POP      Full           Yes      Yes                                 +---------+---------------+---------+-----------+----------+--------------+  PTV      Full                                                        +---------+---------------+---------+-----------+----------+--------------+ PERO     Full                                                        +---------+---------------+---------+-----------+----------+--------------+   +---------+---------------+---------+-----------+----------+--------------+ LEFT     CompressibilityPhasicitySpontaneityPropertiesThrombus Aging +---------+---------------+---------+-----------+----------+--------------+ CFV      Full           Yes      Yes                                 +---------+---------------+---------+-----------+----------+--------------+ SFJ      Full                                                        +---------+---------------+---------+-----------+----------+--------------+ FV Prox  Full                                                        +---------+---------------+---------+-----------+----------+--------------+ FV Mid   Full                                                        +---------+---------------+---------+-----------+----------+--------------+ FV DistalFull                                                        +---------+---------------+---------+-----------+----------+--------------+ PFV      Full                                                        +---------+---------------+---------+-----------+----------+--------------+ POP      Full           Yes      Yes                                 +---------+---------------+---------+-----------+----------+--------------+ PTV      Full                                                         +---------+---------------+---------+-----------+----------+--------------+  PERO     Full                                                        +---------+---------------+---------+-----------+----------+--------------+     Summary: RIGHT: - There is no evidence of deep vein thrombosis in the lower extremity.  - No cystic structure found in the popliteal fossa.  LEFT: - There is no evidence of deep vein thrombosis in the lower extremity.  - No cystic structure found in the popliteal fossa.  *See table(s) above for measurements and observations. Electronically signed by Harold Barban MD on 01/10/2021 at 8:06:44 PM.    Final    US Abdomen Limited RUQ (LIVER/GB)  Result Date: 01/08/2021 CLINICAL DATA:  Right pleural effusion and suspected cirrhosis. EXAM: ULTRASOUND ABDOMEN LIMITED RIGHT UPPER QUADRANT COMPARISON:  None. FINDINGS: Gallbladder: No gallstones or wall thickening visualized. No sonographic Murphy sign noted by sonographer. Common bile duct: Diameter: 4 mm Liver: There is slight heterogeneity of the liver echogenicity with coarsened echotexture and mild surface irregularity, consistent with changes of cirrhosis. No discrete mass identified. Portal vein is patent on color Doppler imaging with normal direction of blood flow towards the liver. Other: Partially visualized right pleural effusion. IMPRESSION: 1. Cirrhosis. 2. Patent main portal vein with hepatopetal flow. 3. No ascites. 4. Right pleural effusion. Electronically Signed   By: Anner Crete M.D.   On: 01/08/2021 19:51   US THORACENTESIS ASP PLEURAL SPACE W/IMG GUIDE  Result Date: 01/10/2021 INDICATION: Patient with history of lung and prostate cancers, bilateral pleural effusions, cirrhosis by imaging; request received for diagnostic and therapeutic right thoracentesis. EXAM: ULTRASOUND GUIDED DIAGNOSTIC AND THERAPEUTIC RIGHT THORACENTESIS MEDICATIONS: 1% lidocaine to skin and subcutaneous tissue COMPLICATIONS: None immediate.  PROCEDURE: An ultrasound guided thoracentesis was thoroughly discussed with the patient/daughter and questions answered. The benefits, risks, alternatives and complications were also discussed. The patient understands and wishes to proceed with the procedure. Written consent was obtained. Ultrasound was performed to localize and mark an adequate pocket of fluid in the right chest. The area was then prepped and draped in the normal sterile fashion. 1% Lidocaine was used for local anesthesia. Under ultrasound guidance a 6 Fr Safe-T-Centesis catheter was introduced. Thoracentesis was performed. The catheter was removed and a dressing applied. FINDINGS: A total of approximately 520 cc of amber/blood-tinged fluid was removed. Samples were sent to the laboratory as requested by the clinical team. Due to patient chest discomfort only the above amount of fluid was removed today. IMPRESSION: Successful ultrasound guided diagnostic and therapeutic right thoracentesis yielding 520 cc of pleural fluid. Read by: Rowe Robert, PA-C Electronically Signed   By: Markus Daft M.D.   On: 01/10/2021 12:41   US THORACENTESIS ASP PLEURAL SPACE W/IMG GUIDE  Result Date: 01/10/2021 INDICATION: Patient with history of non-small cell lung cancer, left pleural effusion; request for left thoracentesis EXAM: ULTRASOUND GUIDED DIAGNOSTIC AND THERAPEUTIC LEFT THORACENTESIS MEDICATIONS: 5 ML 1% LIDOCAINE COMPLICATIONS: None immediate. PROCEDURE: An ultrasound guided thoracentesis was thoroughly discussed with the patient and questions answered. The benefits, risks, alternatives and complications were also discussed. The patient understands and wishes to proceed with the procedure. Written consent was obtained. Ultrasound was performed to localize and mark an adequate pocket of fluid in the left chest. The area was then prepped and draped in the normal  sterile fashion. 1% Lidocaine was used for local anesthesia. Under ultrasound guidance a 6 Fr  Safe-T-Centesis catheter was introduced. Thoracentesis was performed. The catheter was removed and a dressing applied. FINDINGS: A total of approximately 1.4 L of blood-tinged, amber fluid was removed. Samples were sent to the laboratory as requested by the clinical team. IMPRESSION: Successful ultrasound guided diagnostic and therapeutic left thoracentesis yielding 1.4 L of pleural fluid. Read by: Narda Rutherford, NP Electronically Signed   By: Lucrezia Europe M.D.   On: 01/09/2021 15:40    Lab Data:  CBC: Recent Labs  Lab 01/08/21 1256 01/10/21 1335 01/11/21 0448 01/12/21 0521  WBC 4.9 6.6 6.0 5.6  NEUTROABS 3.9  --   --   --   HGB 12.1* 11.4* 11.7* 11.7*  HCT 39.5 38.4* 39.8 39.9  MCV 82.6 85.7 85.6 86.4  PLT 199 211 214 353   Basic Metabolic Panel: Recent Labs  Lab 01/08/21 1256 01/09/21 0452 01/12/21 0521  NA 140 137 138  K 3.7 4.0 4.8  CL 99 96* 95*  CO2 30 33* 37*  GLUCOSE 152* 97 120*  BUN 27* 27* 32*  CREATININE 0.91 0.98 0.83  CALCIUM 8.8* 8.5* 8.8*   GFR: Estimated Creatinine Clearance: 62 mL/min (by C-G formula based on SCr of 0.83 mg/dL). Liver Function Tests: Recent Labs  Lab 01/08/21 1256  AST 15  ALT 16  ALKPHOS 57  BILITOT 0.8  PROT 6.7  ALBUMIN 3.4*   No results for input(s): LIPASE, AMYLASE in the last 168 hours. No results for input(s): AMMONIA in the last 168 hours. Coagulation Profile: Recent Labs  Lab 01/12/21 0521  INR 1.1   Cardiac Enzymes: No results for input(s): CKTOTAL, CKMB, CKMBINDEX, TROPONINI in the last 168 hours. BNP (last 3 results) Recent Labs    09/15/20 1132  PROBNP 238.0*   HbA1C: No results for input(s): HGBA1C in the last 72 hours. CBG: No results for input(s): GLUCAP in the last 168 hours. Lipid Profile: No results for input(s): CHOL, HDL, LDLCALC, TRIG, CHOLHDL, LDLDIRECT in the last 72 hours. Thyroid Function Tests: No results for input(s): TSH, T4TOTAL, FREET4, T3FREE, THYROIDAB in the last 72 hours. Anemia  Panel: No results for input(s): VITAMINB12, FOLATE, FERRITIN, TIBC, IRON, RETICCTPCT in the last 72 hours. Urine analysis:    Component Value Date/Time   COLORURINE YELLOW 01/09/2021 1125   APPEARANCEUR CLEAR 01/09/2021 1125   LABSPEC 1.010 01/09/2021 1125   PHURINE 5.0 01/09/2021 1125   GLUCOSEU NEGATIVE 01/09/2021 1125   GLUCOSEU NEGATIVE 09/23/2011 1032   HGBUR NEGATIVE 01/09/2021 1125   HGBUR negative 09/26/2008 0908   BILIRUBINUR NEGATIVE 01/09/2021 1125   KETONESUR NEGATIVE 01/09/2021 1125   PROTEINUR 30 (A) 01/09/2021 1125   UROBILINOGEN 1.0 01/16/2012 1008   NITRITE NEGATIVE 01/09/2021 1125   LEUKOCYTESUR NEGATIVE 01/09/2021 1125     Jarrad Mclees M.D. [facility] 01/12/2021, 10:55 AM  Available via Epic secure chat 7am-7pm After 7 pm, please refer to night coverage provider listed on amion.

## 2021-01-12 NOTE — Progress Notes (Signed)
ANTICOAGULATION CONSULT NOTE   Pharmacy Consult for heparin > apixaban  Indication: atrial fibrillation  Allergies  Allergen Reactions   Lisinopril Rash    Patient Measurements: Height: 5\' 10"  (177.8 cm) Weight: 72.6 kg (160 lb) IBW/kg (Calculated) : 73 Heparin Dosing Weight: TBW = 72.6 kg  Vital Signs: Temp: 98.2 F (36.8 C) (08/26 1332) Temp Source: Oral (08/26 1332) BP: 106/68 (08/26 1332) Pulse Rate: 95 (08/26 1332)  Labs: Recent Labs    01/10/21 1335 01/11/21 0448 01/11/21 1543 01/12/21 0521  HGB 11.4* 11.7*  --  11.7*  HCT 38.4* 39.8  --  39.9  PLT 211 214  --  187  APTT 30  --  62* 88*  LABPROT  --   --   --  13.7  INR  --   --   --  1.1  HEPARINUNFRC 0.44  --  0.32 0.45  CREATININE  --   --   --  0.83     Estimated Creatinine Clearance: 62 mL/min (by C-G formula based on SCr of 0.83 mg/dL).   Medical History: Past Medical History:  Diagnosis Date   Atrial fibrillation ([place])    BENIGN PROSTATIC HYPERTROPHY    Cataract    DIVERTICULOSIS, COLON    DM    not diabetic-has been off metformin for about 2 years as of 09/2020 pr daughter   GERD    Heart murmur    Hernia    HYPERCHOLESTEROLEMIA    HYPERTENSION    Lung cancer ([place])    Moderate aortic stenosis 10/30/2011   echo 2013   Prostate cancer ([place])    s/p seed implant   Recurrent right pleural effusion     Medications: Apixaban 5 mg PO BID PTA for atrial fibrillation -Last dose: 8/22 @ 8:00 AM   Assessment: Pt is an 29 yoM prescribed apixaban PTA for atrial fibrillation. PMH significant for pleural effusion, s/p thoracentesis on 8/19 PTA. Apixaban held on admission in anticipation for possible procedures. Pt underwent thoracentesis again on 8/23 & 8/24. Pharmacy consulted to dose IV heparin for anticoagulation while DOAC on hold.   Significant Events: -8/23: Thoracentesis (1.4 L blood tinged, amber fluid) -8/24: Thoracentesis (520 mL amber/blood-tinged fluid)  Today, 01/12/21 AM heparin  level = 0.45 units/mL, aPTT = 88 seconds both therapeutic on heparin infusion of 1150 units/hr Baseline heparin level on 8/24 was falsely elevated due to recent DOAC. Heparin level and aPTT are correlating in therapeutic range this morning, will transition to monitoring using heparin levels only. CBC: Hgb (11.7) slightly low but stable; Plt WNL Confirmed with RN that heparin infusing at correct rate. No interruptions/line issues. No signs of bleeding.  SCr 0.83  PM update: No further procedures planned, so MD consulting pharmacy to transition back to Apixaban.   Goal of Therapy:  aPTT 66 - 102 seconds Heparin level 0.3-0.7 units/ml Monitor platelets by anticoagulation protocol: Yes   Plan:  At 1800 today, stop heparin infusion. At same time, resume Apixaban 5mg  PO BID.  Monitor renal function, CBC, and for s/sx of bleeding.  Pharmacy to sign off note writing. Will continue to monitor peripherally.    Lindell Spar, PharmD, BCPS Clinical Pharmacist  01/12/2021,2:54 PM

## 2021-01-12 NOTE — Progress Notes (Signed)
Pt was complaining of tightness in his abdomen.pt went to bathroom he had some gas and he said he felt much better; given prn maalox. Heparin drip is stopped .will continue monitor the pt.

## 2021-01-12 NOTE — Care Management Important Message (Signed)
Medicare IM printed for [facility] to give to the patient. 

## 2021-01-13 DIAGNOSIS — R0600 Dyspnea, unspecified: Secondary | ICD-10-CM | POA: Diagnosis not present

## 2021-01-13 DIAGNOSIS — I480 Paroxysmal atrial fibrillation: Secondary | ICD-10-CM | POA: Diagnosis not present

## 2021-01-13 DIAGNOSIS — J9601 Acute respiratory failure with hypoxia: Secondary | ICD-10-CM | POA: Diagnosis not present

## 2021-01-13 DIAGNOSIS — J9383 Other pneumothorax: Secondary | ICD-10-CM | POA: Diagnosis not present

## 2021-01-13 LAB — BODY FLUID CULTURE W GRAM STAIN: Culture: NO GROWTH

## 2021-01-13 LAB — CBC
HCT: 37.8 % — ABNORMAL LOW (ref 39.0–52.0)
Hemoglobin: 10.9 g/dL — ABNORMAL LOW (ref 13.0–17.0)
MCH: 25.1 pg — ABNORMAL LOW (ref 26.0–34.0)
MCHC: 28.8 g/dL — ABNORMAL LOW (ref 30.0–36.0)
MCV: 86.9 fL (ref 80.0–100.0)
Platelets: 174 10*3/uL (ref 150–400)
RBC: 4.35 MIL/uL (ref 4.22–5.81)
RDW: 15.1 % (ref 11.5–15.5)
WBC: 5.1 10*3/uL (ref 4.0–10.5)
nRBC: 0 % (ref 0.0–0.2)

## 2021-01-13 LAB — BASIC METABOLIC PANEL
Anion gap: 3 — ABNORMAL LOW (ref 5–15)
BUN: 30 mg/dL — ABNORMAL HIGH (ref 8–23)
CO2: 43 mmol/L — ABNORMAL HIGH (ref 22–32)
Calcium: 8.7 mg/dL — ABNORMAL LOW (ref 8.9–10.3)
Chloride: 94 mmol/L — ABNORMAL LOW (ref 98–111)
Creatinine, Ser: 0.8 mg/dL (ref 0.61–1.24)
GFR, Estimated: >60 mL/min (ref >60)
Glucose, Bld: 131 mg/dL — ABNORMAL HIGH (ref 70–99)
Potassium: 4.4 mmol/L (ref 3.5–5.1)
Sodium: 140 mmol/L (ref 135–145)

## 2021-01-13 MED ORDER — ALBUTEROL SULFATE HFA 108 (90 BASE) MCG/ACT IN AERS
2.0000 | INHALATION_SPRAY | Freq: Four times a day (QID) | RESPIRATORY_TRACT | Status: DC | PRN
  Filled 2021-01-13: qty 6.7

## 2021-01-13 MED ORDER — IPRATROPIUM-ALBUTEROL 0.5-2.5 (3) MG/3ML IN SOLN
3.0000 mL | Freq: Three times a day (TID) | RESPIRATORY_TRACT | Status: DC
  Administered 2021-01-13 – 2021-01-14 (×5): 3 mL via RESPIRATORY_TRACT
  Filled 2021-01-13 (×5): qty 3

## 2021-01-13 NOTE — Evaluation (Signed)
Physical Therapy Evaluation Patient Details Name: Adam Briggs MRN: [id number] DOB: 04/24/1930 Today's Date: 01/13/2021   History of Present Illness  Pt is 87 yo male admitted on 01/08/21 with acute resp failure secondary to recurrent loculated pleural effusion. Had L thoracentesis on 01/09/21 and R thoracentesis on 8/24.  Unclear etiology for recurrent pleural effusions (had GI and cardio consults) - plan is to transfer to tertiary facility next week for further testing. Pt with hs of NSCLC L upper lobe completed XRT, afib, DM, HTN, prostate CA.  Clinical Impression  Pt admitted with above diagnosis. At baseline, pt is independent with ADLs, IADLs, and ambulation without AD.  He does have some shortness of breath at times due to recurrent pleural effusions.  Pt lives near daughter who can provide 24 hr assist as needed.  Today, pt able to ambulate 69' with RW and min guard on 3 L O2 with sats 94%.  He did fatigue easily.  Will benefit from PT to progress but also encouraged ambulation with nursing staff.  Pt currently with functional limitations due to the deficits listed below (see PT Problem List). Pt will benefit from skilled PT to increase their independence and safety with mobility to allow discharge to the venue listed below.       Follow Up Recommendations Home health PT;Supervision for mobility/OOB    Equipment Recommendations  None recommended by PT    Recommendations for Other Services       Precautions / Restrictions Precautions Precautions: Fall      Mobility  Bed Mobility Overal bed mobility: Needs Assistance Bed Mobility: Supine to Sit     Supine to sit: Min assist          Transfers Overall transfer level: Needs assistance Equipment used: Rolling walker (2 wheeled) Transfers: Sit to/from Stand Sit to Stand: Min guard            Ambulation/Gait Ambulation/Gait assistance: Min guard Gait Distance (Feet): 75 Feet Assistive device: Rolling walker (2  wheeled) Gait Pattern/deviations: Step-through pattern;Decreased stride length Gait velocity: decreased   General Gait Details: Min guard for safety with VS monitored (see general comments); pt fatigued easily but had no overt LOB  Stairs            Wheelchair Mobility    Modified Rankin (Stroke Patients Only)       Balance Overall balance assessment: Needs assistance Sitting-balance support: No upper extremity supported Sitting balance-Leahy Scale: Good     Standing balance support: No upper extremity supported;Bilateral upper extremity supported Standing balance-Leahy Scale: Fair Standing balance comment: RW to ambulate but could static stand without AD                             Pertinent Vitals/Pain Pain Assessment: No/denies pain    Home Living Family/patient expects to be discharged to:: Private residence Living Arrangements: Alone Available Help at Discharge: Family;Available 24 hours/day (Pt lives alone but daughter stays with him full time when sick) Type of Home: House Home Access: Stairs to enter Entrance Stairs-Rails: None Entrance Stairs-Number of Steps: 2 Home Layout: One level Home Equipment: Shower seat;Grab bars - tub/shower;Hand held shower head;Grab bars - toilet;Cane - single point;Walker - 4 wheels      Prior Function     Gait / Transfers Assistance Needed: Ambulated without AD ; could ambulate short community distances - gets short of breath  ADL's / Homemaking Assistance Needed: Independent with ADLs  and IADLs; family assist as needed when sick        Hand Dominance        Extremity/Trunk Assessment   Upper Extremity Assessment Upper Extremity Assessment: RUE deficits/detail;LUE deficits/detail RUE Deficits / Details: ROM WFL; MMT 4/5 LUE Deficits / Details: ROM WFL; MMT 4/5    Lower Extremity Assessment Lower Extremity Assessment: LLE deficits/detail;RLE deficits/detail RLE Deficits / Details: ROM WFL; MMT  5/5 LLE Deficits / Details: ROM WFL; MMT 5/5    Cervical / Trunk Assessment Cervical / Trunk Assessment: Normal  Communication   Communication: HOH  Cognition Arousal/Alertness: Awake/alert Behavior During Therapy: WFL for tasks assessed/performed Overall Cognitive Status: Within Functional Limits for tasks assessed                                        General Comments General comments (skin integrity, edema, etc.): Pt on 3 L O2 with sats 97% rest and 94% walking. Encouraged ambulation with nursing staff in addition to PT.    Exercises     Assessment/Plan    PT Assessment Patient needs continued PT services  PT Problem List Decreased strength;Decreased mobility;Decreased activity tolerance;Cardiopulmonary status limiting activity;Decreased balance;Decreased knowledge of use of DME       PT Treatment Interventions DME instruction;Therapeutic activities;Gait training;Therapeutic exercise;Patient/family education;Stair training;Balance training;Functional mobility training    PT Goals (Current goals can be found in the Care Plan section)  Acute Rehab PT Goals Patient Stated Goal: return home; find out what is causing pleural effusions PT Goal Formulation: With patient/family Time For Goal Achievement: 01/27/21 Potential to Achieve Goals: Good    Frequency Min 3X/week   Barriers to discharge        Co-evaluation               AM-PAC PT "6 Clicks" Mobility  Outcome Measure Help needed turning from your back to your side while in a flat bed without using bedrails?: A Little Help needed moving from lying on your back to sitting on the side of a flat bed without using bedrails?: A Little Help needed moving to and from a bed to a chair (including a wheelchair)?: A Little Help needed standing up from a chair using your arms (e.g., wheelchair or bedside chair)?: A Little Help needed to walk in hospital room?: A Little   6 Click Score: 15    End of  Session Equipment Utilized During Treatment: Gait belt Activity Tolerance: Patient tolerated treatment well Patient left: with call bell/phone within reach;with chair alarm set;in chair Nurse Communication: Mobility status (Pt c/o buttock sore - noted redness and small break in skin) PT Visit Diagnosis: Other abnormalities of gait and mobility (R26.89);Muscle weakness (generalized) (M62.81)    Time: 1447-1510 PT Time Calculation (min) (ACUTE ONLY): 23 min   Charges:   PT Evaluation $PT Eval Low Complexity: 1 Low PT Treatments $Gait Training: 8-22 mins        Abran Richard, PT Acute Rehab Services Pager [phone] [facility] [place] 01/13/2021, 3:26 PM

## 2021-01-13 NOTE — Progress Notes (Signed)
[facility]                                                                              Patient Demographics  Adam Briggs, is a 87 y.o. male, DOB - 11/25/1929, LEX:[id number]  Admit date - 01/08/2021   Admitting Physician Samuella Cota, MD  Outpatient Primary MD for the patient is [name], MD  Outpatient specialists:   LOS - 4  days   Medical records reviewed and are as summarized below:    Chief Complaint  Patient presents with   Shortness of Breath       Brief summary   87 year old male with a history of NSCLC left upper lobe, completed XRT, recurrent right-sided loculated hydropneumothorax, underwent outpatient left thoracentesis for rec pleural effusion per PCCM on 8/19 with removal of 500 cc.  Patient was admitted for acute hypoxic respiratory failure secondary to recurrent increasing loculated pleural effusion.    Assessment & Plan    New worsening left pleural effusion, acute hypoxic respiratory failure loculated right lateral pleural effusion, recurrent -Seen on CT 8/22, underwent left thoracentesis on 8/23 (also underwent thoracentesis right on 8/24) -Removed 1.4 L of blood-tinged fluid from left thoracentesis -- Unclear etiology for the recurrent pleural effusions, transudative (patient has NSCLC malignant neoplasm of the LUL lung, completed XRT 11/23/2020, radonc Dr. Lisbeth Renshaw, primary pulmonologist, Dr. [name]) - Cytology 8/23 showed reactive mesothelial cells, numerous lymphoid cells -Appreciate cardiology and GI opinion.  Per cardiology, pleural effusions not from cardiac source.  Discussed in detail with GI, Dr. Rush Landmark, does not appear cirrhosis is the cause for bilateral pleural effusion, no ascites, no portal hypertension LFTs normal, INR normal, no thrombocytopenia.  There could be concern for idiopathic cryptogenic cirrhosis however rare to have only pleural effusion without any underlying liver failure. Recommended trial  of increasing diuretics.  Lasix increased to 20 mg IV twice daily.   -Continue IV Lasix 20 mg every 12 hours, negative balance of 272 cc.  Lower extremity edema improving.  Still feels short of breath on ambulation. -Home O2 evaluation prior to discharge   Loculated right lateral pleural effusion, recurrent -Since April/May 2022, nondiagnostic cytology in 08/2020 and 01/05/2021, loculated and persistent -Underwent thoracentesis right lung on 8/24, 520 cc removed -Pulmonology following, appreciate recommendations -See above regarding cardiology and GI opinion  Cirrhosis, noted on RUQ [place]: New diagnosis -2D echo showed EF of 65 to 70%, aortic stenosis.  Previously has been recommended TEE, doubt able to do TEE with his respiratory status -24-hour urine protein normal -Ultrasound showed no ascites, patent main portal vein, no portal hypertension, ?  Passive -see #1, appreciate GI recommendations.   -Continue Lasix 20 mg IV twice daily, renal function stable -Upon discharge, will place on Lasix 20 mg daily, spironolactone 50 mg daily and titrate up with BP and as tolerated  Possible aortic stenosis on echo 01/09/2021 -Previously has been recommended TEE -Patient followed by Dr. Percival Spanish, office note on 11/03/2020 had noted moderate aortic stenosis and repeat echo in 1 year.  No acute concerns were noted. -Per cardiology, no acute issues and bilateral pleural effusions are likely from cardiac  source   Persistent atrial fibrillation -Heart rate controlled, continue metoprolol 25 twice daily -Continue eliquis  BPH -Continue tamsulosin  History of NSCLC -Outpatient follow-up with pulmonology, radiation oncology, oncology  Essential hypertension -Continue beta-blocker, Lasix  Hyperlipidemia -Continue statin  Estimated body mass index is 22.96 kg/m as calculated from the following:   Height as of this encounter: 5\' 10"  (1.778 m).   Weight as of this encounter: 72.6 kg.  Code Status:  Full code DVT Prophylaxis:  SCDs Start: 01/08/21 1749 apixaban (ELIQUIS) tablet 5 mg   Level of Care: Level of care: Med-Surg Family Communication: Discussed all imaging results, lab results, explained to the patient and daughter at the bedside.   Patient was declined by both [facility] and [facility] on 8/26.  Pulmonology to send a referral to [facility] pulmonary clinic for appointment.  Disposition Plan:     Status is: Inpatient  Remains inpatient appropriate because:Inpatient level of care appropriate due to severity of illness  Dispo: The patient is from: Home              Anticipated d/c is to: Home              Patient currently is not medically stable to d/c.     Difficult to place patient No      Time Spent in minutes 25 minutes  Procedures:  Thoracentesis left, 1.4 L removed on 8/23 Ultrasound thoracentesis, right 8/24 with 520 cc removed  Consultants:   Pulmonology Cardiology GI  Antimicrobials:   Anti-infectives (From admission, onward)    None          Medications  Scheduled Meds:  apixaban  5 mg Oral BID   furosemide  20 mg Intravenous BID   metoprolol tartrate  25 mg Oral BID   polyethylene glycol  17 g Oral BID   senna-docusate  1 tablet Oral QHS   simvastatin  20 mg Oral q1800   sodium chloride flush  3 mL Intravenous Q12H   tamsulosin  0.4 mg Oral q AM   Continuous Infusions:  sodium chloride     PRN Meds:.sodium chloride, acetaminophen **OR** acetaminophen, alum & mag hydroxide-simeth, fluticasone, levalbuterol, ondansetron **OR** ondansetron (ZOFRAN) IV, sodium chloride flush      Subjective:   Adam Briggs was seen and examined today.  No acute complaints, was watching NASCAR last night.  States feels short of breath when walking otherwise no acute issues.  No chest pain, no fevers or chills.   Objective:   Vitals:   01/12/21 2022 01/13/21 0419 01/13/21 1016 01/13/21 1216  BP: 119/67 117/62 132/70 124/75  Pulse: 85 86  (!) 115 98  Resp: 20 20  17   Temp: 97.6 F (36.4 C) 97.9 F (36.6 C) 98.8 F (37.1 C) 98 F (36.7 C)  TempSrc: Oral Oral Oral Oral  SpO2: 100% 100%  98%  Weight:      Height:        Intake/Output Summary (Last 24 hours) at 01/13/2021 1311 Last data filed at 01/13/2021 0900 Gross per 24 hour  Intake 720 ml  Output 702 ml  Net 18 ml     Wt Readings from Last 3 Encounters:  01/08/21 72.6 kg  11/27/20 74.1 kg  11/03/20 73.8 kg   Physical Exam General: Alert and oriented x 3, NAD Cardiovascular: Irregular Respiratory: Diminished breath sound at the bases with mild scattered wheezing Gastrointestinal: Soft, nontender, nondistended, NBS Ext: trace pedal edema bilaterally, improving Psych: Normal affect and  demeanor, alert and oriented x3    Data Reviewed:  I have personally reviewed following labs and imaging studies  Micro Results Recent Results (from the past 240 hour(s))  Resp Panel by RT-PCR (Flu A&B, Covid) Nasopharyngeal Swab     Status: None   Collection Time: 01/08/21  1:02 PM   Specimen: Nasopharyngeal Swab; Nasopharyngeal(NP) swabs in vial transport medium  Result Value Ref Range Status   SARS Coronavirus 2 by RT PCR NEGATIVE NEGATIVE Final    Comment: (NOTE) SARS-CoV-2 target nucleic acids are NOT DETECTED.  The SARS-CoV-2 RNA is generally detectable in upper respiratory specimens during the acute phase of infection. The lowest concentration of SARS-CoV-2 viral copies this assay can detect is 138 copies/mL. A negative result does not preclude SARS-Cov-2 infection and should not be used as the sole basis for treatment or other patient management decisions. A negative result may occur with  improper specimen collection/handling, submission of specimen other than nasopharyngeal swab, presence of viral mutation(s) within the areas targeted by this assay, and inadequate number of viral copies(<138 copies/mL). A negative result must be combined with clinical  observations, patient history, and epidemiological information. The expected result is Negative.  Fact Sheet for Patients:  EntrepreneurPulse.com.au  Fact Sheet for Healthcare Providers:  IncredibleEmployment.be  This test is no t yet approved or cleared by the [place] FDA and  has been authorized for detection and/or diagnosis of SARS-CoV-2 by FDA under an Emergency Use Authorization (EUA). This EUA will remain  in effect (meaning this test can be used) for the duration of the COVID-19 declaration under Section 564(b)(1) of the Act, 21 U.S.C.section 360bbb-3(b)(1), unless the authorization is terminated  or revoked sooner.       Influenza A by PCR NEGATIVE NEGATIVE Final   Influenza B by PCR NEGATIVE NEGATIVE Final    Comment: (NOTE) The Xpert Xpress SARS-CoV-2/FLU/RSV plus assay is intended as an aid in the diagnosis of influenza from Nasopharyngeal swab specimens and should not be used as a sole basis for treatment. Nasal washings and aspirates are unacceptable for Xpert Xpress SARS-CoV-2/FLU/RSV testing.  Fact Sheet for Patients: EntrepreneurPulse.com.au  Fact Sheet for Healthcare Providers: IncredibleEmployment.be  This test is not yet approved or cleared by the [place] FDA and has been authorized for detection and/or diagnosis of SARS-CoV-2 by FDA under an Emergency Use Authorization (EUA). This EUA will remain in effect (meaning this test can be used) for the duration of the COVID-19 declaration under Section 564(b)(1) of the Act, 21 U.S.C. section 360bbb-3(b)(1), unless the authorization is terminated or revoked.  Performed at [facility], [place] [address], [place], [place] [address]   Body fluid culture w Gram Stain     Status: None   Collection Time: 01/09/21  3:43 PM   Specimen: PATH Cytology Pleural fluid  Result Value Ref Range Status   Specimen Description    Final    PLEURAL Performed at [place] [address], [place],  [address]    Special Requests   Final    NONE Performed at [facility], [place] [address] [place], [place] [address]    Gram Stain   Final    RARE WBC PRESENT,BOTH PMN AND MONONUCLEAR NO ORGANISMS SEEN    Culture   Final    NO GROWTH 3 DAYS Performed at [facility], [place] [address] [place],  [address]    Report Status 01/13/2021 FINAL  Final  Culture, body fluid  w Gram Stain-bottle     Status: None (Preliminary result)   Collection Time: 01/10/21 12:21 PM   Specimen: Fluid  Result Value Ref Range Status   Specimen Description FLUID PLEURAL  Final   Special Requests BOTTLES DRAWN AEROBIC AND ANAEROBIC  Final   Culture   Final    NO GROWTH 3 DAYS Performed at [facility], [place] [address] [place], [place] [address]    Report Status PENDING  Incomplete  Gram stain     Status: None   Collection Time: 01/10/21 12:21 PM   Specimen: Fluid  Result Value Ref Range Status   Specimen Description FLUID PLEURAL  Final   Special Requests NONE  Final   Gram Stain   Final    FEW SQUAMOUS EPITHELIAL CELLS PRESENT MODERATE WBC PRESENT, PREDOMINANTLY MONONUCLEAR NO ORGANISMS SEEN Performed at [facility], [place] [address] [place], [place] [address]    Report Status 01/11/2021 FINAL  Final    Radiology Reports DG Chest 1 View  Result Date: 01/10/2021 CLINICAL DATA:  Status post right thoracentesis. EXAM: CHEST  1 VIEW COMPARISON:  01/09/2021 FINDINGS: Large loculated right pleural effusion is again identified. There has been slight decrease in volume of the right pleural effusion with mildly improved aeration to the right mid lung. No definite pneumothorax identified. Small left pleural effusion appears unchanged. IMPRESSION: 1. Slight decrease in volume of large loculated right pleural effusion status post thoracentesis. 2. No pneumothorax.  Electronically Signed   By: Kerby Moors M.D.   On: 01/10/2021 13:02   DG Chest 1 View  Result Date: 01/09/2021 CLINICAL DATA:  Status post left thoracentesis EXAM: CHEST  1 VIEW COMPARISON:  01/08/2021 FINDINGS: Single frontal view of the chest demonstrates near complete resolution of the left pleural effusion after interval left-sided thoracentesis. No evidence of left-sided pneumothorax. Minimal consolidation at the left lung base consistent with residual atelectasis. There is persistent loculated right pleural effusion consistent with known empyema. Multifocal consolidation throughout the right lung is stable. Cardiac silhouette is unchanged. No acute bony abnormalities. IMPRESSION: 1. No complication after left-sided thoracentesis. Near complete resolution of left pleural effusion. 2. Stable known right empyema and underlying right lung consolidation. Electronically Signed   By: Randa Ngo M.D.   On: 01/09/2021 15:57   DG Chest 2 View  Result Date: 01/08/2021 CLINICAL DATA:  Increased shortness of breath EXAM: CHEST - 2 VIEW COMPARISON:  Chest x-ray dated January 05, 2021 FINDINGS: Visualized cardiac and mediastinal contours are unchanged. Increased size of moderate to large loculated right pleural effusion. Persistent right lung consolidation. Small left pleural effusion. No pneumothorax. IMPRESSION: Increased size of moderate to large loculated right pleural effusion when compared with most recent prior x-ray. Increased size of small left pleural effusion. Electronically Signed   By: Yetta Glassman M.D.   On: 01/08/2021 13:56   DG Chest 2 View  Result Date: 01/02/2021 CLINICAL DATA:  Dyspnea. EXAM: CHEST - 2 VIEW COMPARISON:  November 27, 2020. FINDINGS: Stable cardiomediastinal silhouette. No definite pneumothorax is noted. Stable large probable loculated right pleural effusion is noted extending from the apex to the base along the lateral wall. Atelectasis or infiltrates are noted in the  aerated portions of the right lung. Minimal left basilar subsegmental atelectasis is noted. Bony thorax is unremarkable. IMPRESSION: Stable large loculated right pleural effusion is noted. Atelectasis or infiltrates are noted in the aerated portions of the right lung. Minimal left basilar subsegmental atelectasis. Electronically Signed  By: Marijo Conception M.D.   On: 01/02/2021 16:53   CT CHEST WO CONTRAST  Result Date: 01/08/2021 CLINICAL DATA:  Non-small cell lung cancer. Status post radiotherapy. Recurrent right-sided loculated hydropneumothorax. EXAM: CT CHEST WITHOUT CONTRAST TECHNIQUE: Multidetector CT imaging of the chest was performed following the standard protocol without IV contrast. COMPARISON:  10/02/2020 FINDINGS: Cardiovascular: Moderate cardiac enlargement. No pericardial effusion identified. Aortic atherosclerosis. Coronary artery calcifications. Mediastinum/Nodes: Normal appearance of the thyroid gland. The trachea appears patent and is midline. Normal appearance of the esophagus. No enlarged axillary lymph nodes. Multiple prominent mediastinal lymph nodes are again identified. None of these meet CT criteria for adenopathy. Index nodes include: -left pre-vascular lymph node measures 1.1 cm, image 58/2. Unchanged from previous exam. Right paratracheal lymph node measures 1.1 cm, image 46/2. Previously 0.8 cm. Hilar lymph nodes are suboptimally evaluated due to lack of IV contrast material. Lungs/Pleura: Moderate loculated right pleural effusion is again noted and does not appear significantly improved in volume when compared with 10/02/2020. Decrease gas within this fluid collection noted compared with 10/02/2020. Significantly diminished aeration to the right lung. Overlying areas of pleural thickening and presumed rounded atelectasis noted within the right middle lobe and right lower lobe. Multiple areas of subsegmental atelectasis also noted within the aerated portions of the right lung.  Moderate to large left pleural effusion has increased in volume from the previous exam. Treated tumor within the anteromedial left upper lobe measures 1.7 cm, image 39/5. Previously 1.8 cm. Patchy areas of ground-glass and airspace density within the anterior left upper lobe is nonspecific, but favored to represent sequelae of external beam radiation. New atelectasis of the inferior lingula, image 114/5. Upper Abdomen: No acute findings within the imaged portions of the upper abdomen. Aortic atherosclerosis noted. Musculoskeletal: Spondylosis within the thoracic spine. No acute or significant osseous findings. IMPRESSION: 1. Stable appearance of treated tumor within the anteromedial left upper lobe. Adjacent area of ground-glass and airspace density is favored to represent sequelae of external beam radiation. 2. Large left pleural effusion has significantly increased in volume when compared with 10/02/2020. 3. No significant change in volume of loculated right pleural effusion, previously characterized as empyema. Volume of gas within the right pleural fluid collection has decreased in the interval. 4. As before there is significantly diminished aeration to the right lung. Similar to previous exam is diffuse pleural thickening overlying the loculated right pleural fluid collection with areas of presumed rounded atelectasis as well as segmental and subsegmental atelectasis. 5. Aortic atherosclerosis and coronary artery calcifications. Aortic Atherosclerosis (ICD10-I70.0). Electronically Signed   By: Kerby Moors M.D.   On: 01/08/2021 19:01   DG CHEST PORT 1 VIEW  Result Date: 01/05/2021 CLINICAL DATA:  Right thoracentesis, short of breath EXAM: PORTABLE CHEST 1 VIEW COMPARISON:  01/02/2021, 10/02/2020 FINDINGS: Single frontal view of the chest demonstrates slight decrease in the right pleural effusion seen previously. Underlying right lung consolidation and pleural thickening again noted and unchanged. No  evidence of postprocedural pneumothorax. Fiduciary marker identified overlying the left anterior first rib at the site of the known left upper lobe pulmonary nodule. Trace left pleural effusion versus pleural thickening identified. There are no acute bony abnormalities. Cardiac silhouette is stable. IMPRESSION: 1. Slight decrease in loculated right pleural fluid after thoracentesis. No evidence of postprocedural complication. 2. Persistent right lung consolidation and pleural thickening compatible with previous history of empyema. 3. Fiduciary marker left upper lobe compatible with known pulmonary nodule. Electronically Signed   By: Legrand Como  Owens Shark M.D.   On: 01/05/2021 15:49   ECHOCARDIOGRAM COMPLETE  Result Date: 01/09/2021    ECHOCARDIOGRAM REPORT   Patient Name:   Adam Briggs Date of Exam: 01/09/2021 Medical Rec #:  [id number]     Height:       70.0 in Accession #:    [id number]    Weight:       160.0 lb Date of Birth:  03/31/1930     BSA:          1.898 m Patient Age:    46 years      BP:           118/68 mmHg Patient Gender: M             HR:           82 bpm. Exam Location:  Inpatient Procedure: 2D Echo, Cardiac Doppler and Color Doppler Indications:    Cardiomyopathy-Unspecified I42.9  History:        Patient has prior history of Echocardiogram examinations, most                 recent 09/20/2020. Aortic Valve Disease; Arrythmias:Atrial                 Fibrillation. Non-small cell lung cancer. Right pneumothorax.                 Acute hypoxemic respiratory failure.  Sonographer:    Darlina Sicilian RDCS Referring Phys: [id number] [facility]  Sonographer Comments: Suboptimal parasternal window, suboptimal apical window and suboptimal subcostal window. Image acquisition challenging due to respiratory motion. IMPRESSIONS  1. Largely nondiagnostic study due to poor acoustic windows for imaging. Limited findings as noted below.  2. Left ventricular ejection fraction, by estimation, is 65 to 70%. The left ventricle  has normal function. Left ventricular diastolic parameters are indeterminate.  3. Large pleural effusion.  4. The mitral valve is degenerative.  5. The aortic valve is abnormal. FINDINGS  Left Ventricle: Left ventricular ejection fraction, by estimation, is 65 to 70%. The left ventricle has normal function. Definity contrast agent was given IV to delineate the left ventricular endocardial borders. Left ventricular diastolic parameters are indeterminate. Mitral Valve: The mitral valve is degenerative in appearance. Aortic Valve: The aortic valve is abnormal. Additional Comments: There is a large pleural effusion.  LEFT VENTRICLE PLAX 2D LVOT diam:     1.60 cm LVOT Area:     2.01 cm   AORTA Ao Root diam: 3.00 cm MITRAL VALVE MV Area (PHT): 4.30 cm     SHUNTS MV Decel Time: 176 msec     Systemic Diam: 1.60 cm MV E velocity: 109.33 cm/s Cherlynn Kaiser MD Electronically signed by Cherlynn Kaiser MD Signature Date/Time: 01/09/2021/5:29:15 PM    Final    VAS [place] LOWER EXTREMITY VENOUS (DVT)  Result Date: 01/10/2021  Lower Venous DVT Study Patient Name:  Adam Briggs  Date of Exam:   01/10/2021 Medical Rec #: [id number]      Accession #:    [id number] Date of Birth: 21-Dec-1929      Patient Gender: M Patient Age:   74 years Exam Location:  [facility] Procedure:      VAS [place] LOWER EXTREMITY VENOUS (DVT) Referring Phys: MURALI RAMASWAMY --------------------------------------------------------------------------------  Indications: Edema.  Risk Factors: None identified. Comparison Study: No prior studies. Performing Technologist: Oliver Hum RVT  Examination Guidelines: A complete evaluation includes B-mode imaging, spectral Doppler, color Doppler, and power Doppler as needed  of all accessible portions of each vessel. Bilateral testing is considered an integral part of a complete examination. Limited examinations for reoccurring indications may be performed as noted. The reflux portion of the exam is performed  with the patient in reverse Trendelenburg.  +---------+---------------+---------+-----------+----------+--------------+ RIGHT    CompressibilityPhasicitySpontaneityPropertiesThrombus Aging +---------+---------------+---------+-----------+----------+--------------+ CFV      Full           Yes      Yes                                 +---------+---------------+---------+-----------+----------+--------------+ SFJ      Full                                                        +---------+---------------+---------+-----------+----------+--------------+ FV Prox  Full                                                        +---------+---------------+---------+-----------+----------+--------------+ FV Mid   Full                                                        +---------+---------------+---------+-----------+----------+--------------+ FV DistalFull                                                        +---------+---------------+---------+-----------+----------+--------------+ PFV      Full                                                        +---------+---------------+---------+-----------+----------+--------------+ POP      Full           Yes      Yes                                 +---------+---------------+---------+-----------+----------+--------------+ PTV      Full                                                        +---------+---------------+---------+-----------+----------+--------------+ PERO     Full                                                        +---------+---------------+---------+-----------+----------+--------------+   +---------+---------------+---------+-----------+----------+--------------+ LEFT  CompressibilityPhasicitySpontaneityPropertiesThrombus Aging +---------+---------------+---------+-----------+----------+--------------+ CFV      Full           Yes      Yes                                  +---------+---------------+---------+-----------+----------+--------------+ SFJ      Full                                                        +---------+---------------+---------+-----------+----------+--------------+ FV Prox  Full                                                        +---------+---------------+---------+-----------+----------+--------------+ FV Mid   Full                                                        +---------+---------------+---------+-----------+----------+--------------+ FV DistalFull                                                        +---------+---------------+---------+-----------+----------+--------------+ PFV      Full                                                        +---------+---------------+---------+-----------+----------+--------------+ POP      Full           Yes      Yes                                 +---------+---------------+---------+-----------+----------+--------------+ PTV      Full                                                        +---------+---------------+---------+-----------+----------+--------------+ PERO     Full                                                        +---------+---------------+---------+-----------+----------+--------------+     Summary: RIGHT: - There is no evidence of deep vein thrombosis in the lower extremity.  - No cystic structure found in the popliteal fossa.  LEFT: - There is no evidence of deep vein thrombosis in the lower extremity.  - No cystic structure found in the  popliteal fossa.  *See table(s) above for measurements and observations. Electronically signed by Harold Barban MD on 01/10/2021 at 8:06:44 PM.    Final    US Abdomen Limited RUQ (LIVER/GB)  Result Date: 01/08/2021 CLINICAL DATA:  Right pleural effusion and suspected cirrhosis. EXAM: ULTRASOUND ABDOMEN LIMITED RIGHT UPPER QUADRANT COMPARISON:  None. FINDINGS: Gallbladder: No gallstones or wall  thickening visualized. No sonographic Murphy sign noted by sonographer. Common bile duct: Diameter: 4 mm Liver: There is slight heterogeneity of the liver echogenicity with coarsened echotexture and mild surface irregularity, consistent with changes of cirrhosis. No discrete mass identified. Portal vein is patent on color Doppler imaging with normal direction of blood flow towards the liver. Other: Partially visualized right pleural effusion. IMPRESSION: 1. Cirrhosis. 2. Patent main portal vein with hepatopetal flow. 3. No ascites. 4. Right pleural effusion. Electronically Signed   By: Anner Crete M.D.   On: 01/08/2021 19:51   US THORACENTESIS ASP PLEURAL SPACE W/IMG GUIDE  Result Date: 01/10/2021 INDICATION: Patient with history of lung and prostate cancers, bilateral pleural effusions, cirrhosis by imaging; request received for diagnostic and therapeutic right thoracentesis. EXAM: ULTRASOUND GUIDED DIAGNOSTIC AND THERAPEUTIC RIGHT THORACENTESIS MEDICATIONS: 1% lidocaine to skin and subcutaneous tissue COMPLICATIONS: None immediate. PROCEDURE: An ultrasound guided thoracentesis was thoroughly discussed with the patient/daughter and questions answered. The benefits, risks, alternatives and complications were also discussed. The patient understands and wishes to proceed with the procedure. Written consent was obtained. Ultrasound was performed to localize and mark an adequate pocket of fluid in the right chest. The area was then prepped and draped in the normal sterile fashion. 1% Lidocaine was used for local anesthesia. Under ultrasound guidance a 6 Fr Safe-T-Centesis catheter was introduced. Thoracentesis was performed. The catheter was removed and a dressing applied. FINDINGS: A total of approximately 520 cc of amber/blood-tinged fluid was removed. Samples were sent to the laboratory as requested by the clinical team. Due to patient chest discomfort only the above amount of fluid was removed today.  IMPRESSION: Successful ultrasound guided diagnostic and therapeutic right thoracentesis yielding 520 cc of pleural fluid. Read by: Rowe Robert, PA-C Electronically Signed   By: Markus Daft M.D.   On: 01/10/2021 12:41   US THORACENTESIS ASP PLEURAL SPACE W/IMG GUIDE  Result Date: 01/10/2021 INDICATION: Patient with history of non-small cell lung cancer, left pleural effusion; request for left thoracentesis EXAM: ULTRASOUND GUIDED DIAGNOSTIC AND THERAPEUTIC LEFT THORACENTESIS MEDICATIONS: 5 ML 1% LIDOCAINE COMPLICATIONS: None immediate. PROCEDURE: An ultrasound guided thoracentesis was thoroughly discussed with the patient and questions answered. The benefits, risks, alternatives and complications were also discussed. The patient understands and wishes to proceed with the procedure. Written consent was obtained. Ultrasound was performed to localize and mark an adequate pocket of fluid in the left chest. The area was then prepped and draped in the normal sterile fashion. 1% Lidocaine was used for local anesthesia. Under ultrasound guidance a 6 Fr Safe-T-Centesis catheter was introduced. Thoracentesis was performed. The catheter was removed and a dressing applied. FINDINGS: A total of approximately 1.4 L of blood-tinged, amber fluid was removed. Samples were sent to the laboratory as requested by the clinical team. IMPRESSION: Successful ultrasound guided diagnostic and therapeutic left thoracentesis yielding 1.4 L of pleural fluid. Read by: Narda Rutherford, NP Electronically Signed   By: Lucrezia Europe M.D.   On: 01/09/2021 15:40    Lab Data:  CBC: Recent Labs  Lab 01/08/21 1256 01/10/21 1335 01/11/21 0448 01/12/21 0521 01/13/21 0620  WBC 4.9  6.6 6.0 5.6 5.1  NEUTROABS 3.9  --   --   --   --   HGB 12.1* 11.4* 11.7* 11.7* 10.9*  HCT 39.5 38.4* 39.8 39.9 37.8*  MCV 82.6 85.7 85.6 86.4 86.9  PLT 199 211 214 187 481   Basic Metabolic Panel: Recent Labs  Lab 01/08/21 1256 01/09/21 0452 01/12/21 0521  01/13/21 0620  NA 140 137 138 140  K 3.7 4.0 4.8 4.4  CL 99 96* 95* 94*  CO2 30 33* 37* 43*  GLUCOSE 152* 97 120* 131*  BUN 27* 27* 32* 30*  CREATININE 0.91 0.98 0.83 0.80  CALCIUM 8.8* 8.5* 8.8* 8.7*   GFR: Estimated Creatinine Clearance: 64.3 mL/min (by C-G formula based on SCr of 0.8 mg/dL). Liver Function Tests: Recent Labs  Lab 01/08/21 1256  AST 15  ALT 16  ALKPHOS 57  BILITOT 0.8  PROT 6.7  ALBUMIN 3.4*   No results for input(s): LIPASE, AMYLASE in the last 168 hours. No results for input(s): AMMONIA in the last 168 hours. Coagulation Profile: Recent Labs  Lab 01/12/21 0521  INR 1.1   Cardiac Enzymes: No results for input(s): CKTOTAL, CKMB, CKMBINDEX, TROPONINI in the last 168 hours. BNP (last 3 results) Recent Labs    09/15/20 1132  PROBNP 238.0*   HbA1C: No results for input(s): HGBA1C in the last 72 hours. CBG: No results for input(s): GLUCAP in the last 168 hours. Lipid Profile: No results for input(s): CHOL, HDL, LDLCALC, TRIG, CHOLHDL, LDLDIRECT in the last 72 hours. Thyroid Function Tests: No results for input(s): TSH, T4TOTAL, FREET4, T3FREE, THYROIDAB in the last 72 hours. Anemia Panel: No results for input(s): VITAMINB12, FOLATE, FERRITIN, TIBC, IRON, RETICCTPCT in the last 72 hours. Urine analysis:    Component Value Date/Time   COLORURINE YELLOW 01/09/2021 1125   APPEARANCEUR CLEAR 01/09/2021 1125   LABSPEC 1.010 01/09/2021 1125   PHURINE 5.0 01/09/2021 1125   GLUCOSEU NEGATIVE 01/09/2021 1125   GLUCOSEU NEGATIVE 09/23/2011 1032   HGBUR NEGATIVE 01/09/2021 1125   HGBUR negative 09/26/2008 0908   BILIRUBINUR NEGATIVE 01/09/2021 1125   KETONESUR NEGATIVE 01/09/2021 1125   PROTEINUR 30 (A) 01/09/2021 1125   UROBILINOGEN 1.0 01/16/2012 1008   NITRITE NEGATIVE 01/09/2021 1125   LEUKOCYTESUR NEGATIVE 01/09/2021 1125     Geoff Dacanay M.D. [facility] 01/13/2021, 1:11 PM  Available via Epic secure chat 7am-7pm After 7 pm,  please refer to night coverage provider listed on amion.

## 2021-01-13 NOTE — Progress Notes (Signed)
1216 pt back in green MEWS, cont to monitor. Informed charge.

## 2021-01-13 NOTE — Progress Notes (Signed)
1016 Pt was yello MEWS. Dr. Tana Coast was on floor at bedside, no concern due to baseline being 100-110 and hadn't received heart meds yet.  Informed Charge Abigail Butts, RN as well advise to continue to monitor for changes. Admin 1000 meds.

## 2021-01-14 ENCOUNTER — Inpatient Hospital Stay (HOSPITAL_COMMUNITY): Payer: Medicare Other

## 2021-01-14 DIAGNOSIS — R0600 Dyspnea, unspecified: Secondary | ICD-10-CM | POA: Diagnosis not present

## 2021-01-14 DIAGNOSIS — J9383 Other pneumothorax: Secondary | ICD-10-CM | POA: Diagnosis not present

## 2021-01-14 DIAGNOSIS — J9601 Acute respiratory failure with hypoxia: Secondary | ICD-10-CM | POA: Diagnosis not present

## 2021-01-14 DIAGNOSIS — L8990 Pressure ulcer of unspecified site, unspecified stage: Secondary | ICD-10-CM | POA: Insufficient documentation

## 2021-01-14 DIAGNOSIS — I480 Paroxysmal atrial fibrillation: Secondary | ICD-10-CM | POA: Diagnosis not present

## 2021-01-14 LAB — ACID FAST SMEAR (AFB, MYCOBACTERIA): Acid Fast Smear: NEGATIVE

## 2021-01-14 LAB — QUANTIFERON-TB GOLD PLUS: QuantiFERON-TB Gold Plus: UNDETERMINED — AB

## 2021-01-14 LAB — QUANTIFERON-TB GOLD PLUS (RQFGPL)
QuantiFERON Mitogen Value: 0.48 IU/mL
QuantiFERON Nil Value: 0 IU/mL
QuantiFERON TB1 Ag Value: 0.01 IU/mL
QuantiFERON TB2 Ag Value: 0 IU/mL

## 2021-01-14 LAB — BRAIN NATRIURETIC PEPTIDE: B Natriuretic Peptide: 357.5 pg/mL — ABNORMAL HIGH (ref 0.0–100.0)

## 2021-01-14 MED ORDER — BUDESONIDE 0.25 MG/2ML IN SUSP
0.2500 mg | Freq: Two times a day (BID) | RESPIRATORY_TRACT | Status: DC
  Administered 2021-01-14 – 2021-01-16 (×5): 0.25 mg via RESPIRATORY_TRACT
  Filled 2021-01-14 (×4): qty 2

## 2021-01-14 MED ORDER — PREDNISONE 20 MG PO TABS
40.0000 mg | ORAL_TABLET | Freq: Every day | ORAL | Status: AC
  Administered 2021-01-14 – 2021-01-16 (×3): 40 mg via ORAL
  Filled 2021-01-14 (×3): qty 2

## 2021-01-14 NOTE — Progress Notes (Signed)
[facility]                                                                              Patient Demographics  Adam Briggs, is a 87 y.o. male, DOB - 20-Apr-1930, CHY:[id number]  Admit date - 01/08/2021   Admitting Physician Samuella Cota, MD  Outpatient Primary MD for the patient is [name], MD  Outpatient specialists:   LOS - 5  days   Medical records reviewed and are as summarized below:    Chief Complaint  Patient presents with   Shortness of Breath       Brief summary   87 year old male with a history of NSCLC left upper lobe, completed XRT, recurrent right-sided loculated hydropneumothorax, underwent outpatient left thoracentesis for rec pleural effusion per PCCM on 8/19 with removal of 500 cc.  Patient was admitted for acute hypoxic respiratory failure secondary to recurrent increasing loculated pleural effusion.    Assessment & Plan    New worsening left pleural effusion, acute hypoxic respiratory failure loculated right lateral pleural effusion, recurrent -Seen on CT 8/22, underwent left thoracentesis on 8/23 (also underwent thoracentesis right on 8/24) -Removed 1.4 L of blood-tinged fluid from left thoracentesis -- Unclear etiology for the recurrent pleural effusions, transudative (patient has NSCLC malignant neoplasm of the LUL lung, completed XRT 11/23/2020, radonc Dr. Lisbeth Renshaw, primary pulmonologist, Dr. [name]) - Cytology 8/23 showed reactive mesothelial cells, numerous lymphoid cells -Appreciate cardiology and GI opinion.  Per cardiology, pleural effusions not from cardiac source.  Discussed in detail with GI, Dr. Rush Landmark, does not appear cirrhosis is the cause for bilateral pleural effusion, no ascites, no portal hypertension LFTs normal, INR normal, no thrombocytopenia.  There could be concern for idiopathic cryptogenic cirrhosis however rare to have only pleural effusion without any underlying liver failure. Recommended trial  of increasing diuretics.  Lasix increased to 20 mg IV twice daily.   -Patient's daughter had requested transfer to tertiary care, was declined -Wheezing bilaterally today, will obtain chest x-ray, BNP - was placed on scheduled duo nebs 3 times daily on 8/27, however no improvement  -Placed on inhaled steroids, prednisone 40 mg daily, incentive spirometry  -Continue Lasix 20 mg IV twice daily, if elevated BNP, will give extra dose today.  - Monitor I's and O's, negative balance of 553 cc, lower extremity edema improving    Loculated right lateral pleural effusion, recurrent -Since April/May 2022, nondiagnostic cytology in 08/2020 and 01/05/2021, loculated and persistent -Underwent thoracentesis right lung on 8/24, 520 cc removed -Pulmonology following, appreciate recommendations -See above regarding cardiology and GI opinion  Cirrhosis, noted on RUQ [place]: New diagnosis -2D echo showed EF of 65 to 70%, aortic stenosis.  Previously has been recommended TEE, doubt able to do TEE with his respiratory status -24-hour urine protein normal -Ultrasound showed no ascites, patent main portal vein, no portal hypertension, ?  Passive -see #1, appreciate GI recommendations. -Upon discharge, will place on Lasix 20 mg daily, spironolactone 50 mg daily and titrate up with BP and as tolerated -Continue IV Lasix 20 mg twice daily  Possible aortic stenosis on echo 01/09/2021 -Previously has been recommended TEE -  Patient followed by Dr. Percival Spanish, office note on 11/03/2020 had noted moderate aortic stenosis and repeat echo in 1 year.  No acute concerns were noted. -Per cardiology, no acute issues and bilateral pleural effusions are likely from cardiac source   Persistent atrial fibrillation -Heart rate controlled, continue metoprolol 25 twice daily -Eliquis resumed  BPH -Continue tamsulosin  History of NSCLC -Outpatient follow-up with pulmonology, radiation oncology, oncology  Essential  hypertension -Continue beta-blocker, Lasix  Hyperlipidemia -Continue statin  Estimated body mass index is 22.96 kg/m as calculated from the following:   Height as of this encounter: 5\' 10"  (1.778 m).   Weight as of this encounter: 72.6 kg.  Code Status: Full code DVT Prophylaxis:  SCDs Start: 01/08/21 1749 apixaban (ELIQUIS) tablet 5 mg   Level of Care: Level of care: Med-Surg Family Communication: Discussed all imaging results, lab results, explained to the patient and daughter at the bedside.      Disposition Plan:     Status is: Inpatient  Remains inpatient appropriate because:Inpatient level of care appropriate due to severity of illness  Dispo: The patient is from: Home              Anticipated d/c is to: Home              Patient currently is not medically stable to d/c.  Hopefully DC home in 24 to 48 hours, and outpatient follow-up with [facility] pulmonology   Difficult to place patient No   Time Spent in minutes 25 minutes  Procedures:  Thoracentesis left, 1.4 L removed on 8/23 Ultrasound thoracentesis, right 8/24 with 520 cc removed  Consultants:   Pulmonology Cardiology GI  Antimicrobials:   Anti-infectives (From admission, onward)    None          Medications  Scheduled Meds:  apixaban  5 mg Oral BID   furosemide  20 mg Intravenous BID   ipratropium-albuterol  3 mL Nebulization TID   metoprolol tartrate  25 mg Oral BID   polyethylene glycol  17 g Oral BID   predniSONE  40 mg Oral Q breakfast   senna-docusate  1 tablet Oral QHS   simvastatin  20 mg Oral q1800   sodium chloride flush  3 mL Intravenous Q12H   tamsulosin  0.4 mg Oral q AM   Continuous Infusions:  sodium chloride     PRN Meds:.sodium chloride, acetaminophen **OR** acetaminophen, albuterol, alum & mag hydroxide-simeth, fluticasone, levalbuterol, ondansetron **OR** ondansetron (ZOFRAN) IV, sodium chloride flush      Subjective:   [name] was seen and examined today.  No  overnight issues however diffusely wheezing today (audible).  No worsening shortness of breath, chest pain, dizziness or lightheadedness.  Worked with PT yesterday.    Objective:   Vitals:   01/13/21 2151 01/14/21 0430 01/14/21 0838 01/14/21 0948  BP: 118/62 126/69  121/64  Pulse: 88 89  (!) 103  Resp: (!) 24 14    Temp: 97.9 F (36.6 C) 97.8 F (36.6 C)  98.3 F (36.8 C)  TempSrc: Oral Oral  Oral  SpO2: 99% 99% 95% 99%  Weight:      Height:        Intake/Output Summary (Last 24 hours) at 01/14/2021 1254 Last data filed at 01/14/2021 0900 Gross per 24 hour  Intake 720 ml  Output 1001 ml  Net -281 ml     Wt Readings from Last 3 Encounters:  01/08/21 72.6 kg  11/27/20 74.1 kg  11/03/20 73.8 kg  Physical Exam General: Alert and oriented x 3, NAD Cardiovascular: Irregular Respiratory: Bilateral diffuse wheezing Gastrointestinal: Soft, nontender, nondistended, NBS Ext: trace pedal edema bilaterally, improving Neuro: no new deficits Skin: No rashes Psych: Normal affect and demeanor, alert and oriented x3      Data Reviewed:  I have personally reviewed following labs and imaging studies  Micro Results Recent Results (from the past 240 hour(s))  Resp Panel by RT-PCR (Flu A&B, Covid) Nasopharyngeal Swab     Status: None   Collection Time: 01/08/21  1:02 PM   Specimen: Nasopharyngeal Swab; Nasopharyngeal(NP) swabs in vial transport medium  Result Value Ref Range Status   SARS Coronavirus 2 by RT PCR NEGATIVE NEGATIVE Final    Comment: (NOTE) SARS-CoV-2 target nucleic acids are NOT DETECTED.  The SARS-CoV-2 RNA is generally detectable in upper respiratory specimens during the acute phase of infection. The lowest concentration of SARS-CoV-2 viral copies this assay can detect is 138 copies/mL. A negative result does not preclude SARS-Cov-2 infection and should not be used as the sole basis for treatment or other patient management decisions. A negative result may  occur with  improper specimen collection/handling, submission of specimen other than nasopharyngeal swab, presence of viral mutation(s) within the areas targeted by this assay, and inadequate number of viral copies(<138 copies/mL). A negative result must be combined with clinical observations, patient history, and epidemiological information. The expected result is Negative.  Fact Sheet for Patients:  EntrepreneurPulse.com.au  Fact Sheet for Healthcare Providers:  IncredibleEmployment.be  This test is no t yet approved or cleared by the [place] FDA and  has been authorized for detection and/or diagnosis of SARS-CoV-2 by FDA under an Emergency Use Authorization (EUA). This EUA will remain  in effect (meaning this test can be used) for the duration of the COVID-19 declaration under Section 564(b)(1) of the Act, 21 U.S.C.section 360bbb-3(b)(1), unless the authorization is terminated  or revoked sooner.       Influenza A by PCR NEGATIVE NEGATIVE Final   Influenza B by PCR NEGATIVE NEGATIVE Final    Comment: (NOTE) The Xpert Xpress SARS-CoV-2/FLU/RSV plus assay is intended as an aid in the diagnosis of influenza from Nasopharyngeal swab specimens and should not be used as a sole basis for treatment. Nasal washings and aspirates are unacceptable for Xpert Xpress SARS-CoV-2/FLU/RSV testing.  Fact Sheet for Patients: EntrepreneurPulse.com.au  Fact Sheet for Healthcare Providers: IncredibleEmployment.be  This test is not yet approved or cleared by the [place] FDA and has been authorized for detection and/or diagnosis of SARS-CoV-2 by FDA under an Emergency Use Authorization (EUA). This EUA will remain in effect (meaning this test can be used) for the duration of the COVID-19 declaration under Section 564(b)(1) of the Act, 21 U.S.C. section 360bbb-3(b)(1), unless the authorization is terminated  or revoked.  Performed at [facility], [place] [address] [place], [place] [address]   Body fluid culture w Gram Stain     Status: None   Collection Time: 01/09/21  3:43 PM   Specimen: PATH Cytology Pleural fluid  Result Value Ref Range Status   Specimen Description   Final    PLEURAL Performed at [place] [address] [place], [place] [address]    Special Requests   Final    NONE Performed at [facility], [place] [address], [place], [place] [address]    Gram Stain   Final    RARE WBC PRESENT,BOTH PMN AND MONONUCLEAR NO ORGANISMS SEEN  Culture   Final    NO GROWTH 3 DAYS Performed at [facility], [place] [address], [place], [place] [address]    Report Status 01/13/2021 FINAL  Final  Culture, body fluid w Gram Stain-bottle     Status: None (Preliminary result)   Collection Time: 01/10/21 12:21 PM   Specimen: Fluid  Result Value Ref Range Status   Specimen Description FLUID PLEURAL  Final   Special Requests BOTTLES DRAWN AEROBIC AND ANAEROBIC  Final   Culture   Final    NO GROWTH 3 DAYS Performed at [facility], [place] [address] [place], [place] [address]    Report Status PENDING  Incomplete  Gram stain     Status: None   Collection Time: 01/10/21 12:21 PM   Specimen: Fluid  Result Value Ref Range Status   Specimen Description FLUID PLEURAL  Final   Special Requests NONE  Final   Gram Stain   Final    FEW SQUAMOUS EPITHELIAL CELLS PRESENT MODERATE WBC PRESENT, PREDOMINANTLY MONONUCLEAR NO ORGANISMS SEEN Performed at [facility], [place] [address], [place], [place] [address]    Report Status 01/11/2021 FINAL  Final    Radiology Reports DG Chest 1 View  Result Date: 01/10/2021 CLINICAL DATA:  Status post right thoracentesis. EXAM: CHEST  1 VIEW COMPARISON:  01/09/2021 FINDINGS: Large loculated right pleural effusion is again identified. There has been slight decrease in volume of the right  pleural effusion with mildly improved aeration to the right mid lung. No definite pneumothorax identified. Small left pleural effusion appears unchanged. IMPRESSION: 1. Slight decrease in volume of large loculated right pleural effusion status post thoracentesis. 2. No pneumothorax. Electronically Signed   By: Kerby Moors M.D.   On: 01/10/2021 13:02   DG Chest 1 View  Result Date: 01/09/2021 CLINICAL DATA:  Status post left thoracentesis EXAM: CHEST  1 VIEW COMPARISON:  01/08/2021 FINDINGS: Single frontal view of the chest demonstrates near complete resolution of the left pleural effusion after interval left-sided thoracentesis. No evidence of left-sided pneumothorax. Minimal consolidation at the left lung base consistent with residual atelectasis. There is persistent loculated right pleural effusion consistent with known empyema. Multifocal consolidation throughout the right lung is stable. Cardiac silhouette is unchanged. No acute bony abnormalities. IMPRESSION: 1. No complication after left-sided thoracentesis. Near complete resolution of left pleural effusion. 2. Stable known right empyema and underlying right lung consolidation. Electronically Signed   By: Randa Ngo M.D.   On: 01/09/2021 15:57   DG Chest 2 View  Result Date: 01/08/2021 CLINICAL DATA:  Increased shortness of breath EXAM: CHEST - 2 VIEW COMPARISON:  Chest x-ray dated January 05, 2021 FINDINGS: Visualized cardiac and mediastinal contours are unchanged. Increased size of moderate to large loculated right pleural effusion. Persistent right lung consolidation. Small left pleural effusion. No pneumothorax. IMPRESSION: Increased size of moderate to large loculated right pleural effusion when compared with most recent prior x-ray. Increased size of small left pleural effusion. Electronically Signed   By: Yetta Glassman M.D.   On: 01/08/2021 13:56   DG Chest 2 View  Result Date: 01/02/2021 CLINICAL DATA:  Dyspnea. EXAM: CHEST - 2  VIEW COMPARISON:  November 27, 2020. FINDINGS: Stable cardiomediastinal silhouette. No definite pneumothorax is noted. Stable large probable loculated right pleural effusion is noted extending from the apex to the base along the lateral wall. Atelectasis or infiltrates are noted in the aerated portions of the right lung. Minimal left basilar subsegmental atelectasis  is noted. Bony thorax is unremarkable. IMPRESSION: Stable large loculated right pleural effusion is noted. Atelectasis or infiltrates are noted in the aerated portions of the right lung. Minimal left basilar subsegmental atelectasis. Electronically Signed   By: Marijo Conception M.D.   On: 01/02/2021 16:53   CT CHEST WO CONTRAST  Result Date: 01/08/2021 CLINICAL DATA:  Non-small cell lung cancer. Status post radiotherapy. Recurrent right-sided loculated hydropneumothorax. EXAM: CT CHEST WITHOUT CONTRAST TECHNIQUE: Multidetector CT imaging of the chest was performed following the standard protocol without IV contrast. COMPARISON:  10/02/2020 FINDINGS: Cardiovascular: Moderate cardiac enlargement. No pericardial effusion identified. Aortic atherosclerosis. Coronary artery calcifications. Mediastinum/Nodes: Normal appearance of the thyroid gland. The trachea appears patent and is midline. Normal appearance of the esophagus. No enlarged axillary lymph nodes. Multiple prominent mediastinal lymph nodes are again identified. None of these meet CT criteria for adenopathy. Index nodes include: -left pre-vascular lymph node measures 1.1 cm, image 58/2. Unchanged from previous exam. Right paratracheal lymph node measures 1.1 cm, image 46/2. Previously 0.8 cm. Hilar lymph nodes are suboptimally evaluated due to lack of IV contrast material. Lungs/Pleura: Moderate loculated right pleural effusion is again noted and does not appear significantly improved in volume when compared with 10/02/2020. Decrease gas within this fluid collection noted compared with 10/02/2020.  Significantly diminished aeration to the right lung. Overlying areas of pleural thickening and presumed rounded atelectasis noted within the right middle lobe and right lower lobe. Multiple areas of subsegmental atelectasis also noted within the aerated portions of the right lung. Moderate to large left pleural effusion has increased in volume from the previous exam. Treated tumor within the anteromedial left upper lobe measures 1.7 cm, image 39/5. Previously 1.8 cm. Patchy areas of ground-glass and airspace density within the anterior left upper lobe is nonspecific, but favored to represent sequelae of external beam radiation. New atelectasis of the inferior lingula, image 114/5. Upper Abdomen: No acute findings within the imaged portions of the upper abdomen. Aortic atherosclerosis noted. Musculoskeletal: Spondylosis within the thoracic spine. No acute or significant osseous findings. IMPRESSION: 1. Stable appearance of treated tumor within the anteromedial left upper lobe. Adjacent area of ground-glass and airspace density is favored to represent sequelae of external beam radiation. 2. Large left pleural effusion has significantly increased in volume when compared with 10/02/2020. 3. No significant change in volume of loculated right pleural effusion, previously characterized as empyema. Volume of gas within the right pleural fluid collection has decreased in the interval. 4. As before there is significantly diminished aeration to the right lung. Similar to previous exam is diffuse pleural thickening overlying the loculated right pleural fluid collection with areas of presumed rounded atelectasis as well as segmental and subsegmental atelectasis. 5. Aortic atherosclerosis and coronary artery calcifications. Aortic Atherosclerosis (ICD10-I70.0). Electronically Signed   By: Kerby Moors M.D.   On: 01/08/2021 19:01   DG CHEST PORT 1 VIEW  Result Date: 01/05/2021 CLINICAL DATA:  Right thoracentesis, short of  breath EXAM: PORTABLE CHEST 1 VIEW COMPARISON:  01/02/2021, 10/02/2020 FINDINGS: Single frontal view of the chest demonstrates slight decrease in the right pleural effusion seen previously. Underlying right lung consolidation and pleural thickening again noted and unchanged. No evidence of postprocedural pneumothorax. Fiduciary marker identified overlying the left anterior first rib at the site of the known left upper lobe pulmonary nodule. Trace left pleural effusion versus pleural thickening identified. There are no acute bony abnormalities. Cardiac silhouette is stable. IMPRESSION: 1. Slight decrease in loculated right pleural fluid after  thoracentesis. No evidence of postprocedural complication. 2. Persistent right lung consolidation and pleural thickening compatible with previous history of empyema. 3. Fiduciary marker left upper lobe compatible with known pulmonary nodule. Electronically Signed   By: Randa Ngo M.D.   On: 01/05/2021 15:49   ECHOCARDIOGRAM COMPLETE  Result Date: 01/09/2021    ECHOCARDIOGRAM REPORT   Patient Name:   CORMICK MOSS Date of Exam: 01/09/2021 Medical Rec #:  [id number]     Height:       70.0 in Accession #:    [id number]    Weight:       160.0 lb Date of Birth:  Jul 23, 1929     BSA:          1.898 m Patient Age:    61 years      BP:           118/68 mmHg Patient Gender: M             HR:           82 bpm. Exam Location:  Inpatient Procedure: 2D Echo, Cardiac Doppler and Color Doppler Indications:    Cardiomyopathy-Unspecified I42.9  History:        Patient has prior history of Echocardiogram examinations, most                 recent 09/20/2020. Aortic Valve Disease; Arrythmias:Atrial                 Fibrillation. Non-small cell lung cancer. Right pneumothorax.                 Acute hypoxemic respiratory failure.  Sonographer:    Darlina Sicilian RDCS Referring Phys: [id number] [facility]  Sonographer Comments: Suboptimal parasternal window, suboptimal apical window and suboptimal  subcostal window. Image acquisition challenging due to respiratory motion. IMPRESSIONS  1. Largely nondiagnostic study due to poor acoustic windows for imaging. Limited findings as noted below.  2. Left ventricular ejection fraction, by estimation, is 65 to 70%. The left ventricle has normal function. Left ventricular diastolic parameters are indeterminate.  3. Large pleural effusion.  4. The mitral valve is degenerative.  5. The aortic valve is abnormal. FINDINGS  Left Ventricle: Left ventricular ejection fraction, by estimation, is 65 to 70%. The left ventricle has normal function. Definity contrast agent was given IV to delineate the left ventricular endocardial borders. Left ventricular diastolic parameters are indeterminate. Mitral Valve: The mitral valve is degenerative in appearance. Aortic Valve: The aortic valve is abnormal. Additional Comments: There is a large pleural effusion.  LEFT VENTRICLE PLAX 2D LVOT diam:     1.60 cm LVOT Area:     2.01 cm   AORTA Ao Root diam: 3.00 cm MITRAL VALVE MV Area (PHT): 4.30 cm     SHUNTS MV Decel Time: 176 msec     Systemic Diam: 1.60 cm MV E velocity: 109.33 cm/s Cherlynn Kaiser MD Electronically signed by Cherlynn Kaiser MD Signature Date/Time: 01/09/2021/5:29:15 PM    Final    VAS [place] LOWER EXTREMITY VENOUS (DVT)  Result Date: 01/10/2021  Lower Venous DVT Study Patient Name:  ALARIK RADU  Date of Exam:   01/10/2021 Medical Rec #: [id number]      Accession #:    [id number] Date of Birth: 24-Mar-1930      Patient Gender: M Patient Age:   82 years Exam [facility]  [facility] Procedure:      VAS [place] LOWER EXTREMITY VENOUS (DVT) Referring Phys: Belva Crome  RAMASWAMY --------------------------------------------------------------------------------  Indications: Edema.  Risk Factors: None identified. Comparison Study: No prior studies. Performing Technologist: Oliver Hum RVT  Examination Guidelines: A complete evaluation includes B-mode imaging, spectral Doppler,  color Doppler, and power Doppler as needed of all accessible portions of each vessel. Bilateral testing is considered an integral part of a complete examination. Limited examinations for reoccurring indications may be performed as noted. The reflux portion of the exam is performed with the patient in reverse Trendelenburg.  +---------+---------------+---------+-----------+----------+--------------+ RIGHT    CompressibilityPhasicitySpontaneityPropertiesThrombus Aging +---------+---------------+---------+-----------+----------+--------------+ CFV      Full           Yes      Yes                                 +---------+---------------+---------+-----------+----------+--------------+ SFJ      Full                                                        +---------+---------------+---------+-----------+----------+--------------+ FV Prox  Full                                                        +---------+---------------+---------+-----------+----------+--------------+ FV Mid   Full                                                        +---------+---------------+---------+-----------+----------+--------------+ FV DistalFull                                                        +---------+---------------+---------+-----------+----------+--------------+ PFV      Full                                                        +---------+---------------+---------+-----------+----------+--------------+ POP      Full           Yes      Yes                                 +---------+---------------+---------+-----------+----------+--------------+ PTV      Full                                                        +---------+---------------+---------+-----------+----------+--------------+ PERO     Full                                                        +---------+---------------+---------+-----------+----------+--------------+    +---------+---------------+---------+-----------+----------+--------------+  LEFT     CompressibilityPhasicitySpontaneityPropertiesThrombus Aging +---------+---------------+---------+-----------+----------+--------------+ CFV      Full           Yes      Yes                                 +---------+---------------+---------+-----------+----------+--------------+ SFJ      Full                                                        +---------+---------------+---------+-----------+----------+--------------+ FV Prox  Full                                                        +---------+---------------+---------+-----------+----------+--------------+ FV Mid   Full                                                        +---------+---------------+---------+-----------+----------+--------------+ FV DistalFull                                                        +---------+---------------+---------+-----------+----------+--------------+ PFV      Full                                                        +---------+---------------+---------+-----------+----------+--------------+ POP      Full           Yes      Yes                                 +---------+---------------+---------+-----------+----------+--------------+ PTV      Full                                                        +---------+---------------+---------+-----------+----------+--------------+ PERO     Full                                                        +---------+---------------+---------+-----------+----------+--------------+     Summary: RIGHT: - There is no evidence of deep vein thrombosis in the lower extremity.  - No cystic structure found in the popliteal fossa.  LEFT: - There is no evidence of deep vein thrombosis in the lower extremity.  - No cystic  structure found in the popliteal fossa.  *See table(s) above for measurements and observations. Electronically signed  by Harold Barban MD on 01/10/2021 at 8:06:44 PM.    Final    US Abdomen Limited RUQ (LIVER/GB)  Result Date: 01/08/2021 CLINICAL DATA:  Right pleural effusion and suspected cirrhosis. EXAM: ULTRASOUND ABDOMEN LIMITED RIGHT UPPER QUADRANT COMPARISON:  None. FINDINGS: Gallbladder: No gallstones or wall thickening visualized. No sonographic Murphy sign noted by sonographer. Common bile duct: Diameter: 4 mm Liver: There is slight heterogeneity of the liver echogenicity with coarsened echotexture and mild surface irregularity, consistent with changes of cirrhosis. No discrete mass identified. Portal vein is patent on color Doppler imaging with normal direction of blood flow towards the liver. Other: Partially visualized right pleural effusion. IMPRESSION: 1. Cirrhosis. 2. Patent main portal vein with hepatopetal flow. 3. No ascites. 4. Right pleural effusion. Electronically Signed   By: Anner Crete M.D.   On: 01/08/2021 19:51   US THORACENTESIS ASP PLEURAL SPACE W/IMG GUIDE  Result Date: 01/10/2021 INDICATION: Patient with history of lung and prostate cancers, bilateral pleural effusions, cirrhosis by imaging; request received for diagnostic and therapeutic right thoracentesis. EXAM: ULTRASOUND GUIDED DIAGNOSTIC AND THERAPEUTIC RIGHT THORACENTESIS MEDICATIONS: 1% lidocaine to skin and subcutaneous tissue COMPLICATIONS: None immediate. PROCEDURE: An ultrasound guided thoracentesis was thoroughly discussed with the patient/daughter and questions answered. The benefits, risks, alternatives and complications were also discussed. The patient understands and wishes to proceed with the procedure. Written consent was obtained. Ultrasound was performed to localize and mark an adequate pocket of fluid in the right chest. The area was then prepped and draped in the normal sterile fashion. 1% Lidocaine was used for local anesthesia. Under ultrasound guidance a 6 Fr Safe-T-Centesis catheter was introduced. Thoracentesis  was performed. The catheter was removed and a dressing applied. FINDINGS: A total of approximately 520 cc of amber/blood-tinged fluid was removed. Samples were sent to the laboratory as requested by the clinical team. Due to patient chest discomfort only the above amount of fluid was removed today. IMPRESSION: Successful ultrasound guided diagnostic and therapeutic right thoracentesis yielding 520 cc of pleural fluid. Read by: Rowe Robert, PA-C Electronically Signed   By: Markus Daft M.D.   On: 01/10/2021 12:41   US THORACENTESIS ASP PLEURAL SPACE W/IMG GUIDE  Result Date: 01/10/2021 INDICATION: Patient with history of non-small cell lung cancer, left pleural effusion; request for left thoracentesis EXAM: ULTRASOUND GUIDED DIAGNOSTIC AND THERAPEUTIC LEFT THORACENTESIS MEDICATIONS: 5 ML 1% LIDOCAINE COMPLICATIONS: None immediate. PROCEDURE: An ultrasound guided thoracentesis was thoroughly discussed with the patient and questions answered. The benefits, risks, alternatives and complications were also discussed. The patient understands and wishes to proceed with the procedure. Written consent was obtained. Ultrasound was performed to localize and mark an adequate pocket of fluid in the left chest. The area was then prepped and draped in the normal sterile fashion. 1% Lidocaine was used for local anesthesia. Under ultrasound guidance a 6 Fr Safe-T-Centesis catheter was introduced. Thoracentesis was performed. The catheter was removed and a dressing applied. FINDINGS: A total of approximately 1.4 L of blood-tinged, amber fluid was removed. Samples were sent to the laboratory as requested by the clinical team. IMPRESSION: Successful ultrasound guided diagnostic and therapeutic left thoracentesis yielding 1.4 L of pleural fluid. Read by: Narda Rutherford, NP Electronically Signed   By: Lucrezia Europe M.D.   On: 01/09/2021 15:40    Lab Data:  CBC: Recent Labs  Lab 01/08/21 1256 01/10/21 1335 01/11/21 0448  01/12/21 2355  01/13/21 0620  WBC 4.9 6.6 6.0 5.6 5.1  NEUTROABS 3.9  --   --   --   --   HGB 12.1* 11.4* 11.7* 11.7* 10.9*  HCT 39.5 38.4* 39.8 39.9 37.8*  MCV 82.6 85.7 85.6 86.4 86.9  PLT 199 211 214 187 244   Basic Metabolic Panel: Recent Labs  Lab 01/08/21 1256 01/09/21 0452 01/12/21 0521 01/13/21 0620  NA 140 137 138 140  K 3.7 4.0 4.8 4.4  CL 99 96* 95* 94*  CO2 30 33* 37* 43*  GLUCOSE 152* 97 120* 131*  BUN 27* 27* 32* 30*  CREATININE 0.91 0.98 0.83 0.80  CALCIUM 8.8* 8.5* 8.8* 8.7*   GFR: Estimated Creatinine Clearance: 64.3 mL/min (by C-G formula based on SCr of 0.8 mg/dL). Liver Function Tests: Recent Labs  Lab 01/08/21 1256  AST 15  ALT 16  ALKPHOS 57  BILITOT 0.8  PROT 6.7  ALBUMIN 3.4*   No results for input(s): LIPASE, AMYLASE in the last 168 hours. No results for input(s): AMMONIA in the last 168 hours. Coagulation Profile: Recent Labs  Lab 01/12/21 0521  INR 1.1   Cardiac Enzymes: No results for input(s): CKTOTAL, CKMB, CKMBINDEX, TROPONINI in the last 168 hours. BNP (last 3 results) Recent Labs    09/15/20 1132  PROBNP 238.0*   HbA1C: No results for input(s): HGBA1C in the last 72 hours. CBG: No results for input(s): GLUCAP in the last 168 hours. Lipid Profile: No results for input(s): CHOL, HDL, LDLCALC, TRIG, CHOLHDL, LDLDIRECT in the last 72 hours. Thyroid Function Tests: No results for input(s): TSH, T4TOTAL, FREET4, T3FREE, THYROIDAB in the last 72 hours. Anemia Panel: No results for input(s): VITAMINB12, FOLATE, FERRITIN, TIBC, IRON, RETICCTPCT in the last 72 hours. Urine analysis:    Component Value Date/Time   COLORURINE YELLOW 01/09/2021 1125   APPEARANCEUR CLEAR 01/09/2021 1125   LABSPEC 1.010 01/09/2021 1125   PHURINE 5.0 01/09/2021 1125   GLUCOSEU NEGATIVE 01/09/2021 1125   GLUCOSEU NEGATIVE 09/23/2011 1032   HGBUR NEGATIVE 01/09/2021 1125   HGBUR negative 09/26/2008 0908   BILIRUBINUR NEGATIVE 01/09/2021 1125    KETONESUR NEGATIVE 01/09/2021 1125   PROTEINUR 30 (A) 01/09/2021 1125   UROBILINOGEN 1.0 01/16/2012 1008   NITRITE NEGATIVE 01/09/2021 1125   LEUKOCYTESUR NEGATIVE 01/09/2021 1125     Hollie Wojahn M.D. [facility] 01/14/2021, 12:54 PM  Available via Epic secure chat 7am-7pm After 7 pm, please refer to night coverage provider listed on amion.

## 2021-01-15 DIAGNOSIS — I480 Paroxysmal atrial fibrillation: Secondary | ICD-10-CM | POA: Diagnosis not present

## 2021-01-15 DIAGNOSIS — J9383 Other pneumothorax: Secondary | ICD-10-CM | POA: Diagnosis not present

## 2021-01-15 DIAGNOSIS — J9601 Acute respiratory failure with hypoxia: Secondary | ICD-10-CM | POA: Diagnosis not present

## 2021-01-15 DIAGNOSIS — R0600 Dyspnea, unspecified: Secondary | ICD-10-CM | POA: Diagnosis not present

## 2021-01-15 LAB — CULTURE, BODY FLUID W GRAM STAIN -BOTTLE: Culture: NO GROWTH

## 2021-01-15 LAB — CBC
HCT: 38 % — ABNORMAL LOW (ref 39.0–52.0)
Hemoglobin: 11.3 g/dL — ABNORMAL LOW (ref 13.0–17.0)
MCH: 25.5 pg — ABNORMAL LOW (ref 26.0–34.0)
MCHC: 29.7 g/dL — ABNORMAL LOW (ref 30.0–36.0)
MCV: 85.8 fL (ref 80.0–100.0)
Platelets: 188 10*3/uL (ref 150–400)
RBC: 4.43 MIL/uL (ref 4.22–5.81)
RDW: 15.2 % (ref 11.5–15.5)
WBC: 5.5 10*3/uL (ref 4.0–10.5)
nRBC: 0 % (ref 0.0–0.2)

## 2021-01-15 LAB — BASIC METABOLIC PANEL
Anion gap: 7 (ref 5–15)
BUN: 24 mg/dL — ABNORMAL HIGH (ref 8–23)
CO2: 41 mmol/L — ABNORMAL HIGH (ref 22–32)
Calcium: 9.1 mg/dL (ref 8.9–10.3)
Chloride: 88 mmol/L — ABNORMAL LOW (ref 98–111)
Creatinine, Ser: 0.8 mg/dL (ref 0.61–1.24)
GFR, Estimated: >60 mL/min (ref >60)
Glucose, Bld: 173 mg/dL — ABNORMAL HIGH (ref 70–99)
Potassium: 4.7 mmol/L (ref 3.5–5.1)
Sodium: 136 mmol/L (ref 135–145)

## 2021-01-15 LAB — MISC LABCORP TEST (SEND OUT)
LabCorp test name: 5367
Labcorp test code: 5367

## 2021-01-15 MED ORDER — FUROSEMIDE 10 MG/ML IJ SOLN
20.0000 mg | Freq: Once | INTRAMUSCULAR | Status: AC
  Administered 2021-01-15: 20 mg via INTRAVENOUS
  Filled 2021-01-15: qty 2

## 2021-01-15 MED ORDER — PHENOL 1.4 % MT LIQD
1.0000 | OROMUCOSAL | Status: DC | PRN
  Administered 2021-01-15: 1 via OROMUCOSAL
  Filled 2021-01-15: qty 177

## 2021-01-15 MED ORDER — IPRATROPIUM-ALBUTEROL 0.5-2.5 (3) MG/3ML IN SOLN
3.0000 mL | Freq: Two times a day (BID) | RESPIRATORY_TRACT | Status: DC
  Administered 2021-01-15 – 2021-01-16 (×3): 3 mL via RESPIRATORY_TRACT
  Filled 2021-01-15 (×3): qty 3

## 2021-01-15 NOTE — Progress Notes (Signed)
Mobility Specialist - Progress Note    01/15/21 1443  Mobility  Activity Ambulated in hall  Level of Assistance Minimal assist, patient does 75% or more  Assistive Device Front wheel walker  Distance Ambulated (ft) 250 ft  Mobility Ambulated with assistance in hallway  Mobility Response Tolerated well  Mobility performed by Mobility specialist  $Mobility charge 1 Mobility    Pre-mobility: 107 HR, 127/67 BP, 99% SpO2 During mobility: 139 HR,  86%-96% SpO2  Pt required Min A to sit EOB before ambulating ~250 ft in hallway using RW. Pt was connected to 3L of O2 and stats were recorded above. Pt did not c/o of pain or dizziness, but did experience SOB. Pt was encouraged to take 2 standing rest breaks during session to practice pursed breathing. Pt was returned to bed after session and was left with call bell at side, family in room, and bed alarm on.   [place] Specialist [facility] Phone: [phone] 01/15/21, 2:47 PM

## 2021-01-15 NOTE — Progress Notes (Signed)
   NAME:  Adam Briggs, MRN:  [id number], DOB:  1929/07/21, LOS: 6 ADMISSION DATE:  01/08/2021, CONSULTATION DATE:  01/08/2021 REFERRING MD:  Dr. Tana Coast - [facility] CHIEF COMPLAINT:  Recurrent right pleural effusion  Primary Pulmonologist: Dr. Leory Plowman Icard  PCCM Progress Note/Plan of Care:  Requested by Dr. Tana Coast to reevaluate patient today re: ongoing SOB, persistent loculated R pleural effusion.    Myself and Dr. Loanne Drilling spoke with patient/family at bedside regarding his current symptoms, clinical trajectory and goals for the care of his effusion.  At the time of our conversation, patient was resting comfortably in bed on [facility] with O2 sats 100%. He was noted to be tachycardic to 110s (Afib), but was not experiencing any significant discomfort, SOB or CP. Patient notes that he ambulated in the hallway this afternoon with 3L O2 via [place] and sats dropped to 87% (at lowest) with exertion, but recovered quickly with rest.  Given current clinical status, the PCCM team offered patient repeat palliative thoracentesis prior to discharge for symptomatic relief; however, patient/daughter refused, stating "the fluid will just come back" - which is a likely outcome. We further described a more definitive management strategy with thoracic surgery consult and potential VATS/decortication, however patient/family were not interested in surgical management at this time.  Requested outpatient referral to [facility] for Pulmonology at this time; referral placed.  PCCM will remain available as needed.  Lestine Mount, PA-C [facility] 01/15/21 4:50 PM  Please see [email or web address] for pager details.  From 7A-7P if no response, please call [phone] After hours, please call ELink [phone]

## 2021-01-15 NOTE — Progress Notes (Signed)
[facility]                                                                              Patient Demographics  Adam Briggs, is a 87 y.o. male, DOB - [birth year], [name]  Admit date - 01/08/2021   Admitting Physician Samuella Cota, MD  Outpatient Primary MD for the patient is [name], MD  Outpatient specialists:   LOS - 6  days   Medical records reviewed and are as summarized below:    Chief Complaint  Patient presents with   Shortness of Breath       Brief summary   87 year old male with a history of NSCLC left upper lobe, completed XRT, recurrent right-sided loculated hydropneumothorax, underwent outpatient left thoracentesis for rec pleural effusion per PCCM on 8/19 with removal of 500 cc.  Patient was admitted for acute hypoxic respiratory failure secondary to recurrent increasing loculated pleural effusion.    Assessment & Plan    New worsening left pleural effusion, acute hypoxic respiratory failure loculated right lateral pleural effusion, recurrent -Seen on CT 8/22, underwent left thoracentesis on 8/23 (also underwent thoracentesis right on 8/24). Removed 1.4 L of blood-tinged fluid from left thoracentesis -- Unclear etiology for the recurrent pleural effusions, transudative (patient has NSCLC malignant neoplasm of the LUL lung, completed XRT 11/23/2020, radonc Dr. Lisbeth Renshaw, primary pulmonologist, Dr. [name]) - Cytology 8/23 showed reactive mesothelial cells, numerous lymphoid cells -Appreciate cardiology and GI opinion.  Per cardiology, pleural effusions not from cardiac source.  Discussed in detail with GI, Dr. Rush Landmark, does not appear cirrhosis is the cause for bilateral pleural effusion, no ascites, no portal hypertension LFTs normal, INR normal, no thrombocytopenia.  There could be concern for idiopathic cryptogenic cirrhosis however rare to have only pleural effusion without any underlying liver failure. Recommended trial of  increasing diuretics.  Lasix was increased to 20 mg IV every 12 hours. -Patient's daughter had requested transfer to tertiary care, was declined -Chest x-ray 8/28 showed stable large loculated right pleural effusion, mildly increased left pleural effusion, BNP 357 - will give Lasix 20 mg IV x1 extra dose, continue Lasix 20 mg IV twice daily -Per patient's request, have reconsulted pulmonology, needs outpatient referral to [facility] pulmonology -Home O2 evaluation done, qualifies for 2 L O2 with ambulation   Loculated right lateral pleural effusion, recurrent -Since April/May 2022, nondiagnostic cytology in 08/2020 and 01/05/2021, loculated and persistent -Underwent thoracentesis right lung on 8/24, 520 cc removed -Pulmonology following, appreciate recommendations -See above regarding cardiology and GI opinion  Cirrhosis, noted on RUQ [place]: New diagnosis -2D echo showed EF of 65 to 70%, aortic stenosis.  Previously has been recommended TEE, doubt able to do TEE with his respiratory status -24-hour urine protein normal.  GI was consulted. -US showed no ascites, patent main portal vein, no portal hypertension,  -Upon discharge, will place on Lasix 20 mg daily, spironolactone 50 mg daily and titrate up with BP and as tolerated -Continue IV Lasix for now  Possible aortic stenosis on echo 01/09/2021 -Previously has been recommended TEE -Patient followed by Dr. Percival Spanish, office note on 11/03/2020 had noted moderate aortic stenosis  and repeat echo in 1 year.  No acute concerns were noted. -Per cardiology, no acute issues, bilateral pleural effusions are likely not from cardiac source   Persistent atrial fibrillation -Heart rate controlled, continue metoprolol 25 twice daily -Eliquis resumed  BPH -Continue tamsulosin  History of NSCLC -Outpatient follow-up with pulmonology, radiation oncology, oncology  Essential hypertension -Continue beta-blocker, Lasix  Hyperlipidemia -Continue  statin  Estimated body mass index is 22.96 kg/m as calculated from the following:   Height as of this encounter: 5\' 10"  (1.778 m).   Weight as of this encounter: 72.6 kg.  Code Status: Full code DVT Prophylaxis:  SCDs Start: 01/08/21 1749 apixaban (ELIQUIS) tablet 5 mg   Level of Care: Level of care: Med-Surg Family Communication: Discussed all imaging results, lab results, explained to the patient and daughter at the bedside today.      Disposition Plan:     Status is: Inpatient  Remains inpatient appropriate because:Inpatient level of care appropriate due to severity of illness  Dispo: The patient is from: Home              Anticipated d/c is to: Home              Patient currently is not medically stable to d/c.  Hopefully DC home in 24 to 48 hours, and outpatient follow-up with [facility] pulmonology   Difficult to place patient No   Time Spent in minutes 25 minutes  Procedures:  Thoracentesis left, 1.4 L removed on 8/23 Ultrasound thoracentesis, right 8/24 with 520 cc removed  Consultants:   Pulmonology Cardiology GI  Antimicrobials:   Anti-infectives (From admission, onward)    None          Medications  Scheduled Meds:  apixaban  5 mg Oral BID   budesonide (PULMICORT) nebulizer solution  0.25 mg Nebulization BID   furosemide  20 mg Intravenous BID   ipratropium-albuterol  3 mL Nebulization BID   metoprolol tartrate  25 mg Oral BID   polyethylene glycol  17 g Oral BID   predniSONE  40 mg Oral Q breakfast   senna-docusate  1 tablet Oral QHS   simvastatin  20 mg Oral q1800   sodium chloride flush  3 mL Intravenous Q12H   tamsulosin  0.4 mg Oral q AM   Continuous Infusions:  sodium chloride     PRN Meds:.sodium chloride, acetaminophen **OR** acetaminophen, albuterol, alum & mag hydroxide-simeth, fluticasone, levalbuterol, ondansetron **OR** ondansetron (ZOFRAN) IV, phenol, sodium chloride flush      Subjective:   Adam Briggs was seen and  examined today.  Feeling a little more short of breath today especially on ambulation.  Daughter at the bedside.  No chest pain, nausea or vomiting.  Tolerating diet Objective:   Vitals:   01/15/21 0644 01/15/21 0819 01/15/21 0820 01/15/21 1245  BP: 139/84   136/75  Pulse: (!) 103   79  Resp: 20   18  Temp: 97.8 F (36.6 C)   98.1 F (36.7 C)  TempSrc: Oral   Oral  SpO2: 99% 100% 99% 100%  Weight:      Height:        Intake/Output Summary (Last 24 hours) at 01/15/2021 1422 Last data filed at 01/15/2021 0500 Gross per 24 hour  Intake 360 ml  Output 1150 ml  Net -790 ml     Wt Readings from Last 3 Encounters:  01/08/21 72.6 kg  11/27/20 74.1 kg  11/03/20 73.8 kg   Physical Exam General: Alert  and oriented x 3, NAD Cardiovascular: Irregular Respiratory: Diminished breath sounds at the bases bilaterally with scattered wheezing Gastrointestinal: Soft, nontender, nondistended, NBS Ext: 1+  pedal edema bilaterally Neuro: no new deficits Skin: No rashes Psych: Normal affect and demeanor, alert and oriented x3     Data Reviewed:  I have personally reviewed following labs and imaging studies  Micro Results Recent Results (from the past 240 hour(s))  Resp Panel by RT-PCR (Flu A&B, Covid) Nasopharyngeal Swab     Status: None   Collection Time: 01/08/21  1:02 PM   Specimen: Nasopharyngeal Swab; Nasopharyngeal(NP) swabs in vial transport medium  Result Value Ref Range Status   SARS Coronavirus 2 by RT PCR NEGATIVE NEGATIVE Final    Comment: (NOTE) SARS-CoV-2 target nucleic acids are NOT DETECTED.  The SARS-CoV-2 RNA is generally detectable in upper respiratory specimens during the acute phase of infection. The lowest concentration of SARS-CoV-2 viral copies this assay can detect is 138 copies/mL. A negative result does not preclude SARS-Cov-2 infection and should not be used as the sole basis for treatment or other patient management decisions. A negative result may occur  with  improper specimen collection/handling, submission of specimen other than nasopharyngeal swab, presence of viral mutation(s) within the areas targeted by this assay, and inadequate number of viral copies(<138 copies/mL). A negative result must be combined with clinical observations, patient history, and epidemiological information. The expected result is Negative.  Fact Sheet for Patients:  EntrepreneurPulse.com.au  Fact Sheet for Healthcare Providers:  IncredibleEmployment.be  This test is no t yet approved or cleared by the [place] FDA and  has been authorized for detection and/or diagnosis of SARS-CoV-2 by FDA under an Emergency Use Authorization (EUA). This EUA will remain  in effect (meaning this test can be used) for the duration of the COVID-19 declaration under Section 564(b)(1) of the Act, 21 U.S.C.section 360bbb-3(b)(1), unless the authorization is terminated  or revoked sooner.       Influenza A by PCR NEGATIVE NEGATIVE Final   Influenza B by PCR NEGATIVE NEGATIVE Final    Comment: (NOTE) The Xpert Xpress SARS-CoV-2/FLU/RSV plus assay is intended as an aid in the diagnosis of influenza from Nasopharyngeal swab specimens and should not be used as a sole basis for treatment. Nasal washings and aspirates are unacceptable for Xpert Xpress SARS-CoV-2/FLU/RSV testing.  Fact Sheet for Patients: EntrepreneurPulse.com.au  Fact Sheet for Healthcare Providers: IncredibleEmployment.be  This test is not yet approved or cleared by the [place] FDA and has been authorized for detection and/or diagnosis of SARS-CoV-2 by FDA under an Emergency Use Authorization (EUA). This EUA will remain in effect (meaning this test can be used) for the duration of the COVID-19 declaration under Section 564(b)(1) of the Act, 21 U.S.C. section 360bbb-3(b)(1), unless the authorization is terminated  or revoked.  Performed at [facility], [place] [address] [place], [place] [address]   Body fluid culture w Gram Stain     Status: None   Collection Time: 01/09/21  3:43 PM   Specimen: PATH Cytology Pleural fluid  Result Value Ref Range Status   Specimen Description   Final    PLEURAL Performed at [place] [address], [place], [place] [address]    Special Requests   Final    NONE Performed at [facility], [place] [address], [place], [place] [address]    Gram Stain   Final    RARE WBC PRESENT,BOTH PMN AND MONONUCLEAR NO ORGANISMS  SEEN    Culture   Final    NO GROWTH 3 DAYS Performed at [facility], [place] [address], [place], [place] [address]    Report Status 01/13/2021 FINAL  Final  Acid Fast Smear (AFB)     Status: None   Collection Time: 01/10/21 12:21 PM   Specimen: PATH Cytology Pleural fluid  Result Value Ref Range Status   AFB Specimen Processing Concentration  Final   Acid Fast Smear Negative  Final    Comment: (NOTE) Performed At: [facility] [place], [place] [id number] Rush Farmer MD XB:[id number]    Source (AFB) PLEURAL  Final    Comment: Performed at [facility], [place] [address], [place], [place] [address]  Culture, body fluid w Gram Stain-bottle     Status: None   Collection Time: 01/10/21 12:21 PM   Specimen: Fluid  Result Value Ref Range Status   Specimen Description FLUID PLEURAL  Final   Special Requests BOTTLES DRAWN AEROBIC AND ANAEROBIC  Final   Culture   Final    NO GROWTH 5 DAYS Performed at [facility], [place] [address], [place], [place] [address]    Report Status 01/15/2021 FINAL  Final  Gram stain     Status: None   Collection Time: 01/10/21 12:21 PM   Specimen: Fluid  Result Value Ref Range Status   Specimen Description FLUID PLEURAL  Final   Special Requests NONE  Final   Gram Stain   Final    FEW SQUAMOUS EPITHELIAL  CELLS PRESENT MODERATE WBC PRESENT, PREDOMINANTLY MONONUCLEAR NO ORGANISMS SEEN Performed at [facility], [place] [address], [place], [place] [address]    Report Status 01/11/2021 FINAL  Final    Radiology Reports DG Chest 1 View  Result Date: 01/10/2021 CLINICAL DATA:  Status post right thoracentesis. EXAM: CHEST  1 VIEW COMPARISON:  01/09/2021 FINDINGS: Large loculated right pleural effusion is again identified. There has been slight decrease in volume of the right pleural effusion with mildly improved aeration to the right mid lung. No definite pneumothorax identified. Small left pleural effusion appears unchanged. IMPRESSION: 1. Slight decrease in volume of large loculated right pleural effusion status post thoracentesis. 2. No pneumothorax. Electronically Signed   By: Kerby Moors M.D.   On: 01/10/2021 13:02   DG Chest 1 View  Result Date: 01/09/2021 CLINICAL DATA:  Status post left thoracentesis EXAM: CHEST  1 VIEW COMPARISON:  01/08/2021 FINDINGS: Single frontal view of the chest demonstrates near complete resolution of the left pleural effusion after interval left-sided thoracentesis. No evidence of left-sided pneumothorax. Minimal consolidation at the left lung base consistent with residual atelectasis. There is persistent loculated right pleural effusion consistent with known empyema. Multifocal consolidation throughout the right lung is stable. Cardiac silhouette is unchanged. No acute bony abnormalities. IMPRESSION: 1. No complication after left-sided thoracentesis. Near complete resolution of left pleural effusion. 2. Stable known right empyema and underlying right lung consolidation. Electronically Signed   By: Randa Ngo M.D.   On: 01/09/2021 15:57   DG Chest 2 View  Result Date: 01/08/2021 CLINICAL DATA:  Increased shortness of breath EXAM: CHEST - 2 VIEW COMPARISON:  Chest x-ray dated January 05, 2021 FINDINGS: Visualized cardiac and mediastinal contours are unchanged.  Increased size of moderate to large loculated right pleural effusion. Persistent right lung consolidation. Small left pleural effusion. No pneumothorax. IMPRESSION: Increased size of moderate to large loculated right pleural effusion when compared with most  recent prior x-ray. Increased size of small left pleural effusion. Electronically Signed   By: Yetta Glassman M.D.   On: 01/08/2021 13:56   DG Chest 2 View  Result Date: 01/02/2021 CLINICAL DATA:  Dyspnea. EXAM: CHEST - 2 VIEW COMPARISON:  November 27, 2020. FINDINGS: Stable cardiomediastinal silhouette. No definite pneumothorax is noted. Stable large probable loculated right pleural effusion is noted extending from the apex to the base along the lateral wall. Atelectasis or infiltrates are noted in the aerated portions of the right lung. Minimal left basilar subsegmental atelectasis is noted. Bony thorax is unremarkable. IMPRESSION: Stable large loculated right pleural effusion is noted. Atelectasis or infiltrates are noted in the aerated portions of the right lung. Minimal left basilar subsegmental atelectasis. Electronically Signed   By: Marijo Conception M.D.   On: 01/02/2021 16:53   CT CHEST WO CONTRAST  Result Date: 01/08/2021 CLINICAL DATA:  Non-small cell lung cancer. Status post radiotherapy. Recurrent right-sided loculated hydropneumothorax. EXAM: CT CHEST WITHOUT CONTRAST TECHNIQUE: Multidetector CT imaging of the chest was performed following the standard protocol without IV contrast. COMPARISON:  10/02/2020 FINDINGS: Cardiovascular: Moderate cardiac enlargement. No pericardial effusion identified. Aortic atherosclerosis. Coronary artery calcifications. Mediastinum/Nodes: Normal appearance of the thyroid gland. The trachea appears patent and is midline. Normal appearance of the esophagus. No enlarged axillary lymph nodes. Multiple prominent mediastinal lymph nodes are again identified. None of these meet CT criteria for adenopathy. Index nodes  include: -left pre-vascular lymph node measures 1.1 cm, image 58/2. Unchanged from previous exam. Right paratracheal lymph node measures 1.1 cm, image 46/2. Previously 0.8 cm. Hilar lymph nodes are suboptimally evaluated due to lack of IV contrast material. Lungs/Pleura: Moderate loculated right pleural effusion is again noted and does not appear significantly improved in volume when compared with 10/02/2020. Decrease gas within this fluid collection noted compared with 10/02/2020. Significantly diminished aeration to the right lung. Overlying areas of pleural thickening and presumed rounded atelectasis noted within the right middle lobe and right lower lobe. Multiple areas of subsegmental atelectasis also noted within the aerated portions of the right lung. Moderate to large left pleural effusion has increased in volume from the previous exam. Treated tumor within the anteromedial left upper lobe measures 1.7 cm, image 39/5. Previously 1.8 cm. Patchy areas of ground-glass and airspace density within the anterior left upper lobe is nonspecific, but favored to represent sequelae of external beam radiation. New atelectasis of the inferior lingula, image 114/5. Upper Abdomen: No acute findings within the imaged portions of the upper abdomen. Aortic atherosclerosis noted. Musculoskeletal: Spondylosis within the thoracic spine. No acute or significant osseous findings. IMPRESSION: 1. Stable appearance of treated tumor within the anteromedial left upper lobe. Adjacent area of ground-glass and airspace density is favored to represent sequelae of external beam radiation. 2. Large left pleural effusion has significantly increased in volume when compared with 10/02/2020. 3. No significant change in volume of loculated right pleural effusion, previously characterized as empyema. Volume of gas within the right pleural fluid collection has decreased in the interval. 4. As before there is significantly diminished aeration to the  right lung. Similar to previous exam is diffuse pleural thickening overlying the loculated right pleural fluid collection with areas of presumed rounded atelectasis as well as segmental and subsegmental atelectasis. 5. Aortic atherosclerosis and coronary artery calcifications. Aortic Atherosclerosis (ICD10-I70.0). Electronically Signed   By: Kerby Moors M.D.   On: 01/08/2021 19:01   DG Chest Port 1 View  Result Date: 01/14/2021 CLINICAL DATA:  Wheezing. EXAM: PORTABLE CHEST 1 VIEW COMPARISON:  January 10, 2021. FINDINGS: Stable cardiomediastinal silhouette. Stable large loculated right pleural effusion is noted with associated atelectasis. Increased left pleural effusion is noted with associated left basilar atelectasis or infiltrate. Bony thorax is unremarkable. IMPRESSION: Stable large loculated right pleural effusion with associated atelectasis. Mildly increased left pleural effusion is noted with associated atelectasis or infiltrate. Electronically Signed   By: Marijo Conception M.D.   On: 01/14/2021 13:32   DG CHEST PORT 1 VIEW  Result Date: 01/05/2021 CLINICAL DATA:  Right thoracentesis, short of breath EXAM: PORTABLE CHEST 1 VIEW COMPARISON:  01/02/2021, 10/02/2020 FINDINGS: Single frontal view of the chest demonstrates slight decrease in the right pleural effusion seen previously. Underlying right lung consolidation and pleural thickening again noted and unchanged. No evidence of postprocedural pneumothorax. Fiduciary marker identified overlying the left anterior first rib at the site of the known left upper lobe pulmonary nodule. Trace left pleural effusion versus pleural thickening identified. There are no acute bony abnormalities. Cardiac silhouette is stable. IMPRESSION: 1. Slight decrease in loculated right pleural fluid after thoracentesis. No evidence of postprocedural complication. 2. Persistent right lung consolidation and pleural thickening compatible with previous history of empyema. 3.  Fiduciary marker left upper lobe compatible with known pulmonary nodule. Electronically Signed   By: Randa Ngo M.D.   On: 01/05/2021 15:49   ECHOCARDIOGRAM COMPLETE  Result Date: 01/09/2021    ECHOCARDIOGRAM REPORT   Patient Name:   MUADH CREASY Date of Exam: 01/09/2021 Medical Rec #:  [id number]     Height:       70.0 in Accession #:    [id number]    Weight:       160.0 lb Date of Birth:  1930-04-20     BSA:          1.898 m Patient Age:    56 years      BP:           118/68 mmHg Patient Gender: M             HR:           82 bpm. Exam Location:  Inpatient Procedure: 2D Echo, Cardiac Doppler and Color Doppler Indications:    Cardiomyopathy-Unspecified I42.9  History:        Patient has prior history of Echocardiogram examinations, most                 recent 09/20/2020. Aortic Valve Disease; Arrythmias:Atrial                 Fibrillation. Non-small cell lung cancer. Right pneumothorax.                 Acute hypoxemic respiratory failure.  Sonographer:    Darlina Sicilian RDCS Referring Phys: [phone] [facility]  Sonographer Comments: Suboptimal parasternal window, suboptimal apical window and suboptimal subcostal window. Image acquisition challenging due to respiratory motion. IMPRESSIONS  1. Largely nondiagnostic study due to poor acoustic windows for imaging. Limited findings as noted below.  2. Left ventricular ejection fraction, by estimation, is 65 to 70%. The left ventricle has normal function. Left ventricular diastolic parameters are indeterminate.  3. Large pleural effusion.  4. The mitral valve is degenerative.  5. The aortic valve is abnormal. FINDINGS  Left Ventricle: Left ventricular ejection fraction, by estimation, is 65 to 70%. The left ventricle has normal function. Definity contrast agent was given IV to delineate the left ventricular endocardial borders. Left ventricular  diastolic parameters are indeterminate. Mitral Valve: The mitral valve is degenerative in appearance. Aortic Valve: The  aortic valve is abnormal. Additional Comments: There is a large pleural effusion.  LEFT VENTRICLE PLAX 2D LVOT diam:     1.60 cm LVOT Area:     2.01 cm   AORTA Ao Root diam: 3.00 cm MITRAL VALVE MV Area (PHT): 4.30 cm     SHUNTS MV Decel Time: 176 msec     Systemic Diam: 1.60 cm MV E velocity: 109.33 cm/s Cherlynn Kaiser MD Electronically signed by Cherlynn Kaiser MD Signature Date/Time: 01/09/2021/5:29:15 PM    Final    VAS [place] LOWER EXTREMITY VENOUS (DVT)  Result Date: 01/10/2021  Lower Venous DVT Study Patient Name:  DARVIN DIALS  Date of Exam:   01/10/2021 Medical Rec #: [id number]      Accession #:    [id number] Date of Birth: 1930-01-14      Patient Gender: M Patient Age:   57 years Exam Location:  [facility] Procedure:      VAS [place] LOWER EXTREMITY VENOUS (DVT) Referring Phys: MURALI RAMASWAMY --------------------------------------------------------------------------------  Indications: Edema.  Risk Factors: None identified. Comparison Study: No prior studies. Performing Technologist: Oliver Hum RVT  Examination Guidelines: A complete evaluation includes B-mode imaging, spectral Doppler, color Doppler, and power Doppler as needed of all accessible portions of each vessel. Bilateral testing is considered an integral part of a complete examination. Limited examinations for reoccurring indications may be performed as noted. The reflux portion of the exam is performed with the patient in reverse Trendelenburg.  +---------+---------------+---------+-----------+----------+--------------+ RIGHT    CompressibilityPhasicitySpontaneityPropertiesThrombus Aging +---------+---------------+---------+-----------+----------+--------------+ CFV      Full           Yes      Yes                                 +---------+---------------+---------+-----------+----------+--------------+ SFJ      Full                                                         +---------+---------------+---------+-----------+----------+--------------+ FV Prox  Full                                                        +---------+---------------+---------+-----------+----------+--------------+ FV Mid   Full                                                        +---------+---------------+---------+-----------+----------+--------------+ FV DistalFull                                                        +---------+---------------+---------+-----------+----------+--------------+ PFV      Full                                                        +---------+---------------+---------+-----------+----------+--------------+  POP      Full           Yes      Yes                                 +---------+---------------+---------+-----------+----------+--------------+ PTV      Full                                                        +---------+---------------+---------+-----------+----------+--------------+ PERO     Full                                                        +---------+---------------+---------+-----------+----------+--------------+   +---------+---------------+---------+-----------+----------+--------------+ LEFT     CompressibilityPhasicitySpontaneityPropertiesThrombus Aging +---------+---------------+---------+-----------+----------+--------------+ CFV      Full           Yes      Yes                                 +---------+---------------+---------+-----------+----------+--------------+ SFJ      Full                                                        +---------+---------------+---------+-----------+----------+--------------+ FV Prox  Full                                                        +---------+---------------+---------+-----------+----------+--------------+ FV Mid   Full                                                         +---------+---------------+---------+-----------+----------+--------------+ FV DistalFull                                                        +---------+---------------+---------+-----------+----------+--------------+ PFV      Full                                                        +---------+---------------+---------+-----------+----------+--------------+ POP      Full           Yes      Yes                                 +---------+---------------+---------+-----------+----------+--------------+  PTV      Full                                                        +---------+---------------+---------+-----------+----------+--------------+ PERO     Full                                                        +---------+---------------+---------+-----------+----------+--------------+     Summary: RIGHT: - There is no evidence of deep vein thrombosis in the lower extremity.  - No cystic structure found in the popliteal fossa.  LEFT: - There is no evidence of deep vein thrombosis in the lower extremity.  - No cystic structure found in the popliteal fossa.  *See table(s) above for measurements and observations. Electronically signed by Harold Barban MD on 01/10/2021 at 8:06:44 PM.    Final    US Abdomen Limited RUQ (LIVER/GB)  Result Date: 01/08/2021 CLINICAL DATA:  Right pleural effusion and suspected cirrhosis. EXAM: ULTRASOUND ABDOMEN LIMITED RIGHT UPPER QUADRANT COMPARISON:  None. FINDINGS: Gallbladder: No gallstones or wall thickening visualized. No sonographic Murphy sign noted by sonographer. Common bile duct: Diameter: 4 mm Liver: There is slight heterogeneity of the liver echogenicity with coarsened echotexture and mild surface irregularity, consistent with changes of cirrhosis. No discrete mass identified. Portal vein is patent on color Doppler imaging with normal direction of blood flow towards the liver. Other: Partially visualized right pleural effusion.  IMPRESSION: 1. Cirrhosis. 2. Patent main portal vein with hepatopetal flow. 3. No ascites. 4. Right pleural effusion. Electronically Signed   By: Anner Crete M.D.   On: 01/08/2021 19:51   US THORACENTESIS ASP PLEURAL SPACE W/IMG GUIDE  Result Date: 01/10/2021 INDICATION: Patient with history of lung and prostate cancers, bilateral pleural effusions, cirrhosis by imaging; request received for diagnostic and therapeutic right thoracentesis. EXAM: ULTRASOUND GUIDED DIAGNOSTIC AND THERAPEUTIC RIGHT THORACENTESIS MEDICATIONS: 1% lidocaine to skin and subcutaneous tissue COMPLICATIONS: None immediate. PROCEDURE: An ultrasound guided thoracentesis was thoroughly discussed with the patient/daughter and questions answered. The benefits, risks, alternatives and complications were also discussed. The patient understands and wishes to proceed with the procedure. Written consent was obtained. Ultrasound was performed to localize and mark an adequate pocket of fluid in the right chest. The area was then prepped and draped in the normal sterile fashion. 1% Lidocaine was used for local anesthesia. Under ultrasound guidance a 6 Fr Safe-T-Centesis catheter was introduced. Thoracentesis was performed. The catheter was removed and a dressing applied. FINDINGS: A total of approximately 520 cc of amber/blood-tinged fluid was removed. Samples were sent to the laboratory as requested by the clinical team. Due to patient chest discomfort only the above amount of fluid was removed today. IMPRESSION: Successful ultrasound guided diagnostic and therapeutic right thoracentesis yielding 520 cc of pleural fluid. Read by: Rowe Robert, PA-C Electronically Signed   By: Markus Daft M.D.   On: 01/10/2021 12:41   US THORACENTESIS ASP PLEURAL SPACE W/IMG GUIDE  Result Date: 01/10/2021 INDICATION: Patient with history of non-small cell lung cancer, left pleural effusion; request for left thoracentesis EXAM: ULTRASOUND GUIDED DIAGNOSTIC AND  THERAPEUTIC LEFT THORACENTESIS MEDICATIONS: 5 ML 1% LIDOCAINE COMPLICATIONS: None  immediate. PROCEDURE: An ultrasound guided thoracentesis was thoroughly discussed with the patient and questions answered. The benefits, risks, alternatives and complications were also discussed. The patient understands and wishes to proceed with the procedure. Written consent was obtained. Ultrasound was performed to localize and mark an adequate pocket of fluid in the left chest. The area was then prepped and draped in the normal sterile fashion. 1% Lidocaine was used for local anesthesia. Under ultrasound guidance a 6 Fr Safe-T-Centesis catheter was introduced. Thoracentesis was performed. The catheter was removed and a dressing applied. FINDINGS: A total of approximately 1.4 L of blood-tinged, amber fluid was removed. Samples were sent to the laboratory as requested by the clinical team. IMPRESSION: Successful ultrasound guided diagnostic and therapeutic left thoracentesis yielding 1.4 L of pleural fluid. Read by: Narda Rutherford, NP Electronically Signed   By: Lucrezia Europe M.D.   On: 01/09/2021 15:40    Lab Data:  CBC: Recent Labs  Lab 01/10/21 1335 01/11/21 0448 01/12/21 0521 01/13/21 0620 01/15/21 0459  WBC 6.6 6.0 5.6 5.1 5.5  HGB 11.4* 11.7* 11.7* 10.9* 11.3*  HCT 38.4* 39.8 39.9 37.8* 38.0*  MCV 85.7 85.6 86.4 86.9 85.8  PLT 211 214 187 174 211   Basic Metabolic Panel: Recent Labs  Lab 01/09/21 0452 01/12/21 0521 01/13/21 0620 01/15/21 0459  NA 137 138 140 136  K 4.0 4.8 4.4 4.7  CL 96* 95* 94* 88*  CO2 33* 37* 43* 41*  GLUCOSE 97 120* 131* 173*  BUN 27* 32* 30* 24*  CREATININE 0.98 0.83 0.80 0.80  CALCIUM 8.5* 8.8* 8.7* 9.1   GFR: Estimated Creatinine Clearance: 64.3 mL/min (by C-G formula based on SCr of 0.8 mg/dL). Liver Function Tests: No results for input(s): AST, ALT, ALKPHOS, BILITOT, PROT, ALBUMIN in the last 168 hours.  No results for input(s): LIPASE, AMYLASE in the last 168  hours. No results for input(s): AMMONIA in the last 168 hours. Coagulation Profile: Recent Labs  Lab 01/12/21 0521  INR 1.1   Cardiac Enzymes: No results for input(s): CKTOTAL, CKMB, CKMBINDEX, TROPONINI in the last 168 hours. BNP (last 3 results) Recent Labs    09/15/20 1132  PROBNP 238.0*   HbA1C: No results for input(s): HGBA1C in the last 72 hours. CBG: No results for input(s): GLUCAP in the last 168 hours. Lipid Profile: No results for input(s): CHOL, HDL, LDLCALC, TRIG, CHOLHDL, LDLDIRECT in the last 72 hours. Thyroid Function Tests: No results for input(s): TSH, T4TOTAL, FREET4, T3FREE, THYROIDAB in the last 72 hours. Anemia Panel: No results for input(s): VITAMINB12, FOLATE, FERRITIN, TIBC, IRON, RETICCTPCT in the last 72 hours. Urine analysis:    Component Value Date/Time   COLORURINE YELLOW 01/09/2021 1125   APPEARANCEUR CLEAR 01/09/2021 1125   LABSPEC 1.010 01/09/2021 1125   PHURINE 5.0 01/09/2021 1125   GLUCOSEU NEGATIVE 01/09/2021 1125   GLUCOSEU NEGATIVE 09/23/2011 1032   HGBUR NEGATIVE 01/09/2021 1125   HGBUR negative 09/26/2008 0908   BILIRUBINUR NEGATIVE 01/09/2021 1125   KETONESUR NEGATIVE 01/09/2021 1125   PROTEINUR 30 (A) 01/09/2021 1125   UROBILINOGEN 1.0 01/16/2012 1008   NITRITE NEGATIVE 01/09/2021 1125   LEUKOCYTESUR NEGATIVE 01/09/2021 1125     Tennyson Kallen M.D. [facility] 01/15/2021, 2:22 PM  Available via Epic secure chat 7am-7pm After 7 pm, please refer to night coverage provider listed on amion.

## 2021-01-15 NOTE — TOC Initial Note (Signed)
Transition of Care [facility]) - Initial/Assessment Note    Patient Details  Name: Adam Briggs MRN: [id number] Date of Birth: 1930-01-06  Transition of Care [facility]) CM/SW Contact:    Adam Mage, LCSW Phone Number: 01/15/2021, 1:22 PM  Clinical Narrative:   Patient seen in follow up to Briggs recommendation of Adam Briggs.   [name] is hoping to d/c tomorrow to his home in [place], his daughter was bedside and states she lives next door, has been spending nights at his place and plans to stay with him round the clock post d/c.  He is amenable to a referral for [facility] and for home O2.  Orders seen and appreciated.  Spoke with Adam Briggs with Adam Briggs who confirmed ability to provide [facility] services, Adam Briggs with Adam Briggs who will arrange for delivery of travel O2 cannister to room, home unit. TOC will continue to follow during the course of hospitalization.              Expected Discharge Plan: [place] Barriers to Discharge: No Barriers Identified   Patient Goals and CMS Choice     Choice offered to / list presented to : Patient, Adult Children  Expected Discharge Plan and Services Expected Discharge Plan: [place]   Discharge Planning Services: CM Consult Post Acute Care Choice: [place] arrangements for the past 2 months: Single Family Home                                      Prior Living Arrangements/Services Living arrangements for the past 2 months: Single Family Home Lives with:: Self Patient language and need for interpreter reviewed:: Yes Do you feel safe going back to the place where you live?: Yes      Need for Family Participation in Patient Care: Yes (Comment) Care giver support system in place?: Yes (comment)   Criminal Activity/Legal Involvement Pertinent to Current Situation/Hospitalization: No - Comment as needed  Activities of Daily Living Home Assistive Devices/Equipment: Eyeglasses (reading glasses) ADL Screening  (condition at time of admission) Patient's cognitive ability adequate to safely complete daily activities?: Yes Is the patient deaf or have difficulty hearing?: Yes (feels like something is down in his right ear) Does the patient have difficulty seeing, even when wearing glasses/contacts?: No Does the patient have difficulty concentrating, remembering, or making decisions?: No Patient able to express need for assistance with ADLs?: Yes Does the patient have difficulty dressing or bathing?: No Independently performs ADLs?: Yes (appropriate for developmental age) Does the patient have difficulty walking or climbing stairs?: No Weakness of Legs: None Weakness of Arms/Hands: None  Permission Sought/Granted Permission sought to share information with : Family Supports Permission granted to share information with : Yes, Verbal Permission Granted  Share Information with NAME: Adam Briggs (Daughter)   [phone]           Emotional Assessment Appearance:: Appears stated age Attitude/Demeanor/Rapport: Engaged Affect (typically observed): Appropriate Orientation: : Oriented to Self, Oriented to Place, Oriented to Situation Alcohol / Substance Use: Not Applicable Psych Involvement: No (comment)  Admission diagnosis:  Pleural effusion [J90] Dyspnea, unspecified type [R06.00] Patient Active Problem List   Diagnosis Date Noted   Pressure injury of skin 01/14/2021   Pleural effusion, left 01/09/2021   Liver lesion 01/09/2021   Other cirrhosis of liver ([place]) 01/09/2021   Loculated right lateral pneumothorax 01/08/2021   Leg  swelling 11/27/2020   Malignant neoplasm of upper lobe of left lung ([place]) 10/31/2020   Recurrent right pleural effusion    Atrial fibrillation (HCC)    S/P thoracentesis    SOB (shortness of breath) 09/15/2020   Pleural effusion, right 09/15/2020   Acute hypoxemic respiratory failure ([place]) 09/15/2020   Atrial fibrillation, new onset ([place]) 09/15/2020   Wheezing  04/24/2019   Routine general medical examination at a health care facility 06/23/2014   Inguinal hernia 05/05/2014   Moderate aortic stenosis 10/30/2011   Abnormal ECG 09/24/2010   Type 2 diabetes mellitus with hyperlipidemia ([place]) 03/26/2010   Hyperlipidemia associated with type 2 diabetes mellitus ([place]) 12/28/2007   Essential hypertension 02/07/2007   PCP:  Adam Koch, MD Pharmacy:   [facility] [address] [place] [address] [place] [address] Phone: [phone] Fax: [phone]  [facility] - [place], [place] [address] Phone: [phone] Fax: [phone]     Social Determinants of Health (SDOH) Interventions    Readmission Risk Interventions No flowsheet data found.

## 2021-01-15 NOTE — Progress Notes (Addendum)
Physical Therapy Treatment Patient Details Name: Adam Briggs MRN: [id number] DOB: 31-Dec-1929 Today's Date: 01/15/2021   SATURATION QUALIFICATIONS: (This note is used to comply with regulatory documentation for home oxygen)  Patient Saturations on Room Air at Rest = 92%  Patient Saturations on Room Air while Ambulating = 87%  Patient Saturations on 2 Liters of oxygen while Ambulating = 93%     History of Present Illness Pt is 87 yo male admitted on 01/08/21 with acute resp failure secondary to recurrent loculated pleural effusion. Had L thoracentesis on 01/09/21 and R thoracentesis on 8/24.  Unclear etiology for recurrent pleural effusions (had GI and cardio consults) - plan is to transfer to tertiary facility next week for further testing. Pt with hs of NSCLC L upper lobe completed XRT, afib, DM, HTN, prostate CA.    PT Comments    Pt continues to participate well. Family reports that he is waiting to be transferred to [facility] but there is no bed availability. Will continue to follow.     Follow Up Recommendations  Home health PT;Supervision for mobility/OOB     Equipment Recommendations  None recommended by PT    Recommendations for Other Services       Precautions / Restrictions Precautions Precautions: Fall Precaution Comments: monitor O2 Restrictions Weight Bearing Restrictions: No    Mobility  Bed Mobility Overal bed mobility: Needs Assistance Bed Mobility: Supine to Sit     Supine to sit: Supervision;HOB elevated     General bed mobility comments: Supv for safety    Transfers Overall transfer level: Needs assistance Equipment used: Rolling walker (2 wheeled) Transfers: Sit to/from Stand Sit to Stand: Supervision         General transfer comment: Supv for safety  Ambulation/Gait Ambulation/Gait assistance: Min guard Gait Distance (Feet): 65 Feet (15'x1; 65;x1) Assistive device: Rolling walker (2 wheeled) Gait Pattern/deviations: Step-through  pattern;Decreased stride length     General Gait Details: Min guard for safety. Dyspnea 2/4. O2 87% on RA, 93% on 2L   Stairs             Wheelchair Mobility    Modified Rankin (Stroke Patients Only)       Balance Overall balance assessment: Needs assistance         Standing balance support: Bilateral upper extremity supported;During functional activity Standing balance-Leahy Scale: Fair                              Cognition Arousal/Alertness: Awake/alert Behavior During Therapy: WFL for tasks assessed/performed Overall Cognitive Status: Within Functional Limits for tasks assessed                                        Exercises      General Comments        Pertinent Vitals/Pain Pain Assessment: No/denies pain    Home Living                      Prior Function            PT Goals (current goals can now be found in the care plan section) Progress towards PT goals: Progressing toward goals    Frequency    Min 3X/week      PT Plan Current plan remains appropriate    Co-evaluation  AM-PAC PT "6 Clicks" Mobility   Outcome Measure  Help needed turning from your back to your side while in a flat bed without using bedrails?: A Little Help needed moving from lying on your back to sitting on the side of a flat bed without using bedrails?: A Little Help needed moving to and from a bed to a chair (including a wheelchair)?: A Little Help needed standing up from a chair using your arms (e.g., wheelchair or bedside chair)?: A Little Help needed to walk in hospital room?: A Little Help needed climbing 3-5 steps with a railing? : A Little 6 Click Score: 18    End of Session Equipment Utilized During Treatment: Gait belt Activity Tolerance: Patient tolerated treatment well Patient left: in chair;with call bell/phone within reach;with chair alarm set;with family/visitor present   PT Visit  Diagnosis: Other abnormalities of gait and mobility (R26.89);Muscle weakness (generalized) (M62.81)     Time: 1003-4961 PT Time Calculation (min) (ACUTE ONLY): 21 min  Charges:  $Gait Training: 8-22 mins                         Doreatha Massed, PT Acute Rehabilitation  Office: [phone] Pager: [phone]

## 2021-01-16 DIAGNOSIS — J9601 Acute respiratory failure with hypoxia: Secondary | ICD-10-CM | POA: Diagnosis not present

## 2021-01-16 DIAGNOSIS — J9383 Other pneumothorax: Secondary | ICD-10-CM | POA: Diagnosis not present

## 2021-01-16 DIAGNOSIS — M7989 Other specified soft tissue disorders: Secondary | ICD-10-CM | POA: Diagnosis not present

## 2021-01-16 DIAGNOSIS — I480 Paroxysmal atrial fibrillation: Secondary | ICD-10-CM | POA: Diagnosis not present

## 2021-01-16 MED ORDER — FUROSEMIDE 20 MG PO TABS
20.0000 mg | ORAL_TABLET | Freq: Every day | ORAL | Status: DC
  Administered 2021-01-16: 20 mg via ORAL
  Filled 2021-01-16: qty 1

## 2021-01-16 MED ORDER — FUROSEMIDE 20 MG PO TABS: 20.0000 mg | ORAL_TABLET | Freq: Every day | ORAL | 3 refills | Status: DC

## 2021-01-16 MED ORDER — LEVALBUTEROL TARTRATE 45 MCG/ACT IN AERO: 1.0000 | INHALATION_SPRAY | RESPIRATORY_TRACT | 2 refills | Status: DC | PRN

## 2021-01-16 MED ORDER — SPIRONOLACTONE 50 MG PO TABS
50.0000 mg | ORAL_TABLET | Freq: Every day | ORAL | 3 refills | Status: DC
Start: 2021-01-16 — End: 2021-05-10

## 2021-01-16 MED ORDER — SPIRONOLACTONE 25 MG PO TABS
50.0000 mg | ORAL_TABLET | Freq: Every day | ORAL | Status: DC
  Administered 2021-01-16: 50 mg via ORAL
  Filled 2021-01-16: qty 2

## 2021-01-16 NOTE — Progress Notes (Signed)
Mobility Specialist - Refusal Note  Refusal: Pt is preparing for d/c and refused mobility at this time. No specific reason given.    [place] Specialist Acute Rehabilitation Services Phone: [phone] 01/16/21, 3:41 PM

## 2021-01-16 NOTE — TOC Transition Note (Signed)
Transition of Care [facility]) - CM/SW Discharge Note   Patient Details  Name: Adam Briggs MRN: [id number] Date of Birth: 1929-07-23  [facility]) CM/SW Contact:  Trish Mage, LCSW Phone Number: 01/16/2021, 1:02 PM   Clinical Narrative:   Patient who is stable for d/c today will be transported home by daughter.  Oxygen delivered by [employer].  Wheelchair delivered byt [facility].  [place] services from [place].  No further needs identified.  TOC sign off.    Final next level of care: Home w Home Health Services Barriers to Discharge: No Barriers Identified   Patient Goals and CMS Choice     Choice offered to / list presented to : Patient, Adult Children  Discharge Placement                       Discharge Plan and Services   Discharge Planning Services: CM Consult Post Acute Care Choice: Home Health                               Social Determinants of Health (SDOH) Interventions     Readmission Risk Interventions No flowsheet data found.

## 2021-01-16 NOTE — Discharge Summary (Signed)
Physician Discharge Summary   Patient ID: Adam Briggs MRN: [id number] DOB/AGE: Jan 23, 1930 87 y.o.  Admit date: 01/08/2021 Discharge date: 01/16/2021  Primary Care Physician:  [name], MD   Recommendations for Outpatient Follow-up:  Follow up with PCP in 1-2 weeks Please obtain BMET in 1 week to follow renal function, potassium Started on Lasix 20 mg daily, Aldactone 50 mg daily Referral to [facility] pulmonology sent.  Home Health: Home health PT, RN, home health aide Equipment/Devices: Home O2 2 L, wheelchair  SATURATION QUALIFICATIONS:   Patient Saturations on Room Air at Rest = 92%   Patient Saturations on Room Air while Ambulating = 87%   Patient Saturations on 2 Liters of oxygen while Ambulating = 93%     Discharge Condition: stable  CODE STATUS: FULL  Diet recommendation: Heart healthy diet   Discharge Diagnoses:         Acute respiratory failure with hypoxia Recurrent right loculated pleural effusion status post thoracentesis Recurrent left pleural effusion status post thoracentesis Cirrhosis, noted on RUQ [place], new diagnosis  Persistent atrial fibrillation (HCC)  Malignant neoplasm of upper lobe of left lung (HCC)  Leg swelling   Consults:   Pulmonology Cardiology Gastroenterology, Dr. Rush Landmark    Allergies:   Allergies  Allergen Reactions   Lisinopril Rash     DISCHARGE MEDICATIONS: Allergies as of 01/16/2021       Reactions   Lisinopril Rash        Medication List     STOP taking these medications    albuterol 108 (90 Base) MCG/ACT inhaler Commonly known as: VENTOLIN HFA       TAKE these medications    apixaban 5 MG Tabs tablet Commonly known as: ELIQUIS Take 1 tablet (5 mg total) by mouth 2 (two) times daily.   fluticasone 50 MCG/ACT nasal spray Commonly known as: FLONASE Place 2 sprays into both nostrils daily. What changed:  when to take this reasons to take this   furosemide 20 MG  tablet Commonly known as: LASIX Take 1 tablet (20 mg total) by mouth daily. What changed:  medication strength how much to take   levalbuterol 45 MCG/ACT inhaler Commonly known as: XOPENEX HFA Inhale 1 puff into the lungs every 4 (four) hours as needed for wheezing or shortness of breath.   metoprolol tartrate 25 MG tablet Commonly known as: LOPRESSOR Take 1 tablet (25 mg total) by mouth 2 (two) times daily.   MIRALAX PO Take 17 g by mouth daily as needed (constipation.).   multivitamin with minerals Tabs tablet Take 1 tablet by mouth in the morning. Centrum Silver for Men 50+   senna-docusate 8.6-50 MG tablet Commonly known as: Senokot-S Take 1 tablet by mouth at bedtime as needed for mild constipation or moderate constipation. What changed: when to take this   simvastatin 20 MG tablet Commonly known as: ZOCOR Take 1 tablet (20 mg total) by mouth daily at 6 PM.   spironolactone 50 MG tablet Commonly known as: ALDACTONE Take 1 tablet (50 mg total) by mouth daily.   tamsulosin 0.4 MG Caps capsule Commonly known as: FLOMAX Take 0.4 mg by mouth in the morning.               Durable Medical Equipment  (From admission, onward)           Start     Ordered   01/16/21 1153  For home use only DME oxygen  Once  Comments: Please assess for POC.  Question Answer Comment  Length of Need 12 Months   Mode or (Route) Nasal cannula   Liters per Minute 2   Frequency Continuous (stationary and portable oxygen unit needed)   Oxygen conserving device Yes   Oxygen delivery system Gas      01/16/21 1152   01/16/21 1128  For home use only DME lightweight manual wheelchair with seat cushion  Once       Comments: Patient suffers from generalized debility which impairs their ability to perform daily activities like ambulating in the home.  A cane or walker will not resolve  issue with performing activities of daily living. A wheelchair will allow patient to safely perform  daily activities. Patient is not able to propel themselves in the home using a standard weight wheelchair due to generalized debility and weakness. Patient can self propel in the lightweight wheelchair. Length of need 12 months Accessories: elevating leg rests (ELRs), wheel locks, extensions and anti-tippers.  Need transport chair, will be powered by the caregiver.   01/16/21 1128             Brief H and P: For complete details please refer to admission H and P, but in brief 87 year old male with a history of NSCLC left upper lobe, completed XRT, recurrent right-sided loculated hydropneumothorax, underwent outpatient left thoracentesis for rec pleural effusion per PCCM on 8/19 with removal of 500 cc.  Patient was admitted for acute hypoxic respiratory failure secondary to recurrent increasing loculated pleural effusion.    Hospital Course:   New worsening left pleural effusion, acute hypoxic respiratory failure loculated right lateral pleural effusion, recurrent -Seen on CT 8/22, underwent left thoracentesis on 8/23 (also underwent thoracentesis right on 8/24). Removed 1.4 L of blood-tinged fluid from left thoracentesis -- Unclear etiology for the recurrent pleural effusions, transudative (patient has NSCLC malignant neoplasm of the LUL lung, completed XRT 11/23/2020, radonc Dr. Lisbeth Renshaw, primary pulmonologist, Dr. [name]) - Cytology 8/23 showed reactive mesothelial cells, numerous lymphoid cells -Cardiology and gastroenterology consulted to ascertain etiology of these recurrent pleural effusions  - Per cardiology, pleural effusions not from cardiac source.   -Per GI, Dr. Rush Landmark, does not appear cirrhosis is the cause for bilateral pleural effusion, no ascites, no portal hypertension LFTs normal, INR normal, no thrombocytopenia.  There could be concern for idiopathic cryptogenic cirrhosis however rare to have only pleural effusion without any underlying liver failure. Recommended trial of  increasing diuretics.  -Patient was placed on IV Lasix, 20 mg every 12 hours -Patient's daughter had requested transfer to tertiary care, was declined by [facility]  and [place]. -Patient was seen by pulmonology prior to discharge, outpatient referral to [facility] pulmonology sent -Home O2 evaluation done, qualifies for 2 L O2 with ambulation     Loculated right lateral pleural effusion, recurrent -Since April/May 2022, nondiagnostic cytology in 08/2020 and 01/05/2021, loculated and persistent -Underwent thoracentesis right lung on 8/24, 520 cc removed -Pulmonology following, appreciate recommendations    Cirrhosis, noted on RUQ [place]: New diagnosis -2D echo showed EF of 65 to 70%, aortic stenosis.  Previously has been recommended TEE, doubt able to do TEE with his respiratory status -24-hour urine protein normal.  GI was consulted. -US showed no ascites, patent main portal vein, no portal hypertension,  -Per GI recommendations, placed on Lasix 20 mg daily, spironolactone 50 mg daily upon discharge     Possible aortic stenosis on echo 01/09/2021 -Previously has been recommended TEE -Patient  followed by Dr. Percival Spanish, office note on 11/03/2020 had noted moderate aortic stenosis and repeat echo in 1 year.  No acute concerns were noted. -patient was seen by cardiology: no acute issues, bilateral pleural effusions are likely not from cardiac source     Persistent atrial fibrillation -Heart rate controlled, continue metoprolol 25 twice daily -Eliquis resumed   BPH -Continue tamsulosin   History of NSCLC -Outpatient follow-up with pulmonology, radiation oncology, oncology   Essential hypertension -Continue beta-blocker, Lasix   Hyperlipidemia -Continue statin  Day of Discharge S: No acute issues overnight.  Patient's daughter and son at the bedside.  BP 119/72 (BP Location: Left Arm)   Pulse (!) 106   Temp 97.8 F (36.6 C) (Oral)   Resp 18   Ht 5\' 10"  (1.778 m)   Wt 72.6  kg   SpO2 97%   BMI 22.96 kg/m   Physical Exam: General: Alert and awake oriented x3 not in any acute distress. CVS: S1-S2 clear no murmur rubs or gallops Chest: Diminished breath sound at the bases Abdomen: soft nontender, nondistended, normal bowel sounds Extremities: no cyanosis, clubbing or edema noted bilaterally Neuro: no new deficits    Get Medicines reviewed and adjusted: Please take all your medications with you for your next visit with your Primary MD  Please request your Primary MD to go over all hospital tests and procedure/radiological results at the follow up. Please ask your Primary MD to get all Hospital records sent to his/her office.  If you experience worsening of your admission symptoms, develop shortness of breath, life threatening emergency, suicidal or homicidal thoughts you must seek medical attention immediately by calling 911 or calling your MD immediately  if symptoms less severe.  You must read complete instructions/literature along with all the possible adverse reactions/side effects for all the Medicines you take and that have been prescribed to you. Take any new Medicines after you have completely understood and accept all the possible adverse reactions/side effects.   Do not drive when taking pain medications.   Do not take more than prescribed Pain, Sleep and Anxiety Medications  Special Instructions: If you have smoked or chewed Tobacco  in the last 2 yrs please stop smoking, stop any regular Alcohol  and or any Recreational drug use.  Wear Seat belts while driving.  Please note  You were cared for by a hospitalist during your hospital stay. Once you are discharged, your primary care physician will handle any further medical issues. Please note that NO REFILLS for any discharge medications will be authorized once you are discharged, as it is imperative that you return to your primary care physician (or establish a relationship with a primary care  physician if you do not have one) for your aftercare needs so that they can reassess your need for medications and monitor your lab values.   The results of significant diagnostics from this hospitalization (including imaging, microbiology, ancillary and laboratory) are listed below for reference.      Procedures/Studies:  DG Chest 1 View  Result Date: 01/10/2021 CLINICAL DATA:  Status post right thoracentesis. EXAM: CHEST  1 VIEW COMPARISON:  01/09/2021 FINDINGS: Large loculated right pleural effusion is again identified. There has been slight decrease in volume of the right pleural effusion with mildly improved aeration to the right mid lung. No definite pneumothorax identified. Small left pleural effusion appears unchanged. IMPRESSION: 1. Slight decrease in volume of large loculated right pleural effusion status post thoracentesis. 2. No pneumothorax. Electronically Signed  By: Kerby Moors M.D.   On: 01/10/2021 13:02   DG Chest 1 View  Result Date: 01/09/2021 CLINICAL DATA:  Status post left thoracentesis EXAM: CHEST  1 VIEW COMPARISON:  01/08/2021 FINDINGS: Single frontal view of the chest demonstrates near complete resolution of the left pleural effusion after interval left-sided thoracentesis. No evidence of left-sided pneumothorax. Minimal consolidation at the left lung base consistent with residual atelectasis. There is persistent loculated right pleural effusion consistent with known empyema. Multifocal consolidation throughout the right lung is stable. Cardiac silhouette is unchanged. No acute bony abnormalities. IMPRESSION: 1. No complication after left-sided thoracentesis. Near complete resolution of left pleural effusion. 2. Stable known right empyema and underlying right lung consolidation. Electronically Signed   By: Randa Ngo M.D.   On: 01/09/2021 15:57   DG Chest 2 View  Result Date: 01/08/2021 CLINICAL DATA:  Increased shortness of breath EXAM: CHEST - 2 VIEW COMPARISON:   Chest x-ray dated January 05, 2021 FINDINGS: Visualized cardiac and mediastinal contours are unchanged. Increased size of moderate to large loculated right pleural effusion. Persistent right lung consolidation. Small left pleural effusion. No pneumothorax. IMPRESSION: Increased size of moderate to large loculated right pleural effusion when compared with most recent prior x-ray. Increased size of small left pleural effusion. Electronically Signed   By: Yetta Glassman M.D.   On: 01/08/2021 13:56   DG Chest 2 View  Result Date: 01/02/2021 CLINICAL DATA:  Dyspnea. EXAM: CHEST - 2 VIEW COMPARISON:  November 27, 2020. FINDINGS: Stable cardiomediastinal silhouette. No definite pneumothorax is noted. Stable large probable loculated right pleural effusion is noted extending from the apex to the base along the lateral wall. Atelectasis or infiltrates are noted in the aerated portions of the right lung. Minimal left basilar subsegmental atelectasis is noted. Bony thorax is unremarkable. IMPRESSION: Stable large loculated right pleural effusion is noted. Atelectasis or infiltrates are noted in the aerated portions of the right lung. Minimal left basilar subsegmental atelectasis. Electronically Signed   By: Marijo Conception M.D.   On: 01/02/2021 16:53   CT CHEST WO CONTRAST  Result Date: 01/08/2021 CLINICAL DATA:  Non-small cell lung cancer. Status post radiotherapy. Recurrent right-sided loculated hydropneumothorax. EXAM: CT CHEST WITHOUT CONTRAST TECHNIQUE: Multidetector CT imaging of the chest was performed following the standard protocol without IV contrast. COMPARISON:  10/02/2020 FINDINGS: Cardiovascular: Moderate cardiac enlargement. No pericardial effusion identified. Aortic atherosclerosis. Coronary artery calcifications. Mediastinum/Nodes: Normal appearance of the thyroid gland. The trachea appears patent and is midline. Normal appearance of the esophagus. No enlarged axillary lymph nodes. Multiple prominent  mediastinal lymph nodes are again identified. None of these meet CT criteria for adenopathy. Index nodes include: -left pre-vascular lymph node measures 1.1 cm, image 58/2. Unchanged from previous exam. Right paratracheal lymph node measures 1.1 cm, image 46/2. Previously 0.8 cm. Hilar lymph nodes are suboptimally evaluated due to lack of IV contrast material. Lungs/Pleura: Moderate loculated right pleural effusion is again noted and does not appear significantly improved in volume when compared with 10/02/2020. Decrease gas within this fluid collection noted compared with 10/02/2020. Significantly diminished aeration to the right lung. Overlying areas of pleural thickening and presumed rounded atelectasis noted within the right middle lobe and right lower lobe. Multiple areas of subsegmental atelectasis also noted within the aerated portions of the right lung. Moderate to large left pleural effusion has increased in volume from the previous exam. Treated tumor within the anteromedial left upper lobe measures 1.7 cm, image 39/5. Previously 1.8 cm. Patchy  areas of ground-glass and airspace density within the anterior left upper lobe is nonspecific, but favored to represent sequelae of external beam radiation. New atelectasis of the inferior lingula, image 114/5. Upper Abdomen: No acute findings within the imaged portions of the upper abdomen. Aortic atherosclerosis noted. Musculoskeletal: Spondylosis within the thoracic spine. No acute or significant osseous findings. IMPRESSION: 1. Stable appearance of treated tumor within the anteromedial left upper lobe. Adjacent area of ground-glass and airspace density is favored to represent sequelae of external beam radiation. 2. Large left pleural effusion has significantly increased in volume when compared with 10/02/2020. 3. No significant change in volume of loculated right pleural effusion, previously characterized as empyema. Volume of gas within the right pleural fluid  collection has decreased in the interval. 4. As before there is significantly diminished aeration to the right lung. Similar to previous exam is diffuse pleural thickening overlying the loculated right pleural fluid collection with areas of presumed rounded atelectasis as well as segmental and subsegmental atelectasis. 5. Aortic atherosclerosis and coronary artery calcifications. Aortic Atherosclerosis (ICD10-I70.0). Electronically Signed   By: Kerby Moors M.D.   On: 01/08/2021 19:01   DG Chest Port 1 View  Result Date: 01/14/2021 CLINICAL DATA:  Wheezing. EXAM: PORTABLE CHEST 1 VIEW COMPARISON:  January 10, 2021. FINDINGS: Stable cardiomediastinal silhouette. Stable large loculated right pleural effusion is noted with associated atelectasis. Increased left pleural effusion is noted with associated left basilar atelectasis or infiltrate. Bony thorax is unremarkable. IMPRESSION: Stable large loculated right pleural effusion with associated atelectasis. Mildly increased left pleural effusion is noted with associated atelectasis or infiltrate. Electronically Signed   By: Marijo Conception M.D.   On: 01/14/2021 13:32   DG CHEST PORT 1 VIEW  Result Date: 01/05/2021 CLINICAL DATA:  Right thoracentesis, short of breath EXAM: PORTABLE CHEST 1 VIEW COMPARISON:  01/02/2021, 10/02/2020 FINDINGS: Single frontal view of the chest demonstrates slight decrease in the right pleural effusion seen previously. Underlying right lung consolidation and pleural thickening again noted and unchanged. No evidence of postprocedural pneumothorax. Fiduciary marker identified overlying the left anterior first rib at the site of the known left upper lobe pulmonary nodule. Trace left pleural effusion versus pleural thickening identified. There are no acute bony abnormalities. Cardiac silhouette is stable. IMPRESSION: 1. Slight decrease in loculated right pleural fluid after thoracentesis. No evidence of postprocedural complication. 2.  Persistent right lung consolidation and pleural thickening compatible with previous history of empyema. 3. Fiduciary marker left upper lobe compatible with known pulmonary nodule. Electronically Signed   By: Randa Ngo M.D.   On: 01/05/2021 15:49   ECHOCARDIOGRAM COMPLETE  Result Date: 01/09/2021    ECHOCARDIOGRAM REPORT   Patient Name:   KIRAN CARLINE Date of Exam: 01/09/2021 Medical Rec #:  [id number]     Height:       70.0 in Accession #:    [id number]    Weight:       160.0 lb Date of Birth:  July 11, 1929     BSA:          1.898 m Patient Age:    89 years      BP:           118/68 mmHg Patient Gender: M             HR:           82 bpm. Exam Location:  Inpatient Procedure: 2D Echo, Cardiac Doppler and Color Doppler Indications:    Cardiomyopathy-Unspecified I42.9  History:        Patient has prior history of Echocardiogram examinations, most                 recent 09/20/2020. Aortic Valve Disease; Arrythmias:Atrial                 Fibrillation. Non-small cell lung cancer. Right pneumothorax.                 Acute hypoxemic respiratory failure.  Sonographer:    Darlina Sicilian RDCS Referring Phys: [phone] [facility]  Sonographer Comments: Suboptimal parasternal window, suboptimal apical window and suboptimal subcostal window. Image acquisition challenging due to respiratory motion. IMPRESSIONS  1. Largely nondiagnostic study due to poor acoustic windows for imaging. Limited findings as noted below.  2. Left ventricular ejection fraction, by estimation, is 65 to 70%. The left ventricle has normal function. Left ventricular diastolic parameters are indeterminate.  3. Large pleural effusion.  4. The mitral valve is degenerative.  5. The aortic valve is abnormal. FINDINGS  Left Ventricle: Left ventricular ejection fraction, by estimation, is 65 to 70%. The left ventricle has normal function. Definity contrast agent was given IV to delineate the left ventricular endocardial borders. Left ventricular diastolic  parameters are indeterminate. Mitral Valve: The mitral valve is degenerative in appearance. Aortic Valve: The aortic valve is abnormal. Additional Comments: There is a large pleural effusion.  LEFT VENTRICLE PLAX 2D LVOT diam:     1.60 cm LVOT Area:     2.01 cm   AORTA Ao Root diam: 3.00 cm MITRAL VALVE MV Area (PHT): 4.30 cm     SHUNTS MV Decel Time: 176 msec     Systemic Diam: 1.60 cm MV E velocity: 109.33 cm/s Cherlynn Kaiser MD Electronically signed by Cherlynn Kaiser MD Signature Date/Time: 01/09/2021/5:29:15 PM    Final    VAS [place] LOWER EXTREMITY VENOUS (DVT)  Result Date: 01/10/2021  Lower Venous DVT Study Patient Name:  BREN STEERS  Date of Exam:   01/10/2021 Medical Rec #: [id number]      Accession #:    [id number] Date of Birth: Jul 14, 1929      Patient Gender: M Patient Age:   53 years Exam Location:  [facility] Procedure:      VAS [place] LOWER EXTREMITY VENOUS (DVT) Referring Phys: MURALI RAMASWAMY --------------------------------------------------------------------------------  Indications: Edema.  Risk Factors: None identified. Comparison Study: No prior studies. Performing Technologist: Oliver Hum RVT  Examination Guidelines: A complete evaluation includes B-mode imaging, spectral Doppler, color Doppler, and power Doppler as needed of all accessible portions of each vessel. Bilateral testing is considered an integral part of a complete examination. Limited examinations for reoccurring indications may be performed as noted. The reflux portion of the exam is performed with the patient in reverse Trendelenburg.  +---------+---------------+---------+-----------+----------+--------------+ RIGHT    CompressibilityPhasicitySpontaneityPropertiesThrombus Aging +---------+---------------+---------+-----------+----------+--------------+ CFV      Full           Yes      Yes                                 +---------+---------------+---------+-----------+----------+--------------+ SFJ       Full                                                        +---------+---------------+---------+-----------+----------+--------------+  FV Prox  Full                                                        +---------+---------------+---------+-----------+----------+--------------+ FV Mid   Full                                                        +---------+---------------+---------+-----------+----------+--------------+ FV DistalFull                                                        +---------+---------------+---------+-----------+----------+--------------+ PFV      Full                                                        +---------+---------------+---------+-----------+----------+--------------+ POP      Full           Yes      Yes                                 +---------+---------------+---------+-----------+----------+--------------+ PTV      Full                                                        +---------+---------------+---------+-----------+----------+--------------+ PERO     Full                                                        +---------+---------------+---------+-----------+----------+--------------+   +---------+---------------+---------+-----------+----------+--------------+ LEFT     CompressibilityPhasicitySpontaneityPropertiesThrombus Aging +---------+---------------+---------+-----------+----------+--------------+ CFV      Full           Yes      Yes                                 +---------+---------------+---------+-----------+----------+--------------+ SFJ      Full                                                        +---------+---------------+---------+-----------+----------+--------------+ FV Prox  Full                                                        +---------+---------------+---------+-----------+----------+--------------+  FV Mid   Full                                                         +---------+---------------+---------+-----------+----------+--------------+ FV DistalFull                                                        +---------+---------------+---------+-----------+----------+--------------+ PFV      Full                                                        +---------+---------------+---------+-----------+----------+--------------+ POP      Full           Yes      Yes                                 +---------+---------------+---------+-----------+----------+--------------+ PTV      Full                                                        +---------+---------------+---------+-----------+----------+--------------+ PERO     Full                                                        +---------+---------------+---------+-----------+----------+--------------+     Summary: RIGHT: - There is no evidence of deep vein thrombosis in the lower extremity.  - No cystic structure found in the popliteal fossa.  LEFT: - There is no evidence of deep vein thrombosis in the lower extremity.  - No cystic structure found in the popliteal fossa.  *See table(s) above for measurements and observations. Electronically signed by Harold Barban MD on 01/10/2021 at 8:06:44 PM.    Final    US Abdomen Limited RUQ (LIVER/GB)  Result Date: 01/08/2021 CLINICAL DATA:  Right pleural effusion and suspected cirrhosis. EXAM: ULTRASOUND ABDOMEN LIMITED RIGHT UPPER QUADRANT COMPARISON:  None. FINDINGS: Gallbladder: No gallstones or wall thickening visualized. No sonographic Murphy sign noted by sonographer. Common bile duct: Diameter: 4 mm Liver: There is slight heterogeneity of the liver echogenicity with coarsened echotexture and mild surface irregularity, consistent with changes of cirrhosis. No discrete mass identified. Portal vein is patent on color Doppler imaging with normal direction of blood flow towards the liver. Other: Partially visualized right  pleural effusion. IMPRESSION: 1. Cirrhosis. 2. Patent main portal vein with hepatopetal flow. 3. No ascites. 4. Right pleural effusion. Electronically Signed   By: Anner Crete M.D.   On: 01/08/2021 19:51   US THORACENTESIS ASP PLEURAL SPACE W/IMG GUIDE  Result Date: 01/10/2021 INDICATION: Patient with history of lung and prostate cancers, bilateral pleural effusions,  cirrhosis by imaging; request received for diagnostic and therapeutic right thoracentesis. EXAM: ULTRASOUND GUIDED DIAGNOSTIC AND THERAPEUTIC RIGHT THORACENTESIS MEDICATIONS: 1% lidocaine to skin and subcutaneous tissue COMPLICATIONS: None immediate. PROCEDURE: An ultrasound guided thoracentesis was thoroughly discussed with the patient/daughter and questions answered. The benefits, risks, alternatives and complications were also discussed. The patient understands and wishes to proceed with the procedure. Written consent was obtained. Ultrasound was performed to localize and mark an adequate pocket of fluid in the right chest. The area was then prepped and draped in the normal sterile fashion. 1% Lidocaine was used for local anesthesia. Under ultrasound guidance a 6 Fr Safe-T-Centesis catheter was introduced. Thoracentesis was performed. The catheter was removed and a dressing applied. FINDINGS: A total of approximately 520 cc of amber/blood-tinged fluid was removed. Samples were sent to the laboratory as requested by the clinical team. Due to patient chest discomfort only the above amount of fluid was removed today. IMPRESSION: Successful ultrasound guided diagnostic and therapeutic right thoracentesis yielding 520 cc of pleural fluid. Read by: Rowe Robert, PA-C Electronically Signed   By: Markus Daft M.D.   On: 01/10/2021 12:41   US THORACENTESIS ASP PLEURAL SPACE W/IMG GUIDE  Result Date: 01/10/2021 INDICATION: Patient with history of non-small cell lung cancer, left pleural effusion; request for left thoracentesis EXAM: ULTRASOUND  GUIDED DIAGNOSTIC AND THERAPEUTIC LEFT THORACENTESIS MEDICATIONS: 5 ML 1% LIDOCAINE COMPLICATIONS: None immediate. PROCEDURE: An ultrasound guided thoracentesis was thoroughly discussed with the patient and questions answered. The benefits, risks, alternatives and complications were also discussed. The patient understands and wishes to proceed with the procedure. Written consent was obtained. Ultrasound was performed to localize and mark an adequate pocket of fluid in the left chest. The area was then prepped and draped in the normal sterile fashion. 1% Lidocaine was used for local anesthesia. Under ultrasound guidance a 6 Fr Safe-T-Centesis catheter was introduced. Thoracentesis was performed. The catheter was removed and a dressing applied. FINDINGS: A total of approximately 1.4 L of blood-tinged, amber fluid was removed. Samples were sent to the laboratory as requested by the clinical team. IMPRESSION: Successful ultrasound guided diagnostic and therapeutic left thoracentesis yielding 1.4 L of pleural fluid. Read by: Narda Rutherford, NP Electronically Signed   By: Lucrezia Europe M.D.   On: 01/09/2021 15:40      LAB RESULTS: Basic Metabolic Panel: Recent Labs  Lab 01/13/21 0620 01/15/21 0459  NA 140 136  K 4.4 4.7  CL 94* 88*  CO2 43* 41*  GLUCOSE 131* 173*  BUN 30* 24*  CREATININE 0.80 0.80  CALCIUM 8.7* 9.1   Liver Function Tests: No results for input(s): AST, ALT, ALKPHOS, BILITOT, PROT, ALBUMIN in the last 168 hours. No results for input(s): LIPASE, AMYLASE in the last 168 hours. No results for input(s): AMMONIA in the last 168 hours. CBC: Recent Labs  Lab 01/13/21 0620 01/15/21 0459  WBC 5.1 5.5  HGB 10.9* 11.3*  HCT 37.8* 38.0*  MCV 86.9 85.8  PLT 174 188   Cardiac Enzymes: No results for input(s): CKTOTAL, CKMB, CKMBINDEX, TROPONINI in the last 168 hours. BNP: Invalid input(s): POCBNP CBG: No results for input(s): GLUCAP in the last 168 hours.     Disposition and  Follow-up: Discharge Instructions     Ambulatory referral to Pulmonology   Complete by: As directed    Referral to [facility] for recurrent loculated R pleural effusion   Reason for referral: Pleural Effusion   Diet - low sodium heart healthy   Complete  by: As directed    Increase activity slowly   Complete by: As directed    No wound care   Complete by: As directed         DISPOSITION: Home with home health, PT, RN, [place], [place] Follow up.   Specialty: Home Health Services Why: This is your home health agency that will be providing physical therapy. Contact information: [place] [address] [phone]         [name], MD. Schedule an appointment as soon as possible for a visit in 1 week(s).   Specialty: Internal Medicine Why: for hospital follow-up, obtain labs on Monday, 11/25/37 for metabolic panel Contact information: [place] [address] [phone]         Minus Breeding, MD. Schedule an appointment as soon as possible for a visit in 2 week(s).   Specialty: Cardiology Contact information: [address] [place] [address] [phone]         [facility] [place] Follow up.   Why: This is your oxygen company.  They are actually out of [place].  Their number is [phone] if you need to reach out to them. Contact information: [place] [address] [phone]                  Time coordinating discharge:  40 minutes  Signed:   Estill Cotta M.D. [facility] 01/16/2021, 1:17 PM

## 2021-01-16 NOTE — Progress Notes (Signed)
Went over discharge instructions w/ pt and daughter.

## 2021-01-17 ENCOUNTER — Telehealth: Payer: Self-pay | Admitting: Internal Medicine

## 2021-01-17 MED ORDER — LEVALBUTEROL TARTRATE 45 MCG/ACT IN AERO: 1.0000 | INHALATION_SPRAY | RESPIRATORY_TRACT | 2 refills | Status: AC | PRN

## 2021-01-17 NOTE — Telephone Encounter (Signed)
Sent in rx for this

## 2021-01-17 NOTE — Telephone Encounter (Signed)
See below

## 2021-01-17 NOTE — Telephone Encounter (Signed)
Patient has been discharged from hospital  Daughter says hospital provider sent rx for levalbuterol inhaler to pharmacy but pharmacy advised her that it could not be sent from hospital provider it had to be sent from patient pcp  Wanting to know if rx can be sent to pharmacy for patient  Pharmacy: [place], [place]  Phone:  [phone] Fax:  [phone]

## 2021-01-18 DIAGNOSIS — Z923 Personal history of irradiation: Secondary | ICD-10-CM | POA: Diagnosis not present

## 2021-01-18 DIAGNOSIS — K7460 Unspecified cirrhosis of liver: Secondary | ICD-10-CM | POA: Diagnosis not present

## 2021-01-18 DIAGNOSIS — J9601 Acute respiratory failure with hypoxia: Secondary | ICD-10-CM | POA: Diagnosis not present

## 2021-01-18 DIAGNOSIS — J869 Pyothorax without fistula: Secondary | ICD-10-CM | POA: Diagnosis not present

## 2021-01-18 DIAGNOSIS — Z87891 Personal history of nicotine dependence: Secondary | ICD-10-CM | POA: Diagnosis not present

## 2021-01-18 DIAGNOSIS — Z8546 Personal history of malignant neoplasm of prostate: Secondary | ICD-10-CM | POA: Diagnosis not present

## 2021-01-18 DIAGNOSIS — I4819 Other persistent atrial fibrillation: Secondary | ICD-10-CM | POA: Diagnosis not present

## 2021-01-18 DIAGNOSIS — Z9981 Dependence on supplemental oxygen: Secondary | ICD-10-CM | POA: Diagnosis not present

## 2021-01-18 DIAGNOSIS — I10 Essential (primary) hypertension: Secondary | ICD-10-CM | POA: Diagnosis not present

## 2021-01-18 DIAGNOSIS — J948 Other specified pleural conditions: Secondary | ICD-10-CM | POA: Diagnosis not present

## 2021-01-18 DIAGNOSIS — I350 Nonrheumatic aortic (valve) stenosis: Secondary | ICD-10-CM | POA: Diagnosis not present

## 2021-01-18 DIAGNOSIS — J90 Pleural effusion, not elsewhere classified: Secondary | ICD-10-CM | POA: Diagnosis not present

## 2021-01-18 DIAGNOSIS — L89322 Pressure ulcer of left buttock, stage 2: Secondary | ICD-10-CM | POA: Diagnosis not present

## 2021-01-18 DIAGNOSIS — Z9181 History of falling: Secondary | ICD-10-CM | POA: Diagnosis not present

## 2021-01-18 DIAGNOSIS — C3412 Malignant neoplasm of upper lobe, left bronchus or lung: Secondary | ICD-10-CM | POA: Diagnosis not present

## 2021-01-18 DIAGNOSIS — E119 Type 2 diabetes mellitus without complications: Secondary | ICD-10-CM | POA: Diagnosis not present

## 2021-01-18 DIAGNOSIS — Z7901 Long term (current) use of anticoagulants: Secondary | ICD-10-CM | POA: Diagnosis not present

## 2021-01-18 DIAGNOSIS — N400 Enlarged prostate without lower urinary tract symptoms: Secondary | ICD-10-CM | POA: Diagnosis not present

## 2021-01-18 DIAGNOSIS — E785 Hyperlipidemia, unspecified: Secondary | ICD-10-CM | POA: Diagnosis not present

## 2021-01-18 DIAGNOSIS — R911 Solitary pulmonary nodule: Secondary | ICD-10-CM | POA: Diagnosis not present

## 2021-01-18 LAB — ANTINUCLEAR ANTIBODIES, IFA: ANA Ab, IFA: NEGATIVE

## 2021-01-18 LAB — MISC LABCORP TEST (SEND OUT): Labcorp test code: 9985

## 2021-01-19 ENCOUNTER — Telehealth: Payer: Self-pay | Admitting: Internal Medicine

## 2021-01-19 NOTE — Telephone Encounter (Signed)
Verbal orders given  

## 2021-01-19 NOTE — Telephone Encounter (Signed)
Fine

## 2021-01-19 NOTE — Telephone Encounter (Signed)
See below

## 2021-01-19 NOTE — H&P (Signed)
History and Physical  ALEC JAROS TRV:[id number] DOB: 30-Jan-1930 DOA: 01/05/2021  PCP: [name], MD   Chief Complaint: SOB  HPI:  87 year old man with hx of aortic stenosis, prior left lung cancer post XRT presenting for recurrent dyspnea and known loculated right pleural effusion.  Here for thoracentesis hopefully for symptomatic relief.  Review of Systems:  14 point ROS performed positive for weakness, DOE, orthopnea, and LE edema.  PMH Left upper lobe lung cancer status post XRT  Atrial fibrillation Moderate aortic stenosis  Prostate cancer Previous diagnoses of hypertension and diabetes mellitus type 2, off medications Remainder reviewed in Epic  PSH Prior thoracentesis Bronchoscopy Inguinal hernia repair Radioactive seed implant prostate Remainder reviewed in Epic  Family history includes: Daughter with atrial fibrillation Remainder reviewed in Epic  Social History Quit smoking in the 60s.  No alcohol or drugs.  Widowed.  Daughter takes care of him.  Allergies Lisinopril   Medications Current Outpatient Medications  Medication Instructions   apixaban (ELIQUIS) 5 mg, Oral, 2 times daily   fluticasone (FLONASE) 50 MCG/ACT nasal spray 2 sprays, Each Nare, Daily   furosemide (LASIX) 20 mg, Oral, Daily   levalbuterol (XOPENEX HFA) 45 MCG/ACT inhaler 1 puff, Inhalation, Every 4 hours PRN   metoprolol tartrate (LOPRESSOR) 25 mg, Oral, 2 times daily   Multiple Vitamin (MULTIVITAMIN WITH MINERALS) TABS tablet 1 tablet, Oral, Every morning, Centrum Silver for Men 50+   Polyethylene Glycol 3350 (MIRALAX PO) 17 g, Oral, Daily PRN   senna-docusate (SENOKOT-S) 8.6-50 MG tablet 1 tablet, Oral, At bedtime PRN   simvastatin (ZOCOR) 20 mg, Oral, Daily-1800   spironolactone (ALDACTONE) 50 mg, Oral, Daily   tamsulosin (FLOMAX) 0.4 mg, Oral, Every morning    Physicial Exam  Constitutional: no distress  Eyes: EOMI, pupils equal Ears, nose, mouth, and throat:  trachea midline Cardiovascular: RRR, ext warm Respiratory: Diminished on R, large effusion on [place] Gastrointestinal: soft, +BS Skin: No rashes, normal turgor Neurologic: moves all 4 ext to command Psychiatric: Aox3   Imaging studies:  Chest x-ray increasing right-sided loculated pleural effusion independently reviewed    ASSESSMENT/PLAN  Recurrent R pleural effusion with neg cytology- unclear etiology but 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 - Proceed with diagnostic/therapeutic R thoracentesis - He was instructed to call Monday to let us know if feeling better/worse  Erskine Emery MD PCCM

## 2021-01-19 NOTE — Telephone Encounter (Signed)
Jolinda calling in from [place]  Requesting to add PT to patients plan of care

## 2021-01-20 LAB — ACID FAST SMEAR (AFB, MYCOBACTERIA): Acid Fast Smear: NEGATIVE

## 2021-01-22 DIAGNOSIS — L89322 Pressure ulcer of left buttock, stage 2: Secondary | ICD-10-CM | POA: Diagnosis not present

## 2021-01-22 DIAGNOSIS — J948 Other specified pleural conditions: Secondary | ICD-10-CM | POA: Diagnosis not present

## 2021-01-22 DIAGNOSIS — K7460 Unspecified cirrhosis of liver: Secondary | ICD-10-CM | POA: Diagnosis not present

## 2021-01-22 DIAGNOSIS — J9601 Acute respiratory failure with hypoxia: Secondary | ICD-10-CM | POA: Diagnosis not present

## 2021-01-22 DIAGNOSIS — C3412 Malignant neoplasm of upper lobe, left bronchus or lung: Secondary | ICD-10-CM | POA: Diagnosis not present

## 2021-01-22 DIAGNOSIS — J90 Pleural effusion, not elsewhere classified: Secondary | ICD-10-CM | POA: Diagnosis not present

## 2021-01-22 DIAGNOSIS — J869 Pyothorax without fistula: Secondary | ICD-10-CM | POA: Diagnosis not present

## 2021-01-23 ENCOUNTER — Ambulatory Visit: Payer: Medicare Other | Admitting: Internal Medicine

## 2021-01-23 ENCOUNTER — Ambulatory Visit: Payer: Medicare Other | Admitting: General Practice

## 2021-01-23 DIAGNOSIS — L89322 Pressure ulcer of left buttock, stage 2: Secondary | ICD-10-CM | POA: Diagnosis not present

## 2021-01-23 DIAGNOSIS — J869 Pyothorax without fistula: Secondary | ICD-10-CM | POA: Diagnosis not present

## 2021-01-23 DIAGNOSIS — K7460 Unspecified cirrhosis of liver: Secondary | ICD-10-CM | POA: Diagnosis not present

## 2021-01-23 DIAGNOSIS — J9601 Acute respiratory failure with hypoxia: Secondary | ICD-10-CM | POA: Diagnosis not present

## 2021-01-23 DIAGNOSIS — J948 Other specified pleural conditions: Secondary | ICD-10-CM | POA: Diagnosis not present

## 2021-01-23 DIAGNOSIS — J90 Pleural effusion, not elsewhere classified: Secondary | ICD-10-CM | POA: Diagnosis not present

## 2021-01-24 ENCOUNTER — Telehealth: Payer: Self-pay

## 2021-01-24 DIAGNOSIS — J90 Pleural effusion, not elsewhere classified: Secondary | ICD-10-CM | POA: Diagnosis not present

## 2021-01-24 DIAGNOSIS — K7460 Unspecified cirrhosis of liver: Secondary | ICD-10-CM | POA: Diagnosis not present

## 2021-01-24 DIAGNOSIS — I482 Chronic atrial fibrillation, unspecified: Secondary | ICD-10-CM | POA: Diagnosis not present

## 2021-01-24 DIAGNOSIS — J9611 Chronic respiratory failure with hypoxia: Secondary | ICD-10-CM | POA: Diagnosis not present

## 2021-01-24 DIAGNOSIS — I350 Nonrheumatic aortic (valve) stenosis: Secondary | ICD-10-CM | POA: Diagnosis not present

## 2021-01-24 DIAGNOSIS — C3412 Malignant neoplasm of upper lobe, left bronchus or lung: Secondary | ICD-10-CM | POA: Diagnosis not present

## 2021-01-24 NOTE — Chronic Care Management (AMB) (Signed)
Chronic Care Management Pharmacy Assistant   Name: Adam Briggs  MRN: [id number] DOB: 1930-03-27  Adam Briggs is an 87 y.o. year old male who presents for his initial CCM visit with the clinical pharmacist.   Recent office visits:  11/27/20 [name] MD - Seen for Shortness orf breath - Labs ordered - No medication changes noted - Follow up in 3-6 months 10/05/20  [name] MD - Seen for mass of upper lobe - labs ordered - No medication changes and no follow up noted 09/15/20 [name] MD - Seen for moderate aortic stenosis - labs ordered - No medication changes and no follow up noted  Recent consult visits:  11/03/20 Minus Breeding MD - Cardiology - Seen for persistent atrial fibrillation - echocardiogram ordered - No med changes noted - Follow up in 1 year.  10/31/20 Kyung Rudd MD - [facility] - Seen for consult - Notes not available  10/25/20 Octavio Graves Icard DO - Pulmonary care - Seen for lung nodule -No medication changes noted - Referral to radiation oncology - follow up in 3 months 10/05/20 Sherran Needs NP - Cardiology - Seen for new onset a-fib - No medication changes noted - Follow up on 6/17 10/02/20 Octavio Graves Icard DO - Pulmonary care - Seen for lung nodule - Ambulatory referral to Pulmonology for Bronchoscopy with endobronchial navigation, video bronchoscopy with endobronchial ultrasound - hold eliquis 2 full days prior to procedure. - follow up in 4 weeks   Hospital visits:  Medication Reconciliation was completed by comparing discharge summary, patient's EMR and Pharmacy list, and upon discussion with patient.  Admitted to the hospital on 01/08/21 due to Loculated right lateral pneumothorax. Discharge date was 01/16/21. Discharged from [place]?Medications Started at [facility] Discharge:?? -started levalbuterol 45 Mcg and sprinolactone 50 mg   Medication Changes at Hospital Discharge: -Changed furosemide 20 mg    Medications Discontinued at Hospital Discharge: -Stopped albuterol 108 Mcg  Medications that remain the same after Hospital Discharge:??  -All other medications will remain the same.      Admitted to the hospital on 09/15/20 due to pleural effusion. Discharge date was 09/28/20. Discharged from [place]?Medications Started at [facility] Discharge:?? -started  apixaban (ELIQUIS) 5 mg Take 1 tablet by mouth 2 times daily. furosemide (LASIX) 40 MG tablet 1 tab daily metoprolol tartrate (LOPRESSOR) Take 1 tablet (25 mg total) by mouth 2 times daily. senna-docusate (Senokot-S) 8.6-50 mg tablet take 1 tablet at bedtime prn   Medication Changes at Hospital Discharge: N/a  Medications Discontinued at Hospital Discharge: -Stopped  apixaban (ELIQUIS) furosemide (LASIX) metoprolol tartrate (LOPRESSOR) senna-docusate (Senokot-S)   Medications that remain the same after Hospital Discharge:??  -All other medications will remain the same.    Medications: Outpatient Encounter Medications as of 01/24/2021  Medication Sig   apixaban (ELIQUIS) 5 MG TABS tablet Take 1 tablet (5 mg total) by mouth 2 (two) times daily.   fluticasone (FLONASE) 50 MCG/ACT nasal spray Place 2 sprays into both nostrils daily. (Patient taking differently: Place 2 sprays into both nostrils daily as needed for allergies.)   furosemide (LASIX) 20 MG tablet Take 1 tablet (20 mg total) by mouth daily.   levalbuterol (XOPENEX HFA) 45 MCG/ACT inhaler Inhale 1 puff into the lungs every 4 (four) hours as needed for wheezing or shortness of breath.   metoprolol tartrate (LOPRESSOR) 25 MG tablet Take 1  tablet (25 mg total) by mouth 2 (two) times daily.   Multiple Vitamin (MULTIVITAMIN WITH MINERALS) TABS tablet Take 1 tablet by mouth in the morning. Centrum Silver for Men 50+   Polyethylene Glycol 3350 (MIRALAX PO) Take 17 g by mouth daily as needed (constipation.).   senna-docusate (SENOKOT-S) 8.6-50 MG tablet  Take 1 tablet by mouth at bedtime as needed for mild constipation or moderate constipation. (Patient taking differently: Take 1 tablet by mouth at bedtime.)   simvastatin (ZOCOR) 20 MG tablet Take 1 tablet (20 mg total) by mouth daily at 6 PM.   spironolactone (ALDACTONE) 50 MG tablet Take 1 tablet (50 mg total) by mouth daily.   tamsulosin (FLOMAX) 0.4 MG CAPS capsule Take 0.4 mg by mouth in the morning.   No facility-administered encounter medications on file as of 01/24/2021.    apixaban (ELIQUIS) 5 MG TABS tablet - Last filled 01/14/21 90 DS fluticasone (FLONASE) 50 MCG/ACT nasal spray - Last filled unknown furosemide (LASIX) 20 MG tablet - Last filled 01/14/21 90 DS levalbuterol (XOPENEX HFA) 45 MCG/ACT inhaler - Last filled 01/17/21 metoprolol tartrate (LOPRESSOR) 25 MG tablet - Last filled 01/14/21 90 DS senna-docusate (SENOKOT-S) 8.6-50 MG tablet - Last filled 11/22/20 90 DS simvastatin (ZOCOR) 20 MG tablet - Last filled 12/21/20 90 DS spironolactone (ALDACTONE) 50 MG tablet - Last filled 01/16/21 30 DS tamsulosin (FLOMAX) 0.4 MG CAPS capsule - Last filled 12/21/20 90 DS   Star Rating Drugs: simvastatin (ZOCOR) 20 MG tablet - Last filled 12/21/20 90 DS   Andee Poles, CMA

## 2021-01-25 ENCOUNTER — Other Ambulatory Visit: Payer: Self-pay

## 2021-01-25 ENCOUNTER — Ambulatory Visit (INDEPENDENT_AMBULATORY_CARE_PROVIDER_SITE_OTHER): Payer: Medicare Other

## 2021-01-25 ENCOUNTER — Ambulatory Visit: Payer: Medicare Other | Admitting: Pulmonary Disease

## 2021-01-25 DIAGNOSIS — M7989 Other specified soft tissue disorders: Secondary | ICD-10-CM

## 2021-01-25 DIAGNOSIS — I10 Essential (primary) hypertension: Secondary | ICD-10-CM

## 2021-01-25 DIAGNOSIS — K7460 Unspecified cirrhosis of liver: Secondary | ICD-10-CM | POA: Diagnosis not present

## 2021-01-25 DIAGNOSIS — J90 Pleural effusion, not elsewhere classified: Secondary | ICD-10-CM | POA: Diagnosis not present

## 2021-01-25 DIAGNOSIS — R0602 Shortness of breath: Secondary | ICD-10-CM

## 2021-01-25 DIAGNOSIS — L89322 Pressure ulcer of left buttock, stage 2: Secondary | ICD-10-CM | POA: Diagnosis not present

## 2021-01-25 DIAGNOSIS — J9601 Acute respiratory failure with hypoxia: Secondary | ICD-10-CM | POA: Diagnosis not present

## 2021-01-25 DIAGNOSIS — I4891 Unspecified atrial fibrillation: Secondary | ICD-10-CM

## 2021-01-25 DIAGNOSIS — J948 Other specified pleural conditions: Secondary | ICD-10-CM | POA: Diagnosis not present

## 2021-01-25 DIAGNOSIS — J869 Pyothorax without fistula: Secondary | ICD-10-CM | POA: Diagnosis not present

## 2021-01-25 NOTE — Progress Notes (Signed)
Chronic Care Management Pharmacy Note  01/25/2021 Name:  Adam Briggs MRN:  [id number] DOB:  [birth year]  Subjective: Adam Briggs is an 87 y.o. year old male who is a primary patient of [name], MD.  The CCM team was consulted for assistance with disease management and care coordination needs.   Summary: - Has not been able to start newest inhaler (Xopenex) as directed at latest hospital discharge due to insurance  -Has had appointment with [facility] - scheduled for CT and follow up in 2 weeks - still trying to determine source of fluid in lungs -started on spironolactone with latest hospital discharge and furosemide has been reduced to 12m daily - noted that patient reports swelling in ankles, has follow up with cardiology 01/29/21  Recommendations/Changes made from today's visit: - Will complete PA for xopenex inhaler  -Patient to continue all other medications, scheduled for PCP follow up in 1 week per daughter's request   Engaged with patient by telephone for follow up visit in response to provider referral for pharmacy case management and/or care coordination services.   Consent to Services:  The patient was given the following information about Chronic Care Management services today, agreed to services, and gave verbal consent: 1. CCM service includes personalized support from designated clinical staff supervised by the primary care provider, including individualized plan of care and coordination with other care providers 2. 24/7 contact phone numbers for assistance for urgent and routine care needs. 3. Service will only be billed when office clinical staff spend 20 minutes or more in a month to coordinate care. 4. Only one practitioner may furnish and bill the service in a calendar month. 5.The patient may stop CCM services at any time (effective at the end of the month) by phone call to the office staff. 6. The patient will be responsible for cost sharing  (co-pay) of up to 20% of the service fee (after annual deductible is met). Patient agreed to services and consent obtained.  Patient Care Team: CHoyt Koch MD as PCP - General (Internal Medicine) HMinus Breeding MD as PCP - Cardiology (Cardiology) ERenato Shin MD (Endocrinology) PIrene Shipper MD (Gastroenterology) TCarolan Clines MD (Inactive) (Urology) HMinus Breeding MD (Cardiology) SDelice BisonDDarnelle Maffucci [facility](Pharmacist) SDelice BisonDDarnelle Maffucci [facility] (Pharmacist)  Recent office visits:  11/27/20 EHoyt KochMD - Seen for Shortness orf breath - Labs ordered - No medication changes noted - Follow up in 3-6 months    [facility]:  [name] of [facility] effusions, no changes to medications noted at this time  11/03/20 JMinus BreedingMD - Cardiology - Seen for persistent atrial fibrillation - echocardiogram ordered - No med changes noted - Follow up in 1 year.  10/31/20 JKyung RuddMD - [facility] - Seen for consult - Notes not available  10/25/20 [name] DO - Pulmonary care - Seen for lung nodule -No medication changes noted - Referral to radiation oncology - follow up in 3 months   Hospital visits:  Medication Reconciliation was completed by comparing discharge summary, patient's EMR and Pharmacy list, and upon discussion with patient.   Admitted to the hospital on 01/08/21 due to acute hypoxic respiratory failure/ Loculated right lateral pneumothorax. Discharge date was 01/16/21[facility] -started levalbuterol 45 Mcg and sprinolactone 50 mg  Medication Changes at Hospital Discharge: -Changed furosemide 20 mg    Medications Discontinued at Hospital Discharge: -Stopped albuterol 108 Mcg   Medications that remain the same after Hospital Discharge:??  -All other medications will  remain the same.    01/05/2021 - to [facility] for thoracentesis   Objective:  Lab Results  Component Value Date   CREATININE 0.80 01/15/2021   BUN 24 (H) 01/15/2021   GFR 64.41 11/27/2020   GFRNONAA >60 01/15/2021   GFRAA 68 (L) 07/11/2014   NA 136 01/15/2021   K 4.7 01/15/2021   CALCIUM 9.1 01/15/2021   CO2 41 (H) 01/15/2021   GLUCOSE 173 (H) 01/15/2021    Lab Results  Component Value Date/Time   HGBA1C 6.2 09/15/2020 11:32 AM   HGBA1C 6.0 (H) 11/26/2019 03:49 PM   GFR 64.41 11/27/2020 11:15 AM   GFR 55.91 (L) 10/05/2020 10:51 AM   MICROALBUR 20.0 (H) 10/19/2013 10:59 AM   MICROALBUR 13.8 (H) 10/14/2012 10:28 AM    Last diabetic Eye exam:  Lab Results  Component Value Date/Time   HMDIABEYEEXA done - dr Christel Mormon - no DM retinopathy 02/17/2013 12:00 AM    Last diabetic Foot exam:  No results found for: HMDIABFOOTEX   Lab Results  Component Value Date   CHOL 117 09/19/2020   HDL 36 (L) 09/19/2020   LDLCALC 72 09/19/2020   LDLDIRECT 110.2 12/28/2007   TRIG 43 09/19/2020   CHOLHDL 3.3 09/19/2020    Hepatic Function Latest Ref Rng & Units 01/08/2021 11/27/2020 10/05/2020  Total Protein 6.5 - 8.1 g/dL 6.7 6.2 6.3  Albumin 3.5 - 5.0 g/dL 3.4(L) 3.6 3.7  AST 15 - 41 U/L '15 12 12  ' ALT 0 - 44 U/L '16 11 13  ' Alk Phosphatase 38 - 126 U/L 57 56 57  Total Bilirubin 0.3 - 1.2 mg/dL 0.8 0.6 0.7  Bilirubin, Direct 0.0 - 0.3 mg/dL - - -    Lab Results  Component Value Date/Time   TSH 3.642 09/18/2020 05:20 PM   TSH 2.85 11/19/2018 09:22 AM   TSH 2.85 10/14/2012 10:28 AM    CBC Latest Ref Rng & Units 01/15/2021 01/13/2021 01/12/2021  WBC 4.0 - 10.5 K/uL 5.5 5.1 5.6  Hemoglobin 13.0 - 17.0 g/dL 11.3(L) 10.9(L) 11.7(L)  Hematocrit 39.0 - 52.0 % 38.0(L) 37.8(L) 39.9  Platelets 150 - 400 K/uL 188 174 187    No results found for: VD25OH  Clinical ASCVD: No  The ASCVD Risk score (Arnett DK, et al., 2019) failed to calculate for the following reasons:   The 2019 ASCVD risk score is  only valid for ages 49 to 52    Depression screen PHQ 2/9 11/27/2020 11/26/2019 11/19/2018  Decreased Interest 0 0 0  Down, Depressed, Hopeless 0 0 0  PHQ - 2 Score 0 0 0     Social History   Tobacco Use  Smoking Status Former   Packs/day: 0.50   Years: 35.00   Pack years: 17.50   Types: Cigarettes   Quit date: 10/30/1966   Years since quitting: 54.2  Smokeless Tobacco Never   BP Readings from Last 3 Encounters:  01/16/21 119/72  11/27/20 130/68  11/03/20 124/78   Pulse Readings from Last 3 Encounters:  01/16/21 (!) 106  11/27/20 (!) 101  11/03/20 99   Wt Readings from Last 3 Encounters:  01/08/21 160 lb (72.6 kg)  11/27/20 163 lb 6.4 oz (74.1 kg)  11/03/20 162 lb 9.6 oz (73.8 kg)   BMI Readings from Last  3 Encounters:  01/08/21 22.96 kg/m  11/27/20 23.45 kg/m  11/03/20 23.33 kg/m    Assessment/Interventions: Review of patient past medical history, allergies, medications, health status, including review of consultants reports, laboratory and other test data, was performed as part of comprehensive evaluation and provision of chronic care management services.   SDOH:  (Social Determinants of Health) assessments and interventions performed: Yes  SDOH Screenings   Alcohol Screen: Not on file  Depression (PHQ2-9): Low Risk    PHQ-2 Score: 0  Financial Resource Strain: Not on file  Food Insecurity: Not on file  Housing: Not on file  Physical Activity: Not on file  Social Connections: Not on file  Stress: Not on file  Tobacco Use: Medium Risk   Smoking Tobacco Use: Former   Smokeless Tobacco Use: Never  Transportation Needs: Not on file    [place]  Allergies  Allergen Reactions   Lisinopril Rash    Medications Reviewed Today     Reviewed by Tomasa Blase, [facility] (Pharmacist) on 01/25/21 at 1551  Med List Status: <None>   Medication Order Taking? Sig Documenting Provider Last Dose Status Informant  apixaban (ELIQUIS) 5 MG TABS tablet [id number] Yes  Take 1 tablet (5 mg total) by mouth 2 (two) times daily. [name], MD Taking Active Self  fluticasone Asencion Islam) 50 MCG/ACT nasal spray [id number] Yes Place 2 sprays into both nostrils daily. [name], MD Taking Active Family Member  furosemide (LASIX) 20 MG tablet [id number] Yes Take 1 tablet (20 mg total) by mouth daily. Mendel Corning, MD Taking Active   levalbuterol [facility] HFA) 45 MCG/ACT inhaler [id number] No Inhale 1 puff into the lungs every 4 (four) hours as needed for wheezing or shortness of breath.  Patient not taking: Reported on 01/25/2021   [name], MD Not Taking Active   metoprolol tartrate (LOPRESSOR) 25 MG tablet [id number] Yes Take 1 tablet (25 mg total) by mouth 2 (two) times daily. [name], MD Taking Active Self  Multiple Vitamin (MULTIVITAMIN WITH MINERALS) TABS tablet [id number] Yes Take 1 tablet by mouth in the morning. Centrum Silver for Men 50+ [provider] Taking Active Self  Polyethylene Glycol 3350 (MIRALAX PO) [id number] Yes Take 17 g by mouth daily as needed (constipation.). [provider] Taking Active Self  senna-docusate (SENOKOT-S) 8.6-50 MG tablet [id number] Yes Take 1 tablet by mouth at bedtime as needed for mild constipation or moderate constipation.  Patient taking differently: Take 1 tablet by mouth at bedtime.   Damita Lack, MD Taking Active   simvastatin (ZOCOR) 20 MG tablet [id number] Yes Take 1 tablet (20 mg total) by mouth daily at 6 PM. [name], MD Taking Active Self  spironolactone (ALDACTONE) 50 MG tablet [id number] Yes Take 1 tablet (50 mg total) by mouth daily. Rai, Vernelle Emerald, MD Taking Active   tamsulosin (FLOMAX) 0.4 MG CAPS capsule [id number] Yes Take 0.4 mg by mouth in the morning. [provider] Taking Active Self            Patient Active Problem List   Diagnosis Date Noted   Pressure injury of skin 01/14/2021   Pleural effusion, left  01/09/2021   Liver lesion 01/09/2021   Other cirrhosis of liver ([place]) 01/09/2021   Loculated right lateral pneumothorax 01/08/2021   Leg swelling 11/27/2020   Malignant neoplasm of upper lobe of left lung ([place]) 10/31/2020   Recurrent right pleural effusion    Atrial fibrillation ([place])  S/P thoracentesis    SOB (shortness of breath) 09/15/2020   Pleural effusion, right 09/15/2020   Acute hypoxemic respiratory failure ([place]) 09/15/2020   Atrial fibrillation, new onset ([place]) 09/15/2020   Wheezing 04/24/2019   Routine general medical examination at a health care facility 06/23/2014   Inguinal hernia 05/05/2014   Moderate aortic stenosis 10/30/2011   Abnormal ECG 09/24/2010   Type 2 diabetes mellitus with hyperlipidemia ([place]) 03/26/2010   Hyperlipidemia associated with type 2 diabetes mellitus ([place]) 12/28/2007   Essential hypertension 02/07/2007    Immunization History  Administered Date(s) Administered   Fluad Quad(high Dose 65+) 03/05/2019, 04/07/2020   Influenza Split 03/23/2012   Influenza Whole 02/17/2009, 03/26/2010   Influenza, High Dose Seasonal PF 04/16/2013, 03/22/2014, 01/12/2015, 02/10/2017, 03/20/2018   Influenza,inj,Quad PF,6+ Mos 01/11/2016   PFIZER Comirnaty(Gray Top)Covid-19 Tri-Sucrose Vaccine 02/18/2020   PFIZER(Purple Top)SARS-COV-2 Vaccination 06/05/2019, 06/26/2019   Pneumococcal Conjugate-13 01/12/2015   Pneumococcal Polysaccharide-23 05/23/2003   Td 10/14/2012    Conditions to be addressed/monitored:  Hypertension, Hyperlipidemia, Diabetes, Atrial Fibrillation and Chronic Kidney Disease  Care Plan : CCM Care Plan  Updates made by Tomasa Blase, RPH since 01/25/2021 12:00 AM     Problem: HTN, AFib, HLD, prediabetes, CKD   Priority: High  Onset Date: 10/25/2020     Long-Range Goal: Disease Management   Start Date: 10/25/2020  Expected End Date: 04/26/2021  Recent Progress: On track  Priority: High  Note:    Current Barriers:  Unable to  independently monitor therapeutic efficacy  Pharmacist Clinical Goal(s):  Patient will verbalize ability to afford treatment regimen achieve adherence to monitoring guidelines and medication adherence to achieve therapeutic efficacy maintain control of LDL and BP as evidenced by lipid panel and blood pressure logs   through collaboration with PharmD and provider.   Interventions: 1:1 collaboration with [name], MD regarding development and update of comprehensive plan of care as evidenced by provider attestation and co-signature Inter-disciplinary care team collaboration (see longitudinal plan of care) Comprehensive medication review performed; medication list updated in electronic medical record  Hyperlipidemia: (LDL goal < 100) -Controlled  Lab Results  Component Value Date   [place] 72 09/19/2020  -Current treatment: Simvastatin 30m daily  -Medications previously tried: n/a  -Current dietary patterns: reports that diet is low in sodium / fried and fatty foods  -Current exercise habits: limited at this time  -Educated on Cholesterol goals;  Benefits of statin for ASCVD risk reduction; Importance of limiting foods high in cholesterol; -Counseled on diet and exercise extensively Recommended to continue current medication  Prediabetes (A1c goal <7%) -Controlled (diet) Lab Results  Component Value Date   HGBA1C 6.2 09/15/2020  -Current medications: n/a -Medications previously tried: n/a  -Denies hypoglycemic/hyperglycemic symptoms -Current meal patterns:  breakfast: eggs, bacon, small biscuit  lunch: low sodium meats / bread  dinner: homemade meals - watching carb intake very closely  -Current exercise: limited at this time  -Educated on A1c and blood sugar goals; Complications of diabetes including kidney damage, retinal damage, and cardiovascular disease; -Counseled to check feet daily and get yearly eye exams -Counseled on diet and exercise  extensively  Atrial Fibrillation/ Hypertension (Goal: prevent stroke/ heart attack and major bleeding / Blood pressure <130/80) -Controlled -CHADSVASC: 4 -Current treatment: Rate control: metoprolol tartrate 230mtwice daily  Anticoagulation: Eliquis 16m31mwice daily   Furosemide 27m61mily  Spironolactone 50mg21m tablet daily  -Medications previously tried: amlodipine, hctz, losartan  -Home BP and HR readings:daughter reports that BP  has been well controlled, could not recall recent blood pressures, reports home nurse has been recording during their visits   -Counseled on increased risk of stroke due to Afib and benefits of anticoagulation for stroke prevention; importance of adherence to anticoagulant exactly as prescribed; bleeding risk associated with Eliquis and importance of self-monitoring for signs/symptoms of bleeding; avoidance of NSAIDs due to increased bleeding risk with anticoagulants; seeking medical attention after a head injury or if there is blood in the urine/stool;  -Denies hypotensive/hypertensive symptoms -Educated on BP goals and benefits of medications for prevention of heart attack, stroke and kidney damage; Daily salt intake goal < 2300 mg; Importance of home blood pressure monitoring; -Counseled to monitor BP at home weekly, document, and provide log at future appointments -Recommended to continue current medication  Shortness of Breath / Wheezing (Goal: Prevention of SOB) -Not ideally controlled -Current treatment  Levalbuterol 39mg/act - 1 puff every 4 hours as needed - has not been able to start due to insurance requiring a PA  -Medications previously tried: Albuterol   - Will complete PA for levalbuterol inhaler   BPH (Goal: prevention of urinary symptoms/ improvement in urinary flow) -Controlled -Current treatment  Tamsulosin 0.469m- 1 capsule daily  -Medications previously tried: n/a  -Recommended to continue current medication   Chronic Kidney  Disease (Goal: Maintenance of kidney function / prevention of disease progression) -Controlled  - Most recent CrCl = 42.3 mL/min - Most recent eGFR = 5889min -Current treatment  Appropriately dosed medications based on most recent kidney function tests  -Recommended to continue current medication Counseled on avoidance of use of over the counter NSAIDS   Health Maintenance -Current therapy:  Fluticasone 51m29mct - 2 sprays into each nostril daily  Multivitamin - 1 tablet daily  Senna-docusate 8.6-51mg - 1 tablet daily as needed for constipation - uses about once a month Miralax - 17g daily as needed for constipation - uses about once a month -Educated on Cost vs benefit of each product must be carefully weighed by individual consumer -Patient is satisfied with current therapy and denies issues -Recommended to continue current medication   Patient Goals/Self-Care Activities Patient will:  - take medications as prescribed check blood pressure weekly, document, and provide at future appointments  Follow Up Plan: Telephone follow up appointment with care management team member scheduled for: 2 months The patient has been provided with contact information for the care management team and has been advised to call with any health related questions or concerns.          Medication Assistance:  Patient reports that he applied for patient assistance for eliquis in the past but was denied, able to afford medications at this time   [facility]  [id number] [place] [id number]: [phone]: [phone] - [place] [id number]: [phone]: [phone] pill box? Yes Pt endorses 100% compliance  Care Plan and Follow Up Patient Decision:   Patient requests no follow-up at this time.  Plan: Telephone follow up appointment with care management team member scheduled for:  3 months and The patient has been provided with contact information for the care management team and has been advised to call with any health related questions or concerns.   DaniTomasa BlasearmD Clinical  Pharmacist, [facility]

## 2021-01-25 NOTE — Patient Instructions (Signed)
Adam Briggs,  It was great to talk to you today!  Please call me with any questions or concerns.  Visit Information  PATIENT GOALS:  Goals Addressed             This Visit's Progress    Track and Manage My Blood Pressure-Hypertension/ Afib   On track    Timeframe:  Long-Range Goal Priority:  High Start Date:  10/25/2020                           Expected End Date:  04/26/2021                     Follow Up Date 01/25/2021   - check blood pressure weekly - write blood pressure results in a log or diary    Why is this important?   You won't feel high blood pressure, but it can still hurt your blood vessels.  High blood pressure can cause heart or kidney problems. It can also cause a stroke.  Making lifestyle changes like losing a little weight or eating less salt will help.  Checking your blood pressure at home and at different times of the day can help to control blood pressure.  If the doctor prescribes medicine remember to take it the way the doctor ordered.  Call the office if you cannot afford the medicine or if there are questions about it.     Notes: Patient will reach out to clinic should BP become uncontrolled (>130/80) / HR become uncontrolled (HR >100 or <60 bpm)        Patient verbalizes understanding of instructions provided today and agrees to view in [place].   Telephone follow up appointment with care management team member scheduled for: 6 weeks  The patient has been provided with contact information for the care management team and has been advised to call with any health related questions or concerns.   Tomasa Blase, PharmD Clinical Pharmacist, [place]

## 2021-01-27 NOTE — Progress Notes (Signed)
Cardiology Office Note   Date:  01/29/2021   ID:  Adam Briggs, DOB [birth year], MRN [id number]  PCP:  [name], MD  Cardiologist:   Minus Breeding, MD Referring:      Chief Complaint  Patient presents with   Shortness of Breath       History of Present Illness: Adam Briggs is a 87 y.o. male who presents for follow up of atrial fib.   He is being managed for non small cell lung cancer.  Since I last saw him he was in the hospital with respiratory failure and had bilateral thoracentesis.   I reviewed these extensive notes.  He has had neoplasm of the left upper lung and has had a completed course of radiation.  He has chronic non-small cell lung cancer.  Echocardiography earlier this year suggested moderate aortic stenosis.  There is been some mention of diastolic dysfunction and acute diastolic heart failure.  Thoracentesis is not demonstrated clear etiology for his effusion and there were no malignant cells noted.  There was consideration of a pleural catheter but he went to [facility] to have another opinion.  Currently he is being followed and has a CT scheduled.  He has residual loculated right effusion.  Of note there is discussion of cirrhosis.  When he was discharged from the hospital he was sent home on spironolactone with a lower dose of Lasix.  He is on continuous 2 L oxygen.  He feels good with this.  He does have some occasional increased lower extremity swelling.  His weights have been stable.  He is not having any cough fevers or chills.  He is not having any chest pressure, neck or arm discomfort.   Past Medical History:  Diagnosis Date   Atrial fibrillation ([place])    BENIGN PROSTATIC HYPERTROPHY    Cataract    DIVERTICULOSIS, COLON    DM    not diabetic-has been off metformin for about 2 years as of 09/2020 pr daughter   GERD    Heart murmur    Hernia    HYPERCHOLESTEROLEMIA    HYPERTENSION    Lung cancer ([place])    Moderate aortic stenosis  10/30/2011   echo 2013   Prostate cancer [facility])    s/p seed implant   Recurrent right pleural effusion     Past Surgical History:  Procedure Laterality Date   BRONCHIAL BIOPSY  10/17/2020   Procedure: BRONCHIAL BIOPSIES;  Surgeon: Garner Nash, DO;  Location: [facility];  Service: Pulmonary;;   BRONCHIAL BRUSHINGS  10/17/2020   Procedure: BRONCHIAL BRUSHINGS;  Surgeon: Garner Nash, DO;  Location: [place] [facility];  Service: Pulmonary;;   BRONCHIAL NEEDLE ASPIRATION BIOPSY  10/17/2020   Procedure: BRONCHIAL NEEDLE ASPIRATION BIOPSIES;  Surgeon: Garner Nash, DO;  Location: [facility];  Service: Pulmonary;;   BRONCHIAL WASHINGS  10/17/2020   Procedure: BRONCHIAL WASHINGS;  Surgeon: Garner Nash, DO;  Location: [place] [facility];  Service: Pulmonary;;   CATARACT EXTRACTION W/ INTRAOCULAR LENS IMPLANT  02/2013   R, then L 4 weeks later - [place]   FIDUCIAL MARKER PLACEMENT  10/17/2020   Procedure: FIDUCIAL MARKER PLACEMENT;  Surgeon: Garner Nash, DO;  Location: [place] [facility];  Service: Pulmonary;;   INGUINAL HERNIA REPAIR     Right   INGUINAL HERNIA REPAIR Left 07/15/2014   Procedure: LEFT INGUINAL HERNIA REPAIR WITH MESH;  Surgeon: Armandina Gemma, MD;  Location: [facility];  Service: General;  Laterality: Left;  INSERTION OF MESH Left 07/15/2014   Procedure: INSERTION OF MESH;  Surgeon: Armandina Gemma, MD;  Location: [facility];  Service: General;  Laterality: Left;   RADIOACTIVE SEED IMPLANT     THORACENTESIS N/A 01/05/2021   Procedure: THORACENTESIS;  Surgeon: Candee Furbish, MD;  Location: [facility];  Service: Pulmonary;  Laterality: N/A;   VIDEO BRONCHOSCOPY WITH ENDOBRONCHIAL NAVIGATION Left 10/17/2020   Procedure: VIDEO BRONCHOSCOPY WITH ENDOBRONCHIAL NAVIGATION;  Surgeon: Garner Nash, DO;  Location: [place];  Service: Pulmonary;  Laterality: Left;   VIDEO BRONCHOSCOPY WITH ENDOBRONCHIAL ULTRASOUND Bilateral 10/17/2020   Procedure: VIDEO BRONCHOSCOPY WITH ENDOBRONCHIAL  ULTRASOUND;  Surgeon: Garner Nash, DO;  Location: [place] [facility];  Service: Pulmonary;  Laterality: Bilateral;     Current Outpatient Medications  Medication Sig Dispense Refill   apixaban (ELIQUIS) 5 MG TABS tablet Take 1 tablet (5 mg total) by mouth 2 (two) times daily. 180 tablet 1   fluticasone (FLONASE) 50 MCG/ACT nasal spray Place 2 sprays into both nostrils daily. 16 g 6   furosemide (LASIX) 20 MG tablet Take 1 tablet (20 mg total) by mouth daily. 30 tablet 3   levalbuterol (XOPENEX HFA) 45 MCG/ACT inhaler Inhale 1 puff into the lungs every 4 (four) hours as needed for wheezing or shortness of breath. 1 each 2   metoprolol tartrate (LOPRESSOR) 25 MG tablet Take 1 tablet (25 mg total) by mouth 2 (two) times daily. 180 tablet 3   Multiple Vitamin (MULTIVITAMIN WITH MINERALS) TABS tablet Take 1 tablet by mouth in the morning. Centrum Silver for Men 50+     Polyethylene Glycol 3350 (MIRALAX PO) Take 17 g by mouth daily as needed (constipation.).     senna-docusate (SENOKOT-S) 8.6-50 MG tablet Take 1 tablet by mouth at bedtime as needed for mild constipation or moderate constipation. (Patient taking differently: Take 1 tablet by mouth at bedtime.) 30 tablet 1   simvastatin (ZOCOR) 20 MG tablet Take 1 tablet (20 mg total) by mouth daily at 6 PM. 90 tablet 3   spironolactone (ALDACTONE) 50 MG tablet Take 1 tablet (50 mg total) by mouth daily. 30 tablet 3   tamsulosin (FLOMAX) 0.4 MG CAPS capsule Take 0.4 mg by mouth in the morning.     No current facility-administered medications for this visit.    Allergies:   Lisinopril   ROS:  Please see the history of present illness.   Otherwise, review of systems are positive for none.   All other systems are reviewed and negative.    PHYSICAL EXAM: VS:  BP 122/72   Pulse 63   Ht 5\' 10"  (1.778 m)   Wt 155 lb 3.2 oz (70.4 kg)   SpO2 99%   BMI 22.27 kg/m  , BMI Body mass index is 22.27 kg/m. GENERAL:  Well appearing NECK:  No jugular venous  distention, waveform within normal limits, carotid upstroke brisk and symmetric, no bruits, no thyromegaly LUNGS:  Clear to auscultation bilaterally CHEST: Decreased breath sounds in the right one half of the way up HEART:  PMI not displaced or sustained,S1 and S2 within normal limits, no S3,  no clicks, no rubs, he had a 6 apical systolic murmur radiating at the aortic outflow tract, no diastolic murmurs, irregular  ABD:  Flat, positive bowel sounds normal in frequency in pitch, no bruits, no rebound, no guarding, no midline pulsatile mass, no hepatomegaly, no splenomegaly EXT:  2 plus pulses throughout, no edema, no cyanosis no clubbing   EKG:  EKG  is not  ordered today.    Recent Labs: 09/15/2020: Pro B Natriuretic peptide (BNP) 238.0 09/16/2020: Magnesium 2.0 09/18/2020: TSH 3.642 01/08/2021: ALT 16 01/14/2021: B Natriuretic Peptide 357.5 01/15/2021: BUN 24; Creatinine, Ser 0.80; Hemoglobin 11.3; Platelets 188; Potassium 4.7; Sodium 136    Lipid Panel    Component Value Date/Time   CHOL 117 09/19/2020 0031   TRIG 43 09/19/2020 0031   HDL 36 (L) 09/19/2020 0031   CHOLHDL 3.3 09/19/2020 0031   VLDL 9 09/19/2020 0031   LDLCALC 72 09/19/2020 0031   LDLCALC 94 11/26/2019 1549   LDLDIRECT 110.2 12/28/2007 1123      Wt Readings from Last 3 Encounters:  01/29/21 155 lb 3.2 oz (70.4 kg)  01/08/21 160 lb (72.6 kg)  11/27/20 163 lb 6.4 oz (74.1 kg)      Other studies Reviewed: Additional studies/ records that were reviewed today include: Extensive review of hospital records Review of the above records demonstrates:  Please see elsewhere in the note.     ASSESSMENT AND PLAN:   Atrial fib:  Mr. JULY NICKSON has a CHA2DS2 - VASc score of 4.  He tolerates anticoagulation has had reasonable rate control.  No change in therapy.   Bilateral pleural effusion:    He is going to have a CT scan of his chest and go back to see Dr. [name].  There could be some component of congestive heart  failure with diastolic dysfunction and his aortic valve.  However, I am not thinking that aortic valve therapy would significantly treat this issue.  He can continue with his spironolactone and we talked about increased as needed dosing of his Lasix.   In the future if there is no clear etiology and I think any further evaluation of his aortic valve I could consider right heart catheterization and a TEE although this would be rather aggressive therapy and again I am not convinced that diastolic heart failure or aortic stenosis is the primary concern.  He seems to have good quality of life and few symptoms with his oxygen and diuretics.   Aortic stenosis:    This was moderate on echo this year.  As above.    Current medicines are reviewed at length with the patient today.  The patient does not have concerns regarding medicines.  The following changes have been made:  no change  Labs/ tests ordered today include:   None  No orders of the defined types were placed in this encounter.     Disposition:   FU with me in 3 months.  Signed, Minus Breeding, MD  01/29/2021 10:19 AM    [facility]

## 2021-01-29 ENCOUNTER — Other Ambulatory Visit: Payer: Self-pay

## 2021-01-29 ENCOUNTER — Encounter: Payer: Self-pay | Admitting: Cardiology

## 2021-01-29 ENCOUNTER — Ambulatory Visit (INDEPENDENT_AMBULATORY_CARE_PROVIDER_SITE_OTHER): Payer: Medicare Other | Admitting: Cardiology

## 2021-01-29 VITALS — BP 122/72 | HR 63 | Ht 70.0 in | Wt 155.2 lb

## 2021-01-29 DIAGNOSIS — I350 Nonrheumatic aortic (valve) stenosis: Secondary | ICD-10-CM | POA: Diagnosis not present

## 2021-01-29 DIAGNOSIS — I480 Paroxysmal atrial fibrillation: Secondary | ICD-10-CM

## 2021-01-29 NOTE — Patient Instructions (Signed)
Medication Instructions:  The current medical regimen is effective;  continue present plan and medications.  *If you need a refill on your cardiac medications before your next appointment, please call your pharmacy*   Follow-Up: At [facility], you and your health needs are our priority.  As part of our continuing mission to provide you with exceptional heart care, we have created designated Provider Care Teams.  These Care Teams include your primary Cardiologist (physician) and Advanced Practice Providers (APPs -  Physician Assistants and Nurse Practitioners) who all work together to provide you with the care you need, when you need it.  We recommend signing up for the patient portal called "MyChart".  Sign up information is provided on this After Visit Summary.  MyChart is used to connect with patients for Virtual Visits (Telemedicine).  Patients are able to view lab/test results, encounter notes, upcoming appointments, etc.  Non-urgent messages can be sent to your provider as well.   To learn more about what you can do with MyChart, go to [email or web address].    Your next appointment:   3 month(s)  The format for your next appointment:   In Person  Provider:   Minus Breeding, MD

## 2021-01-30 DIAGNOSIS — J90 Pleural effusion, not elsewhere classified: Secondary | ICD-10-CM | POA: Diagnosis not present

## 2021-01-30 DIAGNOSIS — J9601 Acute respiratory failure with hypoxia: Secondary | ICD-10-CM | POA: Diagnosis not present

## 2021-01-30 DIAGNOSIS — J869 Pyothorax without fistula: Secondary | ICD-10-CM | POA: Diagnosis not present

## 2021-01-30 DIAGNOSIS — J948 Other specified pleural conditions: Secondary | ICD-10-CM | POA: Diagnosis not present

## 2021-01-30 DIAGNOSIS — L89322 Pressure ulcer of left buttock, stage 2: Secondary | ICD-10-CM | POA: Diagnosis not present

## 2021-01-30 DIAGNOSIS — K7460 Unspecified cirrhosis of liver: Secondary | ICD-10-CM | POA: Diagnosis not present

## 2021-02-01 ENCOUNTER — Other Ambulatory Visit: Payer: Self-pay

## 2021-02-02 ENCOUNTER — Ambulatory Visit (INDEPENDENT_AMBULATORY_CARE_PROVIDER_SITE_OTHER): Payer: Medicare Other | Admitting: Internal Medicine

## 2021-02-02 ENCOUNTER — Encounter: Payer: Self-pay | Admitting: Internal Medicine

## 2021-02-02 VITALS — BP 126/76 | HR 97 | Temp 98.5°F | Resp 18 | Ht 70.0 in | Wt 153.6 lb

## 2021-02-02 DIAGNOSIS — I10 Essential (primary) hypertension: Secondary | ICD-10-CM | POA: Diagnosis not present

## 2021-02-02 DIAGNOSIS — K7469 Other cirrhosis of liver: Secondary | ICD-10-CM

## 2021-02-02 DIAGNOSIS — R21 Rash and other nonspecific skin eruption: Secondary | ICD-10-CM

## 2021-02-02 DIAGNOSIS — J90 Pleural effusion, not elsewhere classified: Secondary | ICD-10-CM | POA: Diagnosis not present

## 2021-02-02 LAB — CBC
HCT: 37.2 % — ABNORMAL LOW (ref 39.0–52.0)
Hemoglobin: 11.7 g/dL — ABNORMAL LOW (ref 13.0–17.0)
MCHC: 31.4 g/dL (ref 30.0–36.0)
MCV: 80.2 fl (ref 78.0–100.0)
Platelets: 161 10*3/uL (ref 150.0–400.0)
RBC: 4.63 Mil/uL (ref 4.22–5.81)
RDW: 17.9 % — ABNORMAL HIGH (ref 11.5–15.5)
WBC: 6.2 10*3/uL (ref 4.0–10.5)

## 2021-02-02 LAB — COMPREHENSIVE METABOLIC PANEL
ALT: 10 U/L (ref 0–53)
AST: 11 U/L (ref 0–37)
Albumin: 3.6 g/dL (ref 3.5–5.2)
Alkaline Phosphatase: 55 U/L (ref 39–117)
BUN: 31 mg/dL — ABNORMAL HIGH (ref 6–23)
CO2: 37 mEq/L — ABNORMAL HIGH (ref 19–32)
Calcium: 10 mg/dL (ref 8.4–10.5)
Chloride: 97 mEq/L (ref 96–112)
Creatinine, Ser: 1.32 mg/dL (ref 0.40–1.50)
GFR: 47.77 mL/min — ABNORMAL LOW (ref >60.00)
Glucose, Bld: 127 mg/dL — ABNORMAL HIGH (ref 70–99)
Potassium: 4.7 mEq/L (ref 3.5–5.1)
Sodium: 139 mEq/L (ref 135–145)
Total Bilirubin: 0.7 mg/dL (ref 0.2–1.2)
Total Protein: 6.3 g/dL (ref 6.0–8.3)

## 2021-02-02 MED ORDER — VALACYCLOVIR HCL 1 G PO TABS: 1000.0000 mg | ORAL_TABLET | Freq: Three times a day (TID) | ORAL | 0 refills | Status: AC

## 2021-02-02 NOTE — Progress Notes (Signed)
   Subjective:   Patient ID: Adam Briggs, male    DOB: October 28, 1929, 87 y.o.   MRN: [id number]  HPI The patient is an 87 YO man coming in for hospital follow up. They did try to drain fluid from the right lung. They did adjust the medications for fluid and he is feeling okay. Saw pulmonary at [facility] and they are getting CT scan next week. New rash on the right arm which is new in the last few days.  Review of Systems  Constitutional: Negative.   HENT: Negative.    Eyes: Negative.   Respiratory:  Negative for cough, chest tightness and shortness of breath.   Cardiovascular:  Negative for chest pain, palpitations and leg swelling.  Gastrointestinal:  Negative for abdominal distention, abdominal pain, constipation, diarrhea, nausea and vomiting.  Musculoskeletal: Negative.   Skin: Negative.   Neurological: Negative.   Psychiatric/Behavioral: Negative.     Objective:  Physical Exam Constitutional:      Appearance: He is well-developed.  HENT:     Head: Normocephalic and atraumatic.  Cardiovascular:     Rate and Rhythm: Normal rate and regular rhythm.  Pulmonary:     Effort: Pulmonary effort is normal. No respiratory distress.     Breath sounds: Wheezing and rales present.     Comments: oxygen Abdominal:     General: Bowel sounds are normal. There is no distension.     Palpations: Abdomen is soft.     Tenderness: There is no abdominal tenderness. There is no rebound.  Musculoskeletal:     Cervical back: Normal range of motion.  Skin:    General: Skin is warm and dry.  Neurological:     Mental Status: He is alert and oriented to person, place, and time.     Coordination: Coordination normal.    Vitals:   02/02/21 1047  BP: 126/76  Pulse: 97  Resp: 18  Temp: 98.5 F (36.9 C)  TempSrc: Oral  SpO2: 94%  Weight: 153 lb 9.6 oz (69.7 kg)  Height: 5\' 10"  (1.778 m)    This visit occurred during the SARS-CoV-2 public health emergency.  Safety protocols were in place, including  screening questions prior to the visit, additional usage of staff PPE, and extensive cleaning of exam room while observing appropriate contact time as indicated for disinfecting solutions.   Assessment & Plan:

## 2021-02-02 NOTE — Assessment & Plan Note (Signed)
This is still a possibility and they added spironolactone in the hospital to help with fluid management and this has helped. Checking CMP and adjust regimen which is currently lasix 20 mg daily and spironolactone 50 mg daily.

## 2021-02-02 NOTE — Assessment & Plan Note (Signed)
Checking CBC and CMP today given change in regimen. Lasix decreased to 20 mg daily and added spironolactone 50 mg daily. Adjust as needed. Fluid status stable today.

## 2021-02-02 NOTE — Assessment & Plan Note (Signed)
Suspicious for shingles with some blistering appearance. Given rx valtrex 1000 mg TID for 1 week to help.

## 2021-02-02 NOTE — Patient Instructions (Signed)
We have sent in valtrex to take 1 pill 3 times a day for 1 week for the possible shingles.

## 2021-02-05 ENCOUNTER — Telehealth: Payer: Self-pay

## 2021-02-05 NOTE — Telephone Encounter (Signed)
-----   Message from [name], MD sent at 02/02/2021 10:05 PM EDT ----- The kidney function is slightly worse and I would recommend to recheck in 2-3 weeks. Otherwise normal.

## 2021-02-05 NOTE — Telephone Encounter (Signed)
Informed patient daughter of results and recommendations.  Daughter wants to reach out to urology.

## 2021-02-07 ENCOUNTER — Telehealth: Payer: Self-pay | Admitting: Radiation Oncology

## 2021-02-07 DIAGNOSIS — K7460 Unspecified cirrhosis of liver: Secondary | ICD-10-CM | POA: Diagnosis not present

## 2021-02-07 DIAGNOSIS — J9601 Acute respiratory failure with hypoxia: Secondary | ICD-10-CM | POA: Diagnosis not present

## 2021-02-07 DIAGNOSIS — J90 Pleural effusion, not elsewhere classified: Secondary | ICD-10-CM | POA: Diagnosis not present

## 2021-02-07 DIAGNOSIS — J948 Other specified pleural conditions: Secondary | ICD-10-CM | POA: Diagnosis not present

## 2021-02-07 DIAGNOSIS — L89322 Pressure ulcer of left buttock, stage 2: Secondary | ICD-10-CM | POA: Diagnosis not present

## 2021-02-07 DIAGNOSIS — J869 Pyothorax without fistula: Secondary | ICD-10-CM | POA: Diagnosis not present

## 2021-02-07 NOTE — Telephone Encounter (Signed)
Pt had recent imaging of the chest with Dr. Chase Caller. I will move his next scan for surveillance out to February 2023.

## 2021-02-08 DIAGNOSIS — J869 Pyothorax without fistula: Secondary | ICD-10-CM | POA: Diagnosis not present

## 2021-02-08 DIAGNOSIS — J9601 Acute respiratory failure with hypoxia: Secondary | ICD-10-CM | POA: Diagnosis not present

## 2021-02-08 DIAGNOSIS — J948 Other specified pleural conditions: Secondary | ICD-10-CM | POA: Diagnosis not present

## 2021-02-08 DIAGNOSIS — J90 Pleural effusion, not elsewhere classified: Secondary | ICD-10-CM | POA: Diagnosis not present

## 2021-02-08 DIAGNOSIS — L89322 Pressure ulcer of left buttock, stage 2: Secondary | ICD-10-CM | POA: Diagnosis not present

## 2021-02-08 DIAGNOSIS — K7460 Unspecified cirrhosis of liver: Secondary | ICD-10-CM | POA: Diagnosis not present

## 2021-02-09 DIAGNOSIS — R918 Other nonspecific abnormal finding of lung field: Secondary | ICD-10-CM | POA: Diagnosis not present

## 2021-02-09 DIAGNOSIS — R590 Localized enlarged lymph nodes: Secondary | ICD-10-CM | POA: Diagnosis not present

## 2021-02-09 DIAGNOSIS — J90 Pleural effusion, not elsewhere classified: Secondary | ICD-10-CM | POA: Diagnosis not present

## 2021-02-09 DIAGNOSIS — C3412 Malignant neoplasm of upper lobe, left bronchus or lung: Secondary | ICD-10-CM | POA: Diagnosis not present

## 2021-02-09 DIAGNOSIS — R911 Solitary pulmonary nodule: Secondary | ICD-10-CM | POA: Diagnosis not present

## 2021-02-12 ENCOUNTER — Telehealth: Payer: Self-pay

## 2021-02-12 NOTE — Telephone Encounter (Signed)
Please advise as the pts daughter has stated she contacted the O2 company that the pt was d/c'd from the hospital with which is [facility] and was informed they do not have any O2 E-Tanks. The tanks they did bring were half full upon being delivery and they never came back.   *Pt daughter would like to switch O2 companies to [employer] [facility] in [place]. Address is [address], [place], [place] [address]. Tel: [phone], FAX:[phone]  **Pts daughter states the new company needs pts most recent visit notes, most recent hospital O2 test, and a rx from PCP for O2 services.  **Please call pts daughter [name] at [phone].

## 2021-02-13 LAB — FUNGUS CULTURE RESULT

## 2021-02-13 LAB — FUNGUS CULTURE WITH STAIN

## 2021-02-13 LAB — FUNGAL ORGANISM REFLEX

## 2021-02-14 DIAGNOSIS — J948 Other specified pleural conditions: Secondary | ICD-10-CM | POA: Diagnosis not present

## 2021-02-14 DIAGNOSIS — J9601 Acute respiratory failure with hypoxia: Secondary | ICD-10-CM | POA: Diagnosis not present

## 2021-02-14 DIAGNOSIS — K7460 Unspecified cirrhosis of liver: Secondary | ICD-10-CM | POA: Diagnosis not present

## 2021-02-14 DIAGNOSIS — J90 Pleural effusion, not elsewhere classified: Secondary | ICD-10-CM | POA: Diagnosis not present

## 2021-02-14 DIAGNOSIS — L89322 Pressure ulcer of left buttock, stage 2: Secondary | ICD-10-CM | POA: Diagnosis not present

## 2021-02-14 DIAGNOSIS — J869 Pyothorax without fistula: Secondary | ICD-10-CM | POA: Diagnosis not present

## 2021-02-14 NOTE — Telephone Encounter (Signed)
Ok to wait for pcp

## 2021-02-16 DIAGNOSIS — I4891 Unspecified atrial fibrillation: Secondary | ICD-10-CM

## 2021-02-16 DIAGNOSIS — I10 Essential (primary) hypertension: Secondary | ICD-10-CM

## 2021-02-17 DIAGNOSIS — L89322 Pressure ulcer of left buttock, stage 2: Secondary | ICD-10-CM | POA: Diagnosis not present

## 2021-02-17 DIAGNOSIS — E119 Type 2 diabetes mellitus without complications: Secondary | ICD-10-CM | POA: Diagnosis not present

## 2021-02-17 DIAGNOSIS — Z87891 Personal history of nicotine dependence: Secondary | ICD-10-CM | POA: Diagnosis not present

## 2021-02-17 DIAGNOSIS — R911 Solitary pulmonary nodule: Secondary | ICD-10-CM | POA: Diagnosis not present

## 2021-02-17 DIAGNOSIS — C3412 Malignant neoplasm of upper lobe, left bronchus or lung: Secondary | ICD-10-CM | POA: Diagnosis not present

## 2021-02-17 DIAGNOSIS — N400 Enlarged prostate without lower urinary tract symptoms: Secondary | ICD-10-CM | POA: Diagnosis not present

## 2021-02-17 DIAGNOSIS — Z9181 History of falling: Secondary | ICD-10-CM | POA: Diagnosis not present

## 2021-02-17 DIAGNOSIS — I350 Nonrheumatic aortic (valve) stenosis: Secondary | ICD-10-CM | POA: Diagnosis not present

## 2021-02-17 DIAGNOSIS — Z9981 Dependence on supplemental oxygen: Secondary | ICD-10-CM | POA: Diagnosis not present

## 2021-02-17 DIAGNOSIS — I10 Essential (primary) hypertension: Secondary | ICD-10-CM | POA: Diagnosis not present

## 2021-02-17 DIAGNOSIS — J9601 Acute respiratory failure with hypoxia: Secondary | ICD-10-CM | POA: Diagnosis not present

## 2021-02-17 DIAGNOSIS — J948 Other specified pleural conditions: Secondary | ICD-10-CM | POA: Diagnosis not present

## 2021-02-17 DIAGNOSIS — J90 Pleural effusion, not elsewhere classified: Secondary | ICD-10-CM | POA: Diagnosis not present

## 2021-02-17 DIAGNOSIS — Z7901 Long term (current) use of anticoagulants: Secondary | ICD-10-CM | POA: Diagnosis not present

## 2021-02-17 DIAGNOSIS — K7460 Unspecified cirrhosis of liver: Secondary | ICD-10-CM | POA: Diagnosis not present

## 2021-02-17 DIAGNOSIS — E785 Hyperlipidemia, unspecified: Secondary | ICD-10-CM | POA: Diagnosis not present

## 2021-02-17 DIAGNOSIS — J869 Pyothorax without fistula: Secondary | ICD-10-CM | POA: Diagnosis not present

## 2021-02-17 DIAGNOSIS — Z8546 Personal history of malignant neoplasm of prostate: Secondary | ICD-10-CM | POA: Diagnosis not present

## 2021-02-17 DIAGNOSIS — Z923 Personal history of irradiation: Secondary | ICD-10-CM | POA: Diagnosis not present

## 2021-02-17 DIAGNOSIS — I4819 Other persistent atrial fibrillation: Secondary | ICD-10-CM | POA: Diagnosis not present

## 2021-02-20 ENCOUNTER — Telehealth: Payer: Self-pay | Admitting: Internal Medicine

## 2021-02-20 NOTE — Telephone Encounter (Signed)
1.Medication Requested: apixaban (ELIQUIS) 5 MG TABS tablet  2. Pharmacy (Name, [place]): [place], [place]  Phone:  [phone] Fax:  [phone]   3. On Med List: yes  4. Last Visit with PCP: 09.16.22  5. Next visit date with PCP: n/a   Agent: Please be advised that RX refills may take up to 3 business days. We ask that you follow-up with your pharmacy.

## 2021-02-20 NOTE — Telephone Encounter (Signed)
Called the company. They are wanting a rx for the oxygen in addition to the verbal that was given.

## 2021-02-20 NOTE — Telephone Encounter (Signed)
Fine for verbal order for O2 continuous @2 -3L

## 2021-02-20 NOTE — Telephone Encounter (Signed)
Please send most recent visit notes, most recent hospital O2 test, and a rx from PCP for O2 services to oxygen company on behalf of patient  [facility] in [place]. Address is [address], [place], [place] [address]. Tel: [phone], [place]

## 2021-02-21 MED ORDER — APIXABAN 5 MG PO TABS: 5.0000 mg | ORAL_TABLET | Freq: Two times a day (BID) | ORAL | 1 refills | Status: DC

## 2021-02-21 NOTE — Telephone Encounter (Signed)
Paperwork has been faxed to [facility] in [place] with the information provided below.

## 2021-02-21 NOTE — Telephone Encounter (Signed)
Medication has been sent to the patient pharmacy.

## 2021-02-22 ENCOUNTER — Telehealth: Payer: Self-pay | Admitting: Internal Medicine

## 2021-02-22 DIAGNOSIS — J869 Pyothorax without fistula: Secondary | ICD-10-CM | POA: Diagnosis not present

## 2021-02-22 DIAGNOSIS — L89322 Pressure ulcer of left buttock, stage 2: Secondary | ICD-10-CM | POA: Diagnosis not present

## 2021-02-22 DIAGNOSIS — J9601 Acute respiratory failure with hypoxia: Secondary | ICD-10-CM | POA: Diagnosis not present

## 2021-02-22 DIAGNOSIS — J90 Pleural effusion, not elsewhere classified: Secondary | ICD-10-CM | POA: Diagnosis not present

## 2021-02-22 DIAGNOSIS — J948 Other specified pleural conditions: Secondary | ICD-10-CM | POA: Diagnosis not present

## 2021-02-22 DIAGNOSIS — K7460 Unspecified cirrhosis of liver: Secondary | ICD-10-CM | POA: Diagnosis not present

## 2021-02-22 NOTE — Telephone Encounter (Signed)
Verbal order okay for BMP

## 2021-02-22 NOTE — Telephone Encounter (Signed)
Adam Briggs from [place] called  Patient's daughter contacted Adam Briggs to request them to draw the labs to recheck his kidney function. I did not see any standing orders for the recheck.   Requesting verbal orders to get the kidney function lab rechecked.   [phone]

## 2021-02-22 NOTE — Telephone Encounter (Signed)
See below

## 2021-02-23 NOTE — Telephone Encounter (Signed)
Called [facility]. LVM with verbal orders for lab draw. Office number was provided.

## 2021-02-26 DIAGNOSIS — L89322 Pressure ulcer of left buttock, stage 2: Secondary | ICD-10-CM | POA: Diagnosis not present

## 2021-02-26 DIAGNOSIS — J948 Other specified pleural conditions: Secondary | ICD-10-CM | POA: Diagnosis not present

## 2021-02-26 DIAGNOSIS — J90 Pleural effusion, not elsewhere classified: Secondary | ICD-10-CM | POA: Diagnosis not present

## 2021-02-26 DIAGNOSIS — I10 Essential (primary) hypertension: Secondary | ICD-10-CM | POA: Diagnosis not present

## 2021-02-26 DIAGNOSIS — J9601 Acute respiratory failure with hypoxia: Secondary | ICD-10-CM | POA: Diagnosis not present

## 2021-02-26 DIAGNOSIS — J869 Pyothorax without fistula: Secondary | ICD-10-CM | POA: Diagnosis not present

## 2021-02-26 DIAGNOSIS — N400 Enlarged prostate without lower urinary tract symptoms: Secondary | ICD-10-CM | POA: Diagnosis not present

## 2021-02-26 DIAGNOSIS — K7460 Unspecified cirrhosis of liver: Secondary | ICD-10-CM | POA: Diagnosis not present

## 2021-02-26 LAB — ACID FAST CULTURE WITH REFLEXED SENSITIVITIES (MYCOBACTERIA): Acid Fast Culture: NEGATIVE

## 2021-03-01 ENCOUNTER — Ambulatory Visit (INDEPENDENT_AMBULATORY_CARE_PROVIDER_SITE_OTHER): Payer: Medicare Other

## 2021-03-01 DIAGNOSIS — K7460 Unspecified cirrhosis of liver: Secondary | ICD-10-CM | POA: Diagnosis not present

## 2021-03-01 DIAGNOSIS — J869 Pyothorax without fistula: Secondary | ICD-10-CM | POA: Diagnosis not present

## 2021-03-01 DIAGNOSIS — J948 Other specified pleural conditions: Secondary | ICD-10-CM | POA: Diagnosis not present

## 2021-03-01 DIAGNOSIS — Z0000 Encounter for general adult medical examination without abnormal findings: Secondary | ICD-10-CM

## 2021-03-01 DIAGNOSIS — J9601 Acute respiratory failure with hypoxia: Secondary | ICD-10-CM | POA: Diagnosis not present

## 2021-03-01 DIAGNOSIS — J90 Pleural effusion, not elsewhere classified: Secondary | ICD-10-CM | POA: Diagnosis not present

## 2021-03-01 DIAGNOSIS — L89322 Pressure ulcer of left buttock, stage 2: Secondary | ICD-10-CM | POA: Diagnosis not present

## 2021-03-01 NOTE — Progress Notes (Signed)
I connected with Adam Briggs today by telephone and verified that I am speaking with the correct person using two identifiers. Location patient: home Location provider: work Persons participating in the virtual visit: patient, provider.   I discussed the limitations, risks, security and privacy concerns of performing an evaluation and management service by telephone and the availability of in person appointments. I also discussed with the patient that there may be a patient responsible charge related to this service. The patient expressed understanding and verbally consented to this telephonic visit.    Interactive audio and video telecommunications were attempted between this provider and patient, however failed, due to patient having technical difficulties OR patient did not have access to video capability.  We continued and completed visit with audio only.  Some vital signs may be absent or patient reported.   Time Spent with patient on telephone encounter: 40 minutes  Subjective:   Adam Briggs is a 87 y.o. male who presents for Medicare Annual/Subsequent preventive examination.  Review of Systems     Cardiac Risk Factors include: advanced age (>47men, >68 women);family history of premature cardiovascular disease;hypertension;dyslipidemia;male gender     Objective:    There were no vitals filed for this visit. There is no height or weight on file to calculate BMI.  Advanced Directives 03/01/2021 01/08/2021 01/08/2021 10/31/2020 10/17/2020 09/15/2020 07/15/2014  Does Patient Have a Medical Advance Directive? No No No No No No No  Does patient want to make changes to medical advance directive? No - Patient declined - - - - - -  Would patient like information on creating a medical advance directive? - No - Patient declined - No - Patient declined No - Patient declined No - Guardian declined No - patient declined information    Current Medications (verified) Outpatient Encounter  Medications as of 03/01/2021  Medication Sig   apixaban (ELIQUIS) 5 MG TABS tablet Take 1 tablet (5 mg total) by mouth 2 (two) times daily.   calcium-vitamin D (OSCAL WITH D) 500-200 MG-UNIT tablet Take 1 tablet by mouth daily with breakfast.   fluticasone (FLONASE) 50 MCG/ACT nasal spray Place 2 sprays into both nostrils daily.   furosemide (LASIX) 20 MG tablet Take 1 tablet (20 mg total) by mouth daily.   levalbuterol (XOPENEX HFA) 45 MCG/ACT inhaler Inhale 1 puff into the lungs every 4 (four) hours as needed for wheezing or shortness of breath.   metoprolol tartrate (LOPRESSOR) 25 MG tablet Take 1 tablet (25 mg total) by mouth 2 (two) times daily.   Multiple Vitamin (MULTIVITAMIN WITH MINERALS) TABS tablet Take 1 tablet by mouth in the morning. Centrum Silver for Men 50+   Polyethylene Glycol 3350 (MIRALAX PO) Take 17 g by mouth daily as needed (constipation.).   senna-docusate (SENOKOT-S) 8.6-50 MG tablet Take 1 tablet by mouth at bedtime as needed for mild constipation or moderate constipation. (Patient taking differently: Take 1 tablet by mouth at bedtime.)   simvastatin (ZOCOR) 20 MG tablet Take 1 tablet (20 mg total) by mouth daily at 6 PM.   spironolactone (ALDACTONE) 50 MG tablet Take 1 tablet (50 mg total) by mouth daily.   tamsulosin (FLOMAX) 0.4 MG CAPS capsule Take 0.4 mg by mouth in the morning.   No facility-administered encounter medications on file as of 03/01/2021.    Allergies (verified) Lisinopril   History: Past Medical History:  Diagnosis Date   Atrial fibrillation (HCC)    BENIGN PROSTATIC HYPERTROPHY    Cataract    DIVERTICULOSIS, COLON  DM    not diabetic-has been off metformin for about 2 years as of 09/2020 pr daughter   GERD    Heart murmur    Hernia    HYPERCHOLESTEROLEMIA    HYPERTENSION    Lung cancer ([place])    Moderate aortic stenosis 10/30/2011   echo 2013   Prostate cancer [facility])    s/p seed implant   Recurrent right pleural effusion    Past  Surgical History:  Procedure Laterality Date   BRONCHIAL BIOPSY  10/17/2020   Procedure: BRONCHIAL BIOPSIES;  Surgeon: Garner Nash, DO;  Location: [place] ENDOSCOPY;  Service: Pulmonary;;   BRONCHIAL BRUSHINGS  10/17/2020   Procedure: BRONCHIAL BRUSHINGS;  Surgeon: Garner Nash, DO;  Location: [place] ENDOSCOPY;  Service: Pulmonary;;   BRONCHIAL NEEDLE ASPIRATION BIOPSY  10/17/2020   Procedure: BRONCHIAL NEEDLE ASPIRATION BIOPSIES;  Surgeon: Garner Nash, DO;  Location: [place] ENDOSCOPY;  Service: Pulmonary;;   BRONCHIAL WASHINGS  10/17/2020   Procedure: BRONCHIAL WASHINGS;  Surgeon: Garner Nash, DO;  Location: [place] ENDOSCOPY;  Service: Pulmonary;;   CATARACT EXTRACTION W/ INTRAOCULAR LENS IMPLANT  02/2013   R, then L 4 weeks later - [place]   FIDUCIAL MARKER PLACEMENT  10/17/2020   Procedure: FIDUCIAL MARKER PLACEMENT;  Surgeon: Garner Nash, DO;  Location: [place] ENDOSCOPY;  Service: Pulmonary;;   INGUINAL HERNIA REPAIR     Right   INGUINAL HERNIA REPAIR Left 07/15/2014   Procedure: LEFT INGUINAL HERNIA REPAIR WITH MESH;  Surgeon: Armandina Gemma, MD;  Location: [facility];  Service: General;  Laterality: Left;   INSERTION OF MESH Left 07/15/2014   Procedure: INSERTION OF MESH;  Surgeon: Armandina Gemma, MD;  Location: [facility];  Service: General;  Laterality: Left;   RADIOACTIVE SEED IMPLANT     THORACENTESIS N/A 01/05/2021   Procedure: THORACENTESIS;  Surgeon: Candee Furbish, MD;  Location: [facility] ENDOSCOPY;  Service: Pulmonary;  Laterality: N/A;   VIDEO BRONCHOSCOPY WITH ENDOBRONCHIAL NAVIGATION Left 10/17/2020   Procedure: VIDEO BRONCHOSCOPY WITH ENDOBRONCHIAL NAVIGATION;  Surgeon: Garner Nash, DO;  Location: [place];  Service: Pulmonary;  Laterality: Left;   VIDEO BRONCHOSCOPY WITH ENDOBRONCHIAL ULTRASOUND Bilateral 10/17/2020   Procedure: VIDEO BRONCHOSCOPY WITH ENDOBRONCHIAL ULTRASOUND;  Surgeon: Garner Nash, DO;  Location: [place];  Service: Pulmonary;  Laterality: Bilateral;    Family History  Problem Relation Age of Onset   Heart attack Paternal Uncle        in his 30s   Atrial fibrillation Daughter    Colon cancer Neg Hx    Social History   Socioeconomic History   Marital status: Widowed    Spouse name: Not on file   Number of children: 2   Years of education: Not on file   Highest education level: Not on file  Occupational History   Occupation: Retired     Comment: Retired  Tobacco Use   Smoking status: Former    Packs/day: 0.50    Years: 35.00    Pack years: 17.50    Types: Cigarettes    Quit date: 10/30/1966    Years since quitting: 54.3   Smokeless tobacco: Never  Vaping Use   Vaping Use: Never used  Substance and Sexual Activity   Alcohol use: No   Drug use: No   Sexual activity: Not on file  Other Topics Concern   Not on file  Social History Narrative   Pt says his diet is excellent   Daily caffeine    Social Determinants  of Health   Financial Resource Strain: Low Risk    Difficulty of Paying Living Expenses: Not hard at all  Food Insecurity: No Food Insecurity   Worried About [place] in the Last Year: Never true   [place] in the Last Year: Never true  Transportation Needs: No Transportation Needs   Lack of Transportation (Medical): No   Lack of Transportation (Non-Medical): No  Physical Activity: Inactive   Days of Exercise per Week: 0 days   Minutes of Exercise per Session: 0 min  Stress: No Stress Concern Present   Feeling of Stress : Not at all  Social Connections: Socially Isolated   Frequency of Communication with Friends and Family: More than three times a week   Frequency of Social Gatherings with Friends and Family: Once a week   Attends Religious Services: Never   Marine scientist or Organizations: No   Attends Archivist Meetings: Never   Marital Status: Widowed    Tobacco Counseling Counseling given: Not Answered   Clinical Intake:  Pre-visit preparation  completed: Yes  Pain : No/denies pain     Nutritional Risks: None Diabetes: No  How often do you need to have someone help you when you read instructions, pamphlets, or other written materials from your doctor or pharmacy?: 1 - Never What is the last grade level you completed in school?: 7th grade  Diabetic? no  Interpreter Needed?: No  Information entered by :: Lisette Abu, LPN   Activities of Daily Living In your present state of health, do you have any difficulty performing the following activities: 03/01/2021 01/08/2021  Hearing? N Y  Comment - feels like something is down in his right ear  Vision? N N  Difficulty concentrating or making decisions? N N  Walking or climbing stairs? N N  Dressing or bathing? N N  Doing errands, shopping? N Y  Comment - doesn't do due to [employer] and eating ? N -  Using the Toilet? N -  In the past six months, have you accidently leaked urine? N -  Do you have problems with loss of bowel control? N -  Managing your Medications? N -  Managing your Finances? N -  Housekeeping or managing your Housekeeping? N -  Some recent data might be hidden    Patient Care Team: [name], MD as PCP - General (Internal Medicine) Minus Breeding, MD as PCP - Cardiology (Cardiology) Renato Shin, MD (Endocrinology) Irene Shipper, MD (Gastroenterology) Carolan Clines, MD (Inactive) (Urology) Minus Breeding, MD (Cardiology) Szabat, Darnelle Maffucci, [facility] (Pharmacist) Delice Bison Darnelle Maffucci, [facility] as Pharmacist (Pharmacist)  Indicate any recent Medical Services you may have received from other than [facility] providers in the past year (date may be approximate).     Assessment:   This is a routine wellness examination for [place].  Hearing/Vision screen Hearing Screening - Comments:: Patient denied any hearing difficulty.   No hearing aids.  Vision Screening - Comments:: Patient wears readers.  Eye exam done annually by: [facility]   Dietary issues and exercise activities discussed: Current Exercise Habits: The patient does not participate in regular exercise at present, Exercise limited by: respiratory conditions(s);cardiac condition(s)   Goals Addressed   None   Depression Screen PHQ 2/9 Scores 03/01/2021 03/01/2021 11/27/2020 11/26/2019 11/19/2018 02/10/2017 07/14/2015  PHQ - 2 Score 0 0 0 0 0 0 0    Fall Risk Fall Risk  03/01/2021 11/27/2020 11/26/2019  11/19/2018 02/10/2017  Falls in the past year? 0 0 0 0 No  Comment - - - - -  Number falls in past yr: 0 0 - - -  Injury with Fall? 0 0 - - -  Risk for fall due to : No Fall Risks - - - -  Follow up Falls evaluation completed - - - -    FALL RISK PREVENTION PERTAINING TO THE HOME:  Any stairs in or around the home? No  If so, are there any without handrails? No  Home free of loose throw rugs in walkways, pet beds, electrical cords, etc? Yes  Adequate lighting in your home to reduce risk of falls? Yes   ASSISTIVE DEVICES UTILIZED TO PREVENT FALLS:  Life alert? No  Use of a cane, walker or w/c? No  Grab bars in the bathroom? Yes  Shower chair or bench in shower? Yes  Elevated toilet seat or a handicapped toilet? Yes   TIMED UP AND GO:  Was the test performed? No .  Length of time to ambulate 10 feet: n/a sec.   Gait slow and steady without use of assistive device  Cognitive Function: Normal cognitive status assessed by direct observation by this Nurse Health Advisor. No abnormalities found.          Immunizations Immunization History  Administered Date(s) Administered   Fluad Quad(high Dose 65+) 03/05/2019, 04/07/2020   Influenza Split 03/23/2012   Influenza Whole 02/17/2009, 03/26/2010   Influenza, High Dose Seasonal PF 04/16/2013, 03/22/2014, 01/12/2015, 02/10/2017, 03/20/2018   Influenza,inj,Quad PF,6+ Mos 01/11/2016   PFIZER Comirnaty(Gray Top)Covid-19 Tri-Sucrose Vaccine 02/18/2020   PFIZER(Purple Top)SARS-COV-2 Vaccination  06/05/2019, 06/26/2019   Pneumococcal Conjugate-13 01/12/2015   Pneumococcal Polysaccharide-23 05/23/2003   Td 10/14/2012    TDAP status: Up to date  Flu Vaccine status: Due, Education has been provided regarding the importance of this vaccine. Advised may receive this vaccine at local pharmacy or Health Dept. Aware to provide a copy of the vaccination record if obtained from local pharmacy or Health Dept. Verbalized acceptance and understanding.  Pneumococcal vaccine status: Up to date  Covid-19 vaccine status: Completed vaccines  Qualifies for Shingles Vaccine? Yes   Zostavax completed No   Shingrix Completed?: No.    Education has been provided regarding the importance of this vaccine. Patient has been advised to call insurance company to determine out of pocket expense if they have not yet received this vaccine. Advised may also receive vaccine at local pharmacy or Health Dept. Verbalized acceptance and understanding.  Screening Tests Health Maintenance  Topic Date Due   Zoster Vaccines- Shingrix (1 of 2) Never done   URINE MICROALBUMIN  10/20/2014   OPHTHALMOLOGY EXAM  02/18/2015   COVID-19 Vaccine (4 - Booster for Pfizer series) 05/12/2020   FOOT EXAM  11/25/2020   INFLUENZA VACCINE  08/17/2021 (Originally 12/18/2020)   HEMOGLOBIN A1C  03/17/2021   TETANUS/TDAP  10/15/2022   HPV VACCINES  Aged Out    Health Maintenance  Health Maintenance Due  Topic Date Due   Zoster Vaccines- Shingrix (1 of 2) Never done   URINE MICROALBUMIN  10/20/2014   OPHTHALMOLOGY EXAM  02/18/2015   COVID-19 Vaccine (4 - Booster for Pfizer series) 05/12/2020   FOOT EXAM  11/25/2020    Colorectal cancer screening: No longer required.   Lung Cancer Screening: (Low Dose CT Chest recommended if Age 74-80 years, 30 pack-year currently smoking OR have quit w/in 15years.) does not qualify.   Lung Cancer Screening Referral:  no  Additional Screening:  Hepatitis C Screening: does not qualify;  Completed no  Vision Screening: Recommended annual ophthalmology exams for early detection of glaucoma and other disorders of the eye. Is the patient up to date with their annual eye exam?  Yes  Who is the provider or what is the name of the office in which the patient attends annual eye exams? [facility] If pt is not established with a provider, would they like to be referred to a provider to establish care? No .   Dental Screening: Recommended annual dental exams for proper oral hygiene  Community Resource Referral / Chronic Care Management: CRR required this visit?  No   CCM required this visit?  No      Plan:     I have personally reviewed and noted the following in the patient's chart:   Medical and social history Use of alcohol, tobacco or illicit drugs  Current medications and supplements including opioid prescriptions. Patient is not currently taking opioid prescriptions. Functional ability and status Nutritional status Physical activity Advanced directives List of other physicians Hospitalizations, surgeries, and ER visits in previous 12 months Vitals Screenings to include cognitive, depression, and falls Referrals and appointments  In addition, I have reviewed and discussed with patient certain preventive protocols, quality metrics, and best practice recommendations. A written personalized care plan for preventive services as well as general preventive health recommendations were provided to patient.     Sheral Flow, LPN   81/77/1165   Nurse Notes:  Patient is cogitatively intact. There were no vitals filed for this visit. There is no height or weight on file to calculate BMI.

## 2021-03-01 NOTE — Patient Instructions (Signed)
Adam Briggs , Thank you for taking time to come for your Medicare Wellness Visit. I appreciate your ongoing commitment to your health goals. Please review the following plan we discussed and let me know if I can assist you in the future.   Screening recommendations/referrals: Colonoscopy: Not a candidate for screening due to age Recommended yearly ophthalmology/optometry visit for glaucoma screening and checkup Recommended yearly dental visit for hygiene and checkup  Vaccinations: Influenza vaccine: scheduled for 03/10/2021 Pneumococcal vaccine: 05/23/2003, 01/12/2015 Tdap vaccine: 10/14/2012; due every 10 years Shingles vaccine: never done   Covid-19: 06/05/2019, 06/26/2019, 02/18/2020  Advanced directives: Advance directive discussed with you today. Even though you declined this today please call our office should you change your mind and we can give you the proper paperwork for you to fill out.  Conditions/risks identified: Yes; no goals at this time.  Next appointment: Please schedule your next Medicare Wellness Visit with your Nurse Health Advisor in 1 year by calling [phone].  Preventive Care 9 Years and Older, Male Preventive care refers to lifestyle choices and visits with your health care provider that can promote health and wellness. What does preventive care include? A yearly physical exam. This is also called an annual well check. Dental exams once or twice a year. Routine eye exams. Ask your health care provider how often you should have your eyes checked. Personal lifestyle choices, including: Daily care of your teeth and gums. Regular physical activity. Eating a healthy diet. Avoiding tobacco and drug use. Limiting alcohol use. Practicing safe sex. Taking low doses of aspirin every day. Taking vitamin and mineral supplements as recommended by your health care provider. What happens during an annual well check? The services and screenings done by your health care  provider during your annual well check will depend on your age, overall health, lifestyle risk factors, and family history of disease. Counseling  Your health care provider may ask you questions about your: Alcohol use. Tobacco use. Drug use. Emotional well-being. Home and relationship well-being. Sexual activity. Eating habits. History of falls. Memory and ability to understand (cognition). Work and work Statistician. Screening  You may have the following tests or measurements: Height, weight, and BMI. Blood pressure. Lipid and cholesterol levels. These may be checked every 5 years, or more frequently if you are over 2 years old. Skin check. Lung cancer screening. You may have this screening every year starting at age 62 if you have a 30-pack-year history of smoking and currently smoke or have quit within the past 15 years. Fecal occult blood test (FOBT) of the stool. You may have this test every year starting at age 69. Flexible sigmoidoscopy or colonoscopy. You may have a sigmoidoscopy every 5 years or a colonoscopy every 10 years starting at age 48. Prostate cancer screening. Recommendations will vary depending on your family history and other risks. Hepatitis C blood test. Hepatitis B blood test. Sexually transmitted disease (STD) testing. Diabetes screening. This is done by checking your blood sugar (glucose) after you have not eaten for a while (fasting). You may have this done every 1-3 years. Abdominal aortic aneurysm (AAA) screening. You may need this if you are a current or former smoker. Osteoporosis. You may be screened starting at age 42 if you are at high risk. Talk with your health care provider about your test results, treatment options, and if necessary, the need for more tests. Vaccines  Your health care provider may recommend certain vaccines, such as: Influenza vaccine. This is recommended every  year. Tetanus, diphtheria, and acellular pertussis (Tdap, Td)  vaccine. You may need a Td booster every 10 years. Zoster vaccine. You may need this after age 26. Pneumococcal 13-valent conjugate (PCV13) vaccine. One dose is recommended after age 70. Pneumococcal polysaccharide (PPSV23) vaccine. One dose is recommended after age 47. Talk to your health care provider about which screenings and vaccines you need and how often you need them. This information is not intended to replace advice given to you by your health care provider. Make sure you discuss any questions you have with your health care provider. Document Released: 06/02/2015 Document Revised: 01/24/2016 Document Reviewed: 03/07/2015 Elsevier Interactive Patient Education  2017 [place] Prevention in the Home Falls can cause injuries. They can happen to people of all ages. There are many things you can do to make your home safe and to help prevent falls. What can I do on the outside of my home? Regularly fix the edges of walkways and driveways and fix any cracks. Remove anything that might make you trip as you walk through a door, such as a raised step or threshold. Trim any bushes or trees on the path to your home. Use bright outdoor lighting. Clear any walking paths of anything that might make someone trip, such as rocks or tools. Regularly check to see if handrails are loose or broken. Make sure that both sides of any steps have handrails. Any raised decks and porches should have guardrails on the edges. Have any leaves, snow, or ice cleared regularly. Use sand or salt on walking paths during winter. Clean up any spills in your garage right away. This includes oil or grease spills. What can I do in the bathroom? Use night lights. Install grab bars by the toilet and in the tub and shower. Do not use towel bars as grab bars. Use non-skid mats or decals in the tub or shower. If you need to sit down in the shower, use a plastic, non-slip stool. Keep the floor dry. Clean up any  water that spills on the floor as soon as it happens. Remove soap buildup in the tub or shower regularly. Attach bath mats securely with double-sided non-slip rug tape. Do not have throw rugs and other things on the floor that can make you trip. What can I do in the bedroom? Use night lights. Make sure that you have a light by your bed that is easy to reach. Do not use any sheets or blankets that are too big for your bed. They should not hang down onto the floor. Have a firm chair that has side arms. You can use this for support while you get dressed. Do not have throw rugs and other things on the floor that can make you trip. What can I do in the kitchen? Clean up any spills right away. Avoid walking on wet floors. Keep items that you use a lot in easy-to-reach places. If you need to reach something above you, use a strong step stool that has a grab bar. Keep electrical cords out of the way. Do not use floor polish or wax that makes floors slippery. If you must use wax, use non-skid floor wax. Do not have throw rugs and other things on the floor that can make you trip. What can I do with my stairs? Do not leave any items on the stairs. Make sure that there are handrails on both sides of the stairs and use them. Fix handrails that are broken or  loose. Make sure that handrails are as long as the stairways. Check any carpeting to make sure that it is firmly attached to the stairs. Fix any carpet that is loose or worn. Avoid having throw rugs at the top or bottom of the stairs. If you do have throw rugs, attach them to the floor with carpet tape. Make sure that you have a light switch at the top of the stairs and the bottom of the stairs. If you do not have them, ask someone to add them for you. What else can I do to help prevent falls? Wear shoes that: Do not have high heels. Have rubber bottoms. Are comfortable and fit you well. Are closed at the toe. Do not wear sandals. If you use a  stepladder: Make sure that it is fully opened. Do not climb a closed stepladder. Make sure that both sides of the stepladder are locked into place. Ask someone to hold it for you, if possible. Clearly mark and make sure that you can see: Any grab bars or handrails. First and last steps. Where the edge of each step is. Use tools that help you move around (mobility aids) if they are needed. These include: Canes. Walkers. Scooters. Crutches. Turn on the lights when you go into a dark area. Replace any light bulbs as soon as they burn out. Set up your furniture so you have a clear path. Avoid moving your furniture around. If any of your floors are uneven, fix them. If there are any pets around you, be aware of where they are. Review your medicines with your doctor. Some medicines can make you feel dizzy. This can increase your chance of falling. Ask your doctor what other things that you can do to help prevent falls. This information is not intended to replace advice given to you by your health care provider. Make sure you discuss any questions you have with your health care provider. Document Released: 03/02/2009 Document Revised: 10/12/2015 Document Reviewed: 06/10/2014 Elsevier Interactive Patient Education  2017 [employer].

## 2021-03-04 LAB — ACID FAST CULTURE WITH REFLEXED SENSITIVITIES (MYCOBACTERIA): Acid Fast Culture: NEGATIVE

## 2021-03-08 ENCOUNTER — Telehealth: Payer: Self-pay | Admitting: Internal Medicine

## 2021-03-08 DIAGNOSIS — J948 Other specified pleural conditions: Secondary | ICD-10-CM | POA: Diagnosis not present

## 2021-03-08 DIAGNOSIS — J869 Pyothorax without fistula: Secondary | ICD-10-CM | POA: Diagnosis not present

## 2021-03-08 DIAGNOSIS — K7460 Unspecified cirrhosis of liver: Secondary | ICD-10-CM | POA: Diagnosis not present

## 2021-03-08 DIAGNOSIS — L89322 Pressure ulcer of left buttock, stage 2: Secondary | ICD-10-CM | POA: Diagnosis not present

## 2021-03-08 DIAGNOSIS — J9601 Acute respiratory failure with hypoxia: Secondary | ICD-10-CM | POA: Diagnosis not present

## 2021-03-08 DIAGNOSIS — J90 Pleural effusion, not elsewhere classified: Secondary | ICD-10-CM | POA: Diagnosis not present

## 2021-03-08 NOTE — Telephone Encounter (Signed)
The kidney function on this lab is stable from before with creatinine 1.3. Are there any additional questions?

## 2021-03-08 NOTE — Telephone Encounter (Signed)
Called patient's daughter. LVM asking her to give our office a call back to discuss her father's results. Office number was provided. Mychart message was sent as well.

## 2021-03-08 NOTE — Telephone Encounter (Signed)
Results are in your box for review

## 2021-03-08 NOTE — Telephone Encounter (Signed)
Patient's daughter [name] called regarding last lab reports from [facility] sent over lab results and some of the levels were abnormal. She would like to discuss the labs further.   Best contact #: [phone]

## 2021-03-10 ENCOUNTER — Ambulatory Visit (INDEPENDENT_AMBULATORY_CARE_PROVIDER_SITE_OTHER): Payer: Medicare Other

## 2021-03-10 ENCOUNTER — Other Ambulatory Visit: Payer: Self-pay

## 2021-03-10 DIAGNOSIS — Z23 Encounter for immunization: Secondary | ICD-10-CM

## 2021-03-19 ENCOUNTER — Other Ambulatory Visit: Payer: Self-pay

## 2021-03-19 ENCOUNTER — Ambulatory Visit (INDEPENDENT_AMBULATORY_CARE_PROVIDER_SITE_OTHER): Payer: Medicare Other

## 2021-03-19 DIAGNOSIS — I10 Essential (primary) hypertension: Secondary | ICD-10-CM

## 2021-03-19 DIAGNOSIS — I4891 Unspecified atrial fibrillation: Secondary | ICD-10-CM | POA: Diagnosis not present

## 2021-03-19 NOTE — Patient Instructions (Signed)
Adam Briggs,  It was great to talk to you today!  Please call me with any questions or concerns.  Visit Information  PATIENT GOALS:  Goals Addressed             This Visit's Progress    Track and Manage My Blood Pressure-Hypertension/ Afib   On track    Timeframe:  Long-Range Goal Priority:  High Start Date:  10/25/2020                           Expected End Date:  04/26/2021                     Follow Up Date 01/25/2021   - check blood pressure weekly - write blood pressure results in a log or diary    Why is this important?   You won't feel high blood pressure, but it can still hurt your blood vessels.  High blood pressure can cause heart or kidney problems. It can also cause a stroke.  Making lifestyle changes like losing a little weight or eating less salt will help.  Checking your blood pressure at home and at different times of the day can help to control blood pressure.  If the doctor prescribes medicine remember to take it the way the doctor ordered.  Call the office if you cannot afford the medicine or if there are questions about it.     Notes: Patient will reach out to clinic should BP become uncontrolled (>130/80) / HR become uncontrolled (HR >100 or <60 bpm)        Patient verbalizes understanding of instructions provided today and agrees to view in [place].   Telephone follow up appointment with care management team member scheduled for: 3 months The patient has been provided with contact information for the care management team and has been advised to call with any health related questions or concerns.   Tomasa Blase, PharmD Clinical Pharmacist, [place]

## 2021-03-19 NOTE — Progress Notes (Signed)
Chronic Care Management Pharmacy Note  03/19/2021 Name:  Adam Briggs MRN:  [id number] DOB:  1930-03-01  Subjective: Adam Briggs is an 87 y.o. year old male who is a primary patient of [name], MD.  The CCM team was consulted for assistance with disease management and care coordination needs.   Summary: -Spoke with daughter [name] who manages patient's medications - reports that patient has been stable since last appointment, no changes in medications recently, continues on furosemide 26m daily and spironolactone 548mdaily  -Patient's breathing has been well controlled, has picked up xopenex inhaler but has not had to use as of this time  Recommendations/Changes made from today's visit: - continue current medications, reach out to office with any medication issues/ concerns   Engaged with patient by telephone for follow up visit in response to provider referral for pharmacy case management and/or care coordination services.   Consent to Services:  The patient was given the following information about Chronic Care Management services today, agreed to services, and gave verbal consent: 1. CCM service includes personalized support from designated clinical staff supervised by the primary care provider, including individualized plan of care and coordination with other care providers 2. 24/7 contact phone numbers for assistance for urgent and routine care needs. 3. Service will only be billed when office clinical staff spend 20 minutes or more in a month to coordinate care. 4. Only one practitioner may furnish and bill the service in a calendar month. 5.The patient may stop CCM services at any time (effective at the end of the month) by phone call to the office staff. 6. The patient will be responsible for cost sharing (co-pay) of up to 20% of the service fee (after annual deductible is met). Patient agreed to services and consent obtained.  Patient Care Team: CrHoyt KochMD as PCP - General (Internal Medicine) HoMinus BreedingMD as PCP - Cardiology (Cardiology) ElRenato ShinMD (Endocrinology) PeIrene ShipperMD (Gastroenterology) TaCarolan ClinesMD (Inactive) (Urology) HoMinus BreedingMD (Cardiology) SzDelice BisonaDarnelle MaffucciRPSt Louis Specialty Surgical CenterPharmacist) SzTomasa BlaseRPBradford Regional Medical Centers Pharmacist (Pharmacist)  Recent office visits:  02/02/21 - Dr. CrSharlet Salina hospital follow up - valretex started for possible shingles     Recent consult visits:  01/29/21 - Dr. HoPercival Spanish cardiology - no medication changes - f/u in 3 months    Hospital visits:  Medication Reconciliation was completed by comparing discharge summary, patient's EMR and Pharmacy list, and upon discussion with patient.   Admitted to the hospital on 01/08/21 due to acute hypoxic respiratory failure/ Loculated right lateral pneumothorax. Discharge date was 01/16/21. [facility] -started levalbuterol 45 Mcg and sprinolactone 50 mg    Medication Changes at Hospital Discharge: -Changed furosemide 20 mg    Medications Discontinued at Hospital Discharge: -Stopped albuterol 108 Mcg   Medications that remain the same after Hospital Discharge:??  -All other medications will remain the same.    [facility]   Objective:  Lab Results  Component Value Date   CREATININE 1.32 02/02/2021   BUN 31 (H) 02/02/2021   GFR 47.77 (L) 02/02/2021   GFRNONAA >60 01/15/2021   GFRAA 68 (L) 07/11/2014   NA 139 02/02/2021   K 4.7 02/02/2021   CALCIUM 10.0 02/02/2021   CO2 37 (H) 02/02/2021   GLUCOSE 127 (H) 02/02/2021    Lab Results  Component Value Date/Time  HGBA1C 6.2 09/15/2020 11:32 AM   HGBA1C 6.0 (H) 11/26/2019 03:49 PM   GFR 47.77 (L) 02/02/2021 11:16 AM   GFR 64.41 11/27/2020 11:15 AM   MICROALBUR 20.0 (H) 10/19/2013 10:59 AM   MICROALBUR 13.8 (H) 10/14/2012 10:28 AM    Last diabetic Eye exam:  Lab  Results  Component Value Date/Time   HMDIABEYEEXA done - dr Christel Mormon - no DM retinopathy 02/17/2013 12:00 AM    Last diabetic Foot exam:  No results found for: HMDIABFOOTEX   Lab Results  Component Value Date   CHOL 117 09/19/2020   HDL 36 (L) 09/19/2020   LDLCALC 72 09/19/2020   LDLDIRECT 110.2 12/28/2007   TRIG 43 09/19/2020   CHOLHDL 3.3 09/19/2020    Hepatic Function Latest Ref Rng & Units 02/02/2021 01/08/2021 11/27/2020  Total Protein 6.0 - 8.3 g/dL 6.3 6.7 6.2  Albumin 3.5 - 5.2 g/dL 3.6 3.4(L) 3.6  AST 0 - 37 U/L _0 ALT 0 - 53 U/L _1 Alk Phosphatase 39 - 117 U/L 55 57 56  Total Bilirubin 0.2 - 1.2 mg/dL 0.7 0.8 0.6  Bilirubin, Direct 0.0 - 0.3 mg/dL - - -    Lab Results  Component Value Date/Time   TSH 3.642 09/18/2020 05:20 PM   TSH 2.85 11/19/2018 09:22 AM   TSH 2.85 10/14/2012 10:28 AM    CBC Latest Ref Rng & Units 02/02/2021 01/15/2021 01/13/2021  WBC 4.0 - 10.5 K/uL 6.2 5.5 5.1  Hemoglobin 13.0 - 17.0 g/dL 11.7(L) 11.3(L) 10.9(L)  Hematocrit 39.0 - 52.0 % 37.2(L) 38.0(L) 37.8(L)  Platelets 150.0 - 400.0 K/uL 161.0 188 174    No results found for: VD25OH  Clinical ASCVD: No  The ASCVD Risk score (Arnett DK, et al., 2019) failed to calculate for the following reasons:   The 2019 ASCVD risk score is only valid for ages 66 to 60    Depression screen PHQ 2/9 03/01/2021 03/01/2021 11/27/2020  Decreased Interest 0 0 0  Down, Depressed, Hopeless 0 0 0  PHQ - 2 Score 0 0 0     Social History   Tobacco Use  Smoking Status Former   Packs/day: 0.50   Years: 35.00   Pack years: 17.50   Types: Cigarettes   Quit date: 10/30/1966   Years since quitting: 54.4  Smokeless Tobacco Never   BP Readings from Last 3 Encounters:  02/02/21 126/76  01/29/21 122/72  01/16/21 119/72   Pulse Readings from Last 3 Encounters:  02/02/21 97  01/29/21 63  01/16/21 (!) 106   Wt Readings from Last 3 Encounters:  02/02/21 153 lb 9.6 oz (69.7 kg)  01/29/21 155  lb 3.2 oz (70.4 kg)  01/08/21 160 lb (72.6 kg)   BMI Readings from Last 3 Encounters:  02/02/21 22.04 kg/m  01/29/21 22.27 kg/m  01/08/21 22.96 kg/m    Assessment/Interventions: Review of patient past medical history, allergies, medications, health status, including review of consultants reports, laboratory and other test data, was performed as part of comprehensive evaluation and provision of chronic care management services.   SDOH:  (Social Determinants of Health) assessments and interventions performed: Yes  SDOH Screenings   Alcohol Screen: Low Risk    Last Alcohol Screening Score (AUDIT): 0  Depression (PHQ2-9): Low Risk    PHQ-2 Score: 0  Financial Resource Strain: Low Risk    Difficulty of Paying Living Expenses: Not hard at all  Food Insecurity: No Food Insecurity   Worried About Running Out  of Food in the Last Year: Never true   Ran Out of Food in the Last Year: Never true  Housing: Low Risk    Last Housing Risk Score: 0  Physical Activity: Inactive   Days of Exercise per Week: 0 days   Minutes of Exercise per Session: 0 min  Social Connections: Socially Isolated   Frequency of Communication with Friends and Family: More than three times a week   Frequency of Social Gatherings with Friends and Family: Once a week   Attends Religious Services: Never   Marine scientist or Organizations: No   Attends Archivist Meetings: Never   Marital Status: Widowed  Stress: No Stress Concern Present   Feeling of Stress : Not at all  Tobacco Use: Medium Risk   Smoking Tobacco Use: Former   Smokeless Tobacco Use: Never   Passive Exposure: Not on file  Transportation Needs: No Transportation Needs   Lack of Transportation (Medical): No   Lack of Transportation (Non-Medical): No    CCM Care Plan  Allergies  Allergen Reactions   Lisinopril Rash    Medications Reviewed Today     Reviewed by Tomasa Blase, [facility] (Pharmacist) on 03/19/21 at [facility]  Status: <None>   Medication Order Taking? Sig Documenting Provider Last Dose Status Informant  apixaban (ELIQUIS) 5 MG TABS tablet [id number]  Take 1 tablet (5 mg total) by mouth 2 (two) times daily. [name], MD  Active   calcium-vitamin D Darron Doom WITH D) 500-200 MG-UNIT tablet [id number] No Take 1 tablet by mouth daily with breakfast. [provider] Taking Active   fluticasone (FLONASE) 50 MCG/ACT nasal spray [id number] No Place 2 sprays into both nostrils daily. [name], MD Taking Active Family Member  furosemide (LASIX) 20 MG tablet [id number] No Take 1 tablet (20 mg total) by mouth daily. Mendel Corning, MD Taking Active   levalbuterol [facility] HFA) 45 MCG/ACT inhaler [id number] No Inhale 1 puff into the lungs every 4 (four) hours as needed for wheezing or shortness of breath. [name], MD Taking Active   metoprolol tartrate (LOPRESSOR) 25 MG tablet [id number] No Take 1 tablet (25 mg total) by mouth 2 (two) times daily. [name], MD Taking Active Self  Multiple Vitamin (MULTIVITAMIN WITH MINERALS) TABS tablet [id number] No Take 1 tablet by mouth in the morning. Centrum Silver for Men 50+ [provider] Taking Active Self  Polyethylene Glycol 3350 (MIRALAX PO) [id number] No Take 17 g by mouth daily as needed (constipation.). [provider] Taking Active Self  senna-docusate (SENOKOT-S) 8.6-50 MG tablet [id number] No Take 1 tablet by mouth at bedtime as needed for mild constipation or moderate constipation.  Patient taking differently: Take 1 tablet by mouth at bedtime.   Damita Lack, MD Taking Active   simvastatin (ZOCOR) 20 MG tablet [id number] No Take 1 tablet (20 mg total) by mouth daily at 6 PM. [name], MD Taking Active Self  spironolactone (ALDACTONE) 50 MG tablet [id number] No Take 1 tablet (50 mg total) by mouth daily. Rai, Vernelle Emerald, MD Taking Active   tamsulosin (FLOMAX) 0.4 MG CAPS capsule  [id number] No Take 0.4 mg by mouth in the morning. [provider] Taking Active Self            Patient Active Problem List   Diagnosis Date Noted   Rash 02/02/2021   Pressure injury of skin 01/14/2021   Pleural effusion, left 01/09/2021  Liver lesion 01/09/2021   Other cirrhosis of liver ([place]) 01/09/2021   Loculated right lateral pneumothorax 01/08/2021   Leg swelling 11/27/2020   Malignant neoplasm of upper lobe of left lung ([place]) 10/31/2020   Recurrent right pleural effusion    Atrial fibrillation (H[facility])    S/P thoracentesis    SOB (shortness of breath) 09/15/2020   Pleural effusion, right 09/15/2020   Acute hypoxemic respiratory failure ([place]) 09/15/2020   Atrial fibrillation, new onset ([place]) 09/15/2020   Wheezing 04/24/2019   Routine general medical examination at a health care facility 06/23/2014   Inguinal hernia 05/05/2014   Moderate aortic stenosis 10/30/2011   Abnormal ECG 09/24/2010   Type 2 diabetes mellitus with hyperlipidemia ([place]) 03/26/2010   Hyperlipidemia associated with type 2 diabetes mellitus ([place]) 12/28/2007   Essential hypertension 02/07/2007    Immunization History  Administered Date(s) Administered   Fluad Quad(high Dose 65+) 03/05/2019, 04/07/2020, 03/10/2021   Influenza Split 03/23/2012   Influenza Whole 02/17/2009, 03/26/2010   Influenza, High Dose Seasonal PF 04/16/2013, 03/22/2014, 01/12/2015, 02/10/2017, 03/20/2018   Influenza,inj,Quad PF,6+ Mos 01/11/2016   PFIZER Comirnaty(Gray Top)Covid-19 Tri-Sucrose Vaccine 02/18/2020   PFIZER(Purple Top)SARS-COV-2 Vaccination 06/05/2019, 06/26/2019   Pneumococcal Conjugate-13 01/12/2015   Pneumococcal Polysaccharide-23 05/23/2003   Td 10/14/2012    Conditions to be addressed/monitored:  Hypertension, Hyperlipidemia, Diabetes, Atrial Fibrillation and Chronic Kidney Disease  Care Plan : CCM Care Plan  Updates made by Tomasa Blase, RPH since 03/19/2021 12:00 AM     Problem: HTN,  AFib, HLD, prediabetes, CKD   Priority: High  Onset Date: 10/25/2020     Long-Range Goal: Disease Management   Start Date: 10/25/2020  Expected End Date: 03/19/2022  This Visit's Progress: On track  Recent Progress: On track  Priority: High  Note:    Current Barriers:  Unable to independently monitor therapeutic efficacy  Pharmacist Clinical Goal(s):  Patient will verbalize ability to afford treatment regimen achieve adherence to monitoring guidelines and medication adherence to achieve therapeutic efficacy maintain control of LDL and BP as evidenced by lipid panel and blood pressure logs   through collaboration with PharmD and provider.   Interventions: 1:1 collaboration with [name], MD regarding development and update of comprehensive plan of care as evidenced by provider attestation and co-signature Inter-disciplinary care team collaboration (see longitudinal plan of care) Comprehensive medication review performed; medication list updated in electronic medical record  Hyperlipidemia: (LDL goal < 100) -Controlled  Lab Results  Component Value Date   [place] 72 09/19/2020  -Current treatment: Simvastatin 48m daily  -Medications previously tried: n/a  -Current dietary patterns: reports that diet is low in sodium / fried and fatty foods  -Current exercise habits: limited at this time  -Educated on Cholesterol goals;  Benefits of statin for ASCVD risk reduction; Importance of limiting foods high in cholesterol; -Counseled on diet and exercise extensively Recommended to continue current medication  Prediabetes (A1c goal <7%) -Controlled (diet) Lab Results  Component Value Date   HGBA1C 6.2 09/15/2020  -Current medications: n/a -Medications previously tried: n/a  -Denies hypoglycemic/hyperglycemic symptoms -Current meal patterns:  breakfast: eggs, bacon, small biscuit  lunch: low sodium meats / bread  dinner: homemade meals - watching carb intake very  closely  -Current exercise: limited at this time  -Educated on A1c and blood sugar goals; Complications of diabetes including kidney damage, retinal damage, and cardiovascular disease; -Counseled to check feet daily and get yearly eye exams -Counseled on diet and exercise extensively  Atrial Fibrillation/ Hypertension (Goal:  prevent stroke/ heart attack and major bleeding / Blood pressure <130/80) -Controlled -CHADSVASC: 4 -Current treatment: Rate control: metoprolol tartrate 39m twice daily  Anticoagulation: Eliquis 544mtwice daily   Furosemide 2054maily  Spironolactone 83m59m1 tablet daily  -Medications previously tried: amlodipine, hctz, losartan  -Home BP and HR readings:daughter reports that BP has been well controlled, could not recall recent blood pressures, reports home nurse has been recording during their visits   -Counseled on increased risk of stroke due to Afib and benefits of anticoagulation for stroke prevention; importance of adherence to anticoagulant exactly as prescribed; bleeding risk associated with Eliquis and importance of self-monitoring for signs/symptoms of bleeding; avoidance of NSAIDs due to increased bleeding risk with anticoagulants; seeking medical attention after a head injury or if there is blood in the urine/stool;  -Denies hypotensive/hypertensive symptoms -Educated on BP goals and benefits of medications for prevention of heart attack, stroke and kidney damage; Daily salt intake goal < 2300 mg; Importance of home blood pressure monitoring; -Counseled to monitor BP at home weekly, document, and provide log at future appointments -Recommended to continue current medication  Shortness of Breath / Wheezing (Goal: Prevention of SOB) -Controlled -Current treatment  Levalbuterol 45mc7mt - 1 puff every 4 hours as needed - has not had to use since picking up   -Medications previously tried: Albuterol   - Will complete PA for levalbuterol inhaler    BPH (Goal: prevention of urinary symptoms/ improvement in urinary flow) -Controlled -Current treatment  Tamsulosin 0.4mg -64mcapsule daily  -Medications previously tried: n/a  -Recommended to continue current medication   Chronic Kidney Disease (Goal: Maintenance of kidney function / prevention of disease progression) -Stable  - Most recent eGFR - 47.77mL/m67mCurrent treatment  Appropriately dosed medications based on most recent kidney function tests  -Recommended to continue current medication Counseled on avoidance of use of over the counter NSAIDS   Health Maintenance -Current therapy:  Fluticasone 83mcg/a83m 2 sprays into each nostril daily  Multivitamin - 1 tablet daily  Senna-docusate 8.6-83mg - 1 tablet daily as needed for constipation - uses about once a month Miralax - 17g daily as needed for constipation - uses about once a month -Educated on Cost vs benefit of each product must be carefully weighed by individual consumer -Patient is satisfied with current therapy and denies issues -Recommended to continue current medication   Patient Goals/Self-Care Activities Patient will:  - take medications as prescribed check blood pressure weekly, document, and provide at future appointments  Follow Up Plan: Telephone follow up appointment with care management team member scheduled for: 3 months The patient has been provided with contact information for the care management team and has been advised to call with any health related questions or concerns.           Medication Assistance:  Patient reports that he applied for patient assistance for eliquis in the past but was denied, able to afford medications at this time   [facility] [place] [phone] box? Yes Pt endorses 100% compliance  Care Plan and Follow Up Patient Decision:  Patient requests no follow-up at this time.  Plan: Telephone follow up appointment with care management  team member scheduled for:  3 months and The patient has been provided with contact information for the care management team and has been advised to call with any health related questions or concerns.   Tomasa Blase, PharmD Clinical Pharmacist, [place]

## 2021-04-06 ENCOUNTER — Telehealth: Payer: Self-pay | Admitting: Pulmonary Disease

## 2021-04-06 NOTE — Telephone Encounter (Signed)
I have called Adam Briggs and she is aware of appt scheduled with BI on 12/2 at 230.

## 2021-04-20 ENCOUNTER — Encounter: Payer: Self-pay | Admitting: Pulmonary Disease

## 2021-04-20 ENCOUNTER — Other Ambulatory Visit: Payer: Self-pay

## 2021-04-20 ENCOUNTER — Ambulatory Visit (INDEPENDENT_AMBULATORY_CARE_PROVIDER_SITE_OTHER): Payer: Medicare Other | Admitting: Pulmonary Disease

## 2021-04-20 VITALS — BP 110/70 | HR 106 | Temp 98.2°F | Ht 70.0 in | Wt 158.2 lb

## 2021-04-20 DIAGNOSIS — J90 Pleural effusion, not elsewhere classified: Secondary | ICD-10-CM | POA: Diagnosis not present

## 2021-04-20 DIAGNOSIS — Z7901 Long term (current) use of anticoagulants: Secondary | ICD-10-CM | POA: Diagnosis not present

## 2021-04-20 DIAGNOSIS — R942 Abnormal results of pulmonary function studies: Secondary | ICD-10-CM

## 2021-04-20 DIAGNOSIS — J9819 Other pulmonary collapse: Secondary | ICD-10-CM

## 2021-04-20 NOTE — Progress Notes (Signed)
Synopsis: Referred in May 2022 for abnormal CT chest, abnormal PET scan by [name], *  Subjective:   PATIENT ID: Adam Briggs GENDER: male DOB: [birth year], MRN: [id number]  Chief Complaint  Patient presents with   [facility] follow up    This is an 87 year old gentleman, former smoker quit in 1968, approximate 17-pack-year history.  Patient has a past medical history of reflux, diabetes, hypertension, hypercholesterolemia, prostate cancer.  Patient was recently admitted to the hospital with a right-sided loculated pleural effusion.  Patient was discharged from the hospital on 09/28/2020.  In the hospital the patient was found to have right-sided loculated effusion with pleural thickening.  Patient underwent pigtail catheter placement after a thoracentesis on 09/16/2020.  Right-sided loculated effusion with a left upper lobe nodule and mediastinal adenopathy there was concern for malignancy.  There is also a hypodense lesion within the liver.  Recommended outpatient PET scan.  Cytology was sent off from the pleural fluid.  Both cytologies were negative.  No evidence of malignancy.  Additionally patient's pleural fluid on 09/16/2020 revealed 339 nucleated cells and 92% lymphocytes.  Patient was discharged from the hospital with a ex vacuo pneumothorax on chest x-ray.  A PET scan was completed on 10/02/2020.  This revealed a 21 mm left upper lobe hypermetabolic lung mass with an SUV max of 11.5 concerning for primary bronchogenic carcinoma.  Additionally there was hypermetabolic subcarinal lymph nodes concerning for metastatic disease.  And small bilateral paratracheal adenopathy with low-level hypermetabolic uptake.  There was what was considered a right-sided loculated hydropneumothorax.  This looks stable in comparison to previous images during his hospitalization.  From a respiratory standpoint the patient is doing well has no significant complaints.  He is on Eliquis for  atrial fibrillation.  He has not had general anesthesia in some time.  Has a follow-up scheduled with cardiology soon.  OV 10/25/2020: Here today for follow-up after recent bronchoscopy.  All culture results tissue biopsy of the left upper lobe nodule and subcarinal node were negative.  He does have atypical cells not enough to call malignancy in the left upper lobe brushings.  He does have hypermetabolic PET uptake.  I still think this is a primary lung cancer. I am not really sure what to do with the right sided pleural effusion and loculated hydropneumothorax.  He appears to be asymptomatic.  I am not sure if this is related to malignancy or not we discussed this in detail today in the office.  We also explained why we made the decision not to place an indwelling pleural catheter due to the heavy amount of sludge in the right chest seen under ultrasound at the time.  I do not think the thin IPC catheter will drain the material.  We discussed this today as well.  OV 04/20/2021: Here today for follow-up after hospitalization in August.  Also been home with home health.  He completed his treatments for radiation.  He has a this complex loculated hydropneumothorax.  With a nonexpanding lung.  On the right side as well as an effusion on the left side.  He has had them drained both on a recent hospitalization.  Once again both thoracentesis taps and August revealed lymphocyte predominant effusion.  Cytology was negative for malignancy    Past Medical History:  Diagnosis Date   Atrial fibrillation (HCC)    BENIGN PROSTATIC HYPERTROPHY    Cataract    DIVERTICULOSIS, COLON    DM  not diabetic-has been off metformin for about 2 years as of 09/2020 pr daughter   GERD    Heart murmur    Hernia    HYPERCHOLESTEROLEMIA    HYPERTENSION    Lung cancer ([place])    Moderate aortic stenosis 10/30/2011   echo 2013   Prostate cancer ([place])    s/p seed implant   Recurrent right pleural effusion      Family  History  Problem Relation Age of Onset   Heart attack Paternal Uncle        in his 30s   Atrial fibrillation Daughter    Colon cancer Neg Hx      Past Surgical History:  Procedure Laterality Date   BRONCHIAL BIOPSY  10/17/2020   Procedure: BRONCHIAL BIOPSIES;  Surgeon: Garner Nash, DO;  Location: [facility];  Service: Pulmonary;;   BRONCHIAL BRUSHINGS  10/17/2020   Procedure: BRONCHIAL BRUSHINGS;  Surgeon: Garner Nash, DO;  Location: [facility];  Service: Pulmonary;;   BRONCHIAL NEEDLE ASPIRATION BIOPSY  10/17/2020   Procedure: BRONCHIAL NEEDLE ASPIRATION BIOPSIES;  Surgeon: Garner Nash, DO;  Location: [facility];  Service: Pulmonary;;   BRONCHIAL WASHINGS  10/17/2020   Procedure: BRONCHIAL WASHINGS;  Surgeon: Garner Nash, DO;  Location: [facility];  Service: Pulmonary;;   CATARACT EXTRACTION W/ INTRAOCULAR LENS IMPLANT  02/2013   R, then L 4 weeks later - [place]   FIDUCIAL MARKER PLACEMENT  10/17/2020   Procedure: FIDUCIAL MARKER PLACEMENT;  Surgeon: Garner Nash, DO;  Location: [facility];  Service: Pulmonary;;   INGUINAL HERNIA REPAIR     Right   INGUINAL HERNIA REPAIR Left 07/15/2014   Procedure: LEFT INGUINAL HERNIA REPAIR WITH MESH;  Surgeon: Armandina Gemma, MD;  Location: [facility];  Service: General;  Laterality: Left;   INSERTION OF MESH Left 07/15/2014   Procedure: INSERTION OF MESH;  Surgeon: Armandina Gemma, MD;  Location: [facility];  Service: General;  Laterality: Left;   RADIOACTIVE SEED IMPLANT     THORACENTESIS N/A 01/05/2021   Procedure: THORACENTESIS;  Surgeon: Candee Furbish, MD;  Location: [facility];  Service: Pulmonary;  Laterality: N/A;   VIDEO BRONCHOSCOPY WITH ENDOBRONCHIAL NAVIGATION Left 10/17/2020   Procedure: VIDEO BRONCHOSCOPY WITH ENDOBRONCHIAL NAVIGATION;  Surgeon: Garner Nash, DO;  Location: [place];  Service: Pulmonary;  Laterality: Left;   VIDEO BRONCHOSCOPY WITH ENDOBRONCHIAL ULTRASOUND Bilateral 10/17/2020   Procedure:  VIDEO BRONCHOSCOPY WITH ENDOBRONCHIAL ULTRASOUND;  Surgeon: Garner Nash, DO;  Location: [facility];  Service: Pulmonary;  Laterality: Bilateral;    Social History   Socioeconomic History   Marital status: Widowed    Spouse name: Not on file   Number of children: 2   Years of education: Not on file   Highest education level: Not on file  Occupational History   Occupation: Retired     Comment: Retired  Tobacco Use   Smoking status: Former    Packs/day: 0.50    Years: 35.00    Pack years: 17.50    Types: Cigarettes    Quit date: 10/30/1966    Years since quitting: 54.5   Smokeless tobacco: Never  Vaping Use   Vaping Use: Never used  Substance and Sexual Activity   Alcohol use: No   Drug use: No   Sexual activity: Not on file  Other Topics Concern   Not on file  Social History Narrative   Pt says his diet is excellent   Daily caffeine    Social  Determinants of Health   Financial Resource Strain: Low Risk    Difficulty of Paying Living Expenses: Not hard at all  Food Insecurity: No Food Insecurity   Worried About [place] in the Last Year: Never true   Ran Out of Food in the Last Year: Never true  Transportation Needs: No Transportation Needs   Lack of Transportation (Medical): No   Lack of Transportation (Non-Medical): No  Physical Activity: Inactive   Days of Exercise per Week: 0 days   Minutes of Exercise per Session: 0 min  Stress: No Stress Concern Present   Feeling of Stress : Not at all  Social Connections: Socially Isolated   Frequency of Communication with Friends and Family: More than three times a week   Frequency of Social Gatherings with Friends and Family: Once a week   Attends Religious Services: Never   Marine scientist or Organizations: No   Attends Archivist Meetings: Never   Marital Status: Widowed  Human resources officer Violence: Not At Risk   Fear of Current or Ex-Partner: No   Emotionally Abused: No   Physically  Abused: No   Sexually Abused: No     Allergies  Allergen Reactions   Lisinopril Rash     Outpatient Medications Prior to Visit  Medication Sig Dispense Refill   apixaban (ELIQUIS) 5 MG TABS tablet Take 1 tablet (5 mg total) by mouth 2 (two) times daily. 180 tablet 1   calcium-vitamin D (OSCAL WITH D) 500-200 MG-UNIT tablet Take 1 tablet by mouth daily with breakfast.     fluticasone (FLONASE) 50 MCG/ACT nasal spray Place 2 sprays into both nostrils daily. 16 g 6   furosemide (LASIX) 20 MG tablet Take 1 tablet (20 mg total) by mouth daily. 30 tablet 3   levalbuterol (XOPENEX HFA) 45 MCG/ACT inhaler Inhale 1 puff into the lungs every 4 (four) hours as needed for wheezing or shortness of breath. 1 each 2   metoprolol tartrate (LOPRESSOR) 25 MG tablet Take 1 tablet (25 mg total) by mouth 2 (two) times daily. 180 tablet 3   Multiple Vitamin (MULTIVITAMIN WITH MINERALS) TABS tablet Take 1 tablet by mouth in the morning. Centrum Silver for Men 50+     Polyethylene Glycol 3350 (MIRALAX PO) Take 17 g by mouth daily as needed (constipation.).     senna-docusate (SENOKOT-S) 8.6-50 MG tablet Take 1 tablet by mouth at bedtime as needed for mild constipation or moderate constipation. (Patient taking differently: Take 1 tablet by mouth at bedtime.) 30 tablet 1   simvastatin (ZOCOR) 20 MG tablet Take 1 tablet (20 mg total) by mouth daily at 6 PM. 90 tablet 3   spironolactone (ALDACTONE) 50 MG tablet Take 1 tablet (50 mg total) by mouth daily. 30 tablet 3   tamsulosin (FLOMAX) 0.4 MG CAPS capsule Take 0.4 mg by mouth in the morning.     No facility-administered medications prior to visit.    Review of Systems  Constitutional:  Negative for chills, fever, malaise/fatigue and weight loss.  HENT:  Negative for hearing loss, sore throat and tinnitus.   Eyes:  Negative for blurred vision and double vision.  Respiratory:  Positive for shortness of breath. Negative for cough, hemoptysis, sputum production,  wheezing and stridor.   Cardiovascular:  Negative for chest pain, palpitations, orthopnea, leg swelling and PND.  Gastrointestinal:  Negative for abdominal pain, constipation, diarrhea, heartburn, nausea and vomiting.  Genitourinary:  Negative for dysuria, hematuria and urgency.  Musculoskeletal:  Negative for joint pain and myalgias.  Skin:  Negative for itching and rash.  Neurological:  Negative for dizziness, tingling, weakness and headaches.  Endo/Heme/Allergies:  Negative for environmental allergies. Does not bruise/bleed easily.  Psychiatric/Behavioral:  Negative for depression. The patient is not nervous/anxious and does not have insomnia.   All other systems reviewed and are negative.   Objective:  Physical Exam Vitals reviewed.  Constitutional:      General: He is not in acute distress.    Appearance: He is well-developed.  HENT:     Head: Normocephalic and atraumatic.  Eyes:     General: No scleral icterus.    Conjunctiva/sclera: Conjunctivae normal.     Pupils: Pupils are equal, round, and reactive to light.  Neck:     Vascular: No JVD.     Trachea: No tracheal deviation.  Cardiovascular:     Rate and Rhythm: Normal rate and regular rhythm.     Heart sounds: Murmur heard.  Pulmonary:     Effort: Pulmonary effort is normal. No tachypnea, accessory muscle usage or respiratory distress.     Breath sounds: No stridor. No wheezing, rhonchi or rales.     Comments: Near absent breath sounds in the bilateral lower lobes. Abdominal:     General: Bowel sounds are normal. There is no distension.     Palpations: Abdomen is soft.     Tenderness: There is no abdominal tenderness.  Musculoskeletal:        General: No tenderness.     Cervical back: Neck supple.  Lymphadenopathy:     Cervical: No cervical adenopathy.  Skin:    General: Skin is warm and dry.     Capillary Refill: Capillary refill takes less than 2 seconds.     Findings: No rash.  Neurological:     Mental  Status: He is alert and oriented to person, place, and time.  Psychiatric:        Behavior: Behavior normal.     Vitals:   04/20/21 1432  BP: 110/70  Pulse: (!) 106  Temp: 98.2 F (36.8 C)  TempSrc: Oral  SpO2: 95%  Weight: 158 lb 3.2 oz (71.8 kg)  Height: 5\' 10"  (1.778 m)   95% on RA BMI Readings from Last 3 Encounters:  04/20/21 22.70 kg/m  02/02/21 22.04 kg/m  01/29/21 22.27 kg/m   Wt Readings from Last 3 Encounters:  04/20/21 158 lb 3.2 oz (71.8 kg)  02/02/21 153 lb 9.6 oz (69.7 kg)  01/29/21 155 lb 3.2 oz (70.4 kg)     CBC    Component Value Date/Time   WBC 6.2 02/02/2021 1116   RBC 4.63 02/02/2021 1116   HGB 11.7 (L) 02/02/2021 1116   HCT 37.2 (L) 02/02/2021 1116   PLT 161.0 02/02/2021 1116   MCV 80.2 02/02/2021 1116   MCH 25.5 (L) 01/15/2021 0459   MCHC 31.4 02/02/2021 1116   RDW 17.9 (H) 02/02/2021 1116   LYMPHSABS 0.3 (L) 01/08/2021 1256   MONOABS 0.6 01/08/2021 1256   EOSABS 0.0 01/08/2021 1256   BASOSABS 0.0 01/08/2021 1256     Chest Imaging:  Nuclear medicine pet imaging: 0/35/0093 Hypermetabolic left upper lobe lesion SUV max 11 concerning for primary bronchogenic carcinoma, associated mediastinal subcarinal adenopathy with elevated SUV concerning for metastasis. There is hypermetabolic uptake within the pleural region with the associated hot loculated hydropneumothorax. The patient's images have been independently reviewed by me.    Pulmonary Functions Testing Results: No flowsheet data found.  FeNO:  Pathology:   10/17/2020 A. LYMPH NODE, 7, FINE NEEDLE ASPIRATION:  - No malignant cells identified  - Lymphoid tissue present   B. LUNG, LUL, BRUSHING:  - Atypical cells present  - See comment   C. LUNG, LUL, FINE NEEDLE ASPIRATION:  - No malignant cells identified   FINAL MICROSCOPIC DIAGNOSIS:  - No malignant cells identified  Reviewed path results today in the office.  Echocardiogram:   Heart Catheterization:      Assessment & Plan:     ICD-10-CM   1. Abnormal PET of left lung  R94.2     2. Pleural effusion  J90 DG Chest 2 View    3. On continuous oral anticoagulation  Z79.01     4. Recurrent right pleural effusion  J90     5. 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  All the drainages that he has had in the past on hospitalization or in the office have had lymphocyte predominance.  He is always left with a residual loculated hydropneumothorax.  We talked about various options of neck steps in management of a recurrent lymphocyte predominant effusion as well as consideration for multiple negative cytologies.  I do not think that he is a candidate for surgical decortication at the age of 89.  We also talked about IPC placement.  At this time he would like to hold off on all of the above consideration for observation and see what happens.  He is breathing okay at the moment on oxygen.  Plan: Continue to observe. I think if he has increasing shortness of breath again in the future any benefits with thoracentesis we can continue to drain him in the office. He would like to avoid hospitalization if possible. Both of which I agree with. At some point we may can consider again IPC placement but at this time he would like to hold off on that.   Current Outpatient Medications:    apixaban (ELIQUIS) 5 MG TABS tablet, Take 1 tablet (5 mg total) by mouth 2 (two) times daily., Disp: 180 tablet, Rfl: 1   calcium-vitamin D (OSCAL WITH D) 500-200 MG-UNIT tablet, Take 1 tablet by mouth daily with breakfast., Disp: , Rfl:    fluticasone (FLONASE) 50 MCG/ACT nasal spray, Place 2 sprays into both nostrils daily., Disp: 16 g, Rfl: 6   furosemide (LASIX) 20 MG tablet, Take 1 tablet (20 mg total)  by mouth daily., Disp: 30 tablet, Rfl: 3   levalbuterol (XOPENEX HFA) 45 MCG/ACT inhaler, Inhale 1 puff into the lungs every 4 (four) hours as needed for wheezing or shortness of breath., Disp: 1 each, Rfl: 2   metoprolol tartrate (LOPRESSOR) 25 MG tablet, Take 1 tablet (25 mg total) by mouth 2 (two) times daily., Disp: 180 tablet, Rfl: 3   Multiple Vitamin (MULTIVITAMIN WITH MINERALS) TABS tablet, Take 1 tablet by mouth in the morning. Centrum Silver for Men 50+, Disp: , Rfl:    Polyethylene Glycol 3350 (MIRALAX PO), Take 17 g by mouth daily as needed (constipation.)., Disp: , Rfl:    senna-docusate (SENOKOT-S) 8.6-50 MG tablet, Take 1 tablet by mouth at bedtime as needed for mild constipation or moderate constipation. (Patient taking differently: Take 1 tablet by mouth at bedtime.), Disp: 30 tablet, Rfl: 1   simvastatin (ZOCOR) 20 MG  tablet, Take 1 tablet (20 mg total) by mouth daily at 6 PM., Disp: 90 tablet, Rfl: 3   spironolactone (ALDACTONE) 50 MG tablet, Take 1 tablet (50 mg total) by mouth daily., Disp: 30 tablet, Rfl: 3   tamsulosin (FLOMAX) 0.4 MG CAPS capsule, Take 0.4 mg by mouth in the morning., Disp: , Rfl:     Garner Nash, DO [facility] 04/20/2021 2:36 PM

## 2021-04-20 NOTE — Patient Instructions (Signed)
Thank you for visiting Dr. [name] at [facility]. Today we recommend the following:  CXR prior to office visit in 3 months   Return in about 3 months (around 07/19/2021) for with APP or Dr. [name].    Please do your part to reduce the spread of COVID-19.

## 2021-04-26 DIAGNOSIS — N320 Bladder-neck obstruction: Secondary | ICD-10-CM | POA: Diagnosis not present

## 2021-04-30 NOTE — Progress Notes (Signed)
Cardiology Office Note   Date:  05/02/2021   ID:  Adam Briggs, DOB 1929-08-11, MRN [id number]  PCP:  [name], MD  Cardiologist:   Minus Breeding, MD Referring:    Chief Complaint  Patient presents with   Atrial Fibrillation     History of Present Illness: Adam Briggs is a 87 y.o. male who presents for follow up of atrial fib.   He is being managed for non small cell lung cancer.  In August he was in the hospital with respiratory failure and had bilateral thoracentesis.   He has had neoplasm of the left upper lung and has had a completed course of radiation.   Echocardiography earlier this year suggested moderate aortic stenosis.  There is some mention of diastolic dysfunction and acute diastolic heart failure.  Thoracentesis is not demonstrated clear etiology for his effusion and there were no malignant cells noted.  There was consideration of a pleural catheter but he went to [facility] to have another opinion.  He has residual loculated right effusion.  Of note there is discussion of cirrhosis.  He is on continuous 2 liters.   He had stable effusions on CT since I saw him and I reviewed this for this appt.    He actually looks pretty good.  Looking at his last pulmonary note he still has pleural effusions but he is being managed conservatively as he is not overly symptomatic.  He is on chronic O2.  He says that he does feel breathless but he is not having any new shortness of breath.  He is not having any PND or orthopnea.  He has had no new palpitations and does not know that he is in A. fib.  He had no presyncope or syncope.  He tolerates his anticoagulation.   Past Medical History:  Diagnosis Date   Atrial fibrillation ([place])    BENIGN PROSTATIC HYPERTROPHY    Cataract    DIVERTICULOSIS, COLON    DM    not diabetic-has been off metformin for about 2 years as of 09/2020 pr daughter   GERD    Heart murmur    Hernia    HYPERCHOLESTEROLEMIA    HYPERTENSION     Lung cancer ([place])    Moderate aortic stenosis 10/30/2011   echo 2013   Prostate cancer [facility])    s/p seed implant   Recurrent right pleural effusion     Past Surgical History:  Procedure Laterality Date   BRONCHIAL BIOPSY  10/17/2020   Procedure: BRONCHIAL BIOPSIES;  Surgeon: Garner Nash, DO;  Location: [place] ENDOSCOPY;  Service: Pulmonary;;   BRONCHIAL BRUSHINGS  10/17/2020   Procedure: BRONCHIAL BRUSHINGS;  Surgeon: Garner Nash, DO;  Location: [place] ENDOSCOPY;  Service: Pulmonary;;   BRONCHIAL NEEDLE ASPIRATION BIOPSY  10/17/2020   Procedure: BRONCHIAL NEEDLE ASPIRATION BIOPSIES;  Surgeon: Garner Nash, DO;  Location: [place] ENDOSCOPY;  Service: Pulmonary;;   BRONCHIAL WASHINGS  10/17/2020   Procedure: BRONCHIAL WASHINGS;  Surgeon: Garner Nash, DO;  Location: [place] ENDOSCOPY;  Service: Pulmonary;;   CATARACT EXTRACTION W/ INTRAOCULAR LENS IMPLANT  02/2013   R, then L 4 weeks later - [place]   FIDUCIAL MARKER PLACEMENT  10/17/2020   Procedure: FIDUCIAL MARKER PLACEMENT;  Surgeon: Garner Nash, DO;  Location: [place] ENDOSCOPY;  Service: Pulmonary;;   INGUINAL HERNIA REPAIR     Right   INGUINAL HERNIA REPAIR Left 07/15/2014   Procedure: LEFT INGUINAL HERNIA REPAIR WITH MESH;  Surgeon: Armandina Gemma, MD;  [facility];  Service: General;  Laterality: Left;   INSERTION OF MESH Left 07/15/2014   Procedure: INSERTION OF MESH;  Surgeon: Armandina Gemma, MD;  [facility];  Service: General;  Laterality: Left;   RADIOACTIVE SEED IMPLANT     THORACENTESIS N/A 01/05/2021   Procedure: THORACENTESIS;  Surgeon: Candee Furbish, MD;  Location: [facility] ENDOSCOPY;  Service: Pulmonary;  Laterality: N/A;   VIDEO BRONCHOSCOPY WITH ENDOBRONCHIAL NAVIGATION Left 10/17/2020   Procedure: VIDEO BRONCHOSCOPY WITH ENDOBRONCHIAL NAVIGATION;  Surgeon: Garner Nash, DO;  Location: [place];  Service: Pulmonary;  Laterality: Left;   VIDEO BRONCHOSCOPY WITH ENDOBRONCHIAL ULTRASOUND Bilateral 10/17/2020    Procedure: VIDEO BRONCHOSCOPY WITH ENDOBRONCHIAL ULTRASOUND;  Surgeon: Garner Nash, DO;  Location: [place];  Service: Pulmonary;  Laterality: Bilateral;     Current Outpatient Medications  Medication Sig Dispense Refill   apixaban (ELIQUIS) 5 MG TABS tablet Take 1 tablet (5 mg total) by mouth 2 (two) times daily. 180 tablet 1   calcium-vitamin D (OSCAL WITH D) 500-200 MG-UNIT tablet Take 1 tablet by mouth daily with breakfast.     fluticasone (FLONASE) 50 MCG/ACT nasal spray Place 2 sprays into both nostrils daily. 16 g 6   furosemide (LASIX) 20 MG tablet Take 1 tablet (20 mg total) by mouth daily. 30 tablet 3   levalbuterol (XOPENEX HFA) 45 MCG/ACT inhaler Inhale 1 puff into the lungs every 4 (four) hours as needed for wheezing or shortness of breath. 1 each 2   metoprolol tartrate (LOPRESSOR) 25 MG tablet Take 1 tablet (25 mg total) by mouth 2 (two) times daily. 180 tablet 3   Multiple Vitamin (MULTIVITAMIN WITH MINERALS) TABS tablet Take 1 tablet by mouth in the morning. Centrum Silver for Men 50+     Polyethylene Glycol 3350 (MIRALAX PO) Take 17 g by mouth daily as needed (constipation.).     senna-docusate (SENOKOT-S) 8.6-50 MG tablet Take 1 tablet by mouth at bedtime as needed for mild constipation or moderate constipation. 30 tablet 1   simvastatin (ZOCOR) 20 MG tablet Take 1 tablet (20 mg total) by mouth daily at 6 PM. 90 tablet 3   spironolactone (ALDACTONE) 50 MG tablet Take 1 tablet (50 mg total) by mouth daily. 30 tablet 3   tamsulosin (FLOMAX) 0.4 MG CAPS capsule Take 0.4 mg by mouth in the morning.     No current facility-administered medications for this visit.    Allergies:   Lisinopril   ROS:  Please see the history of present illness.   Otherwise, review of systems are positive for none.   All other systems are reviewed and negative.    PHYSICAL EXAM: VS:  BP 100/60 (BP Location: Left Arm, Patient Position: Sitting, Cuff Size: Normal)   Pulse 98   Ht 5\' 10"   (1.778 m)   Wt 158 lb (71.7 kg)   BMI 22.67 kg/m  , BMI Body mass index is 22.67 kg/m. GENERAL: Slightly frail appearing NECK:  No jugular venous distention, waveform within normal limits, carotid upstroke brisk and symmetric, no bruits, no thyromegaly LUNGS: Decreased breath sounds right greater than left at the bases CHEST:  Unremarkable HEART:  PMI not displaced or sustained,S1 and S2 within normal limits, no S3, no clicks, no rubs, 2 out of 6 apical systolic murmur radiating up the aortic outflow tract and early peaking, no diastolic murmurs ABD:  Flat, positive bowel sounds normal in frequency in pitch, no bruits, no rebound, no guarding,  no midline pulsatile mass, no hepatomegaly, no splenomegaly EXT:  2 plus pulses throughout, no edema, no cyanosis no clubbing  EKG:  EKG  ordered today. Atrial fibrillation, rate 98, left axis deviation, premature ectopic complexes, nonspecific T wave changes.   Recent Labs: 09/15/2020: Pro B Natriuretic peptide (BNP) 238.0 09/16/2020: Magnesium 2.0 09/18/2020: TSH 3.642 01/14/2021: B Natriuretic Peptide 357.5 02/02/2021: ALT 10; BUN 31; Creatinine, Ser 1.32; Hemoglobin 11.7; Platelets 161.0; Potassium 4.7; Sodium 139    Lipid Panel    Component Value Date/Time   CHOL 117 09/19/2020 0031   TRIG 43 09/19/2020 0031   HDL 36 (L) 09/19/2020 0031   CHOLHDL 3.3 09/19/2020 0031   VLDL 9 09/19/2020 0031   LDLCALC 72 09/19/2020 0031   LDLCALC 94 11/26/2019 1549   LDLDIRECT 110.2 12/28/2007 1123      Wt Readings from Last 3 Encounters:  05/02/21 158 lb (71.7 kg)  04/20/21 158 lb 3.2 oz (71.8 kg)  02/02/21 153 lb 9.6 oz (69.7 kg)      Other studies Reviewed: Additional studies/ records that were reviewed today include: [facility] records and CT.  Pulmonary notes. Review of the above records demonstrates:  Please see elsewhere in the note.     ASSESSMENT AND PLAN:   Atrial fib:  [name] has a CHA2DS2 - VASc score of 4.  He  tolerates anticoagulation.  No change in therapy.  He has had reasonable rate control and may check this.  Bilateral pleural effusion:    I did review the pulmonary records for this visit.  I reviewed the [facility] records.  We are managing this conservatively.  No change in therapy.   Aortic stenosis:   This appeared to be moderate on echo in May of this year.  He will continue meds as listed.   Current medicines are reviewed at length with the patient today.  The patient does not have concerns regarding medicines.  The following changes have been made:  None  Labs/ tests ordered today include:   None  Orders Placed This Encounter  Procedures   EKG 12-Lead     Disposition:   FU with me in 12 months.    Signed, Minus Breeding, MD  05/02/2021 10:40 AM    [place]

## 2021-05-02 ENCOUNTER — Encounter: Payer: Self-pay | Admitting: Cardiology

## 2021-05-02 ENCOUNTER — Other Ambulatory Visit: Payer: Self-pay

## 2021-05-02 ENCOUNTER — Ambulatory Visit (INDEPENDENT_AMBULATORY_CARE_PROVIDER_SITE_OTHER): Payer: Medicare Other | Admitting: Cardiology

## 2021-05-02 VITALS — BP 100/60 | HR 98 | Ht 70.0 in | Wt 158.0 lb

## 2021-05-02 DIAGNOSIS — I350 Nonrheumatic aortic (valve) stenosis: Secondary | ICD-10-CM | POA: Diagnosis not present

## 2021-05-02 DIAGNOSIS — I4819 Other persistent atrial fibrillation: Secondary | ICD-10-CM | POA: Diagnosis not present

## 2021-05-02 DIAGNOSIS — I4891 Unspecified atrial fibrillation: Secondary | ICD-10-CM | POA: Diagnosis not present

## 2021-05-02 DIAGNOSIS — J90 Pleural effusion, not elsewhere classified: Secondary | ICD-10-CM

## 2021-05-02 NOTE — Patient Instructions (Signed)
Medication Instructions:  Your Physician recommend you continue on your current medication as directed.    *If you need a refill on your cardiac medications before your next appointment, please call your pharmacy*  Follow-Up: At [facility], you and your health needs are our priority.  As part of our continuing mission to provide you with exceptional heart care, we have created designated Provider Care Teams.  These Care Teams include your primary Cardiologist (physician) and Advanced Practice Providers (APPs -  Physician Assistants and Nurse Practitioners) who all work together to provide you with the care you need, when you need it.  We recommend signing up for the patient portal called "MyChart".  Sign up information is provided on this After Visit Summary.  MyChart is used to connect with patients for Virtual Visits (Telemedicine).  Patients are able to view lab/test results, encounter notes, upcoming appointments, etc.  Non-urgent messages can be sent to your provider as well.   To learn more about what you can do with MyChart, go to [email or web address].    Your next appointment:   12 month(s)  The format for your next appointment:   In Person  Provider:   Minus Breeding, MD

## 2021-05-04 ENCOUNTER — Telehealth: Payer: Self-pay | Admitting: Internal Medicine

## 2021-05-04 NOTE — Telephone Encounter (Signed)
Called pt. Her husband answered the phone and stated his wife stepped away and he would have her return my call when she returns. Office number was provided.   When patient's daughter calls back please find out the name of the pharmacy she would like the refill sent to.

## 2021-05-04 NOTE — Telephone Encounter (Signed)
Patient is switching Rx plans and needs new orders to be sent to his new pharmacy. Requesting medication listed below:   apixaban (ELIQUIS) 5 MG TABS tablet senna-docusate (SENOKOT-S) 8.6-50 MG tablet metoprolol tartrate (LOPRESSOR) 25 MG tablet spironolactone (ALDACTONE) 50 MG tablet furosemide (LASIX) 20 MG tablet

## 2021-05-09 DIAGNOSIS — Z20828 Contact with and (suspected) exposure to other viral communicable diseases: Secondary | ICD-10-CM | POA: Diagnosis not present

## 2021-05-10 ENCOUNTER — Other Ambulatory Visit: Payer: Self-pay

## 2021-05-10 MED ORDER — SPIRONOLACTONE 50 MG PO TABS: 50.0000 mg | ORAL_TABLET | Freq: Every day | ORAL | 3 refills | Status: DC

## 2021-05-10 MED ORDER — APIXABAN 5 MG PO TABS: 5.0000 mg | ORAL_TABLET | Freq: Two times a day (BID) | ORAL | 1 refills | Status: DC

## 2021-05-10 MED ORDER — FUROSEMIDE 20 MG PO TABS: 20.0000 mg | ORAL_TABLET | Freq: Every day | ORAL | 3 refills | Status: DC

## 2021-05-10 MED ORDER — METOPROLOL TARTRATE 25 MG PO TABS: 25.0000 mg | ORAL_TABLET | Freq: Two times a day (BID) | ORAL | 3 refills | Status: AC

## 2021-05-10 MED ORDER — SENNOSIDES-DOCUSATE SODIUM 8.6-50 MG PO TABS: 1.0000 | ORAL_TABLET | Freq: Every evening | ORAL | 1 refills | Status: AC | PRN

## 2021-05-10 NOTE — Telephone Encounter (Signed)
Wife requesting spironolactone (ALDACTONE) 50 MG tablet sent to [place], [place]    Wife requesting remanding medication [facility] to [id number] [place], [place]  *see below*  Wife requesting a 90 day supply of all medication refills

## 2021-05-10 NOTE — Telephone Encounter (Signed)
Refills sent in to pharmacies as requested.

## 2021-05-29 ENCOUNTER — Other Ambulatory Visit: Payer: Self-pay | Admitting: Radiation Oncology

## 2021-05-29 DIAGNOSIS — C3412 Malignant neoplasm of upper lobe, left bronchus or lung: Secondary | ICD-10-CM

## 2021-05-30 ENCOUNTER — Telehealth: Payer: Self-pay | Admitting: *Deleted

## 2021-05-30 NOTE — Telephone Encounter (Signed)
CALLED PATIENT'S DAUGHTER- SHERRY SAUNDERS TO INFORM OF CT FOR 06-21-21- ARRIVAL TIME- 12:30 PM - @ [facility], PATIENT TO HAVE WATER ONLY- 4 HRS. PRIOR TO TEST, PATIENT TO HAVE I-STAT LABS PRIOR TO TEST(OK'D BY VANDA IN RADIOLOGY), PATIENT TO RECEIVE RESULTS FROM ALISON PERKINS ON 06-25-21 @ 3 PM, VIA TELEPHONE, PATIENT'S DAUGHTER- SHERRY SAUNDERS VERIFIED UNDERSTANDING THESE APPTS.

## 2021-06-12 DIAGNOSIS — Z20822 Contact with and (suspected) exposure to covid-19: Secondary | ICD-10-CM | POA: Diagnosis not present

## 2021-06-19 ENCOUNTER — Ambulatory Visit (INDEPENDENT_AMBULATORY_CARE_PROVIDER_SITE_OTHER): Payer: Medicare Other

## 2021-06-19 DIAGNOSIS — I4891 Unspecified atrial fibrillation: Secondary | ICD-10-CM

## 2021-06-19 DIAGNOSIS — I10 Essential (primary) hypertension: Secondary | ICD-10-CM | POA: Diagnosis not present

## 2021-06-19 NOTE — Progress Notes (Signed)
Chronic Care Management Pharmacy Note  06/19/2021 Name:  Adam Briggs MRN:  [id number] DOB:  06-18-1929  Subjective: Adam Briggs is an 88 y.o. year old male who is a primary patient of Sharlet Salina Real Cons, MD.  The CCM team was consulted for assistance with disease management and care coordination needs.   Summary: -Spoke with patient and daughter Adam Briggs - reports that patient is doing well, notes that breathing has been improved, stopped calcium/ vitamin D in November due to constipation  -Denies any issues with abnormal bruising or bleeding with eliquis -No issues with dehydration on current diuretic regimen  -scheduled for lung CT 2/2 - follow up to discuss results scheduled 2/6  Recommendations/Changes made from today's visit: - patient to restart calcium/ vitamin D at 1/2 tablet (scored tablets) daily, reach out to office with any medication issues/ concerns   Engaged with patient by telephone for follow up visit in response to provider referral for pharmacy case management and/or care coordination services.   Consent to Services:  The patient was given the following information about Chronic Care Management services today, agreed to services, and gave verbal consent: 1. CCM service includes personalized support from designated clinical staff supervised by the primary care provider, including individualized plan of care and coordination with other care providers 2. 24/7 contact phone numbers for assistance for urgent and routine care needs. 3. Service will only be billed when office clinical staff spend 20 minutes or more in a month to coordinate care. 4. Only one practitioner may furnish and bill the service in a calendar month. 5.The patient may stop CCM services at any time (effective at the end of the month) by phone call to the office staff. 6. The patient will be responsible for cost sharing (co-pay) of up to 20% of the service fee (after annual deductible is met). Patient  agreed to services and consent obtained.  Patient Care Team: [name], MD as PCP - General (Internal Medicine) Minus Breeding, MD as PCP - Cardiology (Cardiology) Renato Shin, MD (Endocrinology) Irene Shipper, MD (Gastroenterology) Carolan Clines, MD (Inactive) (Urology) Minus Breeding, MD (Cardiology) Delice Bison Darnelle Maffucci, [facility] (Pharmacist) Tomasa Blase, [facility] as Pharmacist (Pharmacist)  Recent office visits:  None since last visit   Recent consult visits:  05/02/2021 - Dr. Percival Spanish  - cardiology - EKG - afib - rate 98, left axis deviation, premature ectopic complexes, nonspecific T wave changes - no changes to medications - follow up in 12 months  04/20/2021 - Dr. [name] - Pulmonology - no changes to medications - follow up in 3 months    Hospital visits:  None since last visit   Objective:  Lab Results  Component Value Date   CREATININE 1.32 02/02/2021   BUN 31 (H) 02/02/2021   GFR 47.77 (L) 02/02/2021   GFRNONAA >60 01/15/2021   GFRAA 68 (L) 07/11/2014   NA 139 02/02/2021   K 4.7 02/02/2021   CALCIUM 10.0 02/02/2021   CO2 37 (H) 02/02/2021   GLUCOSE 127 (H) 02/02/2021    Lab Results  Component Value Date/Time   HGBA1C 6.2 09/15/2020 11:32 AM   HGBA1C 6.0 (H) 11/26/2019 03:49 PM   GFR 47.77 (L) 02/02/2021 11:16 AM   GFR 64.41 11/27/2020 11:15 AM   MICROALBUR 20.0 (H) 10/19/2013 10:59 AM   MICROALBUR 13.8 (H) 10/14/2012 10:28 AM    Last diabetic Eye exam:  Lab Results  Component Value Date/Time   HMDIABEYEEXA done - dr Christel Mormon -  no DM retinopathy 02/17/2013 12:00 AM    Last diabetic Foot exam:  No results found for: HMDIABFOOTEX   Lab Results  Component Value Date   CHOL 117 09/19/2020   HDL 36 (L) 09/19/2020   LDLCALC 72 09/19/2020   LDLDIRECT 110.2 12/28/2007   TRIG 43 09/19/2020   CHOLHDL 3.3 09/19/2020    Hepatic Function Latest Ref Rng & Units 02/02/2021 01/08/2021 11/27/2020  Total Protein 6.0 - 8.3 g/dL 6.3 6.7 6.2  Albumin 3.5  - 5.2 g/dL 3.6 3.4(L) 3.6  AST 0 - 37 U/L _0 ALT 0 - 53 U/L _1 Alk Phosphatase 39 - 117 U/L 55 57 56  Total Bilirubin 0.2 - 1.2 mg/dL 0.7 0.8 0.6  Bilirubin, Direct 0.0 - 0.3 mg/dL - - -    Lab Results  Component Value Date/Time   TSH 3.642 09/18/2020 05:20 PM   TSH 2.85 11/19/2018 09:22 AM   TSH 2.85 10/14/2012 10:28 AM    CBC Latest Ref Rng & Units 02/02/2021 01/15/2021 01/13/2021  WBC 4.0 - 10.5 K/uL 6.2 5.5 5.1  Hemoglobin 13.0 - 17.0 g/dL 11.7(L) 11.3(L) 10.9(L)  Hematocrit 39.0 - 52.0 % 37.2(L) 38.0(L) 37.8(L)  Platelets 150.0 - 400.0 K/uL 161.0 188 174    No results found for: VD25OH  Clinical ASCVD: No  The ASCVD Risk score (Arnett DK, et al., 2019) failed to calculate for the following reasons:   The 2019 ASCVD risk score is only valid for ages 65 to 71    Depression screen PHQ 2/9 03/01/2021 03/01/2021 11/27/2020  Decreased Interest 0 0 0  Down, Depressed, Hopeless 0 0 0  PHQ - 2 Score 0 0 0     Social History   Tobacco Use  Smoking Status Former   Packs/day: 0.50   Years: 35.00   Pack years: 17.50   Types: Cigarettes   Quit date: 10/30/1966   Years since quitting: 54.6  Smokeless Tobacco Never   BP Readings from Last 3 Encounters:  05/02/21 100/60  04/20/21 110/70  02/02/21 126/76   Pulse Readings from Last 3 Encounters:  05/02/21 98  04/20/21 (!) 106  02/02/21 97   Wt Readings from Last 3 Encounters:  05/02/21 158 lb (71.7 kg)  04/20/21 158 lb 3.2 oz (71.8 kg)  02/02/21 153 lb 9.6 oz (69.7 kg)   BMI Readings from Last 3 Encounters:  05/02/21 22.67 kg/m  04/20/21 22.70 kg/m  02/02/21 22.04 kg/m    Assessment/Interventions: Review of patient past medical history, allergies, medications, health status, including review of consultants reports, laboratory and other test data, was performed as part of comprehensive evaluation and provision of chronic care management services.   SDOH:  (Social Determinants of Health) assessments  and interventions performed: Yes  SDOH Screenings   Alcohol Screen: Low Risk    Last Alcohol Screening Score (AUDIT): 0  Depression (PHQ2-9): Low Risk    PHQ-2 Score: 0  Financial Resource Strain: Low Risk    Difficulty of Paying Living Expenses: Not hard at all  Food Insecurity: No Food Insecurity   Worried About Charity fundraiser in the Last Year: Never true   Ran Out of Food in the Last Year: Never true  Housing: Low Risk    Last Housing Risk Score: 0  Physical Activity: Inactive   Days of Exercise per Week: 0 days   Minutes of Exercise per Session: 0 min  Social Connections: Socially Isolated   Frequency of Communication with Friends  and Family: More than three times a week   Frequency of Social Gatherings with Friends and Family: Once a week   Attends Religious Services: Never   Marine scientist or Organizations: No   Attends Archivist Meetings: Never   Marital Status: Widowed  Stress: No Stress Concern Present   Feeling of Stress : Not at all  Tobacco Use: Medium Risk   Smoking Tobacco Use: Former   Smokeless Tobacco Use: Never   Passive Exposure: Not on file  Transportation Needs: No Transportation Needs   Lack of Transportation (Medical): No   Lack of Transportation (Non-Medical): No    CCM Care Plan  Allergies  Allergen Reactions   Lisinopril Rash    Medications Reviewed Today     Reviewed by Minus Breeding, MD (Physician) on 05/02/21 at [place] List Status: <None>   Medication Order Taking? Sig Documenting Provider Last Dose Status Informant  apixaban (ELIQUIS) 5 MG TABS tablet [id number] Yes Take 1 tablet (5 mg total) by mouth 2 (two) times daily. [name], MD Taking Active   calcium-vitamin D Darron Doom WITH D) 500-200 MG-UNIT tablet [id number] Yes Take 1 tablet by mouth daily with breakfast. [provider] Taking Active   fluticasone (FLONASE) 50 MCG/ACT nasal spray [id number] Yes Place 2 sprays into both nostrils  daily. [name], MD Taking Active Family Member  furosemide (LASIX) 20 MG tablet [id number] Yes Take 1 tablet (20 mg total) by mouth daily. Mendel Corning, MD Taking Active   levalbuterol [facility] HFA) 45 MCG/ACT inhaler [id number] Yes Inhale 1 puff into the lungs every 4 (four) hours as needed for wheezing or shortness of breath. [name], MD Taking Active   metoprolol tartrate (LOPRESSOR) 25 MG tablet [id number] Yes Take 1 tablet (25 mg total) by mouth 2 (two) times daily. [name], MD Taking Active Self  Multiple Vitamin (MULTIVITAMIN WITH MINERALS) TABS tablet [id number] Yes Take 1 tablet by mouth in the morning. Centrum Silver for Men 50+ [provider] Taking Active Self  Polyethylene Glycol 3350 (MIRALAX PO) [id number] Yes Take 17 g by mouth daily as needed (constipation.). [provider] Taking Active Self  senna-docusate (SENOKOT-S) 8.6-50 MG tablet [id number] Yes Take 1 tablet by mouth at bedtime as needed for mild constipation or moderate constipation. Damita Lack, MD Taking Active   simvastatin (ZOCOR) 20 MG tablet [id number] Yes Take 1 tablet (20 mg total) by mouth daily at 6 PM. [name], MD Taking Active Self  spironolactone (ALDACTONE) 50 MG tablet [id number] Yes Take 1 tablet (50 mg total) by mouth daily. Rai, Vernelle Emerald, MD Taking Active   tamsulosin (FLOMAX) 0.4 MG CAPS capsule [id number] Yes Take 0.4 mg by mouth in the morning. [provider] Taking Active Self            Patient Active Problem List   Diagnosis Date Noted   Rash 02/02/2021   Pressure injury of skin 01/14/2021   Pleural effusion, left 01/09/2021   Liver lesion 01/09/2021   Other cirrhosis of liver ([place]) 01/09/2021   Loculated right lateral pneumothorax 01/08/2021   Leg swelling 11/27/2020   Malignant neoplasm of upper lobe of left lung ([place]) 10/31/2020   Recurrent right pleural effusion    Atrial fibrillation ([facility])    S/P  thoracentesis    SOB (shortness of breath) 09/15/2020   Pleural effusion, right 09/15/2020   Acute hypoxemic respiratory failure ([place]) 09/15/2020   Atrial fibrillation, new onset (  [place]) 09/15/2020   Wheezing 04/24/2019   Routine general medical examination at a health care facility 06/23/2014   Inguinal hernia 05/05/2014   Moderate aortic stenosis 10/30/2011   Abnormal ECG 09/24/2010   Type 2 diabetes mellitus with hyperlipidemia ([place]) 03/26/2010   Hyperlipidemia associated with type 2 diabetes mellitus ([place]) 12/28/2007   Essential hypertension 02/07/2007    Immunization History  Administered Date(s) Administered   Fluad Quad(high Dose 65+) 03/05/2019, 04/07/2020, 03/10/2021   Influenza Split 03/23/2012   Influenza Whole 02/17/2009, 03/26/2010   Influenza, High Dose Seasonal PF 04/16/2013, 03/22/2014, 01/12/2015, 02/10/2017, 03/20/2018   Influenza,inj,Quad PF,6+ Mos 01/11/2016   PFIZER Comirnaty(Gray Top)Covid-19 Tri-Sucrose Vaccine 02/18/2020   PFIZER(Purple Top)SARS-COV-2 Vaccination 06/05/2019, 06/26/2019   Pneumococcal Conjugate-13 01/12/2015   Pneumococcal Polysaccharide-23 05/23/2003   Td 10/14/2012    Conditions to be addressed/monitored:  Hypertension, Hyperlipidemia, Diabetes, Atrial Fibrillation and Chronic Kidney Disease  There are no care plans that you recently modified to display for this patient.        Medication Assistance:  Patient reports that he applied for patient assistance for eliquis in the past but was denied, able to afford medications at this time   Patient's preferred pharmacy is:  [facility] [address], [place] [address] [place] [address] Phone: [phone] Fax: [phone]  [facility] [place] [facility] [place], [place] [address] Phone: [phone] Fax: [phone]  [id number] [id number]: [phone]: [phone]   Uses pill box? Yes Pt endorses 100% compliance  Care Plan and Follow Up Patient Decision:  Patient requests no follow-up at this time.  Plan: Telephone follow up appointment with care management team member scheduled for:  3 months and The patient has been provided with contact information for the care management team and has been advised to call with any health related questions or concerns.   DTomasa Blase PharmD Clinical Pharmacist, [place]

## 2021-06-19 NOTE — Patient Instructions (Signed)
Visit Information  Following are the goals we discussed today:   Track and Monitor My Blood Pressure and Heart Rate   Timeframe:  Long-Range Goal Priority:  High Start Date:  10/25/2020                           Expected End Date:  04/26/2022                     Follow Up Date 12/17/2021   - check blood pressure weekly - write blood pressure results in a log or diary    Why is this important?   You won't feel high blood pressure, but it can still hurt your blood vessels.  High blood pressure can cause heart or kidney problems. It can also cause a stroke.  Making lifestyle changes like losing a little weight or eating less salt will help.  Checking your blood pressure at home and at different times of the day can help to control blood pressure.  If the doctor prescribes medicine remember to take it the way the doctor ordered.  Call the office if you cannot afford the medicine or if there are questions about it.     Notes: Patient will reach out to clinic should BP become uncontrolled (>130/80) / HR become uncontrolled (HR >100 or <60 bpm)  Plan: Telephone follow up appointment with care management team member scheduled for:  6 months  The patient has been provided with contact information for the care management team and has been advised to call with any health related questions or concerns.   Tomasa Blase, PharmD Clinical Pharmacist, [facility]   Please call the care guide team at [phone] if you need to cancel or reschedule your appointment.   Patient verbalizes understanding of instructions and care plan provided today and agrees to view in [place]. Active MyChart status confirmed with patient.

## 2021-06-20 DIAGNOSIS — Z20822 Contact with and (suspected) exposure to covid-19: Secondary | ICD-10-CM | POA: Diagnosis not present

## 2021-06-21 ENCOUNTER — Other Ambulatory Visit: Payer: Self-pay

## 2021-06-21 ENCOUNTER — Ambulatory Visit (HOSPITAL_COMMUNITY)
Admission: RE | Admit: 2021-06-21 | Discharge: 2021-06-21 | Disposition: A | Discharge status: Home / Self Care | Payer: Medicare Other | Source: Ambulatory Visit | Attending: Radiation Oncology | Admitting: Radiation Oncology

## 2021-06-21 DIAGNOSIS — C3412 Malignant neoplasm of upper lobe, left bronchus or lung: Secondary | ICD-10-CM | POA: Diagnosis not present

## 2021-06-21 DIAGNOSIS — R911 Solitary pulmonary nodule: Secondary | ICD-10-CM | POA: Diagnosis not present

## 2021-06-21 DIAGNOSIS — R918 Other nonspecific abnormal finding of lung field: Secondary | ICD-10-CM | POA: Diagnosis not present

## 2021-06-21 DIAGNOSIS — C3490 Malignant neoplasm of unspecified part of unspecified bronchus or lung: Secondary | ICD-10-CM | POA: Diagnosis not present

## 2021-06-21 DIAGNOSIS — I700 Atherosclerosis of aorta: Secondary | ICD-10-CM | POA: Diagnosis not present

## 2021-06-21 DIAGNOSIS — J90 Pleural effusion, not elsewhere classified: Secondary | ICD-10-CM | POA: Diagnosis not present

## 2021-06-21 LAB — POCT I-STAT CREATININE: Creatinine, Ser: 1.4 mg/dL — ABNORMAL HIGH (ref 0.61–1.24)

## 2021-06-21 MED ORDER — IOHEXOL 300 MG/ML  SOLN
100.0000 mL | Freq: Once | INTRAMUSCULAR | Status: AC | PRN
  Administered 2021-06-21: 75 mL via INTRAVENOUS

## 2021-06-21 MED ORDER — SODIUM CHLORIDE (PF) 0.9 % IJ SOLN
INTRAMUSCULAR | Status: AC
  Filled 2021-06-21: qty 50

## 2021-06-22 ENCOUNTER — Encounter: Payer: Self-pay | Admitting: Radiation Oncology

## 2021-06-22 NOTE — Progress Notes (Signed)
Spoke w/ patient's daughter "Adam Briggs", verified identity, and begin nursing interview. She states that patient [name]. Eisen is "Doing well." No symptoms reported at this time.  Meaningful use complete. Urology appointment- March 2023 No current urinary management medications.  Daughter notified of Adam Briggs 3:00pm-06/25/21 telephone appointment w/ Shona Simpson PA-C and verbalized understanding. I left my extension in case patient needs to call.  Patient contact [phone]

## 2021-06-25 ENCOUNTER — Ambulatory Visit
Admission: RE | Admit: 2021-06-25 | Discharge: 2021-06-25 | Disposition: A | Discharge status: Home / Self Care | Payer: Medicare Other | Source: Ambulatory Visit | Attending: Radiation Oncology | Admitting: Radiation Oncology

## 2021-06-25 DIAGNOSIS — Z87891 Personal history of nicotine dependence: Secondary | ICD-10-CM | POA: Diagnosis not present

## 2021-06-25 DIAGNOSIS — Z08 Encounter for follow-up examination after completed treatment for malignant neoplasm: Secondary | ICD-10-CM | POA: Diagnosis not present

## 2021-06-25 DIAGNOSIS — C3412 Malignant neoplasm of upper lobe, left bronchus or lung: Secondary | ICD-10-CM

## 2021-06-25 NOTE — Progress Notes (Signed)
Radiation Oncology         [phone] ________________________________ Outpatient Follow Up- Conducted via telephone due to current COVID-19 concerns for limiting patient exposure  I spoke with the patient to conduct this consult visit via telephone to spare the patient unnecessary potential exposure in the healthcare setting during the current COVID-19 pandemic. The patient was notified in advance and was offered a [place] meeting to allow for face to face communication but unfortunately reported that they did not have the appropriate resources/technology to support such a visit and instead preferred to proceed with a telephone visit.  Name: Adam Briggs        MRN: [id number]  Date of Service: 06/25/2021 DOB: 1929/10/08  [id number], Real Cons, MD  [name], *     REFERRING PHYSICIAN: Pricilla Holm A, *   DIAGNOSIS: The encounter diagnosis was Malignant neoplasm of upper lobe of left lung ([place]).   HISTORY OF PRESENT ILLNESS: Adam Briggs is a 88 y.o. male originally seen at the request of Dr. [name] for a putative lung cancer.  [name] presented to the hospital and was found to have a right-sided pleural effusion in May 2022, there was pleural thickening and thoracentesis on 09/16/2020 was placed.  Cytology was negative, it was recommended that he have further work-up as CT imaging showed concerns for a left upper lobe nodule with adenopathy in the mediastinum and a lesion in the liver.  A PET scan on 10/02/2020 showed a 2.1 cm mass in the left upper lobe with an SUV of 11.5 which was noted to touch the pleural surface, there was hypermetabolic subcarinal adenopathy as well as bilateral paratracheal adenopathy.  There was diffuse metabolic activity within the atelectatic right lung and hypermetabolism also seen in the left and right adrenal glands.  He underwent bronchoscopy on 10/18/2019, and the cytology was nonspecific other than an atypical cells in the left upper lobe  brushing. Plans were to proceed with SBRT which he completed in July 2022 and then to follow the nodule and regional nodes in surveillance as they were felt to be reactive from his effusions.   He underwent a CT chest on 06/21/21 that showed improvement in the left upper lobe nodule, now measuring 1.3 cm. Another ill defined nodule in the left lung apex improved to 7 mm. No new nodules or medistinal adenopathy was noted. He continues to have right and left pleural effusions, stable on the right, and decreased on the left. He has not had any additional thoracenteses since August 2022. He did however have three lesions noted in the liver, a 1.5 cm medial right liver, 1.2 cm subcapsular lesion in the lateral right liver, and a new 1.1 cm medial left liver lesion. He's contacted by phone to review these results.    PREVIOUS RADIATION THERAPY:   11/13/2020 through 11/23/2020 Site Technique Total Dose (Gy) Dose per Fx (Gy) Completed Fx Beam Energies  Lung, Left: Lung_Lt IMRT 60/60 12 5/5 6XFFF     PAST MEDICAL HISTORY:  Past Medical History:  Diagnosis Date   Atrial fibrillation ([place])    BENIGN PROSTATIC HYPERTROPHY    Cataract    DIVERTICULOSIS, COLON    DM    not diabetic-has been off metformin for about 2 years as of 09/2020 pr daughter   GERD    Heart murmur    Hernia    HYPERCHOLESTEROLEMIA    HYPERTENSION    Lung cancer ([place])    Moderate aortic stenosis 10/30/2011  echo 2013   Prostate cancer [facility])    s/p seed implant   Recurrent right pleural effusion        PAST SURGICAL HISTORY: Past Surgical History:  Procedure Laterality Date   BRONCHIAL BIOPSY  10/17/2020   Procedure: BRONCHIAL BIOPSIES;  Surgeon: Garner Nash, DO;  Location: [place] [facility]COPY;  Service: Pulmonary;;   BRONCHIAL BRUSHINGS  10/17/2020   Procedure: BRONCHIAL BRUSHINGS;  Surgeon: Garner Nash, DO;  Location: [place] [facility];  Service: Pulmonary;;   BRONCHIAL NEEDLE ASPIRATION BIOPSY  10/17/2020   Procedure:  BRONCHIAL NEEDLE ASPIRATION BIOPSIES;  Surgeon: Garner Nash, DO;  Location: [place] [facility]COPY;  Service: Pulmonary;;   BRONCHIAL WASHINGS  10/17/2020   Procedure: BRONCHIAL WASHINGS;  Surgeon: Garner Nash, DO;  Location: [place] [facility];  Service: Pulmonary;;   CATARACT EXTRACTION W/ INTRAOCULAR LENS IMPLANT  02/2013   R, then L 4 weeks later - [place]   FIDUCIAL MARKER PLACEMENT  10/17/2020   Procedure: FIDUCIAL MARKER PLACEMENT;  Surgeon: Garner Nash, DO;  Location: [place]OSCOPY;  Service: Pulmonary;;   INGUINAL HERNIA REPAIR     Right   INGUINAL HERNIA REPAIR Left 07/15/2014   Procedure: LEFT INGUINAL HERNIA REPAIR WITH MESH;  Surgeon: Armandina Gemma, MD;  [facility];  Service: General;  Laterality: Left;   INSERTION OF MESH Left 07/15/2014   Procedure: INSERTION OF MESH;  Surgeon: Armandina Gemma, MD;  Location[facility];  Service: General;  Laterality: Left;   RADIOACTIVE SEED IMPLANT     THORACENTESIS N/A 01/05/2021   Procedure: THORACENTESIS;  Surgeon: Candee Furbish, MD;  Location[facility] END[facility]COPY;  Service: Pulmonary;  Laterality: N/A;   VIDEO BRONCHOSCOPY WITH ENDOBRONCHIAL NAVIGATION Left 10/17/2020   Procedure: VIDEO BRONCHOSCOPY WITH ENDOBRONCHIAL NAVIGATION;  Surgeon: Garner Nash, DO;  Location: [place];  Service: Pulmonary;  Laterality: Left;   VIDEO BRONCHOSCOPY WITH ENDOBRONCHIAL ULTRASOUND Bilateral 10/17/2020   Procedure: VIDEO BRONCHOSCOPY WITH ENDOBRONCHIAL ULTRASOUND;  Surgeon: Garner Nash, DO;  Location: [place];  Service: Pulmonary;  Laterality: Bilateral;     FAMILY HISTORY:  Family History  Problem Relation Age of Onset   Heart attack Paternal Uncle        in his 3s   Atrial fibrillation Daughter    Colon cancer Neg Hx      SOCIAL HISTORY:  reports that he quit smoking about 54 years ago. His smoking use included cigarettes. He has a 17.50 pack-year smoking history. He has never used smokeless tobacco. He reports that he does not drink  alcohol and does not use drugs. The patient is widowed and lives in [place].    ALLERGIES: Lisinopril   MEDICATIONS:  Current Outpatient Medications  Medication Sig Dispense Refill   apixaban (ELIQUIS) 5 MG TABS tablet Take 1 tablet (5 mg total) by mouth 2 (two) times daily. 180 tablet 1   calcium-vitamin D (OSCAL WITH D) 500-200 MG-UNIT tablet Take 1 tablet by mouth daily with breakfast. (Patient not taking: Reported on 06/19/2021)     fluticasone (FLONASE) 50 MCG/ACT nasal spray Place 2 sprays into both nostrils daily. 16 g 6   furosemide (LASIX) 20 MG tablet Take 1 tablet (20 mg total) by mouth daily. 30 tablet 3   levalbuterol (XOPENEX HFA) 45 MCG/ACT inhaler Inhale 1 puff into the lungs every 4 (four) hours as needed for wheezing or shortness of breath. (Patient not taking: Reported on 06/19/2021) 1 each 2   metoprolol tartrate (LOPRESSOR) 25 MG tablet Take 1 tablet (25  mg total) by mouth 2 (two) times daily. 180 tablet 3   Multiple Vitamin (MULTIVITAMIN WITH MINERALS) TABS tablet Take 1 tablet by mouth in the morning. Centrum Silver for Men 50+     Polyethylene Glycol 3350 (MIRALAX PO) Take 17 g by mouth daily as needed (constipation.).     senna-docusate (SENOKOT-S) 8.6-50 MG tablet Take 1 tablet by mouth at bedtime as needed for mild constipation or moderate constipation. 30 tablet 1   simvastatin (ZOCOR) 20 MG tablet Take 1 tablet (20 mg total) by mouth daily at 6 PM. 90 tablet 3   spironolactone (ALDACTONE) 50 MG tablet Take 1 tablet (50 mg total) by mouth daily. 30 tablet 3   tamsulosin (FLOMAX) 0.4 MG CAPS capsule Take 0.4 mg by mouth in the morning.     No current facility-administered medications for this encounter.     REVIEW OF SYSTEMS: On review of systems, the patient reports that he is doing pretty well without new shortness of breath, chest pain, or increased effort. That being said he remains on oxygen at 2L and can get short of breath with exertion, unchanged overall  however. No other complaints are noted.      PHYSICAL EXAM:   Unable to assess due to encounter type.    ECOG = 1  0 - Asymptomatic (Fully active, able to carry on all predisease activities without restriction)  1 - Symptomatic but completely ambulatory (Restricted in physically strenuous activity but ambulatory and able to carry out work of a light or sedentary nature. For example, light housework, office work)  2 - Symptomatic, <50% in bed during the day (Ambulatory and capable of all self care but unable to carry out any work activities. Up and about more than 50% of waking hours)  3 - Symptomatic, >50% in bed, but not bedbound (Capable of only limited self-care, confined to bed or chair 50% or more of waking hours)  4 - Bedbound (Completely disabled. Cannot carry on any self-care. Totally confined to bed or chair)  5 - Death   Eustace Pen MM, Creech RH, Tormey DC, et al. [phone]). "Toxicity and response criteria of the [facility]". [place] Oncol. 5 (6): 649-55    LABORATORY DATA:  Lab Results  Component Value Date   WBC 6.2 02/02/2021   HGB 11.7 (L) 02/02/2021   HCT 37.2 (L) 02/02/2021   MCV 80.2 02/02/2021   PLT 161.0 02/02/2021   Lab Results  Component Value Date   NA 139 02/02/2021   K 4.7 02/02/2021   CL 97 02/02/2021   CO2 37 (H) 02/02/2021   Lab Results  Component Value Date   ALT 10 02/02/2021   AST 11 02/02/2021   ALKPHOS 55 02/02/2021   BILITOT 0.7 02/02/2021      RADIOGRAPHY: CT CHEST W CONTRAST  Result Date: 06/23/2021 CLINICAL DATA:  Non-small-cell lung cancer.  Restaging. EXAM: CT CHEST WITH CONTRAST TECHNIQUE: Multidetector CT imaging of the chest was performed during intravenous contrast administration. RADIATION DOSE REDUCTION: This exam was performed according to the departmental dose-optimization program which includes automated exposure control, adjustment of the mA and/or kV according to patient size and/or use of  iterative reconstruction technique. CONTRAST:  89mL OMNIPAQUE IOHEXOL 300 MG/ML  SOLN COMPARISON:  01/08/2021 FINDINGS: Cardiovascular: Heart is enlarged. Coronary artery calcification is evident. Moderate atherosclerotic calcification is noted in the wall of the thoracic aorta. Mediastinum/Nodes: No mediastinal lymphadenopathy. There is no hilar lymphadenopathy. The esophagus has normal imaging features.  There is no axillary lymphadenopathy. Lungs/Pleura: Loculated right pleural fluid collection is similar to prior. Small layering left pleural effusion has decreased in the interval. Architectural distortion and scarring in all lobes of the right lung is similar in the interval. 17 mm nodule medial left lung apex on the prior study is 13 mm today on image 23/5 Ill-defined nodule peripheral aspect of the lateral left lung apex has decreased substantially in the interval measuring 7 mm today on image 26/5. No new suspicious pulmonary nodule or mass in the left lung. Chronic atelectasis or scarring noted dependent left lower lobe. Upper Abdomen: 15 mm low-density lesion in the medial right liver (150/2) was not well seen previously, possibly due to lack of intravenous contrast on the prior exam. 12 mm subcapsular low-density lesion lateral right liver on 142/2 also not definitely seen previously. 11 mm low-density lesion in the medial segment left liver (149/2) appears new. Musculoskeletal: No worrisome lytic or sclerotic osseous abnormality. IMPRESSION: 1. Similar appearance of loculated right pleural fluid collection with associated architectural distortion and scarring in all lobes of the right lung. 2. Interval decrease in size of the left apical pulmonary nodules. 3. 3 ill-defined subtle hypoattenuating liver lesions appear new since prior although lack of intravenous contrast on previous CT imaging may account for this. As metastatic disease a concern, abdominal MRI with and without contrast recommended to  further evaluate. 4. Aortic Atherosclerosis (ICD10-I70.0). Electronically Signed   By: Misty Stanley M.D.   On: 06/23/2021 12:32       IMPRESSION/PLAN: 1. Putative Stage IA3, cT1cN0M0, NSCLC of the LUL. The patient's scans are reviewed and show improvement since therapy. We discussed continued follow up in 6 months per NCCN guidelines provided the patient continues to have good performance status. He and his daughter are in agreement with this. 2. Right pleural effusion and mediastinal adenopathy.  The patient's mediastinal adenopathy has resolved on his recent scan, and he will continue to follow up with pulmonary as needed for thoracenteses or pleurex placement if he became more symptomatic. 3. Liver findings. We reviewed the incidental findings on his CT scan. He desires to be aggressive with surveillance and while he would not likely be a candidate for systemic therapy, would like to know if these areas are malignant versus be reassured that they are not. We will proceed with an MRI of the abdomen and follow up with him by phone with these results.   Given current concerns for patient exposure during the COVID-19 pandemic, this encounter was conducted via telephone.  The patient has provided two factor identification and has given verbal consent for this type of encounter and has been advised to only accept a meeting of this type in a secure network environment. The time spent during this encounter was 35 minutes including preparation, discussion, and coordination of the patient's care. The attendants for this meeting include Hayden Pedro  and Bethann Goo and his daughter [name].  During the encounter, Hayden Pedro were located at [facility] Radiation Oncology Department.  Bethann Goo was located at home with his daughter [name].       Carola Rhine, [facility]   **Disclaimer: This note was dictated with voice recognition software.  Similar sounding words can inadvertently be transcribed and this note may contain transcription errors which may not have been corrected upon publication of note.**

## 2021-06-26 ENCOUNTER — Telehealth: Payer: Self-pay | Admitting: *Deleted

## 2021-06-26 NOTE — Telephone Encounter (Signed)
CALLED PATIENT'S DAUGHTER- SHERRY SAUNDERS TO INFORM OF MRI FOR HER DAD ON 06-30-21- ARRIVAL TIME- 3:30 PM @ [facility], PATIENT TO BE NPO- 4 HRS. PRIOR TO TEST, SPOKE WITH PATIENT'S DAUGHTER- SHERRY SAUNDERS AND SHE IS AWARE OF THIS TEST

## 2021-06-30 ENCOUNTER — Ambulatory Visit (HOSPITAL_COMMUNITY)
Admission: RE | Admit: 2021-06-30 | Discharge: 2021-06-30 | Disposition: A | Discharge status: Home / Self Care | Payer: Medicare Other | Source: Ambulatory Visit | Attending: Radiation Oncology | Admitting: Radiation Oncology

## 2021-06-30 DIAGNOSIS — C3412 Malignant neoplasm of upper lobe, left bronchus or lung: Secondary | ICD-10-CM | POA: Diagnosis not present

## 2021-06-30 DIAGNOSIS — K5730 Diverticulosis of large intestine without perforation or abscess without bleeding: Secondary | ICD-10-CM | POA: Diagnosis not present

## 2021-06-30 DIAGNOSIS — K7689 Other specified diseases of liver: Secondary | ICD-10-CM | POA: Diagnosis not present

## 2021-06-30 DIAGNOSIS — N281 Cyst of kidney, acquired: Secondary | ICD-10-CM | POA: Diagnosis not present

## 2021-06-30 DIAGNOSIS — Z85118 Personal history of other malignant neoplasm of bronchus and lung: Secondary | ICD-10-CM | POA: Diagnosis not present

## 2021-06-30 MED ORDER — GADOBUTROL 1 MMOL/ML IV SOLN
7.0000 mL | Freq: Once | INTRAVENOUS | Status: AC | PRN
  Administered 2021-06-30: 7 mL via INTRAVENOUS

## 2021-07-02 ENCOUNTER — Telehealth: Payer: Self-pay | Admitting: Radiation Oncology

## 2021-07-02 NOTE — Telephone Encounter (Signed)
I called the patient and his daughter [name] to let them know the MRI only showed cystic appearing change rather than metastatic disease. We will go back to our plans for surveillance as he desires to continue to be aggressive minded for surveillance.

## 2021-07-06 ENCOUNTER — Telehealth: Payer: Self-pay

## 2021-07-06 NOTE — Progress Notes (Signed)
° ° °  Chronic Care Management Pharmacy Assistant   Name: [name]  MRN: [id number] DOB: 13-Apr-1930  DUFFY Adam Briggs is an 88 y.o. year old male who presents for his follow-up CCM visit with the clinical pharmacist.  Reason for Encounter: Disease State-General     Recent office visits:  None ID  Recent consult visits:  None ID  Hospital visits:  None in previous 6 months  Medications: Outpatient Encounter Medications as of 07/06/2021  Medication Sig   apixaban (ELIQUIS) 5 MG TABS tablet Take 1 tablet (5 mg total) by mouth 2 (two) times daily.   calcium-vitamin D (OSCAL WITH D) 500-200 MG-UNIT tablet Take 1 tablet by mouth daily with breakfast. (Patient not taking: Reported on 06/19/2021)   fluticasone (FLONASE) 50 MCG/ACT nasal spray Place 2 sprays into both nostrils daily.   furosemide (LASIX) 20 MG tablet Take 1 tablet (20 mg total) by mouth daily.   levalbuterol (XOPENEX HFA) 45 MCG/ACT inhaler Inhale 1 puff into the lungs every 4 (four) hours as needed for wheezing or shortness of breath. (Patient not taking: Reported on 06/19/2021)   metoprolol tartrate (LOPRESSOR) 25 MG tablet Take 1 tablet (25 mg total) by mouth 2 (two) times daily.   Multiple Vitamin (MULTIVITAMIN WITH MINERALS) TABS tablet Take 1 tablet by mouth in the morning. Centrum Silver for Men 50+   Polyethylene Glycol 3350 (MIRALAX PO) Take 17 g by mouth daily as needed (constipation.).   senna-docusate (SENOKOT-S) 8.6-50 MG tablet Take 1 tablet by mouth at bedtime as needed for mild constipation or moderate constipation.   simvastatin (ZOCOR) 20 MG tablet Take 1 tablet (20 mg total) by mouth daily at 6 PM.   spironolactone (ALDACTONE) 50 MG tablet Take 1 tablet (50 mg total) by mouth daily.   tamsulosin (FLOMAX) 0.4 MG CAPS capsule Take 0.4 mg by mouth in the morning.   No facility-administered encounter medications on file as of 07/06/2021.   Have you had any problems recently with your health?Spoke with patient  daughter who states that patient is doing well . She stated that he has macular degeneration in left eye and will be seeing a retina specialist in 2 months. She also stated that he still has some constipation   Have you had any problems with your pharmacy?She states that patient does not have any problems getting medications from the pharmacy or the cost of medications   What issues or side effects are you having with your medications?She states that patient does not have any side effects to medications that she is aware of.  What would you like me to pass along to [facility] for them to help you with? She states that patient is doing well  What can we do to take care of you better? She states patient does not need anything at this time  Care Gaps: Colonoscopy-Na Diabetic Foot Exam-11/26/19 Ophthalmology-02/17/14 Dexa Scan - NA Annual Well Visit - NA Micro albumin-NA Hemoglobin A1c- NA  Star Rating Drugs: Simvastatin 20 mg-last fill 12/21/20 90 ds  [name] Clinical Pharmacist Assistant [phone]

## 2021-07-09 ENCOUNTER — Other Ambulatory Visit: Payer: Self-pay | Admitting: Internal Medicine

## 2021-07-11 DIAGNOSIS — H33102 Unspecified retinoschisis, left eye: Secondary | ICD-10-CM | POA: Diagnosis not present

## 2021-07-11 DIAGNOSIS — H52223 Regular astigmatism, bilateral: Secondary | ICD-10-CM | POA: Diagnosis not present

## 2021-07-11 DIAGNOSIS — H353134 Nonexudative age-related macular degeneration, bilateral, advanced atrophic with subfoveal involvement: Secondary | ICD-10-CM | POA: Diagnosis not present

## 2021-07-11 DIAGNOSIS — H5203 Hypermetropia, bilateral: Secondary | ICD-10-CM | POA: Diagnosis not present

## 2021-07-16 ENCOUNTER — Telehealth: Payer: Self-pay | Admitting: Internal Medicine

## 2021-07-16 NOTE — Telephone Encounter (Signed)
PT's daughter visits today with forms to be filled out! Forms are to be filled out and faxed to number on the bottom of the page. PT's daughter would also like to pick up forms when finished.  CB: [phone]

## 2021-07-23 ENCOUNTER — Ambulatory Visit: Payer: Medicare Other | Admitting: Pulmonary Disease

## 2021-07-23 ENCOUNTER — Ambulatory Visit: Payer: Medicare Other

## 2021-07-23 ENCOUNTER — Telehealth: Payer: Self-pay | Admitting: Pulmonary Disease

## 2021-07-23 DIAGNOSIS — J90 Pleural effusion, not elsewhere classified: Secondary | ICD-10-CM

## 2021-07-23 NOTE — Telephone Encounter (Signed)
States that her dad is SOb and believes there is fluid in his lungs. [facility] has previously instructed to get him in ASAP when that happens. Offered appointment on 3/8 but she did not want to wait that long. Per Maryann Conners, we are not to schedule ANY appointments for today. Please advise.  ?

## 2021-07-23 NOTE — Telephone Encounter (Signed)
Appt scheduled 07/25/2021 at 9:30 with Katie.  ?Patient's daughter, [name] is aware of appt date/time and need for CXR. ?CXR has been ordered. ?Nothing further needed. ? ?

## 2021-07-23 NOTE — Telephone Encounter (Signed)
There is no availability with NP's on 07/25/2021 ?

## 2021-07-23 NOTE — Telephone Encounter (Signed)
Katie can see at 0930 or can double book me (Anson Peddie NP )  at 0930 .  ? ?

## 2021-07-23 NOTE — Telephone Encounter (Signed)
Called and spoke with [name], patient's daughter.  [name] stated patient is seen by Dr. [name] and has recurrent fluid build up on lungs.  [name] stated Dr. [name] had advised in the past to let him know when signs of fluid build up are starting, so he can be evaluated, and get fluid removed as needed.   ?[name] stated patient started having some increased sob Thursday 07/19/21, sats have been remaining around 95% on 2L [place].  [name] stated patient is not showing any signs of distress, but may need evaluation and fluid removed this week.  [name] stated Tuesday or Wednesday she would be available to bring patient to office for Dr. [name] to eval. ? ?Message routed to Dr. [name] to advise ?

## 2021-07-25 ENCOUNTER — Ambulatory Visit (INDEPENDENT_AMBULATORY_CARE_PROVIDER_SITE_OTHER): Payer: Medicare Other

## 2021-07-25 ENCOUNTER — Ambulatory Visit (INDEPENDENT_AMBULATORY_CARE_PROVIDER_SITE_OTHER): Payer: Medicare Other | Admitting: Nurse Practitioner

## 2021-07-25 ENCOUNTER — Other Ambulatory Visit: Payer: Self-pay

## 2021-07-25 ENCOUNTER — Encounter: Payer: Self-pay | Admitting: Nurse Practitioner

## 2021-07-25 ENCOUNTER — Telehealth: Payer: Self-pay | Admitting: Nurse Practitioner

## 2021-07-25 DIAGNOSIS — J9611 Chronic respiratory failure with hypoxia: Secondary | ICD-10-CM | POA: Diagnosis not present

## 2021-07-25 DIAGNOSIS — I480 Paroxysmal atrial fibrillation: Secondary | ICD-10-CM | POA: Diagnosis not present

## 2021-07-25 DIAGNOSIS — J9811 Atelectasis: Secondary | ICD-10-CM | POA: Diagnosis not present

## 2021-07-25 DIAGNOSIS — I10 Essential (primary) hypertension: Secondary | ICD-10-CM | POA: Diagnosis not present

## 2021-07-25 DIAGNOSIS — J90 Pleural effusion, not elsewhere classified: Secondary | ICD-10-CM

## 2021-07-25 DIAGNOSIS — J9621 Acute and chronic respiratory failure with hypoxia: Secondary | ICD-10-CM | POA: Insufficient documentation

## 2021-07-25 DIAGNOSIS — J449 Chronic obstructive pulmonary disease, unspecified: Secondary | ICD-10-CM | POA: Diagnosis not present

## 2021-07-25 DIAGNOSIS — Z20822 Contact with and (suspected) exposure to covid-19: Secondary | ICD-10-CM | POA: Diagnosis not present

## 2021-07-25 NOTE — Progress Notes (Signed)
@Patient  ID: Adam Briggs, male    DOB: Apr 29, 1930, 88 y.o.   MRN: [id number]  No chief complaint on file.   Referring provider: [name], *  HPI: 88 year old male, former smoker (35-year history) followed for recurrent loculated right-sided pleural effusion.  He has history of a malignant neoplasm of upper lobe of left lung status post SBRT and is currently under surveillance with oncology.  He is a patient of Dr. Juline Patch and last seen in office on 04/20/2021.  Past medical history significant for prostate cancer, hypertension, AAS, A-fib on Eliquis, cirrhosis, DM 2.  Patient was hospitalized from April 20 22-5 05/08/2021 for right-sided loculated pleural effusion.  Underwent pigtail catheter placement after thoracentesis.  Also noted to have a left upper lobe nodule mediastinal adenopathy.  There was concern for malignancy.  Cytologies were negative.  Outpatient PET scan was ordered which showed hypermetabolic lung mass with SUV max of 11.5, concerning for primary bronchogenic carcinoma.  Additionally there was hypermetabolic subcarinal lymph nodes concerning for metastatic disease.  And small bilateral paratracheal adenopathy with low-level hypermetabolic uptake.  There was what is considered a right-sided loculated hydropneumothorax, which appeared stable when compared to images during his hospitalization.  Underwent bronchoscopy in June 2022.  All culture results, tissue biopsy of the left upper lobe nodule and subcarinal node were negative.  He does have some atypical cells but not enough to call malignancy in the left upper lobe brushings.  Still suspected to be a primary lung cancer.  Unsure of what to do with loculated right-sided pleural effusion.  Concerned that IPC catheter would not be able to drain the sludge in the right chest.  Decided to reassess moving forward  TEST/EVENTS:   04/20/2021: OV with Dr. [name] for hospital follow up.  Previously completed radiation therapy.   Continues to have loculated hydropneumothorax with nonexpanding lung on the right side as well as small effusion on the left side.  Both were drained during recent hospitalization.  Lymphocyte predominant effusion.  Cytology negative for malignancy.  Not a surgical candidate for decortication at the age of 33.  Discussed IPC placement however preferred to hold off at this time.  Breathing well at visit.  Advised to follow-up if he has increasing shortness of breath   07/25/2021: Today - acute  Patient presents today with daughter for worsening shortness of breath over the last 3-4 days. It is primarily upon exertion and he feels like he cannot complete activities without getting out of breath. They are worried that his pleural effusions are worse. He denies any cough, wheezing, orthopnea, PND or leg swelling. He has not been checking his oxygen at home but his sats were stable in office at 95% on 2 lpm which is his baseline. He has not been using his rescue inhaler for the shortness of breath. He is on chronic anticoagulation therapy with Eliquis for a fib and denies any recent excessive bruising or bleeding. Other than his breathing, he feels well.   Allergies  Allergen Reactions   Lisinopril Rash    Immunization History  Administered Date(s) Administered   Fluad Quad(high Dose 65+) 03/05/2019, 04/07/2020, 03/10/2021   Influenza Split 03/23/2012   Influenza Whole 02/17/2009, 03/26/2010   Influenza, High Dose Seasonal PF 04/16/2013, 03/22/2014, 01/12/2015, 02/10/2017, 03/20/2018   Influenza,inj,Quad PF,6+ Mos 01/11/2016   PFIZER Comirnaty(Gray Top)Covid-19 Tri-Sucrose Vaccine 02/18/2020   PFIZER(Purple Top)SARS-COV-2 Vaccination 06/05/2019, 06/26/2019   Pneumococcal Conjugate-13 01/12/2015   Pneumococcal Polysaccharide-23 05/23/2003   Td 10/14/2012  Past Medical History:  Diagnosis Date   Atrial fibrillation ([place])    BENIGN PROSTATIC HYPERTROPHY    Cataract    DIVERTICULOSIS, COLON     DM    not diabetic-has been off metformin for about 2 years as of 09/2020 pr daughter   GERD    Heart murmur    Hernia    HYPERCHOLESTEROLEMIA    HYPERTENSION    Lung cancer ([place])    Moderate aortic stenosis 10/30/2011   echo 2013   Prostate cancer ([place])    s/p seed implant   Recurrent right pleural effusion     Tobacco History: Social History   Tobacco Use  Smoking Status Former   Packs/day: 0.50   Years: 35.00   Pack years: 17.50   Types: Cigarettes   Quit date: 10/30/1966   Years since quitting: 54.7  Smokeless Tobacco Never   Counseling given: Not Answered   Outpatient Medications Prior to Visit  Medication Sig Dispense Refill   apixaban (ELIQUIS) 5 MG TABS tablet Take 1 tablet (5 mg total) by mouth 2 (two) times daily. 180 tablet 1   calcium-vitamin D (OSCAL WITH D) 500-200 MG-UNIT tablet Take 1 tablet by mouth daily with breakfast.     fluticasone (FLONASE) 50 MCG/ACT nasal spray Place 2 sprays into both nostrils daily. 16 g 6   furosemide (LASIX) 20 MG tablet TAKE 1 TABLET BY MOUTH EVERY DAY 30 tablet 3   levalbuterol (XOPENEX HFA) 45 MCG/ACT inhaler Inhale 1 puff into the lungs every 4 (four) hours as needed for wheezing or shortness of breath. 1 each 2   metoprolol tartrate (LOPRESSOR) 25 MG tablet Take 1 tablet (25 mg total) by mouth 2 (two) times daily. 180 tablet 3   Multiple Vitamin (MULTIVITAMIN WITH MINERALS) TABS tablet Take 1 tablet by mouth in the morning. Centrum Silver for Men 50+     Polyethylene Glycol 3350 (MIRALAX PO) Take 17 g by mouth daily as needed (constipation.).     senna-docusate (SENOKOT-S) 8.6-50 MG tablet Take 1 tablet by mouth at bedtime as needed for mild constipation or moderate constipation. 30 tablet 1   simvastatin (ZOCOR) 20 MG tablet Take 1 tablet (20 mg total) by mouth daily at 6 PM. 90 tablet 3   spironolactone (ALDACTONE) 50 MG tablet Take 1 tablet (50 mg total) by mouth daily. 30 tablet 3   tamsulosin (FLOMAX) 0.4 MG CAPS  capsule Take 0.4 mg by mouth in the morning.     No facility-administered medications prior to visit.     Review of Systems:   Constitutional: No weight loss or gain, night sweats, fevers, chills, fatigue, or lassitude. HEENT: No headaches, difficulty swallowing, tooth/dental problems, or sore throat. No sneezing, itching, ear ache, nasal congestion, or post nasal drip CV:  No chest pain, orthopnea, PND, swelling in lower extremities, anasarca, dizziness, palpitations, syncope Resp: +shortness of breath with exertion. No excess mucus or change in color of mucus. No productive or non-productive. No hemoptysis. No wheezing.  No chest wall deformity GI:  No heartburn, indigestion, abdominal pain, nausea, vomiting, diarrhea, change in bowel habits, loss of appetite, bloody stools.  GU: No dysuria, change in color of urine, urgency or frequency.  No flank pain, no hematuria  Skin: No rash, lesions, ulcerations MSK:  No joint pain or swelling.  No decreased range of motion.  No back pain. Neuro: No dizziness or lightheadedness.  Psych: No depression or anxiety. Mood stable.     Physical Exam:  BP  120/72    Pulse (!) 114    Ht 5\' 10"  (1.778 m)    Wt 153 lb (69.4 kg)    SpO2 95%    BMI 21.95 kg/m   GEN: Pleasant, interactive, well-nourished, elderly; in no acute distress. HEENT:  Normocephalic and atraumatic. PERRLA. Sclera white.  NECK:  Supple w/ fair ROM. No JVD present. Normal carotid impulses w/o bruits. Thyroid symmetrical with no goiter or nodules palpated. No lymphadenopathy.   CV: Mild tachycardia (102 apical), regular rhythm, murmur, no r/g, no peripheral edema. Pulses intact, +2 bilaterally. No cyanosis, pallor or clubbing. PULMONARY:  Unlabored, regular breathing. Absent breath sounds R lower lobe, diminished airflow left base and posterior field; w/o wheezes/rales/rhonchi. No accessory muscle use. No dullness to percussion. GI: BS present and normoactive. Soft, non-tender to  palpation.  MSK: No erythema, warmth or tenderness. Cap refil <2 sec all extrem. No deformities or joint swelling noted.  Neuro: A/Ox3. No focal deficits noted.   Skin: Warm, no lesions or rashe Psych: Normal affect and behavior. Judgement and thought content appropriate.     Lab Results:  CBC    Component Value Date/Time   WBC 6.2 02/02/2021 1116   RBC 4.63 02/02/2021 1116   HGB 11.7 (L) 02/02/2021 1116   HCT 37.2 (L) 02/02/2021 1116   PLT 161.0 02/02/2021 1116   MCV 80.2 02/02/2021 1116   MCH 25.5 (L) 01/15/2021 0459   MCHC 31.4 02/02/2021 1116   RDW 17.9 (H) 02/02/2021 1116   LYMPHSABS 0.3 (L) 01/08/2021 1256   MONOABS 0.6 01/08/2021 1256   EOSABS 0.0 01/08/2021 1256   BASOSABS 0.0 01/08/2021 1256    BMET    Component Value Date/Time   NA 139 02/02/2021 1116   K 4.7 02/02/2021 1116   CL 97 02/02/2021 1116   CO2 37 (H) 02/02/2021 1116   GLUCOSE 127 (H) 02/02/2021 1116   BUN 31 (H) 02/02/2021 1116   CREATININE 1.40 (H) 06/21/2021 1256   CREATININE 1.37 (H) 11/26/2019 1549   CALCIUM 10.0 02/02/2021 1116   GFRNONAA >60 01/15/2021 0459   GFRAA 68 (L) 07/11/2014 1025    BNP    Component Value Date/Time   BNP 357.5 (H) 01/14/2021 1407   BNP 126 (H) 11/26/2019 1549     Imaging:  DG Chest 2 View  Result Date: 07/25/2021 CLINICAL DATA:  Pleural effusion EXAM: CHEST - 2 VIEW COMPARISON:  01/14/2021 FINDINGS: Stable heart size and mediastinal contours. Volume loss in RIGHT hemithorax with mediastinal shift to the RIGHT. Persistent loculated RIGHT pleural effusion with scattered atelectasis and parenchymal scarring. Small LEFT pleural effusion. Underlying COPD. No definite acute infiltrate, pneumothorax, or osseous findings. IMPRESSION: Chronic loculated RIGHT pleural effusion with atelectasis and scarring in RIGHT lung. COPD changes with small LEFT pleural effusion and mild basilar atelectasis. Electronically Signed   By: Lavonia Dana M.D.   On: 07/25/2021 09:14   MR  Abdomen W Wo Contrast  Result Date: 07/02/2021 CLINICAL DATA:  History of lung cancer.  Liver lesions. EXAM: MRI ABDOMEN WITHOUT AND WITH CONTRAST TECHNIQUE: Multiplanar multisequence MR imaging of the abdomen was performed both before and after the administration of intravenous contrast. CONTRAST:  69mL GADAVIST GADOBUTROL 1 MMOL/ML IV SOLN COMPARISON:  CT chest 06/21/2021 FINDINGS: Study is limited due to motion. Lower chest: Bilateral pleural effusions right greater than left. Pleural-based consolidations in the right lower lung as seen on previous CT, most likely atelectasis. Cardiomegaly. Hepatobiliary: Liver is normal in size and contour. No suspicious hepatic  mass identified. A few nonenhancing hepatic cysts identified measuring up to 1.5 cm in the lateral right hepatic lobe and 1.4 cm in the medial right hepatic lobe. Gallbladder is contracted with no cholelithiasis or wall thickening visualized. No biliary ductal dilatation identified. Pancreas: No mass, inflammatory changes, or other parenchymal abnormality identified. Spleen:  Within normal limits in size and appearance. Adrenals/Urinary Tract: Adrenal glands appear normal. A few renal cortical cysts identified measuring up to 3.1 cm in the lower pole left kidney. No enhancing renal mass or hydronephrosis identified bilaterally. Stomach/Bowel: No evidence of bowel obstruction. Colonic diverticulosis. Vascular/Lymphatic: No pathologically enlarged lymph nodes identified. No abdominal aortic aneurysm demonstrated. Other:  No ascites. Musculoskeletal: No suspicious bony lesions identified. IMPRESSION: 1. No suspicious hepatic mass identified. Several small hepatic cysts. 2. Renal cortical cysts. 3. Colonic diverticulosis. 4. Bilateral pleural effusions with consolidative/atelectatic changes in the right lung base. Electronically Signed   By: Ofilia Neas M.D.   On: 07/02/2021 11:19      No flowsheet data found.  No results found for:  NITRICOXIDE      Assessment & Plan:   Recurrent right pleural effusion Stable in office without any respiratory distress. Discussed case with Dr. [name]. Given recurrence of effusion despite thoracentesis in past, advised that best treatment option would be IPC placement for symptom management. Discussed benefits/risks. Pt and daughter agreed with plan and verbalized understanding. Will schedule with procedure pool at [facility] for placement. Advised to seek emergency care if respiratory distress occurs or oxygen <88-90% despite O2 therapy.   Patient Instructions  Continue levalbuterol every 4 hours as needed for shortness of breath or wheezing. Notify if symptoms persist despite rescue inhaler/neb use. -Continue Eliquis 5 mg Twice daily. Monitor excessive  -Continue flonase 2 sprays each nostril daily -Continue lasix 20 mg daily  Someone will contact you to discuss chest drain placement and next steps  Follow up in the office after with Dr. [name]. If symptoms do not improve or worsen, please contact office for sooner follow up or seek emergency care.     Chronic respiratory failure with hypoxia (HCC) Stable with O2 95% on 2 lpm. Continue supplemental O2 for goal SpO2 >88-90%.  Pleural effusion, left Small left pleural effusion, appears improve when compared to previous imaging from August and stable when compared to February CT. Will continue to monitor.   Atrial fibrillation ([place]) Rate controlled. On Eliquis - will need to stop 48 hours before IPC drain placement. Followed by cardiology.   Essential hypertension BP well-controlled. Follow up with cardiology as scheduled.    I spent 40 minutes of dedicated to the care of this patient on the date of this encounter to include pre-visit review of records, face-to-face time with the patient discussing conditions above, post visit ordering of testing, clinical documentation with the electronic health record, making appropriate referrals  as documented, and communicating necessary findings to members of the patients care team.  Clayton Bibles, NP 07/25/2021  Pt aware and understands NP's role.

## 2021-07-25 NOTE — Assessment & Plan Note (Signed)
Small left pleural effusion, appears improve when compared to previous imaging from August and stable when compared to February CT. Will continue to monitor.  ?

## 2021-07-25 NOTE — Telephone Encounter (Signed)
Pt needs IPC drain placement. Discussed with Dr. [name] - available 3/20, 3/22, or 3/23. Schedule at [facility]. Please advised pt to hold Eliquis 48 hours before procedure. Thanks. ?

## 2021-07-25 NOTE — Assessment & Plan Note (Signed)
Stable with O2 95% on 2 lpm. Continue supplemental O2 for goal SpO2 >88-90%. ?

## 2021-07-25 NOTE — H&P (View-Only) (Signed)
? ?@Patient  ID: Adam Briggs, male    DOB: 1929/08/25, 88 y.o.   MRN: [id number] ? ?No chief complaint on file. ? ? ?Referring provider: ?[name], * ? ?HPI: ?88 year old male, former smoker (35-year history) followed for recurrent loculated right-sided pleural effusion.  He has history of a malignant neoplasm of upper lobe of left lung status post SBRT and is currently under surveillance with oncology.  He is a patient of Dr. Juline Patch and last seen in office on 04/20/2021.  Past medical history significant for prostate cancer, hypertension, AAS, A-fib on Eliquis, cirrhosis, DM 2. ? ?Patient was hospitalized from April 20 22-5 05/08/2021 for right-sided loculated pleural effusion.  Underwent pigtail catheter placement after thoracentesis.  Also noted to have a left upper lobe nodule mediastinal adenopathy.  There was concern for malignancy.  Cytologies were negative.  Outpatient PET scan was ordered which showed hypermetabolic lung mass with SUV max of 11.5, concerning for primary bronchogenic carcinoma.  Additionally there was hypermetabolic subcarinal lymph nodes concerning for metastatic disease.  And small bilateral paratracheal adenopathy with low-level hypermetabolic uptake.  There was what is considered a right-sided loculated hydropneumothorax, which appeared stable when compared to images during his hospitalization.  Underwent bronchoscopy in June 2022.  All culture results, tissue biopsy of the left upper lobe nodule and subcarinal node were negative.  He does have some atypical cells but not enough to call malignancy in the left upper lobe brushings.  Still suspected to be a primary lung cancer.  Unsure of what to do with loculated right-sided pleural effusion.  Concerned that IPC catheter would not be able to drain the sludge in the right chest.  Decided to reassess moving forward ? ?TEST/EVENTS:  ? ?04/20/2021: OV with Dr. [name] for hospital follow up.  Previously completed radiation therapy.   Continues to have loculated hydropneumothorax with nonexpanding lung on the right side as well as small effusion on the left side.  Both were drained during recent hospitalization.  Lymphocyte predominant effusion.  Cytology negative for malignancy.  Not a surgical candidate for decortication at the age of 70.  Discussed IPC placement however preferred to hold off at this time.  Breathing well at visit.  Advised to follow-up if he has increasing shortness of breath  ? ?07/25/2021: Today - acute  ?Patient presents today with daughter for worsening shortness of breath over the last 3-4 days. It is primarily upon exertion and he feels like he cannot complete activities without getting out of breath. They are worried that his pleural effusions are worse. He denies any cough, wheezing, orthopnea, PND or leg swelling. He has not been checking his oxygen at home but his sats were stable in office at 95% on 2 lpm which is his baseline. He has not been using his rescue inhaler for the shortness of breath. He is on chronic anticoagulation therapy with Eliquis for a fib and denies any recent excessive bruising or bleeding. Other than his breathing, he feels well.  ? ?Allergies  ?Allergen Reactions  ? Lisinopril Rash  ? ? ?Immunization History  ?Administered Date(s) Administered  ? Fluad Quad(high Dose 65+) 03/05/2019, 04/07/2020, 03/10/2021  ? Influenza Split 03/23/2012  ? Influenza Whole 02/17/2009, 03/26/2010  ? Influenza, High Dose Seasonal PF 04/16/2013, 03/22/2014, 01/12/2015, 02/10/2017, 03/20/2018  ? Influenza,inj,Quad PF,6+ Mos 01/11/2016  ? PFIZER Comirnaty(Gray Top)Covid-19 Tri-Sucrose Vaccine 02/18/2020  ? PFIZER(Purple Top)SARS-COV-2 Vaccination 06/05/2019, 06/26/2019  ? Pneumococcal Conjugate-13 01/12/2015  ? Pneumococcal Polysaccharide-23 05/23/2003  ? Td 10/14/2012  ? ? ?  Past Medical History:  ?Diagnosis Date  ? Atrial fibrillation ([place])   ? BENIGN PROSTATIC HYPERTROPHY   ? Cataract   ? DIVERTICULOSIS, COLON   ?  DM   ? not diabetic-has been off metformin for about 2 years as of 09/2020 pr daughter  ? GERD   ? Heart murmur   ? Hernia   ? HYPERCHOLESTEROLEMIA   ? HYPERTENSION   ? Lung cancer ([place])   ? Moderate aortic stenosis 10/30/2011  ? echo 2013  ? Prostate cancer [facility])   ? s/p seed implant  ? Recurrent right pleural effusion   ? ? ?Tobacco History: ?Social History  ? ?Tobacco Use  ?Smoking Status Former  ? Packs/day: 0.50  ? Years: 35.00  ? Pack years: 17.50  ? Types: Cigarettes  ? Quit date: 10/30/1966  ? Years since quitting: 54.7  ?Smokeless Tobacco Never  ? ?Counseling given: Not Answered ? ? ?Outpatient Medications Prior to Visit  ?Medication Sig Dispense Refill  ? apixaban (ELIQUIS) 5 MG TABS tablet Take 1 tablet (5 mg total) by mouth 2 (two) times daily. 180 tablet 1  ? calcium-vitamin D (OSCAL WITH D) 500-200 MG-UNIT tablet Take 1 tablet by mouth daily with breakfast.    ? fluticasone (FLONASE) 50 MCG/ACT nasal spray Place 2 sprays into both nostrils daily. 16 g 6  ? furosemide (LASIX) 20 MG tablet TAKE 1 TABLET BY MOUTH EVERY DAY 30 tablet 3  ? levalbuterol (XOPENEX HFA) 45 MCG/ACT inhaler Inhale 1 puff into the lungs every 4 (four) hours as needed for wheezing or shortness of breath. 1 each 2  ? metoprolol tartrate (LOPRESSOR) 25 MG tablet Take 1 tablet (25 mg total) by mouth 2 (two) times daily. 180 tablet 3  ? Multiple Vitamin (MULTIVITAMIN WITH MINERALS) TABS tablet Take 1 tablet by mouth in the morning. Centrum Silver for Men 50+    ? Polyethylene Glycol 3350 (MIRALAX PO) Take 17 g by mouth daily as needed (constipation.).    ? senna-docusate (SENOKOT-S) 8.6-50 MG tablet Take 1 tablet by mouth at bedtime as needed for mild constipation or moderate constipation. 30 tablet 1  ? simvastatin (ZOCOR) 20 MG tablet Take 1 tablet (20 mg total) by mouth daily at 6 PM. 90 tablet 3  ? spironolactone (ALDACTONE) 50 MG tablet Take 1 tablet (50 mg total) by mouth daily. 30 tablet 3  ? tamsulosin (FLOMAX) 0.4 MG CAPS  capsule Take 0.4 mg by mouth in the morning.    ? ?No facility-administered medications prior to visit.  ? ? ? ?Review of Systems:  ? ?Constitutional: No weight loss or gain, night sweats, fevers, chills, fatigue, or lassitude. ?HEENT: No headaches, difficulty swallowing, tooth/dental problems, or sore throat. No sneezing, itching, ear ache, nasal congestion, or post nasal drip ?CV:  No chest pain, orthopnea, PND, swelling in lower extremities, anasarca, dizziness, palpitations, syncope ?Resp: +shortness of breath with exertion. No excess mucus or change in color of mucus. No productive or non-productive. No hemoptysis. No wheezing.  No chest wall deformity ?GI:  No heartburn, indigestion, abdominal pain, nausea, vomiting, diarrhea, change in bowel habits, loss of appetite, bloody stools.  ?GU: No dysuria, change in color of urine, urgency or frequency.  No flank pain, no hematuria  ?Skin: No rash, lesions, ulcerations ?MSK:  No joint pain or swelling.  No decreased range of motion.  No back pain. ?Neuro: No dizziness or lightheadedness.  ?Psych: No depression or anxiety. Mood stable.  ? ? ? ?Physical Exam: ? ?BP  120/72   Pulse (!) 114   Ht 5\' 10"  (1.778 m)   Wt 153 lb (69.4 kg)   SpO2 95%   BMI 21.95 kg/m?  ? ?GEN: Pleasant, interactive, well-nourished, elderly; in no acute distress. ?HEENT:  Normocephalic and atraumatic. PERRLA. Sclera white.  ?NECK:  Supple w/ fair ROM. No JVD present. Normal carotid impulses w/o bruits. Thyroid symmetrical with no goiter or nodules palpated. No lymphadenopathy.   ?CV: Mild tachycardia (102 apical), regular rhythm, murmur, no r/g, no peripheral edema. Pulses intact, +2 bilaterally. No cyanosis, pallor or clubbing. ?PULMONARY:  Unlabored, regular breathing. Absent breath sounds R lower lobe, diminished airflow left base and posterior field; w/o wheezes/rales/rhonchi. No accessory muscle use. No dullness to percussion. ?GI: BS present and normoactive. Soft, non-tender to  palpation.  ?MSK: No erythema, warmth or tenderness. Cap refil <2 sec all extrem. No deformities or joint swelling noted.  ?Neuro: A/Ox3. No focal deficits noted.   ?Skin: Warm, no lesions or rashe ?Psych: Normal aff

## 2021-07-25 NOTE — Patient Instructions (Signed)
Continue levalbuterol every 4 hours as needed for shortness of breath or wheezing. Notify if symptoms persist despite rescue inhaler/neb use. ?-Continue Eliquis 5 mg Twice daily. Monitor excessive  ?-Continue flonase 2 sprays each nostril daily ?-Continue lasix 20 mg daily ? ?Someone will contact you to discuss chest drain placement and next steps ? ?Follow up in the office after with Dr. [name]. If symptoms do not improve or worsen, please contact office for sooner follow up or seek emergency care. ? ?

## 2021-07-25 NOTE — Assessment & Plan Note (Addendum)
Rate controlled. On Eliquis - will need to stop 48 hours before IPC drain placement. Followed by cardiology.  ?

## 2021-07-25 NOTE — Assessment & Plan Note (Addendum)
Stable in office without any respiratory distress. Discussed case with Dr. [name]. Given recurrence of effusion despite thoracentesis in past, advised that best treatment option would be IPC placement for symptom management. Discussed benefits/risks. Pt and daughter agreed with plan and verbalized understanding. Will schedule with procedure pool at [facility] for placement. Advised to seek emergency care if respiratory distress occurs or oxygen <88-90% despite O2 therapy.  ? ?Patient Instructions  ?Continue levalbuterol every 4 hours as needed for shortness of breath or wheezing. Notify if symptoms persist despite rescue inhaler/neb use. ?-Continue Eliquis 5 mg Twice daily. Monitor excessive  ?-Continue flonase 2 sprays each nostril daily ?-Continue lasix 20 mg daily ? ?Someone will contact you to discuss chest drain placement and next steps ? ?Follow up in the office after with Dr. [name]. If symptoms do not improve or worsen, please contact office for sooner follow up or seek emergency care. ? ? ? ?

## 2021-07-25 NOTE — Assessment & Plan Note (Signed)
BP well-controlled. Follow up with cardiology as scheduled.  ?

## 2021-07-26 NOTE — Telephone Encounter (Signed)
Called and spoke with patient's daughter [name] per [place]. Let them know their pleurx placement is scheduled for 08/08/2021 with Dr. [name] at [facility] at 2 pm. Patient was instructed to arrive at hospital at 1:30 pm. They were instructed to bring someone with them as they will not be able to drive home from procedure. Patient needs to hold their blood thinner Eliquis , 2 days prior to procedure.  ? ? ?Patient voiced understanding, nothing further needed ? ?Routing to Dr. [name] and Joellen Jersey as Juluis Rainier  ?

## 2021-07-31 ENCOUNTER — Encounter (HOSPITAL_COMMUNITY): Payer: Self-pay | Admitting: Pulmonary Disease

## 2021-08-01 ENCOUNTER — Ambulatory Visit: Payer: Medicare Other | Admitting: Pulmonary Disease

## 2021-08-06 ENCOUNTER — Ambulatory Visit: Payer: Medicare Other | Admitting: Internal Medicine

## 2021-08-08 ENCOUNTER — Encounter (HOSPITAL_COMMUNITY)
Admission: RE | Disposition: A | Discharge status: Home / Self Care | Payer: Self-pay | Source: Home / Self Care | Attending: Pulmonary Disease

## 2021-08-08 ENCOUNTER — Ambulatory Visit (HOSPITAL_COMMUNITY)
Admission: RE | Admit: 2021-08-08 | Discharge: 2021-08-08 | Disposition: A | Discharge status: Home / Self Care | Payer: Medicare Other | Attending: Pulmonary Disease | Admitting: Pulmonary Disease

## 2021-08-08 DIAGNOSIS — J90 Pleural effusion, not elsewhere classified: Secondary | ICD-10-CM

## 2021-08-08 DIAGNOSIS — J948 Other specified pleural conditions: Secondary | ICD-10-CM | POA: Diagnosis not present

## 2021-08-08 DIAGNOSIS — Z7901 Long term (current) use of anticoagulants: Secondary | ICD-10-CM | POA: Insufficient documentation

## 2021-08-08 DIAGNOSIS — Z87891 Personal history of nicotine dependence: Secondary | ICD-10-CM | POA: Insufficient documentation

## 2021-08-08 DIAGNOSIS — K7460 Unspecified cirrhosis of liver: Secondary | ICD-10-CM | POA: Insufficient documentation

## 2021-08-08 DIAGNOSIS — I4891 Unspecified atrial fibrillation: Secondary | ICD-10-CM | POA: Insufficient documentation

## 2021-08-08 DIAGNOSIS — I10 Essential (primary) hypertension: Secondary | ICD-10-CM | POA: Diagnosis not present

## 2021-08-08 DIAGNOSIS — C61 Malignant neoplasm of prostate: Secondary | ICD-10-CM | POA: Diagnosis not present

## 2021-08-08 DIAGNOSIS — R0600 Dyspnea, unspecified: Secondary | ICD-10-CM | POA: Insufficient documentation

## 2021-08-08 DIAGNOSIS — E119 Type 2 diabetes mellitus without complications: Secondary | ICD-10-CM | POA: Insufficient documentation

## 2021-08-08 DIAGNOSIS — C3412 Malignant neoplasm of upper lobe, left bronchus or lung: Secondary | ICD-10-CM | POA: Insufficient documentation

## 2021-08-08 HISTORY — PX: CHEST TUBE INSERTION: SHX231

## 2021-08-08 SURGERY — INSERTION, PLEURAL DRAINAGE CATHETER
Anesthesia: LOCAL

## 2021-08-08 NOTE — Discharge Planning (Signed)
Adam Briggs Adam Briggs, Adam Briggs, Adam Briggs, [place] [phone] ?RNCM consulted regarding discharge planning for [place]. RNCM referred pt to [facility] and they accepted Adam Briggs).   ? ?

## 2021-08-08 NOTE — Op Note (Signed)
Right PleurX Insertion Procedure Note ? ?DELDRICK LINCH  ?[id number]  ?02-29-1930 ? ?Date:08/08/21  ?Time:2:45 PM  ? ?Provider Performing:Jamariya Davidoff L Romanda Turrubiates ? ?Procedure: PleurX Tunneled Pleural Catheter Placement (14970) ? ?Indication(s) ?Relief of dyspnea from recurrent effusion ? ?Consent ?Risks of the procedure as well as the alternatives and risks of each were explained to the patient and/or caregiver.  Consent for the procedure was obtained. ? ?Anesthesia ?Topical only with 1% lidocaine  ? ?Time Out ?Verified patient identification, verified procedure, site/side was marked, verified correct patient position, special equipment/implants available, medications/allergies/relevant history reviewed, required imaging and test results available. ? ?Sterile Technique ?Maximal sterile technique including sterile barrier drape, hand hygiene, sterile gown, sterile gloves, mask, hair covering. ? ?Procedure Description ?Ultrasound used to identify appropriate pleural anatomy for placement and overlying skin marked.  Area of drainage cleaned and draped in sterile fashion.   Lidocaine was used to anesthetize the skin and subcutaneous tissue.   1.5 cm incision made overlying fluid and another about 5 cm anterior to this along chest wall.  PleurX catheter inserted in usual sterile fashion using modified seldinger technique.  Interrupted silk sutures placed at catheter insertion and tunneling points which will be removed at later date.  PleurX catheter then hooked to suction.  After fluid aspirated, pleurX capped and sterile dressing applied. ? ?Complications/Tolerance ?None; patient tolerated the procedure well. ?Chest X-ray is ordered to confirm no post-procedural complication. ? ?EBL ?Minimal ? ? ?Specimen(s) ?Pleural fluid for cytology  ? ?Garner Nash, DO ?[facility] ?08/08/2021 2:45 PM   ? ? ? ?

## 2021-08-08 NOTE — Interval H&P Note (Signed)
History and Physical Interval Note: ? ?08/08/2021 ?1:57 PM ? ?Adam Briggs  has presented today for surgery, with the diagnosis of pleural effusion.  The various methods of treatment have been discussed with the patient and family. After consideration of risks, benefits and other options for treatment, the patient has consented to  Procedure(s): ?[place] (N/A) as a surgical intervention.  The patient's history has been reviewed, patient examined, no change in status, stable for surgery.  I have reviewed the patient's chart and labs.  Questions were answered to the patient's satisfaction.   ? ? ?[name] ? ? ?

## 2021-08-09 ENCOUNTER — Telehealth: Payer: Self-pay | Admitting: Pulmonary Disease

## 2021-08-09 DIAGNOSIS — J90 Pleural effusion, not elsewhere classified: Secondary | ICD-10-CM

## 2021-08-09 LAB — CYTOLOGY - NON PAP

## 2021-08-09 NOTE — Telephone Encounter (Signed)
I called the patient daughter and she wants to know how often the catheter needs to be drained with home health. Please advise.  Per the daughter it has not been drained today.  ? ? ? ?[phone], [facility] home health contact.  ?

## 2021-08-09 NOTE — Telephone Encounter (Signed)
Pt had pleurx drain placed 3/22 by Dr. [name]. Routing this to Dr. [name] to see if it is okay for pt to resume taking the Eliquis now or if he needs to wait a couple more days prior to resuming. Please advise. ?

## 2021-08-09 NOTE — Telephone Encounter (Signed)
Called and spoke with pt's daughter stating to her that Dr. [name] said pt could resume taking the Eliquis and she verbalized understanding. Nothing further needed. ?

## 2021-08-09 NOTE — Telephone Encounter (Signed)
Order placed for home health to drain once daily. Nothing further needed.  ?

## 2021-08-10 DIAGNOSIS — J9811 Atelectasis: Secondary | ICD-10-CM | POA: Diagnosis not present

## 2021-08-10 DIAGNOSIS — I10 Essential (primary) hypertension: Secondary | ICD-10-CM | POA: Diagnosis not present

## 2021-08-10 DIAGNOSIS — K219 Gastro-esophageal reflux disease without esophagitis: Secondary | ICD-10-CM | POA: Diagnosis not present

## 2021-08-10 DIAGNOSIS — R918 Other nonspecific abnormal finding of lung field: Secondary | ICD-10-CM | POA: Diagnosis not present

## 2021-08-10 DIAGNOSIS — Z4801 Encounter for change or removal of surgical wound dressing: Secondary | ICD-10-CM | POA: Diagnosis not present

## 2021-08-10 DIAGNOSIS — K5730 Diverticulosis of large intestine without perforation or abscess without bleeding: Secondary | ICD-10-CM | POA: Diagnosis not present

## 2021-08-10 DIAGNOSIS — I4891 Unspecified atrial fibrillation: Secondary | ICD-10-CM | POA: Diagnosis not present

## 2021-08-10 DIAGNOSIS — Z4803 Encounter for change or removal of drains: Secondary | ICD-10-CM | POA: Diagnosis not present

## 2021-08-10 DIAGNOSIS — Z8639 Personal history of other endocrine, nutritional and metabolic disease: Secondary | ICD-10-CM | POA: Diagnosis not present

## 2021-08-10 DIAGNOSIS — K7689 Other specified diseases of liver: Secondary | ICD-10-CM | POA: Diagnosis not present

## 2021-08-10 DIAGNOSIS — N400 Enlarged prostate without lower urinary tract symptoms: Secondary | ICD-10-CM | POA: Diagnosis not present

## 2021-08-10 DIAGNOSIS — J9611 Chronic respiratory failure with hypoxia: Secondary | ICD-10-CM | POA: Diagnosis not present

## 2021-08-10 DIAGNOSIS — K7460 Unspecified cirrhosis of liver: Secondary | ICD-10-CM | POA: Diagnosis not present

## 2021-08-10 DIAGNOSIS — Z7901 Long term (current) use of anticoagulants: Secondary | ICD-10-CM | POA: Diagnosis not present

## 2021-08-10 DIAGNOSIS — J948 Other specified pleural conditions: Secondary | ICD-10-CM | POA: Diagnosis not present

## 2021-08-10 DIAGNOSIS — Z85118 Personal history of other malignant neoplasm of bronchus and lung: Secondary | ICD-10-CM | POA: Diagnosis not present

## 2021-08-10 DIAGNOSIS — J918 Pleural effusion in other conditions classified elsewhere: Secondary | ICD-10-CM | POA: Diagnosis not present

## 2021-08-10 DIAGNOSIS — Q6100 Congenital renal cyst, unspecified: Secondary | ICD-10-CM | POA: Diagnosis not present

## 2021-08-10 DIAGNOSIS — J449 Chronic obstructive pulmonary disease, unspecified: Secondary | ICD-10-CM | POA: Diagnosis not present

## 2021-08-10 DIAGNOSIS — E7800 Pure hypercholesterolemia, unspecified: Secondary | ICD-10-CM | POA: Diagnosis not present

## 2021-08-10 DIAGNOSIS — H269 Unspecified cataract: Secondary | ICD-10-CM | POA: Diagnosis not present

## 2021-08-10 DIAGNOSIS — K469 Unspecified abdominal hernia without obstruction or gangrene: Secondary | ICD-10-CM | POA: Diagnosis not present

## 2021-08-10 DIAGNOSIS — J90 Pleural effusion, not elsewhere classified: Secondary | ICD-10-CM | POA: Diagnosis not present

## 2021-08-10 DIAGNOSIS — Z48813 Encounter for surgical aftercare following surgery on the respiratory system: Secondary | ICD-10-CM | POA: Diagnosis not present

## 2021-08-10 DIAGNOSIS — I350 Nonrheumatic aortic (valve) stenosis: Secondary | ICD-10-CM | POA: Diagnosis not present

## 2021-08-10 DIAGNOSIS — Z8546 Personal history of malignant neoplasm of prostate: Secondary | ICD-10-CM | POA: Diagnosis not present

## 2021-08-12 ENCOUNTER — Encounter (HOSPITAL_COMMUNITY): Payer: Self-pay | Admitting: Pulmonary Disease

## 2021-08-14 NOTE — Progress Notes (Signed)
Sure things, thanks for always keeping [place] in the loop

## 2021-08-15 ENCOUNTER — Telehealth: Payer: Self-pay | Admitting: Pulmonary Disease

## 2021-08-15 NOTE — Telephone Encounter (Signed)
Lm for Candy with [facility]. ?

## 2021-08-15 NOTE — Telephone Encounter (Signed)
Spoke to candy with [facility].  ?Adam Briggs is wanting to verify when pleural catheter was placed. I have made Candy aware that it was placed 08/08/21. ?She is questioning what is causing the recurrent pleural effusion. She is also wanting to know if Dr. [name] feels that patient needs hospice care vs home health? ? ?Dr. [name], please advise. thanks ?

## 2021-08-15 NOTE — Telephone Encounter (Signed)
Adam Briggs is returning phone call. Candy phone number is [phone]. ?

## 2021-08-16 ENCOUNTER — Telehealth: Payer: Self-pay | Admitting: Pulmonary Disease

## 2021-08-16 ENCOUNTER — Encounter: Payer: Self-pay | Admitting: Pulmonary Disease

## 2021-08-16 ENCOUNTER — Ambulatory Visit (INDEPENDENT_AMBULATORY_CARE_PROVIDER_SITE_OTHER): Payer: Medicare Other | Admitting: Pulmonary Disease

## 2021-08-16 VITALS — BP 114/68 | HR 96 | Ht 70.0 in | Wt 157.0 lb

## 2021-08-16 DIAGNOSIS — J9611 Chronic respiratory failure with hypoxia: Secondary | ICD-10-CM | POA: Diagnosis not present

## 2021-08-16 DIAGNOSIS — J9819 Other pulmonary collapse: Secondary | ICD-10-CM

## 2021-08-16 DIAGNOSIS — Z7901 Long term (current) use of anticoagulants: Secondary | ICD-10-CM | POA: Diagnosis not present

## 2021-08-16 DIAGNOSIS — J9811 Atelectasis: Secondary | ICD-10-CM | POA: Diagnosis not present

## 2021-08-16 DIAGNOSIS — Z4803 Encounter for change or removal of drains: Secondary | ICD-10-CM | POA: Diagnosis not present

## 2021-08-16 DIAGNOSIS — J90 Pleural effusion, not elsewhere classified: Secondary | ICD-10-CM | POA: Diagnosis not present

## 2021-08-16 DIAGNOSIS — Z4801 Encounter for change or removal of surgical wound dressing: Secondary | ICD-10-CM | POA: Diagnosis not present

## 2021-08-16 DIAGNOSIS — Z48813 Encounter for surgical aftercare following surgery on the respiratory system: Secondary | ICD-10-CM | POA: Diagnosis not present

## 2021-08-16 NOTE — Patient Instructions (Signed)
Thank you for visiting Dr. [name] at [facility]. ?Today we recommend the following: ? ?Follow up 8 weeks for pleurex check  ? ?Return in about 8 weeks (around 10/11/2021) for with APP or Dr. [name]. ? ? ? ?Please do your part to reduce the spread of COVID-19.  ? ?

## 2021-08-16 NOTE — Telephone Encounter (Signed)
Called and spoke to pts daughter, [name]. She states the pt has been bleeding from his pleural cath insertion site. [name] states there has been small amounts of blood on the sponge, under the cath, about a dime size of bright red blood each day. However, on 3/29 the pt had saturated the sponge and the 4x4 covering the site. [name] states this morning when she checked the site the sponge and 4x4 were again saturated with brb. The home health nurse is coming today per [facility].  ? ?Pt denies any new worsening s/s but [name] states the pt's breathing has not improved. Per [name] there are no other visible issues with cath site - no redness, swelling, exudate, malodorous. Pt has OV with Beth on 3/31. Pt's sats have been mid to high 90s per pt, he checks his pulse/ox throughout the day. Pt is taking daily Eliquis, 5mg  BID. Sherry requesting urgent message be sent to Dr. [name].  ? ?Drainage: ?3/23 - 234ml ?3/24 - 252ml ?3/25 - 52ml, slow drainage ?3/26 - 231ml ?3/27 - 139ml ?3/28 - 275ml ?3/29 - 24ml ? ?Home health nurse will call if there is any new information to add to what [name] has already informed us of.  ? ? ? ?Dr. [name], please advise. Thanks.  ?

## 2021-08-16 NOTE — Progress Notes (Signed)
S: Here today in the office for postop wound care check for Pleurx catheter placement secondary to trapped lung and recurrent right-sided effusion.  Patient also had acute visit today for increased amount of blood coming from his incision site.  And suture removal. ? ?O: ?BP 114/68 (BP Location: Left Arm, Patient Position: Sitting, Cuff Size: Normal)   Pulse 96   Ht 5\' 10"  (1.778 m)   Wt 157 lb (71.2 kg)   SpO2 91% Comment: RA  BMI 22.53 kg/m?   ?General: Elderly gentleman resting in chair. ?Skin: Right chest with incision sites inspected, bruising at IPC insertion site.  Minimal amount of clotted blood around the most anterior site.  Sutures removed, no evidence of infection. ? ?Assessment: ?Trapped lung ?Recurrent right-sided effusion ?Indwelling pleural catheter ? ?Plan: ?Continue daily drainage ?Apply pressure to any additional bleeding. ?I suspect bleeding from being on Eliquis. ?Sutures removed and hopefully this will help with any other oozing blood at site. ?Follow-up in clinic in 8 weeks for Pleurx catheter check. ? ?I spent 10 minutes dedicated to the care of this patient on the date of this encounter to include pre-visit review of records, face-to-face time with the patient discussing conditions above, post visit ordering of testing, clinical documentation with the electronic health record, making appropriate referrals as documented, and communicating necessary findings to members of the patients care team.   ? ?Garner Nash, DO ?[facility] ?08/16/2021 3:12 PM   ?

## 2021-08-16 NOTE — Telephone Encounter (Signed)
Garner Nash, DO ?to Linwood Dibbles, CMA  [facility] Triage Pool   ?   5:52 PM ?Home health  ?Doesn't need hospice  ?The effusion is from a trapped lung  ?Thanks  ? ?BLI  ? ?Called Candy and there was no answer- LMTCB.  ?

## 2021-08-16 NOTE — Telephone Encounter (Signed)
Spoke with the pt's home health nurse, Jon Gills  ?She states that she came to see the pt this morning and he is continuing to have bleeding around catheter  ?OV with BI to get this looked at today at 2:45 ?

## 2021-08-17 ENCOUNTER — Ambulatory Visit: Payer: Medicare Other | Admitting: Primary Care

## 2021-08-18 DIAGNOSIS — Z20822 Contact with and (suspected) exposure to covid-19: Secondary | ICD-10-CM | POA: Diagnosis not present

## 2021-08-21 ENCOUNTER — Telehealth: Payer: Self-pay | Admitting: Internal Medicine

## 2021-08-21 DIAGNOSIS — Z4801 Encounter for change or removal of surgical wound dressing: Secondary | ICD-10-CM | POA: Diagnosis not present

## 2021-08-21 DIAGNOSIS — Z4803 Encounter for change or removal of drains: Secondary | ICD-10-CM | POA: Diagnosis not present

## 2021-08-21 DIAGNOSIS — J9611 Chronic respiratory failure with hypoxia: Secondary | ICD-10-CM | POA: Diagnosis not present

## 2021-08-21 DIAGNOSIS — I4891 Unspecified atrial fibrillation: Secondary | ICD-10-CM | POA: Diagnosis not present

## 2021-08-21 DIAGNOSIS — J90 Pleural effusion, not elsewhere classified: Secondary | ICD-10-CM | POA: Diagnosis not present

## 2021-08-21 DIAGNOSIS — K7469 Other cirrhosis of liver: Secondary | ICD-10-CM | POA: Diagnosis not present

## 2021-08-21 DIAGNOSIS — Z48813 Encounter for surgical aftercare following surgery on the respiratory system: Secondary | ICD-10-CM | POA: Diagnosis not present

## 2021-08-21 DIAGNOSIS — J9811 Atelectasis: Secondary | ICD-10-CM | POA: Diagnosis not present

## 2021-08-21 NOTE — Telephone Encounter (Signed)
[name] w/ [facility] making provider aware pts current bp reading as of 08-21-2021 is 92/52 ? ?Caller states pt is bleeding at his pleurx site and requesting verbal orders to perform a cbc and cmp ? ?Advised caller provider request she speaks w/ her cma  and provide more details regarding the bleeding. ? ?Also advised caller provider o'kd the CBC and CMP and request to have results faxed to [place].  ? ? ?

## 2021-08-21 NOTE — Telephone Encounter (Signed)
Spoke with [name] from [place] and was able to tell Dr. Sharlet Salina the reason for the blood work needed. Dr. Sharlet Salina gave the verbal ok to have blood work. Verbal orders given to [facility], she verbalized and stated that she would have a copy faxed to our office. No other questions or concerns at this time.  ?

## 2021-08-23 DIAGNOSIS — Z20822 Contact with and (suspected) exposure to covid-19: Secondary | ICD-10-CM | POA: Diagnosis not present

## 2021-08-25 DIAGNOSIS — Z20822 Contact with and (suspected) exposure to covid-19: Secondary | ICD-10-CM | POA: Diagnosis not present

## 2021-08-29 NOTE — Telephone Encounter (Signed)
Error

## 2021-08-30 ENCOUNTER — Other Ambulatory Visit: Payer: Self-pay | Admitting: Internal Medicine

## 2021-08-30 DIAGNOSIS — J90 Pleural effusion, not elsewhere classified: Secondary | ICD-10-CM | POA: Diagnosis not present

## 2021-08-30 DIAGNOSIS — Z48813 Encounter for surgical aftercare following surgery on the respiratory system: Secondary | ICD-10-CM | POA: Diagnosis not present

## 2021-08-30 DIAGNOSIS — J9611 Chronic respiratory failure with hypoxia: Secondary | ICD-10-CM | POA: Diagnosis not present

## 2021-08-30 DIAGNOSIS — Z4801 Encounter for change or removal of surgical wound dressing: Secondary | ICD-10-CM | POA: Diagnosis not present

## 2021-08-30 DIAGNOSIS — J9811 Atelectasis: Secondary | ICD-10-CM | POA: Diagnosis not present

## 2021-08-30 DIAGNOSIS — Z4803 Encounter for change or removal of drains: Secondary | ICD-10-CM | POA: Diagnosis not present

## 2021-08-31 ENCOUNTER — Telehealth: Payer: Self-pay | Admitting: Internal Medicine

## 2021-08-31 ENCOUNTER — Telehealth: Payer: Self-pay

## 2021-08-31 NOTE — Telephone Encounter (Signed)
Adam Briggs is aware of lab results. LVM with verbals for adding another nurse visit ?

## 2021-08-31 NOTE — Telephone Encounter (Signed)
Ailene Ravel w/ [facility] requesting a cb for pts lab results and to discuss adding another nurse visit for next wk. ? ?Phone [phone] ?

## 2021-08-31 NOTE — Telephone Encounter (Signed)
Pt called to verify that we received the labs form [facility]. I confirmed that the labs were received and Nurse confirmed that Dr. Sharlet Salina states all labs were normal. ? ?Pt daughter requested a copy since they aren't showing in [place]. I advised I would put a copy in the mail. ? ?FYI ?

## 2021-09-03 ENCOUNTER — Telehealth: Payer: Self-pay | Admitting: Pulmonary Disease

## 2021-09-03 NOTE — Telephone Encounter (Signed)
Called and spoke with Adam Briggs who stated that a fax was sent 4/11 for the home health certification plan that needs to be signed by Dr.Icard. she said she was going to fax it again today 4/17 in case we didn't receive the fax the first time. ? ? ?Routing to [place] as an FYI for her to be on the lookout for fax. ?

## 2021-09-04 ENCOUNTER — Telehealth: Payer: Self-pay

## 2021-09-04 NOTE — Progress Notes (Signed)
? ? ?Chronic Care Management ?Pharmacy Assistant  ? ?Name: Adam Briggs  MRN: [id number] DOB: February 18, 1930 ? ? ? ?Reason for Encounter: Disease State-General ?  ? ?Recent office visits:  ?None since last coordination call ? ?Recent consult visits:  ?08/16/21 Garner Nash, DO-Pulmonary Disease (Recurrent right pleural effusion) No orders or med changes ? ?07/25/21 Clayton Bibles, NP (Recurrent right pleural effusion) No orders or med changes ? ?Hospital visits:  ?Medication Reconciliation was completed by comparing discharge summary, patient?s EMR and Pharmacy list, and upon discussion with patient. ? ?Admitted to the hospital on 08/08/21 due to Recurrent right pleural effusion. Discharge date was 08/08/21. Discharged from [facility].   ? ?Medications that remain the same after Hospital Discharge:??  ?-All other medications will remain the same.   ? ?Medications: ?Outpatient Encounter Medications as of 09/04/2021  ?Medication Sig  ? apixaban (ELIQUIS) 5 MG TABS tablet Take 1 tablet (5 mg total) by mouth 2 (two) times daily.  ? Calcium Carbonate-Vitamin D 600-10 MG-MCG TABS Take 0.5 tablets by mouth daily with breakfast.  ? fluticasone (FLONASE) 50 MCG/ACT nasal spray Place 2 sprays into both nostrils daily. (Patient taking differently: Place 2 sprays into both nostrils daily as needed for allergies.)  ? furosemide (LASIX) 20 MG tablet TAKE 1 TABLET BY MOUTH EVERY DAY  ? levalbuterol (XOPENEX HFA) 45 MCG/ACT inhaler Inhale 1 puff into the lungs every 4 (four) hours as needed for wheezing or shortness of breath.  ? Lidocaine, Anorectal, 5 % CREA Place 1 application. rectally daily as needed (hemorrhoids).  ? metoprolol tartrate (LOPRESSOR) 25 MG tablet Take 1 tablet (25 mg total) by mouth 2 (two) times daily.  ? Multiple Vitamin (MULTIVITAMIN WITH MINERALS) TABS tablet Take 1 tablet by mouth in the morning. Centrum Silver for Men 50+  ? Polyethylene Glycol 3350 (MIRALAX PO) Take 17 g by mouth daily as  needed (constipation.).  ? senna-docusate (SENOKOT-S) 8.6-50 MG tablet Take 1 tablet by mouth at bedtime as needed for mild constipation or moderate constipation.  ? simvastatin (ZOCOR) 20 MG tablet Take 1 tablet (20 mg total) by mouth daily at 6 PM.  ? spironolactone (ALDACTONE) 50 MG tablet TAKE 1 TABLET DAILY  ? tamsulosin (FLOMAX) 0.4 MG CAPS capsule Take 0.4 mg by mouth in the morning.  ? ?No facility-administered encounter medications on file as of 09/04/2021.  ? ?Have you had any problems recently with your health?Spoke with daughter Adam Briggs who states that the patient has a Pleurx catheter placed in chest to remove fluid from lung, which they drain off every day. She states that he is having some issues with  hemorrhoids so they cut back calcium. ? ?Have you had any problems with your pharmacy?She states that patient has not problems with getting medications or the cost of medications from the pharmacy ? ?What issues or side effects are you having with your medications?She states that he is not having any side effects from medications that she knows of. ? ?What would you like me to pass along to [name] for them to help you with? The patient would like to know if he should cut back on his blood thinner since he is having some rectal bleeding ? ?What can we do to take care of you better? She states there is nothing patient needs at this time ? ?Care Gaps: ?Colonoscopy-NA ?Diabetic Foot Exam-11/26/19 ?Ophthalmology-02/17/14 ?Dexa Scan -  ?Annual Well Visit -  ?Micro albumin-10/19/13 ?Hemoglobin A1c- 09/15/20 ? ?Star Rating  Drugs: ?Simvastatin 20 mg-last fill ? ?[name] ?Clinical Pharmacist Assistant ?[phone]  ?

## 2021-09-05 NOTE — Telephone Encounter (Signed)
Called and spoke with wanda at [facility]. Advised her that Dr. [name] signed the home health certification plan and it had been faxed over to them. Mariann Laster confirmed that she received the fax. Nothing further needed.  ?

## 2021-09-06 DIAGNOSIS — J9811 Atelectasis: Secondary | ICD-10-CM | POA: Diagnosis not present

## 2021-09-06 DIAGNOSIS — J90 Pleural effusion, not elsewhere classified: Secondary | ICD-10-CM | POA: Diagnosis not present

## 2021-09-06 DIAGNOSIS — Z48813 Encounter for surgical aftercare following surgery on the respiratory system: Secondary | ICD-10-CM | POA: Diagnosis not present

## 2021-09-06 DIAGNOSIS — Z4801 Encounter for change or removal of surgical wound dressing: Secondary | ICD-10-CM | POA: Diagnosis not present

## 2021-09-06 DIAGNOSIS — Z4803 Encounter for change or removal of drains: Secondary | ICD-10-CM | POA: Diagnosis not present

## 2021-09-06 DIAGNOSIS — J9611 Chronic respiratory failure with hypoxia: Secondary | ICD-10-CM | POA: Diagnosis not present

## 2021-09-06 NOTE — Telephone Encounter (Signed)
Spoke with daughter [name],  ? ?Notes that recently was having some issues with hemorrhoids recently - yesterday was having some issues with bleeding - bright red blood when wiping, continued to use Anusol, notes that he has not had issues with straining to go continues to take senna-docusate. ? ?Advised for patient to continue to monitor, should bleeding increase / should color of blood change to a darker / any issues with tarry stools report to ER ? ?Patient to continue current medications at this time  ? ?Tomasa Blase, PharmD ?Clinical Pharmacist, [place]  ?

## 2021-09-09 DIAGNOSIS — Z48813 Encounter for surgical aftercare following surgery on the respiratory system: Secondary | ICD-10-CM | POA: Diagnosis not present

## 2021-09-09 DIAGNOSIS — N400 Enlarged prostate without lower urinary tract symptoms: Secondary | ICD-10-CM | POA: Diagnosis not present

## 2021-09-09 DIAGNOSIS — E7800 Pure hypercholesterolemia, unspecified: Secondary | ICD-10-CM | POA: Diagnosis not present

## 2021-09-09 DIAGNOSIS — J9611 Chronic respiratory failure with hypoxia: Secondary | ICD-10-CM | POA: Diagnosis not present

## 2021-09-09 DIAGNOSIS — Z85118 Personal history of other malignant neoplasm of bronchus and lung: Secondary | ICD-10-CM | POA: Diagnosis not present

## 2021-09-09 DIAGNOSIS — K469 Unspecified abdominal hernia without obstruction or gangrene: Secondary | ICD-10-CM | POA: Diagnosis not present

## 2021-09-09 DIAGNOSIS — R918 Other nonspecific abnormal finding of lung field: Secondary | ICD-10-CM | POA: Diagnosis not present

## 2021-09-09 DIAGNOSIS — Z7901 Long term (current) use of anticoagulants: Secondary | ICD-10-CM | POA: Diagnosis not present

## 2021-09-09 DIAGNOSIS — J449 Chronic obstructive pulmonary disease, unspecified: Secondary | ICD-10-CM | POA: Diagnosis not present

## 2021-09-09 DIAGNOSIS — I4891 Unspecified atrial fibrillation: Secondary | ICD-10-CM | POA: Diagnosis not present

## 2021-09-09 DIAGNOSIS — K5730 Diverticulosis of large intestine without perforation or abscess without bleeding: Secondary | ICD-10-CM | POA: Diagnosis not present

## 2021-09-09 DIAGNOSIS — K7460 Unspecified cirrhosis of liver: Secondary | ICD-10-CM | POA: Diagnosis not present

## 2021-09-09 DIAGNOSIS — Z4801 Encounter for change or removal of surgical wound dressing: Secondary | ICD-10-CM | POA: Diagnosis not present

## 2021-09-09 DIAGNOSIS — Z8546 Personal history of malignant neoplasm of prostate: Secondary | ICD-10-CM | POA: Diagnosis not present

## 2021-09-09 DIAGNOSIS — J90 Pleural effusion, not elsewhere classified: Secondary | ICD-10-CM | POA: Diagnosis not present

## 2021-09-09 DIAGNOSIS — K7689 Other specified diseases of liver: Secondary | ICD-10-CM | POA: Diagnosis not present

## 2021-09-09 DIAGNOSIS — J9811 Atelectasis: Secondary | ICD-10-CM | POA: Diagnosis not present

## 2021-09-09 DIAGNOSIS — I10 Essential (primary) hypertension: Secondary | ICD-10-CM | POA: Diagnosis not present

## 2021-09-09 DIAGNOSIS — K219 Gastro-esophageal reflux disease without esophagitis: Secondary | ICD-10-CM | POA: Diagnosis not present

## 2021-09-09 DIAGNOSIS — J948 Other specified pleural conditions: Secondary | ICD-10-CM | POA: Diagnosis not present

## 2021-09-09 DIAGNOSIS — Z4803 Encounter for change or removal of drains: Secondary | ICD-10-CM | POA: Diagnosis not present

## 2021-09-09 DIAGNOSIS — I350 Nonrheumatic aortic (valve) stenosis: Secondary | ICD-10-CM | POA: Diagnosis not present

## 2021-09-09 DIAGNOSIS — H269 Unspecified cataract: Secondary | ICD-10-CM | POA: Diagnosis not present

## 2021-09-09 DIAGNOSIS — Z8639 Personal history of other endocrine, nutritional and metabolic disease: Secondary | ICD-10-CM | POA: Diagnosis not present

## 2021-09-09 DIAGNOSIS — Q6100 Congenital renal cyst, unspecified: Secondary | ICD-10-CM | POA: Diagnosis not present

## 2021-09-10 ENCOUNTER — Encounter: Payer: Self-pay | Admitting: Internal Medicine

## 2021-09-13 ENCOUNTER — Telehealth: Payer: Self-pay | Admitting: Cardiology

## 2021-09-13 DIAGNOSIS — J90 Pleural effusion, not elsewhere classified: Secondary | ICD-10-CM | POA: Diagnosis not present

## 2021-09-13 DIAGNOSIS — J9611 Chronic respiratory failure with hypoxia: Secondary | ICD-10-CM | POA: Diagnosis not present

## 2021-09-13 DIAGNOSIS — J9811 Atelectasis: Secondary | ICD-10-CM | POA: Diagnosis not present

## 2021-09-13 DIAGNOSIS — Z4801 Encounter for change or removal of surgical wound dressing: Secondary | ICD-10-CM | POA: Diagnosis not present

## 2021-09-13 DIAGNOSIS — Z4803 Encounter for change or removal of drains: Secondary | ICD-10-CM | POA: Diagnosis not present

## 2021-09-13 DIAGNOSIS — Z48813 Encounter for surgical aftercare following surgery on the respiratory system: Secondary | ICD-10-CM | POA: Diagnosis not present

## 2021-09-13 NOTE — Telephone Encounter (Signed)
Spoke with Adam Briggs and pt has 2 + pitting edema and legs are tight [name] was wanting to know if pt can take take 40 mg of Lasix for 3 days then resume to 20 mg  Will forward to Dr Percival Spanish for review and recommendations ./cy ?

## 2021-09-13 NOTE — Telephone Encounter (Signed)
Paitent has had 3lbs weight gain in 24 hrs. Calling to see if his furosemide (LASIX) 20 MG tablet can be increase to get the weight off. Please  ?

## 2021-09-14 NOTE — Telephone Encounter (Signed)
Per [facility] note: ?Minus Breeding, MD 09/13/21 5:09 PM ?Yes that sounds reasonable.  (take 40 mg of Lasix for 3 days then resume to 20 mg) ?--------------- ? ?09-14-21 07:55:41 AM EDT  ?[name] ([facility])notified of above she will call and notify family. ?

## 2021-09-15 ENCOUNTER — Emergency Department (HOSPITAL_COMMUNITY): Payer: Medicare Other

## 2021-09-15 ENCOUNTER — Other Ambulatory Visit: Payer: Self-pay | Admitting: Internal Medicine

## 2021-09-15 ENCOUNTER — Inpatient Hospital Stay (HOSPITAL_COMMUNITY)
Admission: EM | Admit: 2021-09-15 | Discharge: 2021-09-18 | DRG: 374 | Disposition: A | Discharge status: Home Health | Payer: Medicare Other | Attending: Internal Medicine | Admitting: Internal Medicine

## 2021-09-15 ENCOUNTER — Other Ambulatory Visit: Payer: Self-pay

## 2021-09-15 ENCOUNTER — Encounter (HOSPITAL_COMMUNITY): Payer: Self-pay

## 2021-09-15 DIAGNOSIS — E871 Hypo-osmolality and hyponatremia: Secondary | ICD-10-CM | POA: Diagnosis present

## 2021-09-15 DIAGNOSIS — E7800 Pure hypercholesterolemia, unspecified: Secondary | ICD-10-CM | POA: Diagnosis present

## 2021-09-15 DIAGNOSIS — Z87891 Personal history of nicotine dependence: Secondary | ICD-10-CM

## 2021-09-15 DIAGNOSIS — Z8546 Personal history of malignant neoplasm of prostate: Secondary | ICD-10-CM | POA: Diagnosis not present

## 2021-09-15 DIAGNOSIS — T45515A Adverse effect of anticoagulants, initial encounter: Secondary | ICD-10-CM | POA: Diagnosis present

## 2021-09-15 DIAGNOSIS — C3412 Malignant neoplasm of upper lobe, left bronchus or lung: Secondary | ICD-10-CM | POA: Diagnosis not present

## 2021-09-15 DIAGNOSIS — Z9841 Cataract extraction status, right eye: Secondary | ICD-10-CM

## 2021-09-15 DIAGNOSIS — I4819 Other persistent atrial fibrillation: Secondary | ICD-10-CM | POA: Diagnosis not present

## 2021-09-15 DIAGNOSIS — K625 Hemorrhage of anus and rectum: Secondary | ICD-10-CM | POA: Diagnosis not present

## 2021-09-15 DIAGNOSIS — K7469 Other cirrhosis of liver: Secondary | ICD-10-CM | POA: Diagnosis present

## 2021-09-15 DIAGNOSIS — J90 Pleural effusion, not elsewhere classified: Secondary | ICD-10-CM | POA: Diagnosis present

## 2021-09-15 DIAGNOSIS — Z9842 Cataract extraction status, left eye: Secondary | ICD-10-CM | POA: Diagnosis not present

## 2021-09-15 DIAGNOSIS — Z8249 Family history of ischemic heart disease and other diseases of the circulatory system: Secondary | ICD-10-CM

## 2021-09-15 DIAGNOSIS — Z79899 Other long term (current) drug therapy: Secondary | ICD-10-CM

## 2021-09-15 DIAGNOSIS — I700 Atherosclerosis of aorta: Secondary | ICD-10-CM | POA: Diagnosis not present

## 2021-09-15 DIAGNOSIS — J9621 Acute and chronic respiratory failure with hypoxia: Secondary | ICD-10-CM | POA: Diagnosis not present

## 2021-09-15 DIAGNOSIS — Z7901 Long term (current) use of anticoagulants: Secondary | ICD-10-CM | POA: Diagnosis not present

## 2021-09-15 DIAGNOSIS — I350 Nonrheumatic aortic (valve) stenosis: Secondary | ICD-10-CM | POA: Diagnosis present

## 2021-09-15 DIAGNOSIS — R059 Cough, unspecified: Secondary | ICD-10-CM | POA: Diagnosis not present

## 2021-09-15 DIAGNOSIS — I480 Paroxysmal atrial fibrillation: Secondary | ICD-10-CM

## 2021-09-15 DIAGNOSIS — I10 Essential (primary) hypertension: Secondary | ICD-10-CM | POA: Diagnosis not present

## 2021-09-15 DIAGNOSIS — Z923 Personal history of irradiation: Secondary | ICD-10-CM

## 2021-09-15 DIAGNOSIS — K922 Gastrointestinal hemorrhage, unspecified: Secondary | ICD-10-CM | POA: Diagnosis not present

## 2021-09-15 DIAGNOSIS — K648 Other hemorrhoids: Secondary | ICD-10-CM | POA: Diagnosis present

## 2021-09-15 DIAGNOSIS — Z961 Presence of intraocular lens: Secondary | ICD-10-CM | POA: Diagnosis present

## 2021-09-15 DIAGNOSIS — D62 Acute posthemorrhagic anemia: Secondary | ICD-10-CM | POA: Diagnosis present

## 2021-09-15 DIAGNOSIS — D490 Neoplasm of unspecified behavior of digestive system: Secondary | ICD-10-CM | POA: Diagnosis not present

## 2021-09-15 DIAGNOSIS — R0602 Shortness of breath: Secondary | ICD-10-CM | POA: Diagnosis not present

## 2021-09-15 DIAGNOSIS — E8809 Other disorders of plasma-protein metabolism, not elsewhere classified: Secondary | ICD-10-CM | POA: Diagnosis not present

## 2021-09-15 DIAGNOSIS — C20 Malignant neoplasm of rectum: Secondary | ICD-10-CM | POA: Diagnosis not present

## 2021-09-15 DIAGNOSIS — N281 Cyst of kidney, acquired: Secondary | ICD-10-CM | POA: Diagnosis not present

## 2021-09-15 DIAGNOSIS — R609 Edema, unspecified: Secondary | ICD-10-CM | POA: Diagnosis not present

## 2021-09-15 DIAGNOSIS — R5383 Other fatigue: Secondary | ICD-10-CM | POA: Diagnosis not present

## 2021-09-15 DIAGNOSIS — K6289 Other specified diseases of anus and rectum: Secondary | ICD-10-CM | POA: Diagnosis not present

## 2021-09-15 DIAGNOSIS — I4891 Unspecified atrial fibrillation: Secondary | ICD-10-CM | POA: Diagnosis not present

## 2021-09-15 DIAGNOSIS — K921 Melena: Secondary | ICD-10-CM | POA: Diagnosis not present

## 2021-09-15 DIAGNOSIS — I493 Ventricular premature depolarization: Secondary | ICD-10-CM | POA: Diagnosis not present

## 2021-09-15 DIAGNOSIS — D6832 Hemorrhagic disorder due to extrinsic circulating anticoagulants: Secondary | ICD-10-CM | POA: Diagnosis not present

## 2021-09-15 DIAGNOSIS — Z85118 Personal history of other malignant neoplasm of bronchus and lung: Secondary | ICD-10-CM

## 2021-09-15 DIAGNOSIS — K219 Gastro-esophageal reflux disease without esophagitis: Secondary | ICD-10-CM | POA: Diagnosis present

## 2021-09-15 DIAGNOSIS — Z888 Allergy status to other drugs, medicaments and biological substances status: Secondary | ICD-10-CM

## 2021-09-15 DIAGNOSIS — D500 Iron deficiency anemia secondary to blood loss (chronic): Secondary | ICD-10-CM | POA: Diagnosis not present

## 2021-09-15 DIAGNOSIS — R4182 Altered mental status, unspecified: Secondary | ICD-10-CM | POA: Diagnosis not present

## 2021-09-15 DIAGNOSIS — I472 Ventricular tachycardia, unspecified: Secondary | ICD-10-CM | POA: Diagnosis not present

## 2021-09-15 LAB — MAGNESIUM: Magnesium: 1.7 mg/dL (ref 1.7–2.4)

## 2021-09-15 LAB — COMPREHENSIVE METABOLIC PANEL
ALT: 15 U/L (ref 0–44)
AST: 16 U/L (ref 15–41)
Albumin: 2.8 g/dL — ABNORMAL LOW (ref 3.5–5.0)
Alkaline Phosphatase: 41 U/L (ref 38–126)
Anion gap: 8 (ref 5–15)
BUN: 34 mg/dL — ABNORMAL HIGH (ref 8–23)
CO2: 32 mmol/L (ref 22–32)
Calcium: 8.9 mg/dL (ref 8.9–10.3)
Chloride: 86 mmol/L — ABNORMAL LOW (ref 98–111)
Creatinine, Ser: 1.1 mg/dL (ref 0.61–1.24)
GFR, Estimated: >60 mL/min (ref >60)
Glucose, Bld: 101 mg/dL — ABNORMAL HIGH (ref 70–99)
Potassium: 4.8 mmol/L (ref 3.5–5.1)
Sodium: 126 mmol/L — ABNORMAL LOW (ref 135–145)
Total Bilirubin: 0.5 mg/dL (ref 0.3–1.2)
Total Protein: 5.2 g/dL — ABNORMAL LOW (ref 6.5–8.1)

## 2021-09-15 LAB — URINALYSIS, DIPSTICK ONLY
Bacteria, UA: NONE SEEN
Bilirubin Urine: NEGATIVE
Glucose, UA: NEGATIVE mg/dL
Hgb urine dipstick: NEGATIVE
Ketones, ur: NEGATIVE mg/dL
Leukocytes,Ua: NEGATIVE
Nitrite: NEGATIVE
Protein, ur: 30 mg/dL — AB
Specific Gravity, Urine: 1.015 (ref 1.005–1.030)
pH: 6 (ref 5.0–8.0)

## 2021-09-15 LAB — CBC WITH DIFFERENTIAL/PLATELET
Abs Immature Granulocytes: 0.03 10*3/uL (ref 0.00–0.07)
Basophils Absolute: 0 10*3/uL (ref 0.0–0.1)
Basophils Relative: 0 %
Eosinophils Absolute: 0 10*3/uL (ref 0.0–0.5)
Eosinophils Relative: 0 %
HCT: 28.9 % — ABNORMAL LOW (ref 39.0–52.0)
Hemoglobin: 8.6 g/dL — ABNORMAL LOW (ref 13.0–17.0)
Immature Granulocytes: 0 %
Lymphocytes Relative: 4 %
Lymphs Abs: 0.3 10*3/uL — ABNORMAL LOW (ref 0.7–4.0)
MCH: 25.4 pg — ABNORMAL LOW (ref 26.0–34.0)
MCHC: 29.8 g/dL — ABNORMAL LOW (ref 30.0–36.0)
MCV: 85.5 fL (ref 80.0–100.0)
Monocytes Absolute: 0.8 10*3/uL (ref 0.1–1.0)
Monocytes Relative: 12 %
Neutro Abs: 5.6 10*3/uL (ref 1.7–7.7)
Neutrophils Relative %: 84 %
Platelets: 188 10*3/uL (ref 150–400)
RBC: 3.38 MIL/uL — ABNORMAL LOW (ref 4.22–5.81)
RDW: 14.9 % (ref 11.5–15.5)
WBC: 6.8 10*3/uL (ref 4.0–10.5)
nRBC: 0 % (ref 0.0–0.2)

## 2021-09-15 LAB — TYPE AND SCREEN
ABO/RH(D): AB POS
Antibody Screen: NEGATIVE

## 2021-09-15 LAB — TROPONIN I (HIGH SENSITIVITY)
Troponin I (High Sensitivity): 11 ng/L (ref <18)
Troponin I (High Sensitivity): 14 ng/L (ref <18)

## 2021-09-15 LAB — ABO/RH: ABO/RH(D): AB POS

## 2021-09-15 LAB — PROTIME-INR
INR: 1.4 — ABNORMAL HIGH (ref 0.8–1.2)
Prothrombin Time: 17 seconds — ABNORMAL HIGH (ref 11.4–15.2)

## 2021-09-15 LAB — POC OCCULT BLOOD, ED: Fecal Occult Bld: POSITIVE — AB

## 2021-09-15 LAB — BRAIN NATRIURETIC PEPTIDE: B Natriuretic Peptide: 345.1 pg/mL — ABNORMAL HIGH (ref 0.0–100.0)

## 2021-09-15 MED ORDER — FLUTICASONE PROPIONATE 50 MCG/ACT NA SUSP: 2.0000 | Freq: Every day | NASAL | Status: DC | PRN

## 2021-09-15 MED ORDER — FUROSEMIDE 10 MG/ML IJ SOLN
40.0000 mg | INTRAMUSCULAR | Status: AC
  Administered 2021-09-15: 40 mg via INTRAVENOUS
  Filled 2021-09-15: qty 4

## 2021-09-15 MED ORDER — ACETAMINOPHEN 650 MG RE SUPP: 650.0000 mg | Freq: Four times a day (QID) | RECTAL | Status: DC | PRN

## 2021-09-15 MED ORDER — PANTOPRAZOLE SODIUM 40 MG IV SOLR: 40.0000 mg | Freq: Two times a day (BID) | INTRAVENOUS | Status: DC

## 2021-09-15 MED ORDER — SIMVASTATIN 20 MG PO TABS
20.0000 mg | ORAL_TABLET | Freq: Every day | ORAL | Status: DC
  Administered 2021-09-16 – 2021-09-18 (×3): 20 mg via ORAL
  Filled 2021-09-15 (×3): qty 1

## 2021-09-15 MED ORDER — LEVALBUTEROL HCL 0.63 MG/3ML IN NEBU: 0.6300 mg | INHALATION_SOLUTION | RESPIRATORY_TRACT | Status: DC | PRN

## 2021-09-15 MED ORDER — ACETAMINOPHEN 325 MG PO TABS
650.0000 mg | ORAL_TABLET | Freq: Four times a day (QID) | ORAL | Status: DC | PRN
Start: 2021-09-15 — End: 2021-09-19

## 2021-09-15 MED ORDER — TAMSULOSIN HCL 0.4 MG PO CAPS
0.4000 mg | ORAL_CAPSULE | Freq: Every day | ORAL | Status: DC
  Administered 2021-09-16 – 2021-09-18 (×3): 0.4 mg via ORAL
  Filled 2021-09-15 (×3): qty 1

## 2021-09-15 MED ORDER — ONDANSETRON HCL 4 MG PO TABS: 4.0000 mg | ORAL_TABLET | Freq: Four times a day (QID) | ORAL | Status: DC | PRN

## 2021-09-15 MED ORDER — PANTOPRAZOLE 80MG IVPB - SIMPLE MED
80.0000 mg | Freq: Once | INTRAVENOUS | Status: AC
  Administered 2021-09-16: 80 mg via INTRAVENOUS
  Filled 2021-09-15: qty 80

## 2021-09-15 MED ORDER — OYSTER SHELL CALCIUM/D3 500-5 MG-MCG PO TABS
0.5000 | ORAL_TABLET | Freq: Every day | ORAL | Status: DC
  Administered 2021-09-17 – 2021-09-18 (×2): 0.5 via ORAL
  Filled 2021-09-15: qty 1
  Filled 2021-09-15: qty 0.5
  Filled 2021-09-15: qty 1

## 2021-09-15 MED ORDER — METOPROLOL TARTRATE 25 MG PO TABS
25.0000 mg | ORAL_TABLET | Freq: Two times a day (BID) | ORAL | Status: DC
  Administered 2021-09-16 – 2021-09-18 (×6): 25 mg via ORAL
  Filled 2021-09-15 (×6): qty 1

## 2021-09-15 MED ORDER — PANTOPRAZOLE INFUSION (NEW) - SIMPLE MED
8.0000 mg/h | INTRAVENOUS | Status: DC
  Administered 2021-09-16 (×2): 8 mg/h via INTRAVENOUS
  Filled 2021-09-15: qty 80
  Filled 2021-09-15 (×3): qty 100

## 2021-09-15 MED ORDER — IOHEXOL 300 MG/ML  SOLN
100.0000 mL | Freq: Once | INTRAMUSCULAR | Status: AC | PRN
  Administered 2021-09-15: 100 mL via INTRAVENOUS

## 2021-09-15 MED ORDER — ONDANSETRON HCL 4 MG/2ML IJ SOLN
4.0000 mg | Freq: Four times a day (QID) | INTRAMUSCULAR | Status: DC | PRN
Start: 2021-09-15 — End: 2021-09-19

## 2021-09-15 NOTE — Assessment & Plan Note (Signed)
Chronic, poor image quality on all recent echos. ?

## 2021-09-15 NOTE — ED Triage Notes (Signed)
Pt BIB [facility] from home with c/o increased SOB on exertion & when laying in bed, plus increased swelling in legs since he was in hospital recently with a Rt sided chest tube. Home health increased his lasix dose which did not help (per EMS). He is on 2L O2 baseline via n/c & was sating at 99%, 124/76, CBG 104, A/Ox4.  ?

## 2021-09-15 NOTE — ED Notes (Signed)
Pt oxygen saturation noted 88% on 2L supplemental oxygen via nasal cannula. RN assisted pt in repositioning and sitting more upright in bed and increased supplemental oxygen to 3L. Oxygen saturation increased to 100%.  ?

## 2021-09-15 NOTE — Assessment & Plan Note (Addendum)
Dark red blood per rectum with ABLA HGB 8.6 down from 11 baseline. ?1. Current suspicion is that this is driving his presenting symptoms of SOB and worsening peripheral edema today. ?2. Tele monitor ?3. Hold eliquis ?1. Reverse eliquis if needed, but bleeding rate at the moment doesn't seem to be that fast ?4. NPO after MN ?5. Repeat CBC in AM ?6. Type and screen ?7. Transfuse if needed ?8. Start PPI GTT ?9. CT AP ?10. Message sent to [facility] for AM consult. ?

## 2021-09-15 NOTE — Assessment & Plan Note (Signed)
Possibly due to volume overload? ?1. 80 of lasix in ED ?2. Repeat BMP in AM ?3. Re-dose lasix in AM depending on patient clinical status. ?

## 2021-09-15 NOTE — Assessment & Plan Note (Addendum)
1. Hold eliquis ?2. Cont BB ?

## 2021-09-15 NOTE — H&P (Addendum)
?History and Physical  ? ? ?Patient: Adam Briggs NLZ:[id number] DOB: 12/16/1929 ?DOA: 09/15/2021 ?DOS: the patient was seen and examined on 09/15/2021 ?PCP: [name], MD  ?Patient coming from: Home ? ?Chief Complaint:  ?Chief Complaint  ?Patient presents with  ? SOB  ? Edema  ? ?HPI: Adam Briggs is a 88 y.o. male with medical history significant of NSCLC s/p radiation therapy, A.Fib on eliquis, HTN, cirrhosis on imaging in Aug 2022. ? ?Echo in Aug = nl EF, abnormal AV ? ?Pt has history of recurrent R sided pleural effusion, unclear cause, is transudate and cytology negative for malignancy.  Due to age pt not a candidate for decortication.  Therefore pt had plurex catheter placed back in March 2023.  This gets drained daily at home. ? ?No change in drainage, but over past few days pt has had increased DOE, orthopnea, BLE edema. ? ?Lasix dose increased at home without benefit. ? ?Pt in to ED. ?  ?Review of Systems: As mentioned in the history of present illness. All other systems reviewed and are negative. ?Past Medical History:  ?Diagnosis Date  ? Atrial fibrillation ([place])   ? BENIGN PROSTATIC HYPERTROPHY   ? Cataract   ? DIVERTICULOSIS, COLON   ? DM   ? not diabetic-has been off metformin for about 2 years as of 09/2020 pr daughter  ? GERD   ? Heart murmur   ? Hernia   ? HYPERCHOLESTEROLEMIA   ? HYPERTENSION   ? Lung cancer ([place])   ? Moderate aortic stenosis 10/30/2011  ? echo 2013  ? Prostate cancer [facility])   ? s/p seed implant  ? Recurrent right pleural effusion   ? ?Past Surgical History:  ?Procedure Laterality Date  ? BRONCHIAL BIOPSY  10/17/2020  ? Procedure: BRONCHIAL BIOPSIES;  Surgeon: Garner Nash, DO;  Location: [place] [facility];  Service: Pulmonary;;  ? BRONCHIAL BRUSHINGS  10/17/2020  ? Procedure: BRONCHIAL BRUSHINGS;  Surgeon: Garner Nash, DO;  Location: [place];  Service: Pulmonary;;  ? BRONCHIAL NEEDLE ASPIRATION BIOPSY  10/17/2020  ? Procedure: BRONCHIAL NEEDLE ASPIRATION BIOPSIES;   Surgeon: Garner Nash, DO;  Location: [place];  Service: Pulmonary;;  ? BRONCHIAL WASHINGS  10/17/2020  ? Procedure: BRONCHIAL WASHINGS;  Surgeon: Garner Nash, DO;  Location: [place] [facility];  Service: Pulmonary;;  ? CATARACT EXTRACTION W/ INTRAOCULAR LENS IMPLANT  02/2013  ? R, then L 4 weeks later - [place]  ? CHEST TUBE INSERTION N/A 08/08/2021  ? Procedure: INSERTION PLEURAL DRAINAGE CATHETER;  Surgeon: Garner Nash, DO;  Location: [place] [facility];  Service: Pulmonary;  Laterality: N/A;  ? FIDUCIAL MARKER PLACEMENT  10/17/2020  ? Procedure: FIDUCIAL MARKER PLACEMENT;  Surgeon: Garner Nash, DO;  Location: [place] [facility];  Service: Pulmonary;;  ? INGUINAL HERNIA REPAIR    ? Right  ? INGUINAL HERNIA REPAIR Left 07/15/2014  ? Procedure: LEFT INGUINAL HERNIA REPAIR WITH MESH;  Surgeon: Armandina Gemma, MD;  Location: [facility];  Service: General;  Laterality: Left;  ? INSERTION OF MESH Left 07/15/2014  ? Procedure: INSERTION OF MESH;  Surgeon: Armandina Gemma, MD;  Location: [facility];  Service: General;  Laterality: Left;  ? RADIOACTIVE SEED IMPLANT    ? THORACENTESIS N/A 01/05/2021  ? Procedure: THORACENTESIS;  Surgeon: Candee Furbish, MD;  Location: [facility];  Service: Pulmonary;  Laterality: N/A;  ? VIDEO BRONCHOSCOPY WITH ENDOBRONCHIAL NAVIGATION Left 10/17/2020  ? Procedure: VIDEO BRONCHOSCOPY WITH ENDOBRONCHIAL NAVIGATION;  Surgeon: Garner Nash, DO;  Location: [facility];  Service: Pulmonary;  Laterality: Left;  ? VIDEO BRONCHOSCOPY WITH ENDOBRONCHIAL ULTRASOUND Bilateral 10/17/2020  ? Procedure: VIDEO BRONCHOSCOPY WITH ENDOBRONCHIAL ULTRASOUND;  Surgeon: Garner Nash, DO;  Location: [place];  Service: Pulmonary;  Laterality: Bilateral;  ? ?Social History:  reports that he quit smoking about 54 years ago. His smoking use included cigarettes. He has a 17.50 pack-year smoking history. He has never used smokeless tobacco. He reports that he does not drink alcohol and does not use drugs. ? ?Allergies   ?Allergen Reactions  ? Lisinopril Rash  ? ? ?Family History  ?Problem Relation Age of Onset  ? Heart attack Paternal Uncle   ?     in his 40s  ? Atrial fibrillation Daughter   ? Colon cancer Neg Hx   ? ? ?Prior to Admission medications   ?Medication Sig Start Date End Date Taking? Authorizing Provider  ?apixaban (ELIQUIS) 5 MG TABS tablet Take 1 tablet (5 mg total) by mouth 2 (two) times daily. 05/10/21   [name], MD  ?Calcium Carbonate-Vitamin D 600-10 MG-MCG TABS Take 0.5 tablets by mouth daily with breakfast.    [provider]  ?fluticasone (FLONASE) 50 MCG/ACT nasal spray Place 2 sprays into both nostrils daily. ?Patient taking differently: Place 2 sprays into both nostrils daily as needed for allergies. 08/04/18   [name], MD  ?furosemide (LASIX) 20 MG tablet TAKE 1 TABLET BY MOUTH EVERY DAY 08/31/21   [name], MD  ?levalbuterol [facility] HFA) 45 MCG/ACT inhaler Inhale 1 puff into the lungs every 4 (four) hours as needed for wheezing or shortness of breath. 01/17/21 01/17/22  [name], MD  ?Lidocaine, Anorectal, 5 % CREA Place 1 application. rectally daily as needed (hemorrhoids).    [provider]  ?metoprolol tartrate (LOPRESSOR) 25 MG tablet Take 1 tablet (25 mg total) by mouth 2 (two) times daily. 05/10/21   [name], MD  ?Multiple Vitamin (MULTIVITAMIN WITH MINERALS) TABS tablet Take 1 tablet by mouth in the morning. Centrum Silver for Men 50+    [provider]  ?Polyethylene Glycol 3350 (MIRALAX PO) Take 17 g by mouth daily as needed (constipation.).    [provider]  ?senna-docusate (SENOKOT-S) 8.6-50 MG tablet Take 1 tablet by mouth at bedtime as needed for mild constipation or moderate constipation. 05/10/21   [name], MD  ?simvastatin (ZOCOR) 20 MG tablet Take 1 tablet (20 mg total) by mouth daily at 6 PM. 11/01/20   [name], MD  ?spironolactone (ALDACTONE) 50 MG tablet  TAKE 1 TABLET DAILY 08/31/21   [name], MD  ?tamsulosin (FLOMAX) 0.4 MG CAPS capsule Take 0.4 mg by mouth in the morning. 07/21/20   [provider]  ? ? ?Physical Exam: ?Vitals:  ? 09/15/21 1845 09/15/21 1900 09/15/21 1952 09/15/21 2030  ?BP: 118/61 119/70  123/81  ?Pulse: 93 (!) 107 83 (!) 115  ?Resp: (!) 28 (!) 22 (!) 29 (!) 28  ?Temp:      ?TempSrc:      ?SpO2: 97% 100% 100% 91%  ? ?Constitutional: elderly, ill appearing ?Eyes: PERRL, lids and conjunctivae normal ?ENMT: Mucous membranes are moist. Posterior pharynx clear of any exudate or lesions.Normal dentition.  ?Neck: normal, supple, no masses, no thyromegaly ?Respiratory: Diminished on R, tachypnea, accessory muscle use present ?Cardiovascular: Tachycardic, irr, irr, 2-3+ BLE edema present ?Abdomen: no tenderness, no masses palpated. No hepatosplenomegaly. Bowel sounds positive.  ?Musculoskeletal: no clubbing /  cyanosis. No joint deformity upper and lower extremities. Good ROM, no contractures. Normal muscle tone.  ?Skin: no rashes, lesions, ulcers. No induration ?Neurologic: CN 2-12 grossly intact. Sensation intact, DTR normal. Strength 5/5 in all 4.  ?Psychiatric: Normal judgment and insight. Alert and oriented x 3. Normal mood.  ? ?Data Reviewed: ? ?CXR looks unchanged from prior ? ? ?  Latest Ref Rng & Units 09/15/2021  ?  6:22 PM 02/02/2021  ? 11:16 AM 01/15/2021  ?  4:59 AM  ?CBC  ?WBC 4.0 - 10.5 K/uL 6.8   6.2   5.5    ?Hemoglobin 13.0 - 17.0 g/dL 8.6   11.7   11.3    ?Hematocrit 39.0 - 52.0 % 28.9   37.2   38.0    ?Platelets 150 - 400 K/uL 188   161.0   188    ? ?Hemoccult positive ? ? ?  Latest Ref Rng & Units 09/15/2021  ?  6:22 PM 06/21/2021  ? 12:56 PM 02/02/2021  ? 11:16 AM  ?BMP  ?Glucose 70 - 99 mg/dL 101    127    ?BUN 8 - 23 mg/dL 34    31    ?Creatinine 0.61 - 1.24 mg/dL 1.10   1.40   1.32    ?Sodium 135 - 145 mmol/L 126    139    ?Potassium 3.5 - 5.1 mmol/L 4.8    4.7    ?Chloride 98 - 111 mmol/L 86    97    ?CO2 22 - 32  mmol/L 32    37    ?Calcium 8.9 - 10.3 mg/dL 8.9    10.0    ? ? ? ?Assessment and Plan: ?* GI bleed ?Dark red blood per rectum with ABLA HGB 8.6 down from 11 baseline. ?Current suspicion is that this is

## 2021-09-15 NOTE — ED Provider Notes (Signed)
?[place] ?Provider Note ? ? ?CSN: [id number] ?Arrival date & time: 09/15/21  1810 ? ?  ? ?History ? ?No chief complaint on file. ? ? ?Adam Briggs is a 88 y.o. male. ? ?HPI ? ?This patient is a very pleasant 88 year old male who takes Eliquis, furosemide, Xopenex, metoprolol and simvastatin as well as spironolactone.  He is known to have multiple different medical problems including having a Pleurx catheter that was placed in March secondary to a trapped lung and a chronic pleural effusion.  This was on the right side. ? ?Pulmonology notes from the beginning of March of this year states that the patient has had a recurrent loculated right-sided pleural effusion with a malignant neoplasm of the upper lobe of the left lung, he is under surveillance with oncology but not getting active treatment.  He is known to have hypertension aortic stenosis atrial fibrillation cirrhosis and diabetes. ? ?The patient reports to me that his daughter is the one who is taking the fluid off of the Pleurx catheter, despite that he has had some increasing shortness of breath especially with exertion and with laying down, this is getting worse, he was found to have fairly normal vital signs except for tachycardia, he was not febrile nor was he hypoxic, the family members noted some increased edema of his legs and the home health nurse has recently increased the Lasix dose for him but this has not helped. ? ?Echocardiogram from August 2022 showed that the patient had an abnormal aortic valve with an ejection fraction of 65 to 70%, there was a large pleural effusion ? ?Home Medications ?Prior to Admission medications   ?Medication Sig Start Date End Date Taking? Authorizing Provider  ?apixaban (ELIQUIS) 5 MG TABS tablet Take 1 tablet (5 mg total) by mouth 2 (two) times daily. 05/10/21   [name], MD  ?Calcium Carbonate-Vitamin D 600-10 MG-MCG TABS Take 0.5 tablets by mouth daily with  breakfast.    [provider]  ?fluticasone (FLONASE) 50 MCG/ACT nasal spray Place 2 sprays into both nostrils daily. ?Patient taking differently: Place 2 sprays into both nostrils daily as needed for allergies. 08/04/18   [name], MD  ?furosemide (LASIX) 20 MG tablet TAKE 1 TABLET BY MOUTH EVERY DAY 08/31/21   [name], MD  ?levalbuterol [facility] HFA) 45 MCG/ACT inhaler Inhale 1 puff into the lungs every 4 (four) hours as needed for wheezing or shortness of breath. 01/17/21 01/17/22  [name], MD  ?Lidocaine, Anorectal, 5 % CREA Place 1 application. rectally daily as needed (hemorrhoids).    [provider]  ?metoprolol tartrate (LOPRESSOR) 25 MG tablet Take 1 tablet (25 mg total) by mouth 2 (two) times daily. 05/10/21   [name], MD  ?Multiple Vitamin (MULTIVITAMIN WITH MINERALS) TABS tablet Take 1 tablet by mouth in the morning. Centrum Silver for Men 50+    [provider]  ?Polyethylene Glycol 3350 (MIRALAX PO) Take 17 g by mouth daily as needed (constipation.).    [provider]  ?senna-docusate (SENOKOT-S) 8.6-50 MG tablet Take 1 tablet by mouth at bedtime as needed for mild constipation or moderate constipation. 05/10/21   [name], MD  ?simvastatin (ZOCOR) 20 MG tablet Take 1 tablet (20 mg total) by mouth daily at 6 PM. 11/01/20   [name], MD  ?spironolactone (ALDACTONE) 50 MG tablet TAKE 1 TABLET DAILY 08/31/21   [name], MD  ?tamsulosin [facility]) 0.4  MG CAPS capsule Take 0.4 mg by mouth in the morning. 07/21/20   [provider]  ?   ? ?Allergies    ?Lisinopril   ? ?Review of Systems   ?Review of Systems  ?All other systems reviewed and are negative. ? ?Physical Exam ?Updated Vital Signs ?There were no vitals taken for this visit. ?Physical Exam ?Vitals and nursing note reviewed.  ?Constitutional:   ?   General: He is in acute distress.  ?   Appearance: He is well-developed.   ?HENT:  ?   Head: Normocephalic and atraumatic.  ?   Mouth/Throat:  ?   Pharynx: No oropharyngeal exudate.  ?Eyes:  ?   General: No scleral icterus.    ?   Right eye: No discharge.     ?   Left eye: No discharge.  ?   Conjunctiva/sclera: Conjunctivae normal.  ?   Pupils: Pupils are equal, round, and reactive to light.  ?Neck:  ?   Thyroid: No thyromegaly.  ?   Vascular: No JVD.  ?Cardiovascular:  ?   Rate and Rhythm: Regular rhythm. Tachycardia present.  ?   Heart sounds: Normal heart sounds. No murmur heard. ?  No friction rub. No gallop.  ?Pulmonary:  ?   Effort: Respiratory distress present.  ?   Breath sounds: Normal breath sounds. No wheezing or rales.  ?   Comments: The patient is tachypneic and has decreased lung sounds two thirds of the way up the right side of the chest, his left chest is diminished but audible breath sounds are present, there is some expiratory wheezing.  Dullness to percussion in the right hemithorax, Pleurx catheter is present without surrounding redness bleeding drainage or foul smell ?Abdominal:  ?   General: Bowel sounds are normal. There is no distension.  ?   Palpations: Abdomen is soft. There is no mass.  ?   Tenderness: There is no abdominal tenderness.  ?Musculoskeletal:     ?   General: No tenderness. Normal range of motion.  ?   Cervical back: Normal range of motion and neck supple.  ?   Right lower leg: Edema present.  ?   Left lower leg: Edema present.  ?Lymphadenopathy:  ?   Cervical: No cervical adenopathy.  ?Skin: ?   General: Skin is warm and dry.  ?   Findings: No erythema or rash.  ?Neurological:  ?   Mental Status: He is alert.  ?   Coordination: Coordination normal.  ?Psychiatric:     ?   Behavior: Behavior normal.  ? ? ?ED Results / Procedures / Treatments   ?Labs ?(all labs ordered are listed, but only abnormal results are displayed) ?Labs Reviewed - No data to display ? ?EKG ?None ? ?Radiology ?No results found. ? ?Procedures ?Procedures  ? ? ?Medications Ordered  in ED ?Medications - No data to display ? ?ED Course/ Medical Decision Making/ A&P ?  ?                        ?Medical Decision Making ?Amount and/or Complexity of Data Reviewed ?Labs: ordered. ?Radiology: ordered. ? ?Risk ?Prescription drug management. ?Decision regarding hospitalization. ? ? ?This patient presents to the ED for concern of increasing shortness of breath, he has decreased lung sounds on the right which could be related to progressive effusion, less likely to be related pneumothorax., this involves an extensive number of treatment options, and is a complaint that carries with it  a high risk of complications and morbidity.  The differential diagnosis includes pneumothorax, effusion, pneumonia, congestive heart failure.  The patient does have bilateral lower extremity edema which is concerning but in the presence of cirrhosis this is not unusual. ? ? ?Co morbidities that complicate the patient evaluation ? ?Cirrhosis, diabetes, aortic stenosis ? ? ?Additional history obtained: ? ?Additional history obtained from electronic medical record as well as the paramedic ?External records from outside source obtained and reviewed including prior pulmonary office notes as well as procedure notes for Pleurx catheter placement in March ? ? ?Lab Tests: ? ?I Ordered, and personally interpreted labs.  The pertinent results include: Anemia Hemoccult positive and hyponatremia ? ? ?Imaging Studies ordered: ? ?I ordered imaging studies including portable chest x-ray which shows likely effusion on the right side, Pleurx is in place ?I independently visualized and interpreted imaging which showed agree with those findings ?I agree with the radiologist interpretation ? ? ?Cardiac Monitoring: / EKG: ? ?The patient was maintained on a cardiac monitor.  I personally viewed and interpreted the cardiac monitored which showed an underlying rhythm of: Sinus rhythm, borderline tachycardia ? ? ?Consultations Obtained: ? ?I  requested consultation with the hospitalist,  and discussed lab and imaging findings as well as pertinent plan - they recommend: Admission to the hospital ? ? ?Problem List / ED Course / Critical interventions /

## 2021-09-15 NOTE — Assessment & Plan Note (Addendum)
1. Plurex catheter in place, cont to drain ?2. CXR unchanged from priors. ?3. Cause of effusion not entirely clear: ?1. Transudative on labs in Aug ?2. Cytology neg for malignant cells ?4. Not candidate for decortication due to age. ?5. Follows with pulm office. ?

## 2021-09-15 NOTE — Assessment & Plan Note (Signed)
Hold aldactone ?Cont metoprolol for A.Fib rate control ?

## 2021-09-15 NOTE — Assessment & Plan Note (Addendum)
Pt with tachypnea and increased WOB with accessory muscle use. ?1. Cont O2 ?2. Lasix 80mg  so far in ED, pt with UOP ?3. Will have RN drain plurex catheter ?4. BIPAP PRN if needed ?

## 2021-09-15 NOTE — Assessment & Plan Note (Addendum)
Not currently undergoing chemo, completed radiation. ?Sounds like this had been mostly stable on follow up CTs ?Though Feb CT this year did show 3 new small liver lesions / hypo densities, no IVC dye used though. ?

## 2021-09-15 NOTE — Assessment & Plan Note (Signed)
Recently diagnosed in Aug 2022 ?No evidence of portal HTN on imaging at that time though ?1. CT AP today pending ?2. Variceal bleed seems less likely at this time. ?

## 2021-09-16 DIAGNOSIS — D6832 Hemorrhagic disorder due to extrinsic circulating anticoagulants: Secondary | ICD-10-CM | POA: Diagnosis present

## 2021-09-16 DIAGNOSIS — D490 Neoplasm of unspecified behavior of digestive system: Secondary | ICD-10-CM | POA: Diagnosis not present

## 2021-09-16 DIAGNOSIS — Z8546 Personal history of malignant neoplasm of prostate: Secondary | ICD-10-CM | POA: Diagnosis not present

## 2021-09-16 DIAGNOSIS — E8809 Other disorders of plasma-protein metabolism, not elsewhere classified: Secondary | ICD-10-CM | POA: Diagnosis present

## 2021-09-16 DIAGNOSIS — K921 Melena: Secondary | ICD-10-CM

## 2021-09-16 DIAGNOSIS — E7800 Pure hypercholesterolemia, unspecified: Secondary | ICD-10-CM | POA: Diagnosis present

## 2021-09-16 DIAGNOSIS — I4891 Unspecified atrial fibrillation: Secondary | ICD-10-CM | POA: Diagnosis not present

## 2021-09-16 DIAGNOSIS — I4819 Other persistent atrial fibrillation: Secondary | ICD-10-CM | POA: Diagnosis present

## 2021-09-16 DIAGNOSIS — J90 Pleural effusion, not elsewhere classified: Secondary | ICD-10-CM | POA: Diagnosis present

## 2021-09-16 DIAGNOSIS — Z85118 Personal history of other malignant neoplasm of bronchus and lung: Secondary | ICD-10-CM | POA: Diagnosis not present

## 2021-09-16 DIAGNOSIS — Z8249 Family history of ischemic heart disease and other diseases of the circulatory system: Secondary | ICD-10-CM | POA: Diagnosis not present

## 2021-09-16 DIAGNOSIS — Z9841 Cataract extraction status, right eye: Secondary | ICD-10-CM | POA: Diagnosis not present

## 2021-09-16 DIAGNOSIS — D500 Iron deficiency anemia secondary to blood loss (chronic): Secondary | ICD-10-CM | POA: Diagnosis not present

## 2021-09-16 DIAGNOSIS — I472 Ventricular tachycardia, unspecified: Secondary | ICD-10-CM | POA: Diagnosis not present

## 2021-09-16 DIAGNOSIS — K219 Gastro-esophageal reflux disease without esophagitis: Secondary | ICD-10-CM | POA: Diagnosis present

## 2021-09-16 DIAGNOSIS — E871 Hypo-osmolality and hyponatremia: Secondary | ICD-10-CM

## 2021-09-16 DIAGNOSIS — I10 Essential (primary) hypertension: Secondary | ICD-10-CM | POA: Diagnosis present

## 2021-09-16 DIAGNOSIS — T45515A Adverse effect of anticoagulants, initial encounter: Secondary | ICD-10-CM | POA: Diagnosis present

## 2021-09-16 DIAGNOSIS — Z923 Personal history of irradiation: Secondary | ICD-10-CM | POA: Diagnosis not present

## 2021-09-16 DIAGNOSIS — Z9842 Cataract extraction status, left eye: Secondary | ICD-10-CM | POA: Diagnosis not present

## 2021-09-16 DIAGNOSIS — R4182 Altered mental status, unspecified: Secondary | ICD-10-CM | POA: Diagnosis not present

## 2021-09-16 DIAGNOSIS — J9621 Acute and chronic respiratory failure with hypoxia: Secondary | ICD-10-CM | POA: Diagnosis present

## 2021-09-16 DIAGNOSIS — K625 Hemorrhage of anus and rectum: Secondary | ICD-10-CM | POA: Diagnosis not present

## 2021-09-16 DIAGNOSIS — Z7901 Long term (current) use of anticoagulants: Secondary | ICD-10-CM | POA: Diagnosis not present

## 2021-09-16 DIAGNOSIS — C3412 Malignant neoplasm of upper lobe, left bronchus or lung: Secondary | ICD-10-CM | POA: Diagnosis not present

## 2021-09-16 DIAGNOSIS — C20 Malignant neoplasm of rectum: Secondary | ICD-10-CM | POA: Diagnosis present

## 2021-09-16 DIAGNOSIS — I493 Ventricular premature depolarization: Secondary | ICD-10-CM | POA: Diagnosis not present

## 2021-09-16 DIAGNOSIS — I480 Paroxysmal atrial fibrillation: Secondary | ICD-10-CM | POA: Diagnosis not present

## 2021-09-16 DIAGNOSIS — K922 Gastrointestinal hemorrhage, unspecified: Secondary | ICD-10-CM | POA: Diagnosis present

## 2021-09-16 DIAGNOSIS — K648 Other hemorrhoids: Secondary | ICD-10-CM | POA: Diagnosis present

## 2021-09-16 DIAGNOSIS — D62 Acute posthemorrhagic anemia: Secondary | ICD-10-CM | POA: Diagnosis present

## 2021-09-16 DIAGNOSIS — K7469 Other cirrhosis of liver: Secondary | ICD-10-CM | POA: Diagnosis present

## 2021-09-16 DIAGNOSIS — K6289 Other specified diseases of anus and rectum: Secondary | ICD-10-CM | POA: Diagnosis not present

## 2021-09-16 DIAGNOSIS — I350 Nonrheumatic aortic (valve) stenosis: Secondary | ICD-10-CM | POA: Diagnosis present

## 2021-09-16 LAB — CBC
HCT: 27.9 % — ABNORMAL LOW (ref 39.0–52.0)
Hemoglobin: 8.6 g/dL — ABNORMAL LOW (ref 13.0–17.0)
MCH: 25.6 pg — ABNORMAL LOW (ref 26.0–34.0)
MCHC: 30.8 g/dL (ref 30.0–36.0)
MCV: 83 fL (ref 80.0–100.0)
Platelets: 184 10*3/uL (ref 150–400)
RBC: 3.36 MIL/uL — ABNORMAL LOW (ref 4.22–5.81)
RDW: 15 % (ref 11.5–15.5)
WBC: 6.5 10*3/uL (ref 4.0–10.5)
nRBC: 0 % (ref 0.0–0.2)

## 2021-09-16 LAB — BASIC METABOLIC PANEL
Anion gap: 7 (ref 5–15)
BUN: 31 mg/dL — ABNORMAL HIGH (ref 8–23)
CO2: 39 mmol/L — ABNORMAL HIGH (ref 22–32)
Calcium: 8.9 mg/dL (ref 8.9–10.3)
Chloride: 83 mmol/L — ABNORMAL LOW (ref 98–111)
Creatinine, Ser: 1.1 mg/dL (ref 0.61–1.24)
GFR, Estimated: >60 mL/min (ref >60)
Glucose, Bld: 87 mg/dL (ref 70–99)
Potassium: 4.3 mmol/L (ref 3.5–5.1)
Sodium: 129 mmol/L — ABNORMAL LOW (ref 135–145)

## 2021-09-16 LAB — IRON AND TIBC
Iron: 23 ug/dL — ABNORMAL LOW (ref 45–182)
Saturation Ratios: 6 % — ABNORMAL LOW (ref 17.9–39.5)
TIBC: 414 ug/dL (ref 250–450)
UIBC: 391 ug/dL

## 2021-09-16 MED ORDER — MELATONIN 5 MG PO TABS
10.0000 mg | ORAL_TABLET | Freq: Every evening | ORAL | Status: DC | PRN
Start: 2021-09-16 — End: 2021-09-19
  Administered 2021-09-16 – 2021-09-17 (×2): 10 mg via ORAL
  Filled 2021-09-16 (×2): qty 2

## 2021-09-16 MED ORDER — POLYETHYLENE GLYCOL 3350 17 G PO PACK
17.0000 g | PACK | Freq: Every day | ORAL | Status: DC
  Administered 2021-09-16 – 2021-09-17 (×2): 17 g via ORAL
  Filled 2021-09-16 (×2): qty 1

## 2021-09-16 NOTE — Progress Notes (Signed)
PT Cancellation Note ? ?Patient Details ?Name: Adam Briggs ?MRN: [id number] ?DOB: [birth year] ? ? ?Cancelled Treatment:    Reason Eval/Treat Not Completed: Patient not medically ready. Upon arrival, monitor alarming "Vtach" with waveforms noted to be consistent with Vtach, but pt denying any symptoms. RN made aware and monitoring pt. Will plan to hold PT today and follow-up tomorrow as able. ? ? ?Moishe Spice, PT, DPT ?Acute Rehabilitation Services  ?Pager: [phone] ?Office: [phone] ? ? ? ?Maretta Bees Pettis ?09/16/2021, 3:06 PM ? ? ?

## 2021-09-16 NOTE — Consult Note (Addendum)
? ?                                            Consultation Note ? ? ?Referring Provider: [facility] ?PCP: Adam Koch, MD ?Primary Gastroenterologist: Adam Shorts, MD  ?Reason for consultation:  Rectal bleeding  ?Hospital Day: 2 ? ?ASSESSMENT:  ? ?88 yo male with intermittent painless rectal bleeding with bowel movements for the last few months. On Eliquis ?Bleeding heavy at times.  Abnormal DRE with significant anorectal thickening which could be hemorrhoidal tissue, inflammation / scarring from radiation proctitis or mass  ? ?[place] anemia ?Presumably related to the rectal bleeding with a 3 gram drop in hgb from 11.7 to 8.6.  ?Chronic constipation.  ?Takes Senna and miralax at home ? ?History of colon polyps ?Two subcentimeter tubular adenomas removed in 2013 ? ?AFIB, home Eliquis is on hold ? ?R sided pleural cath ?Recent pleural effusion of unknown cause. Daughter drains pleural cavity daily ? ?Several low attenuation liver lesions on CT scan. MRI in Feb 2023 to evaluate these lesions showed them to be small hepatic cysts. ? ?See [facility] for additional medical problems ? ? ?PLAN:  ? ?Will need lower endoscopy, possibly just a flexible sigmoidoscopy after Eliquis washout. If feels okay this could possibly be done tomorrow.  ?Will give daily Miralax (currently nothing ordered) ?Monitor hgb, transfuse if needed ? ? ?Attending Physician Note  ? ?I have taken a history, reviewed the chart and examined the patient. I performed more than 50% of this encounter in conjunction with the APP. I agree with the APP's note, impression and recommendations with my edits. My additional impressions and recommendations are as follows.  ? ?Hematochezia, anemia, chronic constipation, thickened tissue on DRE and afib on Eliquis. R/O hemorrhoids, radiation proctitis, stercoral ulcer, neoplasm. Hold Eliquis, trend CBC, Miralax qd to bid, flexible sigmoidoscopy with Dr. Havery Briggs tomorrow.  ? ?Adam Edward, MD [facility] ?See  [facility], [facility], for our on call provider  ? ? ? ? ?History of Present Illness:  ?Adam Briggs is a 88 y.o. male known to Dr. Henrene Pastor with a past medical history significant for non-small cell lung cancer s/p radion, AFIB on Eliquis, HTN, DM, HLD, prostate cancer s/p see implantation., small bowel obstruction,  colon polyps.   See PMH for any additional medical problems. ? ?Recently hospitalized .with R sided pleural effusion of unclear cause but not malignant. Had plerex placed.    ? ?Patient presented to ED 4/28 for evaluation of SOB, swelling in legs. In ED had dark red blood per rectum and acute blood loss anemia with dro in hgb from 11 to 8.8. Eliquis put on hold  ? ?INTERVAL HISTORY:  ?Patient's daughter is in the room and helps with history. Patient is a good historian himself. He has been having intermittent painless rectal bleeding with bowel movements for a few months. Sometimes bleeding is heavy. Using topical steroids for presumed hemorrhoid bleeding and it does help but hasn't solved the issue. He struggles with constipation. Takes Senna everyday and miralax as needed. Daughter recently increased Senna to two a day.  ? ?[name] appetite has been poor the last few days. One episode of vomiting on Friday but generally no nausea / vomiting. No associated weight loss. He hasn't had any energy which is unusual for him. In mid Feb 2023 a CT scan showed several  liver lesions. Follow up MRI showed liver which did not appear cirrhotic. No suspicious liver lesions, some hepatic cysts.  ? ? ? ?Previous GI Evaluation / History   ?September 2013 screening colonoscopy ?Complete exam, excellent bowel prep ?5 mm transverse polyp.  Pedunculated 10 mm rectal polyp.  Mild sigmoid diverticulosis ? ?Surgical [P], transverse and rectum, polyp ?- TUBULAR ADENOMA. ?- NEGATIVE FOR HIGH GRADE DYSPLASIA. ? ? ?Recent Labs and Imaging ?CT ABDOMEN PELVIS W CONTRAST ? ?Result Date: 09/15/2021 ?CLINICAL DATA:  Suspected bowel  obstruction. EXAM: CT ABDOMEN AND PELVIS WITH CONTRAST TECHNIQUE: Multidetector CT imaging of the abdomen and pelvis was performed using the standard protocol following bolus administration of intravenous contrast. RADIATION DOSE REDUCTION: This exam was performed according to the departmental dose-optimization program which includes automated exposure control, adjustment of the mA and/or kV according to patient size and/or use of iterative reconstruction technique. CONTRAST:  130mL OMNIPAQUE IOHEXOL 300 MG/ML  SOLN COMPARISON:  July 17, 2007 FINDINGS: Lower chest: Moderate to marked severity areas of linear scarring and/or atelectasis are seen within the lingular region, bilateral lower lobes and visualized portion of the right middle lobe. Large areas of consolidation are seen within the posterior and lateral aspects of the right lower lobe. There are small bilateral pleural effusions with a loculated component seen on the right. This contains a surgical drain which enters via the posterolateral aspect of the lower right chest wall. Hepatobiliary: Several well-defined areas of parenchymal low attenuation are seen within the right lobe of the liver. The largest measures approximately 1.7 cm x 1.0 cm. No gallstones, gallbladder wall thickening, or biliary dilatation. Pancreas: Unremarkable. No pancreatic ductal dilatation or surrounding inflammatory changes. Spleen: Normal in size without focal abnormality. Adrenals/Urinary Tract: Adrenal glands are unremarkable. Kidneys are normal in size, without renal calculi or hydronephrosis. A 1.2 cm simple cyst is seen within the posterior aspect of the mid right kidney. A similar appearing 3.2 cm simple cyst is seen within the lower pole of the left kidney. No additional follow-up or imaging is recommended. Bladder is unremarkable. Stomach/Bowel: Stomach is within normal limits. Appendix appears normal. No evidence of bowel wall thickening, distention, or inflammatory  changes. A large noninflamed diverticulum is seen along the junction of the distal descending and proximal sigmoid colon. Vascular/Lymphatic: Aortic atherosclerosis. No enlarged abdominal or pelvic lymph nodes. Reproductive: Multiple prostate radiation implantation seeds are seen. Other: No abdominal wall hernia or abnormality. No abdominopelvic ascites. Musculoskeletal: Multilevel degenerative changes are seen throughout the lumbar spine. IMPRESSION: 1. Moderate to marked severity post lingular, bilateral lower lobe and right middle lobe linear scarring and/or atelectasis. 2. Large areas of consolidation within the posterior and lateral aspects of the right lower lobe which may represent pneumonia. Follow-up to resolution is recommended, as an underlying neoplastic process cannot be excluded. 3. Small bilateral pleural effusions with a loculated component and associated pleural drainage catheter seen on the right. 4. Findings which may represent small hepatic cysts versus hemangiomas. Correlation with non emergent hepatic ultrasound is recommended. 5. Large noninflamed diverticulum along the junction of the distal descending and proximal sigmoid colon. 6. Multiple prostate radiation implantation seeds. 7. Aortic atherosclerosis. Aortic Atherosclerosis (ICD10-I70.0). Electronically Signed   By: Virgina Norfolk M.D.   On: 09/15/2021 22:26  ? ?DG Chest Port 1 View ? ?Result Date: 09/15/2021 ?CLINICAL DATA:  Cough, shortness of breath EXAM: PORTABLE CHEST 1 VIEW COMPARISON:  Chest radiograph dated 07/25/2021. CT chest dated 06/21/2021. FINDINGS: Moderate chronic right pleural effusion with associated  pleural drain. Scattered right lung scarring/atelectasis with volume loss in the right hemithorax. Rightward cardiomediastinal shift. These findings are unchanged. Left lung is essentially clear, noting mild left basilar atelectasis. The heart is normal in size. IMPRESSION: Chronic right pleural effusion with pleural  drain. Scattered right lung scarring/atelectasis with volume loss in the right hemithorax. These findings are unchanged when compared to the prior. Electronically Signed   By: Julian Hy M.D.   On: 09/15/2021

## 2021-09-16 NOTE — Progress Notes (Signed)
Anessa with PT voiced concerns to this RN that patient was having [facility] alarms. Patient is having nonsustained Vtach, HR is 90-100. Patient is asymptomatic. I administered regular scheduled metoprolol oral dose. Dose was delayed due to blood pressure concerns and having had a dose at 2AM.  ?BP has remained stable, see flowsheets.  ?RN, Therapist, Daughter [name] and Patient Dimitrious agreed to hold on therapy at this time.  ?

## 2021-09-16 NOTE — Progress Notes (Signed)
Bedside cardiac monitor alarming for ST elevation in some leads. Patient is lying in bed, eyes open, orientedx4. He denies chest pain, shob, nausea, indigestion. He denies pain in other regions of the body as well.  ?BP is 99/69 HR 81, 100% on 3L [place]. RR 18 ?EKG Obtained - Dr. Avon Gully made aware of assessment. ? ?

## 2021-09-16 NOTE — Progress Notes (Addendum)
?PROGRESS NOTE ? ? ? ?Adam Briggs  YWV:[id number] DOB: [birth year] DOA: 09/15/2021 ?PCP: [name], MD ? ? ?Brief Narrative:  ?Adam Briggs is a 88 y.o. male with medical history significant of NSCLC s/p radiation therapy, A.Fib on eliquis, HTN, cirrhosis on imaging in Aug 2022. Pt has history of recurrent R sided pleural effusion, unclear cause, is transudate and cytology negative for malignancy -now with chronic indwelling pleural catheter.  Unfortunately due to age pt not a candidate for decortication. Family noted weight gain, orthopnea dyspnea on exertion worsening over the past few weeks evaluated in the outpatient setting with worsening anemia, evaluated in the ED with positive FOBT.  GI called for consult.  Hospitalist called for admission. ? ?**Afternoon update - patient has occasional runs of completely asymptomatic afib with occasional PVC read as 'vtach' on tele monitor with reassuring EKG confirming afib with occasional PVC. Currently heart rate remains controlled and again patient remains without symptoms. No current indication for troponin or further cardiac at this juncture. Family continues to voice concern over aberrant telemetry findings despite reassurances. ? ?Assessment & Plan: ?  ?Principal Problem: ?  GI bleed ?Active Problems: ?  Acute on chronic respiratory failure with hypoxia (HCC) ?  ABLA (acute blood loss anemia) ?  Atrial fibrillation ([place]) ?  Recurrent right pleural effusion ?  Malignant neoplasm of upper lobe of left lung ([place]) ?  Other cirrhosis of liver ([place]) ?  Hyponatremia ?  Essential hypertension ?  Moderate aortic stenosis ? ?Acute symptomatic anemia secondary to ongoing GI bleed ?-Dark red blood per rectum with Hgb 8.6 down from 11 baseline (last labs 7 months ago)  ?-Hgb stabilizing at 8.6 ?-GI consulted, appreciate insight and recommendations ?-Iron panel pending, unclear if patient has acute blood loss anemia, anemia of chronic disease versus anemia of  malnutrition and iron deficiency -likely multifactorial per discussion with family at bedside ?-Continue to hold anticoagulation, chronically on Eliquis as below ?-N.p.o. until cleared by GI ?-PPI drip ongoing ?  ?Acute on chronic respiratory failure with hypoxia (HCC) ?Secondary to recurrent pleural effusion ?Continue supplemental oxygen ?-Status post Lasix x1, follow clinically -currently euvolemic ?-Continue to drain Pleurx catheter daily per previous recommendations, clear amber fluid withdrawn today ?  ?Hypervolemic hyponatremia ?-Improving with diuresis, continue to follow clinically ?  ?Other cirrhosis of liver ([place]) ?Recently diagnosed in Aug 2022 ?No evidence of portal HTN on imaging at that time though ?  ?Malignant neoplasm of upper lobe of left lung ([place]) ?Not currently undergoing chemo, completed radiation. ?Sounds like this had been mostly stable on follow up CTs ?Though Feb CT this year did show 3 new small liver lesions / hypo densities, no IVC dye used though. ?  ?Atrial fibrillation ([place]) ?Hold eliquis ?Cont BB ?  ?Moderate aortic stenosis ?Chronic, asymptomatic, continue screening per cardiology outpatient ? ?Essential hypertension ?Holding spironolactone in the setting of diuretics as above  ?Continue metoprolol for A.Fib rate control ? ?DVT prophylaxis: Early ambulation, SCDs given above no chemical prophylaxis ?Code Status: Full ?Family Communication: At bedside son and daughter ? ?Status is: Inpatient ? ?Dispo: The patient is from: Home ?             Anticipated d/c is to: To be determined ?             Anticipated d/c date is: 24 to 48 hours ?             Patient currently not medically stable for discharge ? ?  Consultants:  ?GI ? ?Procedures:  ?Endoscopy pending above ? ?Antimicrobials:  ?None indicated ? ?Subjective: ?No acute issues or events overnight ? ?Objective: ?Vitals:  ? 09/16/21 0109 09/16/21 2035 09/16/21 5974 09/16/21 0717  ?BP: 117/71 (!) 90/54 (!) 93/52   ?Pulse: (!) 109 96 82  93  ?Resp: (!) 23 (!) 21  18  ?Temp: 98 ?F (36.7 ?C) 97.7 ?F (36.5 ?C)    ?TempSrc: Oral Oral    ?SpO2: 100% 100%  100%  ?Weight: 69.7 kg     ?Height: 5\' 10"  (1.778 m)     ? ? ?Intake/Output Summary (Last 24 hours) at 09/16/2021 0719 ?Last data filed at 09/16/2021 0719 ?Gross per 24 hour  ?Intake 116.6 ml  ?Output 350 ml  ?Net -233.4 ml  ? ?Filed Weights  ? 09/16/21 0109  ?Weight: 69.7 kg  ? ? ?Examination: ? ?General:  Pleasantly resting in bed, No acute distress. ?Lungs: Right posterior Pleurx catheter clean dry intact ?Heart:  Regular rate and rhythm.  Without murmurs, rubs, or gallops. ?Abdomen:  Soft, nontender, nondistended.  Without guarding or rebound. ?Extremities: Without cyanosis, clubbing, edema, or obvious deformity. ? ?Data Reviewed: I have personally reviewed following labs and imaging studies ? ?CBC: ?Recent Labs  ?Lab 09/15/21 ?1822 09/16/21 ?0241  ?WBC 6.8 6.5  ?NEUTROABS 5.6  --   ?HGB 8.6* 8.6*  ?HCT 28.9* 27.9*  ?MCV 85.5 83.0  ?PLT 188 184  ? ?Basic Metabolic Panel: ?Recent Labs  ?Lab 09/15/21 ?1822 09/16/21 ?0241  ?NA 126* 129*  ?K 4.8 4.3  ?CL 86* 83*  ?CO2 32 39*  ?GLUCOSE 101* 87  ?BUN 34* 31*  ?CREATININE 1.10 1.10  ?CALCIUM 8.9 8.9  ?MG 1.7  --   ? ?GFR: ?Estimated Creatinine Clearance: 44 mL/min (by C-G formula based on SCr of 1.1 mg/dL). ?Liver Function Tests: ?Recent Labs  ?Lab 09/15/21 ?1822  ?AST 16  ?ALT 15  ?ALKPHOS 41  ?BILITOT 0.5  ?PROT 5.2*  ?ALBUMIN 2.8*  ? ?No results for input(s): LIPASE, AMYLASE in the last 168 hours. ?No results for input(s): AMMONIA in the last 168 hours. ?Coagulation Profile: ?Recent Labs  ?Lab 09/15/21 ?1822  ?INR 1.4*  ? ?Cardiac Enzymes: ?No results for input(s): CKTOTAL, CKMB, CKMBINDEX, TROPONINI in the last 168 hours. ?BNP (last 3 results) ?No results for input(s): PROBNP in the last 8760 hours. ?HbA1C: ?No results for input(s): HGBA1C in the last 72 hours. ?CBG: ?No results for input(s): GLUCAP in the last 168 hours. ?Lipid Profile: ?No results for  input(s): CHOL, HDL, LDLCALC, TRIG, CHOLHDL, LDLDIRECT in the last 72 hours. ?Thyroid Function Tests: ?No results for input(s): TSH, T4TOTAL, FREET4, T3FREE, THYROIDAB in the last 72 hours. ?Anemia Panel: ?No results for input(s): VITAMINB12, FOLATE, FERRITIN, TIBC, IRON, RETICCTPCT in the last 72 hours. ?Sepsis Labs: ?No results for input(s): PROCALCITON, LATICACIDVEN in the last 168 hours. ? ?No results found for this or any previous visit (from the past 240 hour(s)).  ? ? ? ? ? ?Radiology Studies: ?CT ABDOMEN PELVIS W CONTRAST ? ?Result Date: 09/15/2021 ?CLINICAL DATA:  Suspected bowel obstruction. EXAM: CT ABDOMEN AND PELVIS WITH CONTRAST TECHNIQUE: Multidetector CT imaging of the abdomen and pelvis was performed using the standard protocol following bolus administration of intravenous contrast. RADIATION DOSE REDUCTION: This exam was performed according to the departmental dose-optimization program which includes automated exposure control, adjustment of the mA and/or kV according to patient size and/or use of iterative reconstruction technique. CONTRAST:  151mL OMNIPAQUE IOHEXOL 300 MG/ML  SOLN  COMPARISON:  July 17, 2007 FINDINGS: Lower chest: Moderate to marked severity areas of linear scarring and/or atelectasis are seen within the lingular region, bilateral lower lobes and visualized portion of the right middle lobe. Large areas of consolidation are seen within the posterior and lateral aspects of the right lower lobe. There are small bilateral pleural effusions with a loculated component seen on the right. This contains a surgical drain which enters via the posterolateral aspect of the lower right chest wall. Hepatobiliary: Several well-defined areas of parenchymal low attenuation are seen within the right lobe of the liver. The largest measures approximately 1.7 cm x 1.0 cm. No gallstones, gallbladder wall thickening, or biliary dilatation. Pancreas: Unremarkable. No pancreatic ductal dilatation or  surrounding inflammatory changes. Spleen: Normal in size without focal abnormality. Adrenals/Urinary Tract: Adrenal glands are unremarkable. Kidneys are normal in size, without renal calculi or hydronephrosis. A

## 2021-09-16 NOTE — H&P (View-Only) (Signed)
?PROGRESS NOTE ? ? ? ?Adam Briggs  BTD:[id number] DOB: 1930-02-20 DOA: 09/15/2021 ?PCP: [name], MD ? ? ?Brief Narrative:  ?Adam Briggs is a 88 y.o. male with medical history significant of NSCLC s/p radiation therapy, A.Fib on eliquis, HTN, cirrhosis on imaging in Aug 2022. Pt has history of recurrent R sided pleural effusion, unclear cause, is transudate and cytology negative for malignancy -now with chronic indwelling pleural catheter.  Unfortunately due to age pt not a candidate for decortication. Family noted weight gain, orthopnea dyspnea on exertion worsening over the past few weeks evaluated in the outpatient setting with worsening anemia, evaluated in the ED with positive FOBT.  GI called for consult.  Hospitalist called for admission. ? ?**Afternoon update - patient has occasional runs of completely asymptomatic afib with occasional PVC read as 'vtach' on tele monitor with reassuring EKG confirming afib with occasional PVC. Currently heart rate remains controlled and again patient remains without symptoms. No current indication for troponin or further cardiac at this juncture. Family continues to voice concern over aberrant telemetry findings despite reassurances. ? ?Assessment & Plan: ?  ?Principal Problem: ?  GI bleed ?Active Problems: ?  Acute on chronic respiratory failure with hypoxia (HCC) ?  ABLA (acute blood loss anemia) ?  Atrial fibrillation ([place]) ?  Recurrent right pleural effusion ?  Malignant neoplasm of upper lobe of left lung ([place]) ?  Other cirrhosis of liver ([place]) ?  Hyponatremia ?  Essential hypertension ?  Moderate aortic stenosis ? ?Acute symptomatic anemia secondary to ongoing GI bleed ?-Dark red blood per rectum with Hgb 8.6 down from 11 baseline (last labs 7 months ago)  ?-Hgb stabilizing at 8.6 ?-GI consulted, appreciate insight and recommendations ?-Iron panel pending, unclear if patient has acute blood loss anemia, anemia of chronic disease versus anemia of  malnutrition and iron deficiency -likely multifactorial per discussion with family at bedside ?-Continue to hold anticoagulation, chronically on Eliquis as below ?-N.p.o. until cleared by GI ?-PPI drip ongoing ?  ?Acute on chronic respiratory failure with hypoxia (HCC) ?Secondary to recurrent pleural effusion ?Continue supplemental oxygen ?-Status post Lasix x1, follow clinically -currently euvolemic ?-Continue to drain Pleurx catheter daily per previous recommendations, clear amber fluid withdrawn today ?  ?Hypervolemic hyponatremia ?-Improving with diuresis, continue to follow clinically ?  ?Other cirrhosis of liver ([place]) ?Recently diagnosed in Aug 2022 ?No evidence of portal HTN on imaging at that time though ?  ?Malignant neoplasm of upper lobe of left lung ([place]) ?Not currently undergoing chemo, completed radiation. ?Sounds like this had been mostly stable on follow up CTs ?Though Feb CT this year did show 3 new small liver lesions / hypo densities, no IVC dye used though. ?  ?Atrial fibrillation ([place]) ?Hold eliquis ?Cont BB ?  ?Moderate aortic stenosis ?Chronic, asymptomatic, continue screening per cardiology outpatient ? ?Essential hypertension ?Holding spironolactone in the setting of diuretics as above  ?Continue metoprolol for A.Fib rate control ? ?DVT prophylaxis: Early ambulation, SCDs given above no chemical prophylaxis ?Code Status: Full ?Family Communication: At bedside son and daughter ? ?Status is: Inpatient ? ?Dispo: The patient is from: Home ?             Anticipated d/c is to: To be determined ?             Anticipated d/c date is: 24 to 48 hours ?             Patient currently not medically stable for discharge ? ?  Consultants:  ?GI ? ?Procedures:  ?Endoscopy pending above ? ?Antimicrobials:  ?None indicated ? ?Subjective: ?No acute issues or events overnight ? ?Objective: ?Vitals:  ? 09/16/21 0109 09/16/21 1740 09/16/21 8144 09/16/21 0717  ?BP: 117/71 (!) 90/54 (!) 93/52   ?Pulse: (!) 109 96 82  93  ?Resp: (!) 23 (!) 21  18  ?Temp: 98 ?F (36.7 ?C) 97.7 ?F (36.5 ?C)    ?TempSrc: Oral Oral    ?SpO2: 100% 100%  100%  ?Weight: 69.7 kg     ?Height: 5\' 10"  (1.778 m)     ? ? ?Intake/Output Summary (Last 24 hours) at 09/16/2021 0719 ?Last data filed at 09/16/2021 0719 ?Gross per 24 hour  ?Intake 116.6 ml  ?Output 350 ml  ?Net -233.4 ml  ? ?Filed Weights  ? 09/16/21 0109  ?Weight: 69.7 kg  ? ? ?Examination: ? ?General:  Pleasantly resting in bed, No acute distress. ?Lungs: Right posterior Pleurx catheter clean dry intact ?Heart:  Regular rate and rhythm.  Without murmurs, rubs, or gallops. ?Abdomen:  Soft, nontender, nondistended.  Without guarding or rebound. ?Extremities: Without cyanosis, clubbing, edema, or obvious deformity. ? ?Data Reviewed: I have personally reviewed following labs and imaging studies ? ?CBC: ?Recent Labs  ?Lab 09/15/21 ?1822 09/16/21 ?0241  ?WBC 6.8 6.5  ?NEUTROABS 5.6  --   ?HGB 8.6* 8.6*  ?HCT 28.9* 27.9*  ?MCV 85.5 83.0  ?PLT 188 184  ? ?Basic Metabolic Panel: ?Recent Labs  ?Lab 09/15/21 ?1822 09/16/21 ?0241  ?NA 126* 129*  ?K 4.8 4.3  ?CL 86* 83*  ?CO2 32 39*  ?GLUCOSE 101* 87  ?BUN 34* 31*  ?CREATININE 1.10 1.10  ?CALCIUM 8.9 8.9  ?MG 1.7  --   ? ?GFR: ?Estimated Creatinine Clearance: 44 mL/min (by C-G formula based on SCr of 1.1 mg/dL). ?Liver Function Tests: ?Recent Labs  ?Lab 09/15/21 ?1822  ?AST 16  ?ALT 15  ?ALKPHOS 41  ?BILITOT 0.5  ?PROT 5.2*  ?ALBUMIN 2.8*  ? ?No results for input(s): LIPASE, AMYLASE in the last 168 hours. ?No results for input(s): AMMONIA in the last 168 hours. ?Coagulation Profile: ?Recent Labs  ?Lab 09/15/21 ?1822  ?INR 1.4*  ? ?Cardiac Enzymes: ?No results for input(s): CKTOTAL, CKMB, CKMBINDEX, TROPONINI in the last 168 hours. ?BNP (last 3 results) ?No results for input(s): PROBNP in the last 8760 hours. ?HbA1C: ?No results for input(s): HGBA1C in the last 72 hours. ?CBG: ?No results for input(s): GLUCAP in the last 168 hours. ?Lipid Profile: ?No results for  input(s): CHOL, HDL, LDLCALC, TRIG, CHOLHDL, LDLDIRECT in the last 72 hours. ?Thyroid Function Tests: ?No results for input(s): TSH, T4TOTAL, FREET4, T3FREE, THYROIDAB in the last 72 hours. ?Anemia Panel: ?No results for input(s): VITAMINB12, FOLATE, FERRITIN, TIBC, IRON, RETICCTPCT in the last 72 hours. ?Sepsis Labs: ?No results for input(s): PROCALCITON, LATICACIDVEN in the last 168 hours. ? ?No results found for this or any previous visit (from the past 240 hour(s)).  ? ? ? ? ? ?Radiology Studies: ?CT ABDOMEN PELVIS W CONTRAST ? ?Result Date: 09/15/2021 ?CLINICAL DATA:  Suspected bowel obstruction. EXAM: CT ABDOMEN AND PELVIS WITH CONTRAST TECHNIQUE: Multidetector CT imaging of the abdomen and pelvis was performed using the standard protocol following bolus administration of intravenous contrast. RADIATION DOSE REDUCTION: This exam was performed according to the departmental dose-optimization program which includes automated exposure control, adjustment of the mA and/or kV according to patient size and/or use of iterative reconstruction technique. CONTRAST:  171mL OMNIPAQUE IOHEXOL 300 MG/ML  SOLN  COMPARISON:  July 17, 2007 FINDINGS: Lower chest: Moderate to marked severity areas of linear scarring and/or atelectasis are seen within the lingular region, bilateral lower lobes and visualized portion of the right middle lobe. Large areas of consolidation are seen within the posterior and lateral aspects of the right lower lobe. There are small bilateral pleural effusions with a loculated component seen on the right. This contains a surgical drain which enters via the posterolateral aspect of the lower right chest wall. Hepatobiliary: Several well-defined areas of parenchymal low attenuation are seen within the right lobe of the liver. The largest measures approximately 1.7 cm x 1.0 cm. No gallstones, gallbladder wall thickening, or biliary dilatation. Pancreas: Unremarkable. No pancreatic ductal dilatation or  surrounding inflammatory changes. Spleen: Normal in size without focal abnormality. Adrenals/Urinary Tract: Adrenal glands are unremarkable. Kidneys are normal in size, without renal calculi or hydronephrosis. A

## 2021-09-16 NOTE — Progress Notes (Signed)
PleurX catheter drained by this RN. 225 of pink, yellow drainage returned. Per Daughter at bedside,this is the normal color of his drainage. Patient tolerated procedure very well, pain rated 0/10. Sterile cap and dressing applied.  ?

## 2021-09-16 NOTE — Progress Notes (Signed)
Patient called RN into the room with complaints of "feet burning" with SCD pump therapy. RN removed compression wraps, assessed skin and tenderness. Patient stated that he got relief with removal of wraps. Dr. Avon Gully notified of SCD removal.  ?

## 2021-09-17 ENCOUNTER — Inpatient Hospital Stay (HOSPITAL_COMMUNITY): Payer: Medicare Other | Admitting: Certified Registered Nurse Anesthetist

## 2021-09-17 ENCOUNTER — Encounter (HOSPITAL_COMMUNITY): Payer: Self-pay | Admitting: Internal Medicine

## 2021-09-17 ENCOUNTER — Encounter (HOSPITAL_COMMUNITY)
Admission: EM | Disposition: A | Discharge status: Home Health | Payer: Self-pay | Source: Home / Self Care | Attending: Internal Medicine

## 2021-09-17 DIAGNOSIS — I350 Nonrheumatic aortic (valve) stenosis: Secondary | ICD-10-CM

## 2021-09-17 DIAGNOSIS — D490 Neoplasm of unspecified behavior of digestive system: Secondary | ICD-10-CM

## 2021-09-17 DIAGNOSIS — Z85118 Personal history of other malignant neoplasm of bronchus and lung: Secondary | ICD-10-CM | POA: Diagnosis not present

## 2021-09-17 DIAGNOSIS — J9621 Acute and chronic respiratory failure with hypoxia: Secondary | ICD-10-CM | POA: Diagnosis not present

## 2021-09-17 DIAGNOSIS — I480 Paroxysmal atrial fibrillation: Secondary | ICD-10-CM | POA: Diagnosis not present

## 2021-09-17 DIAGNOSIS — Z8546 Personal history of malignant neoplasm of prostate: Secondary | ICD-10-CM | POA: Diagnosis not present

## 2021-09-17 DIAGNOSIS — D500 Iron deficiency anemia secondary to blood loss (chronic): Secondary | ICD-10-CM | POA: Diagnosis not present

## 2021-09-17 DIAGNOSIS — I4891 Unspecified atrial fibrillation: Secondary | ICD-10-CM

## 2021-09-17 DIAGNOSIS — K6289 Other specified diseases of anus and rectum: Secondary | ICD-10-CM

## 2021-09-17 DIAGNOSIS — K922 Gastrointestinal hemorrhage, unspecified: Secondary | ICD-10-CM | POA: Diagnosis not present

## 2021-09-17 DIAGNOSIS — D62 Acute posthemorrhagic anemia: Secondary | ICD-10-CM | POA: Diagnosis not present

## 2021-09-17 DIAGNOSIS — K625 Hemorrhage of anus and rectum: Secondary | ICD-10-CM

## 2021-09-17 DIAGNOSIS — I10 Essential (primary) hypertension: Secondary | ICD-10-CM

## 2021-09-17 HISTORY — PX: BIOPSY: SHX5522

## 2021-09-17 HISTORY — PX: FLEXIBLE SIGMOIDOSCOPY: SHX5431

## 2021-09-17 LAB — BASIC METABOLIC PANEL
Anion gap: 8 (ref 5–15)
BUN: 29 mg/dL — ABNORMAL HIGH (ref 8–23)
CO2: 36 mmol/L — ABNORMAL HIGH (ref 22–32)
Calcium: 8.5 mg/dL — ABNORMAL LOW (ref 8.9–10.3)
Chloride: 82 mmol/L — ABNORMAL LOW (ref 98–111)
Creatinine, Ser: 1.06 mg/dL (ref 0.61–1.24)
GFR, Estimated: >60 mL/min (ref >60)
Glucose, Bld: 86 mg/dL (ref 70–99)
Potassium: 5 mmol/L (ref 3.5–5.1)
Sodium: 126 mmol/L — ABNORMAL LOW (ref 135–145)

## 2021-09-17 LAB — CBC
HCT: 29.7 % — ABNORMAL LOW (ref 39.0–52.0)
Hemoglobin: 9.2 g/dL — ABNORMAL LOW (ref 13.0–17.0)
MCH: 26.1 pg (ref 26.0–34.0)
MCHC: 31 g/dL (ref 30.0–36.0)
MCV: 84.1 fL (ref 80.0–100.0)
Platelets: 194 10*3/uL (ref 150–400)
RBC: 3.53 MIL/uL — ABNORMAL LOW (ref 4.22–5.81)
RDW: 14.9 % (ref 11.5–15.5)
WBC: 6.4 10*3/uL (ref 4.0–10.5)
nRBC: 0 % (ref 0.0–0.2)

## 2021-09-17 SURGERY — SIGMOIDOSCOPY, FLEXIBLE
Anesthesia: Monitor Anesthesia Care

## 2021-09-17 MED ORDER — SODIUM CHLORIDE 0.9 % IV SOLN
250.0000 mg | Freq: Every day | INTRAVENOUS | Status: AC
  Administered 2021-09-17 – 2021-09-18 (×2): 250 mg via INTRAVENOUS
  Filled 2021-09-17 (×2): qty 20

## 2021-09-17 MED ORDER — LACTATED RINGERS IV SOLN: INTRAVENOUS | Status: DC | PRN

## 2021-09-17 NOTE — Anesthesia Procedure Notes (Signed)
Procedure Name: Arthur ?Date/Time: 09/17/2021 9:14 AM ?Performed by: Carolan Clines, CRNA ?Pre-anesthesia Checklist: Patient identified, Emergency Drugs available, Suction available and Patient being monitored ?Patient Re-evaluated:Patient Re-evaluated prior to induction ?Oxygen Delivery Method: Simple face mask ?Dental Injury: Teeth and Oropharynx as per pre-operative assessment  ? ? ? ? ?

## 2021-09-17 NOTE — Progress Notes (Signed)
OT Cancellation Note ? ?Patient Details ?Name: Adam Briggs ?MRN: [id number] ?DOB: 1929/05/23 ? ? ?Cancelled Treatment:    Reason Eval/Treat Not Completed: Patient at procedure or test/ unavailable Patient off floor for sigmoidoscopy procedure. OT will follow back for evaluation as time permits.  ? ?Corinne Ports E. Railee Bonillas, OTR/L ?Acute Rehabilitation Services ?[phone] ?[phone]  ? ?Corinne Ports Tenzin Edelman ?09/17/2021, 9:44 AM ?

## 2021-09-17 NOTE — Transfer of Care (Signed)
Immediate Anesthesia Transfer of Care Note ? ?Patient: Adam Briggs ? ?Procedure(s) Performed: FLEXIBLE SIGMOIDOSCOPY ?BIOPSY ? ?Patient Location: Endoscopy Unit ? ?Anesthesia Type:no sedation received ? ?Level of Consciousness: awake, alert  and oriented ? ?Airway & Oxygen Therapy: Patient Spontanous Breathing and Patient connected to nasal cannula oxygen ? ?Post-op Assessment: Report given to RN and Post -op Vital signs reviewed and stable ? ?Post vital signs: Reviewed and stable ? ?Last Vitals: see last procedure VS ?Vitals Value Taken Time  ?BP    ?Temp    ?Pulse    ?Resp    ?SpO2    ? ? ?Last Pain:  ?Vitals:  ? 09/17/21 0800  ?TempSrc: Temporal  ?PainSc: 0-No pain  ?   ? ?  ? ?Complications: No notable events documented. ?

## 2021-09-17 NOTE — Evaluation (Signed)
Physical Therapy Evaluation ?Patient Details ?Name: Adam Briggs ?MRN: [id number] ?DOB: 10-Sep-1929 ?Today's Date: 09/17/2021 ? ?History of Present Illness ? Patient is a 88 yo male presenting to the ED on 09/15/21  with increased SOB and edema in bilateral legs. Admitted with GI bleed with HG at 8.6 on the same day. Flex sig revealed rectal mass.  PMH history includes: NSCLC s/p radiation therapy, A.Fib on eliquis, HTN, cirrhosis, Plurex catheter placed 3/23  ?Clinical Impression ? Pt presents to PT with decreased mobility due to decreased strength, decreased balance, and decreased functional activity tolerance. Pt has good support at home and recommend  HHPT at DC.    ?   ? ?Recommendations for follow up therapy are one component of a multi-disciplinary discharge planning process, led by the attending physician.  Recommendations may be updated based on patient status, additional functional criteria and insurance authorization. ? ?Follow Up Recommendations Home health PT ? ?  ?Assistance Recommended at Discharge Intermittent Supervision/Assistance  ?Patient can return home with the following ? A little help with walking and/or transfers;Help with stairs or ramp for entrance ? ?  ?Equipment Recommendations None recommended by PT  ?Recommendations for Other Services ?    ?  ?Functional Status Assessment Patient has had a recent decline in their functional status and demonstrates the ability to make significant improvements in function in a reasonable and predictable amount of time.  ? ?  ?Precautions / Restrictions Precautions ?Precautions: Fall ?Restrictions ?Weight Bearing Restrictions: No  ? ?  ? ?Mobility ? Bed Mobility ?  ?  ?  ?  ?  ?  ?  ?General bed mobility comments: Pt up on EOB with OT ?  ? ?Transfers ?Overall transfer level: Needs assistance ?Equipment used: Rollator (4 wheels) ?Transfers: Sit to/from Stand ?Sit to Stand: Min guard ?  ?  ?  ?  ?  ?General transfer comment: Assist  for safety and verbal cues  for hand placement ?  ? ?Ambulation/Gait ?Ambulation/Gait assistance: Min guard, Supervision ?Gait Distance (Feet): 220 Feet ?Assistive device: Rollator (4 wheels) ?Gait Pattern/deviations: Step-through pattern, Decreased stride length, Trunk flexed ?Gait velocity: decr ?Gait velocity interpretation: 1.31 - 2.62 ft/sec, indicative of limited community ambulator ?  ?General Gait Details: Assist for safety and lines ? ?Stairs ?  ?  ?  ?  ?  ? ?Wheelchair Mobility ?  ? ?Modified Rankin (Stroke Patients Only) ?  ? ?  ? ?Balance Overall balance assessment: Needs assistance ?Sitting-balance support: No upper extremity supported, Feet supported ?Sitting balance-Leahy Scale: Good ?  ?  ?Standing balance support: No upper extremity supported, During functional activity ?Standing balance-Leahy Scale: Fair ?  ?  ?  ?  ?  ?  ?  ?  ?  ?  ?  ?  ?   ? ? ? ?Pertinent Vitals/Pain Pain Assessment ?Pain Assessment: No/denies pain  ? ? ?Home Living Family/patient expects to be discharged to:: Private residence ?Living Arrangements: Alone ?Available Help at Discharge: Family;Available 24 hours/day (Daughter is two doors down but primarily lives at the house) ?Type of Home: House ?Home Access: Stairs to enter ?Entrance Stairs-Rails: Can reach both ?Entrance Stairs-Number of Steps: 1 ?  ?Home Layout: One level ?Home Equipment: Rollator (4 wheels);Cane - single point;Grab bars - toilet;Grab bars - tub/shower;Wheelchair - manual;BSC/3in1 (daughter has rollator he can use) ?   ?  ?Prior Function Prior Level of Function : Needs assist ?  ?  ?  ?  ?  ?  ?Mobility  Comments: patient usually ambulating with cane, has w/c for community if needed but hasn't needed much. ?ADLs Comments: can complete with extra time, daughter completes ADLs ?  ? ? ?Hand Dominance  ?   ? ?  ?Extremity/Trunk Assessment  ? Upper Extremity Assessment ?Upper Extremity Assessment: Defer to OT evaluation ?  ? ?Lower Extremity Assessment ?Lower Extremity Assessment:  Generalized weakness ?  ? ?Cervical / Trunk Assessment ?Cervical / Trunk Assessment: Kyphotic  ?Communication  ? Communication: HOH  ?Cognition Arousal/Alertness: Awake/alert ?Behavior During Therapy: [facility] for tasks assessed/performed ?Overall Cognitive Status: Within Functional Limits for tasks assessed ?  ?  ?  ?  ?  ?  ?  ?  ?  ?  ?  ?  ?  ?  ?  ?  ?  ?  ?  ? ?  ?General Comments General comments (skin integrity, edema, etc.): Amb on 2L with SpO2 86% at end of gait. Returned to >90% after 30 sec sitting rest ? ?  ?Exercises    ? ?Assessment/Plan  ?  ?PT Assessment Patient needs continued PT services  ?PT Problem List Decreased strength;Decreased activity tolerance;Decreased balance;Decreased mobility;Cardiopulmonary status limiting activity ? ?   ?  ?PT Treatment Interventions DME instruction;Gait training;Functional mobility training;Therapeutic activities;Therapeutic exercise;Balance training;Patient/family education   ? ?PT Goals (Current goals can be found in the Care Plan section)  ?Acute Rehab PT Goals ?Patient Stated Goal: return home ?PT Goal Formulation: With patient ?Time For Goal Achievement: 10/01/21 ?Potential to Achieve Goals: Good ? ?  ?Frequency Min 3X/week ?  ? ? ?Co-evaluation   ?  ?  ?  ?  ? ? ?  ?AM-PAC PT "6 Clicks" Mobility  ?Outcome Measure Help needed turning from your back to your side while in a flat bed without using bedrails?: None ?Help needed moving from lying on your back to sitting on the side of a flat bed without using bedrails?: A Little ?Help needed moving to and from a bed to a chair (including a wheelchair)?: A Little ?Help needed standing up from a chair using your arms (e.g., wheelchair or bedside chair)?: A Little ?Help needed to walk in hospital room?: A Little ?Help needed climbing 3-5 steps with a railing? : A Little ?6 Click Score: 19 ? ?  ?End of Session Equipment Utilized During Treatment: Oxygen ?Activity Tolerance: Patient tolerated treatment well ?Patient left: in  chair;with call bell/phone within reach;with family/visitor present ?  ?PT Visit Diagnosis: Other abnormalities of gait and mobility (R26.89);Muscle weakness (generalized) (M62.81) ?  ? ?Time: 7062-3762 ?PT Time Calculation (min) (ACUTE ONLY): 15 min ? ? ?Charges:   PT Evaluation ?$PT Eval Moderate Complexity: 1 Mod ?  ?  ?   ? ? ?[facility] PT ?Acute Rehabilitation Services ?Office [phone] ? ? ?Shary Decamp [facility] ?09/17/2021, 3:57 PM ? ?

## 2021-09-17 NOTE — Anesthesia Postprocedure Evaluation (Signed)
Anesthesia Post Note ? ?Patient: FLEETWOOD PIERRON ? ?Procedure(s) Performed: FLEXIBLE SIGMOIDOSCOPY ?BIOPSY ? ?  ? ?Patient location during evaluation: PACU ?Anesthesia Type: MAC ?Level of consciousness: awake and alert ?Pain management: pain level controlled ?Vital Signs Assessment: post-procedure vital signs reviewed and stable ?Respiratory status: spontaneous breathing, nonlabored ventilation, respiratory function stable and patient connected to nasal cannula oxygen ?Cardiovascular status: stable and blood pressure returned to baseline ?Postop Assessment: no apparent nausea or vomiting ?Anesthetic complications: no ? ? ?No notable events documented. ? ?Last Vitals:  ?Vitals:  ? 09/17/21 0747 09/17/21 0800  ?BP: 126/79 (!) 117/59  ?Pulse: 93 99  ?Resp: 19 14  ?Temp: 36.7 ?C 36.6 ?C  ?SpO2: 97% 98%  ?  ?Last Pain:  ?Vitals:  ? 09/17/21 0800  ?TempSrc: Temporal  ?PainSc: 0-No pain  ? ? ?  ?  ?  ?  ?  ?  ? ?Jozey Janco S ? ? ? ? ?

## 2021-09-17 NOTE — Interval H&P Note (Signed)
History and Physical Interval Note: ?Patient states he tolerated enemas well without much blood. Hgb stable this AM. Has not had Eliquis in a few days. I have discussed flex sig with him - risks / benefits. I think we can get him through this awake and avoid anesthesia to minimize his cardiovascular risk with the exam. He is agreeable to this if he can tolerate it. Discussed ddx. Radiation proctitis, hemorrhoidal bleeding, polyp / mass lesion, etc, will see what we find. If he has radiation proctitis can treat with APC. He agrees to proceed, further recommendations pending the results. ? ?09/17/2021 ?9:12 AM ? ?Adam Briggs  has presented today for surgery, with the diagnosis of Rectal bleeding.  The various methods of treatment have been discussed with the patient and family. After consideration of risks, benefits and other options for treatment, the patient has consented to  Procedure(s): ?FLEXIBLE SIGMOIDOSCOPY (N/A) as a surgical intervention.  The patient's history has been reviewed, patient examined, no change in status, stable for surgery.  I have reviewed the patient's chart and labs.  Questions were answered to the patient's satisfaction.   ? ? ?Adam Briggs ? ? ?

## 2021-09-17 NOTE — Op Note (Signed)
[facility] ?Patient Name: Adam Briggs ?Procedure Date : 09/17/2021 ?MRN: [id number] ?Attending MD: Carlota Raspberry. Havery Moros , MD ?Date of Birth: [birth year] ?CSN: [id number] ?Age: 88 ?Admit Type: Inpatient ?Procedure:                Flexible Sigmoidoscopy ?Indications:              Rectal bleeding - anemia. History of prostate /  ?                          lung cancer, on Eliquis. Last colonoscopy 2013 with  ?                          adenomas removed at that time. Chronic constipation ?Providers:                Carlota Raspberry. Havery Moros, MD, Ervin Knack, RN,  ?                          William Dalton, Technician ?Referring MD:              ?Medicines:                None ?Complications:            No immediate complications. Estimated blood loss:  ?                          Minimal. ?Estimated Blood Loss:     Estimated blood loss was minimal. ?Procedure:                Pre-Anesthesia Assessment: ?                          - Prior to the procedure, a History and Physical  ?                          was performed, and patient medications and  ?                          allergies were reviewed. The patient's tolerance of  ?                          previous anesthesia was also reviewed. The risks  ?                          and benefits of the procedure and the sedation  ?                          options and risks were discussed with the patient.  ?                          All questions were answered, and informed consent  ?                          was obtained. Prior Anticoagulants: The patient has  ?  taken Eliquis (apixaban), last dose was 3 days  ?                          prior to procedure. ASA Grade Assessment: III - A  ?                          patient with severe systemic disease. After  ?                          reviewing the risks and benefits, the patient was  ?                          deemed in satisfactory condition to undergo the  ?                           procedure. ?                          After obtaining informed consent, the scope was  ?                          passed under direct vision. The [id number]-[id number] ([id number])  ?                          Olympus endoscope was introduced through the anus  ?                          and advanced to the the sigmoid colon. The flexible  ?                          sigmoidoscopy was accomplished without difficulty.  ?                          The patient tolerated the procedure well. The  ?                          quality of the bowel preparation was adequate. ?Scope In: 9:19:32 AM ?Scope Out: 9:28:26 AM ?Total Procedure Duration: 0 hours 8 minutes 54 seconds  ?Findings: ?     The digital rectal exam was abnormal with abnormal tissue palpated  ?     within the anal canal and distal rectum. ?     The distal sigmoid colon appeared normal although prep was poor proximal  ?     to the rectum. ?     An ulcerated mass lesion was found in the distal rectum involving the  ?     dentate line and anal canal, extending up proximally a few centimeters.  ?     Biopsies were taken with a cold forceps for histology. The mass was  ?     ulcerated / friable, and the cause of the patient's bleeding. No tattoo  ?     placed given arising from the anal canal. ?     Internal hemorrhoids were found during retroflexion. ?     The exam was otherwise without abnormality of what was visualized -  ?  limited exam with rectum well seen, distal sigmoid with poor prep. ?Impression:               - Abnormal digital rectal exam. ?                          - The distal sigmoid colon is normal. ?                          - Mass in the distal rectum / anal canal as  ?                          described. Biopsied. This is the cause of the  ?                          patient's symptoms. ?Recommendation:           - Return patient to hospital ward for ongoing care. ?                          - Continue present medications. ?                          - Okay to resume  diet ?                          - Continue Miralax to keep stools loose ?                          - Await pathology results. ?                          - I have discussed the results with the patient,  ?                          will await pathology. I will also discuss with the  ?                          patient's family and primary service. Difficult  ?                          situation. Bleeding will not be able to be  ?                          controlled endoscopically and he is a poor surgical  ?                          candidate. Will await path, consider pelvic MRI,  ?                          recommend radiation oncology / oncology  ?                          consultation. Of note he has previously had  ?  radiation to his pelvis from prostate cancer. ?                          - Call with questions ?Procedure Code(s):        --- Professional --- ?                          [phone], Sigmoidoscopy, flexible; with biopsy, single  ?                          or multiple ?Diagnosis Code(s):        --- Professional --- ?                          K62.89, Other specified diseases of anus and rectum ?                          D49.0, Neoplasm of unspecified behavior of  ?                          digestive system ?                          K92.1, Melena (includes Hematochezia) ?CPT copyright 2019 American Medical Association. All rights reserved. ?The codes documented in this report are preliminary and upon coder review may  ?be revised to meet current compliance requirements. ?Carlota Raspberry. Wilhelmena Zea, MD ?09/17/2021 9:44:03 AM ?This report has been signed electronically. ?Number of Addenda: 0 ?

## 2021-09-17 NOTE — Progress Notes (Signed)
?PROGRESS NOTE ? ? ? ?Adam Briggs  WER:[id number] DOB: 1930-02-03 DOA: 09/15/2021 ?PCP: [name], MD ? ? ?Brief Narrative:  ?Adam Briggs is a 88 y.o. male with medical history significant of NSCLC s/p radiation therapy, A.Fib on eliquis, HTN, cirrhosis on imaging in Aug 2022. Pt has history of recurrent R sided pleural effusion, unclear cause, is transudate and cytology negative for malignancy -now with chronic indwelling pleural catheter.  Unfortunately due to age pt not a candidate for decortication. Family noted weight gain, orthopnea dyspnea on exertion worsening over the past few weeks evaluated in the outpatient setting with worsening anemia, evaluated in the ED with positive FOBT.  GI called for consult.  Hospitalist called for admission. ? ?Assessment & Plan: ?  ?Principal Problem: ?  GI bleed ?Active Problems: ?  Acute on chronic respiratory failure with hypoxia (HCC) ?  ABLA (acute blood loss anemia) ?  Atrial fibrillation ([place]) ?  Recurrent right pleural effusion ?  Malignant neoplasm of upper lobe of left lung ([place]) ?  Other cirrhosis of liver ([place]) ?  Hyponatremia ?  Essential hypertension ?  Moderate aortic stenosis ? ?Acute symptomatic anemia secondary to ongoing GI bleed ?Rule out rectal mass versus neoplastic process ?-Dark red blood per rectum with Hgb 8.6 down from 11 baseline (last labs 7 months ago)  ?-Hgb improving with supportive care ?-GI consulted, appreciate insight and recommendations ?-Preliminary flex sig report concerning for rectal mass, biopsy pending, will likely need further evaluation and discussion with oncology.  Given patient's age and chronic comorbid conditions unclear if patient will tolerate treatment at this time.  We will also likely need staging in the outpatient setting if family and patient want to move forward with possible treatment. (Last PET scan May 2022) ?  ?Acute on chronic respiratory failure with hypoxia (HCC) ?Secondary to recurrent pleural  effusion ?Aortic stenosis, moderate ?Continue supplemental oxygen wean as tolerated ?Continue to drain Pleurx catheter daily per previous recommendations, defer to radiation oncology for further work-up over lung mass ?  ?Hypervolemic hyponatremia ?-Improving with diuresis, continue to follow clinically ?  ?Other cirrhosis of liver ([place]) ?Recently diagnosed in Aug 2022 ?No evidence of portal HTN on imaging at that time though ?  ?Malignant neoplasm of upper lobe of left lung ([place]) ?Not currently undergoing chemo, completed radiation. ?Given questionable new liver lesions and new rectal mass oncology consulted as above ?  ?Atrial fibrillation ([place]) ?Hold eliquis ?Cont BB ?  ?Moderate aortic stenosis ?Chronic, asymptomatic, continue screening per cardiology outpatient ? ?Essential hypertension ?Holding spironolactone in the setting of diuretics as above  ?Continue metoprolol for A.Fib rate control ? ?DVT prophylaxis: Early ambulation, SCDs given above no chemical prophylaxis ?Code Status: Full ?Family Communication: At bedside son and daughter ? ?Status is: Inpatient ? ?Dispo: The patient is from: Home ?             Anticipated d/c is to: To be determined ?             Anticipated d/c date is: 24 to 48 hours ?             Patient currently not medically stable for discharge ? ?Consultants:  ?GI ? ?Procedures:  ?Flex sigmoid colonoscopy ? ?Antimicrobials:  ?None indicated ? ?Subjective: ?No acute issues or events overnight ? ?Objective: ?Vitals:  ? 09/16/21 1800 09/16/21 2056 09/17/21 0001 09/17/21 0447  ?BP:  99/60 (!) 96/58 113/80  ?Pulse:  80 74 99  ?Resp:  20 20  20  ?Temp:  98.6 ?F (37 ?C) 97.9 ?F (36.6 ?C) 97.6 ?F (36.4 ?C)  ?TempSrc:  Oral Oral Oral  ?SpO2: 100% 100% 100% 98%  ?Weight:   70.6 kg   ?Height:      ? ? ?Intake/Output Summary (Last 24 hours) at 09/17/2021 0711 ?Last data filed at 09/17/2021 0448 ?Gross per 24 hour  ?Intake 506.39 ml  ?Output 1000 ml  ?Net -493.61 ml  ? ? ?Filed Weights  ? 09/16/21 0109  09/17/21 0001  ?Weight: 69.7 kg 70.6 kg  ? ? ?Examination: ? ?General:  Pleasantly resting in bed, No acute distress. ?Lungs: Right posterior Pleurx catheter clean dry intact ?Heart:  Regular rate and rhythm.  Without murmurs, rubs, or gallops. ?Abdomen:  Soft, nontender, nondistended.  Without guarding or rebound. ?Extremities: Without cyanosis, clubbing, edema, or obvious deformity. ? ?Data Reviewed: I have personally reviewed following labs and imaging studies ? ?CBC: ?Recent Labs  ?Lab 09/15/21 ?1822 09/16/21 ?0241 09/17/21 ?0148  ?WBC 6.8 6.5 6.4  ?NEUTROABS 5.6  --   --   ?HGB 8.6* 8.6* 9.2*  ?HCT 28.9* 27.9* 29.7*  ?MCV 85.5 83.0 84.1  ?PLT 188 184 194  ? ? ?Basic Metabolic Panel: ?Recent Labs  ?Lab 09/15/21 ?1822 09/16/21 ?0241 09/17/21 ?0148  ?NA 126* 129* 126*  ?K 4.8 4.3 5.0  ?CL 86* 83* 82*  ?CO2 32 39* 36*  ?GLUCOSE 101* 87 86  ?BUN 34* 31* 29*  ?CREATININE 1.10 1.10 1.06  ?CALCIUM 8.9 8.9 8.5*  ?MG 1.7  --   --   ? ? ?GFR: ?Estimated Creatinine Clearance: 46.3 mL/min (by C-G formula based on SCr of 1.06 mg/dL). ?Liver Function Tests: ?Recent Labs  ?Lab 09/15/21 ?1822  ?AST 16  ?ALT 15  ?ALKPHOS 41  ?BILITOT 0.5  ?PROT 5.2*  ?ALBUMIN 2.8*  ? ? ?No results for input(s): LIPASE, AMYLASE in the last 168 hours. ?No results for input(s): AMMONIA in the last 168 hours. ?Coagulation Profile: ?Recent Labs  ?Lab 09/15/21 ?1822  ?INR 1.4*  ? ? ?Cardiac Enzymes: ?No results for input(s): CKTOTAL, CKMB, CKMBINDEX, TROPONINI in the last 168 hours. ?BNP (last 3 results) ?No results for input(s): PROBNP in the last 8760 hours. ?HbA1C: ?No results for input(s): HGBA1C in the last 72 hours. ?CBG: ?No results for input(s): GLUCAP in the last 168 hours. ?Lipid Profile: ?No results for input(s): CHOL, HDL, LDLCALC, TRIG, CHOLHDL, LDLDIRECT in the last 72 hours. ?Thyroid Function Tests: ?No results for input(s): TSH, T4TOTAL, FREET4, T3FREE, THYROIDAB in the last 72 hours. ?Anemia Panel: ?Recent Labs  ?  09/16/21 ?1050   ?TIBC 414  ?IRON 23*  ? ?Sepsis Labs: ?No results for input(s): PROCALCITON, LATICACIDVEN in the last 168 hours. ? ?No results found for this or any previous visit (from the past 240 hour(s)).  ? ? ? ? ? ?Radiology Studies: ?CT ABDOMEN PELVIS W CONTRAST ? ?Result Date: 09/15/2021 ?CLINICAL DATA:  Suspected bowel obstruction. EXAM: CT ABDOMEN AND PELVIS WITH CONTRAST TECHNIQUE: Multidetector CT imaging of the abdomen and pelvis was performed using the standard protocol following bolus administration of intravenous contrast. RADIATION DOSE REDUCTION: This exam was performed according to the departmental dose-optimization program which includes automated exposure control, adjustment of the mA and/or kV according to patient size and/or use of iterative reconstruction technique. CONTRAST:  146mL OMNIPAQUE IOHEXOL 300 MG/ML  SOLN COMPARISON:  July 17, 2007 FINDINGS: Lower chest: Moderate to marked severity areas of linear scarring and/or atelectasis are seen within the lingular region, bilateral  lower lobes and visualized portion of the right middle lobe. Large areas of consolidation are seen within the posterior and lateral aspects of the right lower lobe. There are small bilateral pleural effusions with a loculated component seen on the right. This contains a surgical drain which enters via the posterolateral aspect of the lower right chest wall. Hepatobiliary: Several well-defined areas of parenchymal low attenuation are seen within the right lobe of the liver. The largest measures approximately 1.7 cm x 1.0 cm. No gallstones, gallbladder wall thickening, or biliary dilatation. Pancreas: Unremarkable. No pancreatic ductal dilatation or surrounding inflammatory changes. Spleen: Normal in size without focal abnormality. Adrenals/Urinary Tract: Adrenal glands are unremarkable. Kidneys are normal in size, without renal calculi or hydronephrosis. A 1.2 cm simple cyst is seen within the posterior aspect of the mid right  kidney. A similar appearing 3.2 cm simple cyst is seen within the lower pole of the left kidney. No additional follow-up or imaging is recommended. Bladder is unremarkable. Stomach/Bowel: Stomach is within norm

## 2021-09-17 NOTE — Consult Note (Addendum)
[place]  ?[phone] [phone]  ? ?MEDICAL ONCOLOGY - INITIAL CONSULTATION ? ?Referral MD: Dr. Holli Humbles ? ?Reason for Referral: Rectal mass ? ?HPI: Adam Briggs is a 88 year old male with a past medical history significant for A-fib on Eliquis, hypertension, cirrhosis noted on imaging from August 2022, history of stage Ia, cT1, cN0, M0 suspected non-small cell lung cancer of the left upper lobe status post SBRT completed on 11/21/2020 (cytology showed atypical cells), history of right pleural effusion (cytology negative) status post Pleurx catheter placement in March 2023, history of prostate cancer.  He presented to the emergency department with shortness of breath and edema.  On admission, his hemoglobin was 8.6, sodium 126, BUN 34, total protein 5.2, albumin 2.8, BNP 345.1.  Stool for occult blood was positive.  Iron was 23 and percent saturation was 6%.  Ferritin was not checked.  He had a CT of the abdomen and pelvis performed which showed moderate to marked severe post lingular, bilateral lower lobe, and right middle lobe linear scarring and/or atelectasis, large areas of consolidation within the posterior and lateral aspects of the right lower lobe which may represent pneumonia, small bilateral pleural effusions with a loculated component and associated pleural drainage catheter on the right, findings which may represent small hepatic cysts versus hemangiomas, a large noninflamed diverticulum along the junction of the distal descending and proximal sigmoid colon, prostate radiation implant seeds.  He underwent flex sigmoidoscopy earlier today which showed a mass in the distal rectum/anal canal which was biopsied.  Biopsy is pending. ? ?The patient was seen in his hospital room.  His daughter is at the bedside.  The patient reports that he is feeling somewhat better.  He still has generalized weakness and is short of breath with exertion.  He was able to walk with  physical therapy earlier today but did have some drop in his oxygen saturations.  The patient has been experiencing some decrease in his appetite but has not lost weight which he thinks is due to the edema that he developed.  He is not having any headaches or dizziness.  He currently denies chest pain or shortness of breath at rest.  He denies abdominal pain.  He has had some nausea and dry heaving which have now resolved.  His daughter states that he has had some hematochezia which she thought was due to hemorrhoids.  The patient had his last colonoscopy in 2013 and there was a single polyp removed.  No additional screening colonoscopy was recommended due to age.  The patient is widowed.  He has 2 children-a son and a daughter.  His daughter lives very close to him and checks on him routinely.  He denies alcohol use.  He has remote history of tobacco use.  No family history of malignancy. Medical oncology was asked see the patient make recommendations regarding his colon mass. ? ?Past Medical History:  ?Diagnosis Date  ? Atrial fibrillation ([place])   ? BENIGN PROSTATIC HYPERTROPHY   ? Cataract   ? DIVERTICULOSIS, COLON   ? DM   ? not diabetic-has been off metformin for about 2 years as of 09/2020 pr daughter  ? GERD   ? Heart murmur   ? Hernia   ? HYPERCHOLESTEROLEMIA   ? HYPERTENSION   ? Lung cancer ([place])   ? Moderate aortic stenosis 10/30/2011  ? echo 2013  ? Prostate cancer [facility])   ? s/p seed implant  ? Recurrent right pleural effusion   ?: ? ? ?  Past Surgical History:  ?Procedure Laterality Date  ? BRONCHIAL BIOPSY  10/17/2020  ? Procedure: BRONCHIAL BIOPSIES;  Surgeon: Garner Nash, DO;  Location: [place] [facility];  Service: Pulmonary;;  ? BRONCHIAL BRUSHINGS  10/17/2020  ? Procedure: BRONCHIAL BRUSHINGS;  Surgeon: Garner Nash, DO;  Location: [place];  Service: Pulmonary;;  ? BRONCHIAL NEEDLE ASPIRATION BIOPSY  10/17/2020  ? Procedure: BRONCHIAL NEEDLE ASPIRATION BIOPSIES;  Surgeon: Garner Nash, DO;   Location: [place];  Service: Pulmonary;;  ? BRONCHIAL WASHINGS  10/17/2020  ? Procedure: BRONCHIAL WASHINGS;  Surgeon: Garner Nash, DO;  Location: [place] [facility];  Service: Pulmonary;;  ? CATARACT EXTRACTION W/ INTRAOCULAR LENS IMPLANT  02/2013  ? R, then L 4 weeks later - [place]  ? CHEST TUBE INSERTION N/A 08/08/2021  ? Procedure: INSERTION PLEURAL DRAINAGE CATHETER;  Surgeon: Garner Nash, DO;  Location: [place] [facility];  Service: Pulmonary;  Laterality: N/A;  ? FIDUCIAL MARKER PLACEMENT  10/17/2020  ? Procedure: FIDUCIAL MARKER PLACEMENT;  Surgeon: Garner Nash, DO;  Location: [place] [facility];  Service: Pulmonary;;  ? INGUINAL HERNIA REPAIR    ? Right  ? INGUINAL HERNIA REPAIR Left 07/15/2014  ? Procedure: LEFT INGUINAL HERNIA REPAIR WITH MESH;  Surgeon: Armandina Gemma, MD;  Location: [facility];  Service: General;  Laterality: Left;  ? INSERTION OF MESH Left 07/15/2014  ? Procedure: INSERTION OF MESH;  Surgeon: Armandina Gemma, MD;  Location: [facility];  Service: General;  Laterality: Left;  ? RADIOACTIVE SEED IMPLANT    ? THORACENTESIS N/A 01/05/2021  ? Procedure: THORACENTESIS;  Surgeon: Candee Furbish, MD;  Location: [facility];  Service: Pulmonary;  Laterality: N/A;  ? VIDEO BRONCHOSCOPY WITH ENDOBRONCHIAL NAVIGATION Left 10/17/2020  ? Procedure: VIDEO BRONCHOSCOPY WITH ENDOBRONCHIAL NAVIGATION;  Surgeon: Garner Nash, DO;  Location: [place];  Service: Pulmonary;  Laterality: Left;  ? VIDEO BRONCHOSCOPY WITH ENDOBRONCHIAL ULTRASOUND Bilateral 10/17/2020  ? Procedure: VIDEO BRONCHOSCOPY WITH ENDOBRONCHIAL ULTRASOUND;  Surgeon: Garner Nash, DO;  Location: [place];  Service: Pulmonary;  Laterality: Bilateral;  ?: ? ? ?Current Facility-Administered Medications  ?Medication Dose Route Frequency Provider Last Rate Last Admin  ? acetaminophen (TYLENOL) tablet 650 mg  650 mg Oral Q6H PRN Etta Quill, DO      ? Or  ? acetaminophen (TYLENOL) suppository 650 mg  650 mg Rectal Q6H PRN Etta Quill,  DO      ? calcium-vitamin D (OSCAL WITH D) 500-5 MG-MCG per tablet 0.5 tablet  0.5 tablet Oral Q breakfast Jennette Kettle M, DO   0.5 tablet at 09/17/21 0802  ? fluticasone (FLONASE) 50 MCG/ACT nasal spray 2 spray  2 spray Each Nare Daily PRN Etta Quill, DO      ? levalbuterol Penne Lash) nebulizer solution 0.63 mg  0.63 mg Inhalation Q4H PRN Etta Quill, DO      ? melatonin tablet 10 mg  10 mg Oral QHS PRN Kristopher Oppenheim, DO   10 mg at 09/16/21 0151  ? metoprolol tartrate (LOPRESSOR) tablet 25 mg  25 mg Oral BID Etta Quill, DO   25 mg at 09/17/21 5732  ? ondansetron (ZOFRAN) tablet 4 mg  4 mg Oral Q6H PRN Etta Quill, DO      ? Or  ? ondansetron (ZOFRAN) injection 4 mg  4 mg Intravenous Q6H PRN Etta Quill, DO      ? [START ON 09/19/2021] pantoprazole (PROTONIX) injection 40 mg  40 mg Intravenous Q12H Etta Quill,  DO      ? pantoprozole (PROTONIX) 80 mg /NS 100 mL infusion  8 mg/hr Intravenous Continuous Etta Quill, DO 10 mL/hr at 09/17/21 0912 8 mg/hr at 09/17/21 0912  ? polyethylene glycol (MIRALAX / GLYCOLAX) packet 17 g  17 g Oral Daily Willia Craze, NP   17 g at 09/17/21 0802  ? simvastatin (ZOCOR) tablet 20 mg  20 mg Oral q1800 Etta Quill, DO   20 mg at 09/16/21 1649  ? tamsulosin (FLOMAX) capsule 0.4 mg  0.4 mg Oral Daily Jennette Kettle M, DO   0.4 mg at 09/17/21 0802  ? ? ? ? ?Allergies  ?Allergen Reactions  ? Lisinopril Rash  ?: ? ? ?Family History  ?Problem Relation Age of Onset  ? Heart attack Paternal Uncle   ?     in his 52s  ? Atrial fibrillation Daughter   ? Colon cancer Neg Hx   ?: ? ? ?Social History  ? ?Socioeconomic History  ? Marital status: Widowed  ?  Spouse name: Not on file  ? Number of children: 2  ? Years of education: Not on file  ? Highest education level: Not on file  ?Occupational History  ? Occupation: Retired   ?  Comment: Retired  ?Tobacco Use  ? Smoking status: Former  ?  Packs/day: 0.50  ?  Years: 35.00  ?  Pack years: 17.50  ?  Types:  Cigarettes  ?  Quit date: 10/30/1966  ?  Years since quitting: 54.9  ? Smokeless tobacco: Never  ?Vaping Use  ? Vaping Use: Never used  ?Substance and Sexual Activity  ? Alcohol use: No  ? Drug use: No  ? Sexu

## 2021-09-17 NOTE — Consult Note (Signed)
? ?  [facility] CM Inpatient Consult ? ? ?09/17/2021 ? ?Richardson Chiquito Klonowski ?08-08-1929 ?[id number] ? ?[facility] [ACO] Patient: Medicare ACO Reach ? ?Primary Care Provider:  [name], MD, [facility], is an embedded provider with a Chronic Care Management team and program, and is listed for the transition of care follow up and appointments. ? ?Patient was screened for high risk score for unplanned readmission risk, and for Embedded practice service needs for chronic care management for post hospital care. There are notes in Encounters from the Embedded Pharmacist on the Embedded CCM team. ? ?Plan: Continue to follow to see if a referral request needs to be sent to the [place] Management and made aware of TOC needs for post hospital needs. ? ?Please contact for further questions, ? ?Natividad Brood, RN BSN CCM ?[facility] Liaison ? [phone] business mobile phone ?Toll free office [phone]  ?Fax number: [phone] ?[email or web address] ? ? ? ?

## 2021-09-17 NOTE — TOC Progression Note (Signed)
Transition of Care (TOC) - Progression Note  ? ? ?Patient Details  ?Name: Adam Briggs ?MRN: [id number] ?Date of Birth: [birth year] ? ?Transition of Care (TOC) CM/SW Contact  ?Zenon Mayo, RN ?Phone Number: ?09/17/2021, 8:43 AM ? ?Clinical Narrative:    ? ?Transition of Care (TOC) Screening Note ? ? ?Patient Details  ?Name: Adam Briggs ?Date of Birth: 02/19/1930 ? ? ?Transition of Care (TOC) CM/SW Contact:    ?Zenon Mayo, RN ?Phone Number: ?09/17/2021, 8:43 AM ? ? ? ?Transition of Care Department [facility]) has reviewed patient . Awaiting PT eval.   We will continue to monitor patient advancement through interdisciplinary progression rounds. If new patient transition needs arise, please place a TOC consult. ?  ? ? ?  ?  ? ?Expected Discharge Plan and Services ?  ?  ?  ?  ?  ?                ?  ?  ?  ?  ?  ?  ?  ?  ?  ?  ? ? ?Social Determinants of Health (SDOH) Interventions ?  ? ?Readmission Risk Interventions ?   ? View : No data to display.  ?  ?  ?  ? ? ?

## 2021-09-17 NOTE — Anesthesia Preprocedure Evaluation (Signed)
Anesthesia Evaluation  ?Patient identified by MRN, date of birth, ID band ?Patient awake ? ? ? ?Reviewed: ?Allergy & Precautions, NPO status , Patient's Chart, lab work & pertinent test results ? ?Airway ?Mallampati: II ? ?TM Distance: >3 FB ?Neck ROM: Full ? ? ? Dental ?no notable dental hx. ? ?  ?Pulmonary ?neg pulmonary ROS, former smoker,  ?  ?Pulmonary exam normal ?breath sounds clear to auscultation ? ? ? ? ? ? Cardiovascular ?hypertension, Pt. on medications and Pt. on home beta blockers ?+ dysrhythmias Atrial Fibrillation + Valvular Problems/Murmurs AS  ?Rhythm:Irregular Rate:Normal ? ? ?  ?Neuro/Psych ?negative neurological ROS ? negative psych ROS  ? GI/Hepatic ?Neg liver ROS, GERD  ,  ?Endo/Other  ? ? Renal/GU ?negative Renal ROS  ?negative genitourinary ?  ?Musculoskeletal ?negative musculoskeletal ROS ?(+)  ? Abdominal ?  ?Peds ?negative pediatric ROS ?(+)  Hematology ? ?(+) Blood dyscrasia, anemia ,   ?Anesthesia Other Findings ? ? Reproductive/Obstetrics ?negative OB ROS ? ?  ? ? ? ? ? ? ? ? ? ? ? ? ? ?  ?  ? ? ? ? ? ? ? ? ?Anesthesia Physical ?Anesthesia Plan ? ?ASA: 3 ? ?Anesthesia Plan: MAC  ? ?Post-op Pain Management: Minimal or no pain anticipated  ? ?Induction: Intravenous ? ?PONV Risk Score and Plan: 1 and Propofol infusion and Treatment may vary due to age or medical condition ? ?Airway Management Planned: Simple Face Mask ? ?Additional Equipment:  ? ?Intra-op Plan:  ? ?Post-operative Plan:  ? ?Informed Consent: I have reviewed the patients History and Physical, chart, labs and discussed the procedure including the risks, benefits and alternatives for the proposed anesthesia with the patient or authorized representative who has indicated his/her understanding and acceptance.  ? ? ? ?Dental advisory given ? ?Plan Discussed with: CRNA and Surgeon ? ?Anesthesia Plan Comments:   ? ? ? ? ? ? ?Anesthesia Quick Evaluation ? ?

## 2021-09-17 NOTE — Consult Note (Signed)
?Cardiology Consultation:  ? ?Patient ID: Adam Briggs ?MRN: [id number]; DOB: [birth year] ? ?Admit date: 09/15/2021 ?Date of Consult: 09/17/2021 ? ?PCP:  [name], MD ?  ?[facility] Providers ?Cardiologist:  Minus Breeding, MD   { ? ? ?Patient Profile:  ? ?Adam Briggs is a 88 y.o. 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 Avon Gully. ? ?Underwent PleurX Tunneled Pleural Catheter Placement 08/08/21 due to recurrent right-sided pleural effusion.  Cytology negative for malignancy on prior thoracentesis.  Patient did not felt candidate for decortication due to HP ? ?History of Present Illness:  ? ?Adam Briggs reported worsening shortness of breath and lower extremity edema.  Increased outpatient diuretics without improvement.  Brought to ER by EMS for hypoxia.  Reported blood in his stool.  Hemoglobin 8.6 (down from 11.7 previously).  Stool guaiac was positive. Patient had intermittent bright red blood in his stool for past 3-4 weeks which he thought due to chronic hemorrhoid issue.  ? ?The patient was admitted and given IV Lasix 40 mg x 2 on admission.  Eliquis was held.  Placed on PPI drip.  Underwent flexible sigmoidoscopy this morning for rectal bleeding/anemia>>> concerning for rectal mass>> pending oncology evaluation and biopsy results. ? ?Cardiology is asked for anticoagulation recommendation. ? ?Sodium 129>>126 ?Potassium 4.3>>>5.0 ?Creatinine 1.06 ?Hemoglobin 8.6>>9.2 ? ? ?Past Medical History:  ?Diagnosis Date  ? Atrial fibrillation ([place])   ? BENIGN PROSTATIC HYPERTROPHY   ? Cataract   ? DIVERTICULOSIS, COLON   ? DM   ? not diabetic-has been off metformin for about 2 years as of 09/2020 pr daughter  ? GERD    ? Heart murmur   ? Hernia   ? HYPERCHOLESTEROLEMIA   ? HYPERTENSION   ? Lung cancer ([place])   ? Moderate aortic stenosis 10/30/2011  ? echo 2013  ? Prostate cancer [facility])   ? s/p seed implant  ? Recurrent right pleural effusion   ? ? ?Past Surgical History:  ?Procedure Laterality Date  ? BRONCHIAL BIOPSY  10/17/2020  ? Procedure: BRONCHIAL BIOPSIES;  Surgeon: Garner Nash, DO;  Location: [place] [facility];  Service: Pulmonary;;  ? BRONCHIAL BRUSHINGS  10/17/2020  ? Procedure: BRONCHIAL BRUSHINGS;  Surgeon: Garner Nash, DO;  Location: [place];  Service: Pulmonary;;  ? BRONCHIAL NEEDLE ASPIRATION BIOPSY  10/17/2020  ? Procedure: BRONCHIAL NEEDLE ASPIRATION BIOPSIES;  Surgeon: Garner Nash, DO;  Location: [place];  Service: Pulmonary;;  ? BRONCHIAL WASHINGS  10/17/2020  ? Procedure: BRONCHIAL WASHINGS;  Surgeon: Garner Nash, DO;  Location: [place] [facility];  Service: Pulmonary;;  ? CATARACT EXTRACTION W/ INTRAOCULAR LENS IMPLANT  02/2013  ? R, then L 4 weeks later - [place]  ? CHEST TUBE INSERTION N/A 08/08/2021  ? Procedure: INSERTION PLEURAL DRAINAGE CATHETER;  Surgeon: Garner Nash, DO;  Location: [place] [facility];  Service: Pulmonary;  Laterality: N/A;  ? FIDUCIAL MARKER PLACEMENT  10/17/2020  ? Procedure: FIDUCIAL MARKER PLACEMENT;  Surgeon: Garner Nash, DO;  Location: [place] [facility];  Service: Pulmonary;;  ? INGUINAL HERNIA REPAIR    ? Right  ? INGUINAL HERNIA REPAIR Left 07/15/2014  ? Procedure: LEFT INGUINAL HERNIA REPAIR WITH MESH;  Surgeon: Armandina Gemma, MD;  Location: [facility];  Service: General;  Laterality: Left;  ? INSERTION OF MESH Left 07/15/2014  ? Procedure: INSERTION OF MESH;  Surgeon: Armandina Gemma, MD;  Location: [facility];  Service: General;  Laterality: Left;  ? RADIOACTIVE SEED IMPLANT    ? THORACENTESIS N/A 01/05/2021  ? Procedure: THORACENTESIS;  Surgeon: Candee Furbish, MD;  Location: [facility];  Service: Pulmonary;  Laterality: N/A;  ? VIDEO BRONCHOSCOPY WITH ENDOBRONCHIAL NAVIGATION  Left 10/17/2020  ? Procedure: VIDEO BRONCHOSCOPY WITH ENDOBRONCHIAL NAVIGATION;  Surgeon: Garner Nash, DO;  Location: [place];  Service: Pulmonary;  Laterality: Left;  ? VIDEO BRONCHOSCOPY WITH ENDOBRONCHIAL ULTRASOUND Bilateral 10/17/2020  ? Procedure: VIDEO BRONCHOSCOPY WITH ENDOBRONCHIAL ULTRASOUND;  Surgeon: Garner Nash, DO;  Location: [place];  Service: Pulmonary;  Laterality: Bilateral;  ?  ? ?Inpatient Medications: ?Scheduled Meds: ? calcium-vitamin D  0.5 tablet Oral Q breakfast  ? metoprolol tartrate  25 mg Oral BID  ? [START ON 09/19/2021] pantoprazole  40 mg Intravenous Q12H  ? polyethylene glycol  17 g Oral Daily  ? simvastatin  20 mg Oral q1800  ? tamsulosin  0.4 mg Oral Daily  ? ?Continuous Infusions: ? ?PRN Meds: ?acetaminophen **OR** acetaminophen, fluticasone, levalbuterol, melatonin, ondansetron **OR** ondansetron (ZOFRAN) IV ? ?Allergies:    ?Allergies  ?Allergen Reactions  ? Lisinopril Rash  ? ? ?Social History:   ?Social History  ? ?Socioeconomic History  ? Marital status: Widowed  ?  Spouse name: Not on file  ? Number of children: 2  ? Years of education: Not on file  ? Highest education level: Not on file  ?Occupational History  ? Occupation: Retired   ?  Comment: Retired  ?Tobacco Use  ? Smoking status: Former  ?  Packs/day: 0.50  ?  Years: 35.00  ?  Pack years: 17.50  ?  Types: Cigarettes  ?  Quit date: 10/30/1966  ?  Years since quitting: 54.9  ? Smokeless tobacco: Never  ?Vaping Use  ? Vaping Use: Never used  ?Substance and Sexual Activity  ? Alcohol use: No  ? Drug use: No  ? Sexual activity: Not on file  ?Other Topics Concern  ? Not on file  ?Social History Narrative  ? Pt says his diet is excellent  ? Daily caffeine   ? ?Social Determinants of Health  ? ?Financial Resource Strain: Low Risk   ? Difficulty of Paying Living Expenses: Not hard at all  ?Food Insecurity: No Food Insecurity  ? Worried About Charity fundraiser in the Last Year: Never true  ? Ran Out of Food in  the Last Year: Never true  ?Transportation Needs: No Transportation Needs  ? Lack of Transportation (Medical): No  ? Lack of Transportation (Non-Medical): No  ?Physical Activity: Inactive  ? Days of Exercise per Week: 0 days  ? Minutes of Exercise per Session: 0 min  ?Stress: No Stress Concern Present  ? Feeling of Stress : Not at all  ?Social Connections: Socially Isolated  ? Frequency of Communication with Friends and Family: More than three times a week  ? Frequency of Social Gatherings with Friends and Family: Once a week  ? Attends Religious Services: Never  ? Active Member of Clubs or Organizations: No  ? Attends Archivist Meetings: Never  ? Marital Status: Widowed  ?Intimate Partner Violence: Not At Risk  ? Fear of Current or Ex-Partner: No  ? Emotionally Abused: No  ? Physically Abused: No  ? Sexually Abused: No  ?  ?Family History:   ? ?  Family History  ?Problem Relation Age of Onset  ? Heart attack Paternal Uncle   ?     in his 85s  ? Atrial fibrillation Daughter   ? Colon cancer Neg Hx   ?  ? ?ROS:  ?Please see the history of present illness.  ? ?All other ROS reviewed and negative.    ? ?Physical Exam/Data:  ? ?Vitals:  ? 09/17/21 0447 09/17/21 0747 09/17/21 0800 09/17/21 1053  ?BP: 113/80 126/79 (!) 117/59 108/69  ?Pulse: 99 93 99 90  ?Resp: 20 19 14 19   ?Temp: 97.6 ?F (36.4 ?C) 98 ?F (36.7 ?C) 97.9 ?F (36.6 ?C) 98 ?F (36.7 ?C)  ?TempSrc: Oral Oral Temporal Oral  ?SpO2: 98% 97% 98% 100%  ?Weight:   70.6 kg   ?Height:   5\' 10"  (1.778 m)   ? ? ?Intake/Output Summary (Last 24 hours) at 09/17/2021 1354 ?Last data filed at 09/17/2021 1242 ?Gross per 24 hour  ?Intake 426.39 ml  ?Output 1100 ml  ?Net -673.61 ml  ? ? ?  09/17/2021  ?  8:00 AM 09/17/2021  ? 12:01 AM 09/16/2021  ?  1:09 AM  ?Last 3 Weights  ?Weight (lbs) 155 lb 10.3 oz 155 lb 10.3 oz 153 lb 10.6 oz  ?Weight (kg) 70.6 kg 70.6 kg 69.7 kg  ?   ?Body mass index is 22.33 kg/m?.  ?General:  Elderly frail ill appearing in no acute distress ?HEENT:  normal ?Neck: no JVD ?Vascular: No carotid bruits; Distal pulses 2+ bilaterally ?Cardiac:  normal S1, S2;irregularly irregular, 3/6 systolic murmur  ?Lungs:   Right posterior Pleurx catheter  ?Abd: soft,

## 2021-09-17 NOTE — Evaluation (Signed)
Occupational Therapy Evaluation ?Patient Details ?Name: Adam Briggs ?MRN: [id number] ?DOB: 1930-04-11 ?Today's Date: 09/17/2021 ? ? ?History of Present Illness Patient is a 88 yo male presenting to the ED on 09/15/21  with increased SOB and edema in bilateral legs. Admitted with GI bleed with HG at 8.6 on the same day. PMH history includes: NSCLC s/p radiation therapy, A.Fib on eliquis, HTN, cirrhosis, Plurex catheter placed 3/23  ? ?Clinical Impression ?  ?Prior to this admission, patient living with daughter and daughter helping with IADLs and medication management (daughter lives two doors down but pretty much lives with her father to ensure safety). Patient states he could do all of his ADLs at home with extra time, with daughter in agreement. Currently, patient is min A for ADLs and transfers (handed off to PT for mobility therefore reference PT eval) with increased fatigue and activity tolerance. OT recommending HHOT at discharge, OT will continue to follow acutely.  ?   ? ?Recommendations for follow up therapy are one component of a multi-disciplinary discharge planning process, led by the attending physician.  Recommendations may be updated based on patient status, additional functional criteria and insurance authorization.  ? ?Follow Up Recommendations ? Home health OT  ?  ?Assistance Recommended at Discharge Intermittent Supervision/Assistance  ?Patient can return home with the following A little help with walking and/or transfers;A little help with bathing/dressing/bathroom;Assistance with cooking/housework;Direct supervision/assist for medications management;Help with stairs or ramp for entrance;Assist for transportation;Direct supervision/assist for financial management ? ?  ?Functional Status Assessment ? Patient has had a recent decline in their functional status and demonstrates the ability to make significant improvements in function in a reasonable and predictable amount of time.  ?Equipment  Recommendations ? None recommended by OT (Patient has DME needed)  ?  ?Recommendations for Other Services   ? ? ?  ?Precautions / Restrictions Precautions ?Precautions: Fall ?Restrictions ?Weight Bearing Restrictions: No  ? ?  ? ?Mobility Bed Mobility ?Overal bed mobility: Needs Assistance ?Bed Mobility: Supine to Sit ?  ?  ?Supine to sit: Supervision ?  ?  ?General bed mobility comments: minimal increase of time, HOB elevated ?  ? ?Transfers ?Overall transfer level: Needs assistance ?Equipment used: Rolling walker (2 wheels) ?Transfers: Sit to/from Stand ?Sit to Stand: Min assist ?  ?  ?  ?  ?  ?General transfer comment: min cues for hand placement, handed off to PT for ambulation, see note ?  ? ?  ?Balance Overall balance assessment: Mild deficits observed, not formally tested ?  ?  ?  ?  ?  ?  ?  ?  ?  ?  ?  ?  ?  ?  ?  ?  ?  ?  ?   ? ?ADL either performed or assessed with clinical judgement  ? ?ADL Overall ADL's : Needs assistance/impaired ?Eating/Feeding: Set up;Sitting ?  ?Grooming: Set up;Sitting ?  ?Upper Body Bathing: Set up;Sitting ?  ?Lower Body Bathing: Minimal assistance;Sitting/lateral leans;Sit to/from stand ?  ?Upper Body Dressing : Set up;Sitting ?  ?Lower Body Dressing: Minimal assistance;Sitting/lateral leans;Sit to/from stand ?  ?Toilet Transfer: Minimal assistance;Rolling walker (2 wheels) ?  ?  ?  ?  ?  ?Functional mobility during ADLs: Minimal assistance;Cueing for sequencing;Cueing for safety;Rolling walker (2 wheels) ?General ADL Comments: Patient presenting with decreased activity tolerance and fatigue  ? ? ? ?Vision Baseline Vision/History: 1 Wears glasses ([place]) ?Ability to See in Adequate Light: 0 Adequate ?Patient Visual Report: No change from  baseline ?   ?   ?Perception   ?  ?Praxis   ?  ? ?Pertinent Vitals/Pain Pain Assessment ?Pain Assessment: No/denies pain  ? ? ? ?Hand Dominance   ?  ?Extremity/Trunk Assessment Upper Extremity Assessment ?Upper Extremity Assessment: Overall WFL  for tasks assessed ?  ?Lower Extremity Assessment ?Lower Extremity Assessment: Defer to PT evaluation ?  ?Cervical / Trunk Assessment ?Cervical / Trunk Assessment: Kyphotic ?  ?Communication Communication ?Communication: HOH ?  ?Cognition Arousal/Alertness: Awake/alert ?Behavior During Therapy: [facility] for tasks assessed/performed ?Overall Cognitive Status: Within Functional Limits for tasks assessed ?  ?  ?  ?  ?  ?  ?  ?  ?  ?  ?  ?  ?  ?  ?  ?  ?  ?  ?  ?General Comments    ? ?  ?Exercises   ?  ?Shoulder Instructions    ? ? ?Home Living Family/patient expects to be discharged to:: Private residence ?Living Arrangements: Alone ?Available Help at Discharge: Family;Available 24 hours/day (Daughter is two doors down but primarily lives at the house) ?Type of Home: House ?Home Access: Stairs to enter ?Entrance Stairs-Number of Steps: 1 ?Entrance Stairs-Rails: Can reach both ?Home Layout: One level ?  ?  ?Bathroom Shower/Tub: Walk-in shower ?  ?Bathroom Toilet: Handicapped height ?  ?  ?Home Equipment: Rollator (4 wheels);Cane - single point;Grab bars - toilet;Grab bars - tub/shower;Wheelchair - manual;BSC/3in1 ?  ?  ?  ? ?  ?Prior Functioning/Environment Prior Level of Function : Needs assist ?  ?  ?  ?  ?  ?  ?Mobility Comments: patient usually ambulating with cane, has rollator but does not often use it ?ADLs Comments: can complete with extra time, daughter completes ADLs ?  ? ?  ?  ?OT Problem List: Decreased strength;Decreased activity tolerance;Impaired balance (sitting and/or standing);Decreased coordination;Decreased safety awareness;Decreased knowledge of use of DME or AE ?  ?   ?OT Treatment/Interventions: Self-care/ADL training;Therapeutic exercise;Energy conservation;DME and/or AE instruction;Therapeutic activities;Patient/family education;Balance training;Manual therapy  ?  ?OT Goals(Current goals can be found in the care plan section) Acute Rehab OT Goals ?Patient Stated Goal: to get stronger ?OT Goal  Formulation: With patient/family ?Time For Goal Achievement: 10/01/21 ?Potential to Achieve Goals: Good ?ADL Goals ?Pt Will Perform Lower Body Bathing: sitting/lateral leans;sit to/from stand;with adaptive equipment;Independently ?Pt Will Perform Lower Body Dressing: Independently;with adaptive equipment;sitting/lateral leans;sit to/from stand ?Pt Will Transfer to Toilet: Independently;ambulating;grab bars ?Pt/caregiver will Perform Home Exercise Program: Increased ROM;Increased strength;Both right and left upper extremity;With theraband;With written HEP provided;With Supervision ?Additional ADL Goal #1: Patient will increase activity tolerance by engaging in functional task in standing for 3-5 minutes without need for seated rest breaks.  ?OT Frequency: Min 2X/week ?  ? ?Co-evaluation   ?  ?  ?  ?  ? ?  ?AM-PAC OT "6 Clicks" Daily Activity     ?Outcome Measure Help from another person eating meals?: A Little ?Help from another person taking care of personal grooming?: A Little ?Help from another person toileting, which includes using toliet, bedpan, or urinal?: A Little ?Help from another person bathing (including washing, rinsing, drying)?: A Little ?Help from another person to put on and taking off regular upper body clothing?: A Little ?Help from another person to put on and taking off regular lower body clothing?: A Little ?6 Click Score: 18 ?  ?End of Session Equipment Utilized During Treatment: Rolling walker (2 wheels);Gait belt;Oxygen ?Nurse Communication: Mobility status ? ?Activity Tolerance: Patient  tolerated treatment well;Patient limited by fatigue ?Patient left: Other (comment) (handed off to PT sitting EOB) ? ?OT Visit Diagnosis: Unsteadiness on feet (R26.81);Other abnormalities of gait and mobility (R26.89);Muscle weakness (generalized) (M62.81)  ?              ?Time: 1093-2355 ?OT Time Calculation (min): 17 min ?Charges:  OT General Charges ?$OT Visit: 1 Visit ?OT Evaluation ?$OT Eval Moderate  Complexity: 1 Mod ? ?Adam Briggs, OTR/L ?Acute Rehabilitation Services ?[phone] ?[phone]  ? ?Adam Briggs ?09/17/2021, 1:50 PM ?

## 2021-09-18 ENCOUNTER — Other Ambulatory Visit: Payer: Self-pay | Admitting: Hematology

## 2021-09-18 ENCOUNTER — Inpatient Hospital Stay (HOSPITAL_COMMUNITY): Payer: Medicare Other

## 2021-09-18 ENCOUNTER — Encounter (HOSPITAL_COMMUNITY): Payer: Self-pay | Admitting: Gastroenterology

## 2021-09-18 DIAGNOSIS — K625 Hemorrhage of anus and rectum: Secondary | ICD-10-CM

## 2021-09-18 DIAGNOSIS — K6289 Other specified diseases of anus and rectum: Secondary | ICD-10-CM

## 2021-09-18 DIAGNOSIS — D62 Acute posthemorrhagic anemia: Secondary | ICD-10-CM | POA: Diagnosis not present

## 2021-09-18 DIAGNOSIS — J9621 Acute and chronic respiratory failure with hypoxia: Secondary | ICD-10-CM | POA: Diagnosis not present

## 2021-09-18 DIAGNOSIS — K922 Gastrointestinal hemorrhage, unspecified: Secondary | ICD-10-CM | POA: Diagnosis not present

## 2021-09-18 DIAGNOSIS — C20 Malignant neoplasm of rectum: Secondary | ICD-10-CM | POA: Insufficient documentation

## 2021-09-18 DIAGNOSIS — I480 Paroxysmal atrial fibrillation: Secondary | ICD-10-CM | POA: Diagnosis not present

## 2021-09-18 LAB — BASIC METABOLIC PANEL
Anion gap: 9 (ref 5–15)
BUN: 32 mg/dL — ABNORMAL HIGH (ref 8–23)
CO2: 35 mmol/L — ABNORMAL HIGH (ref 22–32)
Calcium: 8.5 mg/dL — ABNORMAL LOW (ref 8.9–10.3)
Chloride: 82 mmol/L — ABNORMAL LOW (ref 98–111)
Creatinine, Ser: 1.06 mg/dL (ref 0.61–1.24)
GFR, Estimated: >60 mL/min (ref >60)
Glucose, Bld: 141 mg/dL — ABNORMAL HIGH (ref 70–99)
Potassium: 4.6 mmol/L (ref 3.5–5.1)
Sodium: 126 mmol/L — ABNORMAL LOW (ref 135–145)

## 2021-09-18 LAB — CBC
HCT: 30.8 % — ABNORMAL LOW (ref 39.0–52.0)
Hemoglobin: 9.2 g/dL — ABNORMAL LOW (ref 13.0–17.0)
MCH: 25.3 pg — ABNORMAL LOW (ref 26.0–34.0)
MCHC: 29.9 g/dL — ABNORMAL LOW (ref 30.0–36.0)
MCV: 84.6 fL (ref 80.0–100.0)
Platelets: 194 10*3/uL (ref 150–400)
RBC: 3.64 MIL/uL — ABNORMAL LOW (ref 4.22–5.81)
RDW: 14.5 % (ref 11.5–15.5)
WBC: 7.9 10*3/uL (ref 4.0–10.5)
nRBC: 0 % (ref 0.0–0.2)

## 2021-09-18 MED ORDER — PANTOPRAZOLE SODIUM 40 MG PO TBEC
40.0000 mg | DELAYED_RELEASE_TABLET | Freq: Two times a day (BID) | ORAL | Status: DC
  Administered 2021-09-18: 40 mg via ORAL
  Filled 2021-09-18: qty 1

## 2021-09-18 MED ORDER — PANTOPRAZOLE SODIUM 40 MG PO TBEC: 40.0000 mg | DELAYED_RELEASE_TABLET | Freq: Two times a day (BID) | ORAL | 0 refills | Status: AC

## 2021-09-18 NOTE — Progress Notes (Signed)
At [phone]   Discharge instructions reviewed with pt and his daughter.  ?Copy of instructions  given to pt, informed of scripts sent to pt's pharmacy. Portable O2 tank was delivered to pt's room for pt's transport home. Pt was d/c'd on 3L/[place] O2.  ? ? ?At 1900 Pt d/c'd via wheelchair with belongings, with daughter.          ?Escorted by unit NT. ?

## 2021-09-18 NOTE — Progress Notes (Signed)
Mobility Specialist Progress Note: ? ? 09/18/21 1340  ?Mobility  ?Activity Transferred to/from [facility]  ?Level of Assistance Minimal assist, patient does 75% or more  ?Assistive Device None  ?Distance Ambulated (ft) 2 ft  ?Activity Response Tolerated well  ?$Mobility charge 1 Mobility  ? ?Pt requesting to get back to bed, BM unsuccessful. Required minA to stand from [facility], and pivot to bed. Placed SCDs back on per request. Left with all needs met.  ? ?Nelta Numbers ?Acute Rehab ?Phone: [phone] ?Office Phone: [phone] ? ?

## 2021-09-18 NOTE — Progress Notes (Signed)
? ? ? ? Progress Note ? ? Subjective  ?Patient was ambulating in the hallway, with PT. Somewhat labored breathing after walking. He has continued to have some rectal bleeding.  ? ? Objective  ? ?Vital signs in last 24 hours: ?Temp:  [97.6 ?F (36.4 ?C)-97.9 ?F (36.6 ?C)] 97.6 ?F (36.4 ?C) (05/02 0455) ?Pulse Rate:  [92-105] 105 (05/02 0455) ?Resp:  [18-20] 20 (05/02 0730) ?BP: (104-124)/(54-88) 122/88 (05/02 0730) ?SpO2:  [92 %-98 %] 98 % (05/02 0730) ?Weight:  [70 kg] 70 kg (05/02 0400) ?Last BM Date : 09/17/21 ?General:    white male in NAD ?Neurologic:  Alert and oriented,  grossly normal neurologically. ?Psych:  Cooperative. Normal mood and affect. ? ?Intake/Output from previous day: ?05/01 0701 - 05/02 0700 ?In: 1039.4 [P.O.:657; I.V.:100; IV Piggyback:282.4] ?Out: 1200 [Urine:700; Drains:500] ?Intake/Output this shift: ?No intake/output data recorded. ? ?Lab Results: ?Recent Labs  ?  09/16/21 ?0241 09/17/21 ?0148 09/18/21 ?0443  ?WBC 6.5 6.4 7.9  ?HGB 8.6* 9.2* 9.2*  ?HCT 27.9* 29.7* 30.8*  ?PLT 184 194 194  ? ?BMET ?Recent Labs  ?  09/16/21 ?0241 09/17/21 ?0148 09/18/21 ?9371  ?NA 129* 126* 126*  ?K 4.3 5.0 4.6  ?CL 83* 82* 82*  ?CO2 39* 36* 35*  ?GLUCOSE 87 86 141*  ?BUN 31* 29* 32*  ?CREATININE 1.10 1.06 1.06  ?CALCIUM 8.9 8.5* 8.5*  ? ?LFT ?Recent Labs  ?  09/15/21 ?1822  ?PROT 5.2*  ?ALBUMIN 2.8*  ?AST 16  ?ALT 15  ?ALKPHOS 41  ?BILITOT 0.5  ? ?PT/INR ?Recent Labs  ?  09/15/21 ?1822  ?LABPROT 17.0*  ?INR 1.4*  ? ? ?Studies/Results: ?CT HEAD WO CONTRAST (5MM) ? ?Result Date: 09/18/2021 ?CLINICAL DATA:  Mental status change, unknown cause. EXAM: CT HEAD WITHOUT CONTRAST TECHNIQUE: Contiguous axial images were obtained from the base of the skull through the vertex without intravenous contrast. RADIATION DOSE REDUCTION: This exam was performed according to the departmental dose-optimization program which includes automated exposure control, adjustment of the mA and/or kV according to patient size and/or use of  iterative reconstruction technique. COMPARISON:  None Available. FINDINGS: Brain: There is no evidence of an acute infarct, intracranial hemorrhage, mass, midline shift, or extra-axial fluid collection. Hypodensities in the cerebral white matter bilaterally are nonspecific but compatible with mild chronic small vessel ischemic disease. Mild cerebral atrophy is within normal limits for age. Vascular: Calcified atherosclerosis at the skull base. No hyperdense vessel. Skull: No fracture or suspicious osseous lesion. Sinuses/Orbits: Visualized paranasal sinuses and mastoid air cells are clear. Bilateral cataract extraction. Other: None. IMPRESSION: 1. No evidence of acute intracranial abnormality. 2. Mild chronic small vessel ischemic disease. Electronically Signed   By: Logan Bores M.D.   On: 09/18/2021 11:31   ? ? ? ? Assessment / Plan:   ? ?88 y/o male with a history of AF on Eliquis, small cell lung cancer, prostate cancer s/p implantation, who presented with rectal bleeding and anemia. Flex sig yesterday showed a distal rectal mass invading the dentate line / anal canal. 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  ? ?Discussed the results with the patient and his family who are at bedside. This was the expected diagnosis following the procedure. Dr. Burr Medico has seen the patient, recommended MRI pelvis for staging. Not a good surgical candidate, perhaps radiation may be best option but defer to Oncology. He will continue to have rectal bleeding until the malignancy is treated. Will need close monitoring of Hgb and transfusion as  needed. I discussed all of this with the family, they agreed with plan and understand. All questions answered. ? ?Call with questions, will sign off for now. ? ?Jolly Mango, MD ?[facility] ? ? ? ? ? ?

## 2021-09-18 NOTE — Progress Notes (Signed)
Physical Therapy Treatment ?Patient Details ?Name: Adam Briggs ?MRN: [id number] ?DOB: 1930-03-07 ?Today's Date: 09/18/2021 ? ? ?History of Present Illness Patient is a 88 yo male presenting to the ED on 09/15/21  with increased SOB and edema in bilateral legs. Admitted with GI bleed with HG at 8.6 on the same day. Flex sig revealed rectal mass.  PMH history includes: NSCLC s/p radiation therapy, A.Fib on eliquis, HTN, cirrhosis, Plurex catheter placed 3/23. ? ?  ?PT Comments  ? ? Pt received in supine, pleasantly agreeable to therapy session with emphasis on safety with sit<>stand transfers, gait safety with 4WW and activity pacing. Pt needing 3L/min O2 [place] to maintain SpO2 90% and greater and cues for standing breaks/pursed-lip breathing with increased WOB with exertion. Pt desat on 2L O2  with transition from supine to EOB. Pt needing up to min guard assist and mod cues for safe technique with transfers and gait for longer household distances today. Pt continues to benefit from PT services to progress toward functional mobility goals.    ?Recommendations for follow up therapy are one component of a multi-disciplinary discharge planning process, led by the attending physician.  Recommendations may be updated based on patient status, additional functional criteria and insurance authorization. ? ?Follow Up Recommendations ? Home health PT ?  ?  ?Assistance Recommended at Discharge Intermittent Supervision/Assistance  ?Patient can return home with the following A little help with walking and/or transfers;Help with stairs or ramp for entrance ?  ?Equipment Recommendations ? None recommended by PT;Other (comment) (recommend he utilize his portable pulse oximeter at home with exertion for activity pacing)  ?  ?Recommendations for Other Services   ? ? ?  ?Precautions / Restrictions Precautions ?Precautions: Fall ?Precaution Comments: monitor SpO2, possible delirium waxing/waning in AM ?Restrictions ?Weight Bearing  Restrictions: No  ?  ? ?Mobility ? Bed Mobility ?Overal bed mobility: Needs Assistance ?Bed Mobility: Supine to Sit, Sit to Supine ?  ?  ?Supine to sit: Min assist (flat HOB no rails) ?Sit to supine: Min assist (flat HOB using rail) ?  ?General bed mobility comments: pt cued for technique to avoid using therapist to pull up on but ultimately unable to raise trunk without HHA possibly due to [facility] vs poor carryover of instruction. From flat HOB no rails per home setup. ?  ? ?Transfers ?Overall transfer level: Needs assistance ?Equipment used: Rollator (4 wheels) ?Transfers: Sit to/from Stand ?Sit to Stand: Min guard ?  ?  ?  ?  ?  ?General transfer comment: Assist for safety and verbal cues for hand placement, pt ignoring cues on first trial and unsafely pulling up on 4WW although walker tipping back toward him; pt able to follow cues on second trial with increased time/multimodal cues. ?  ? ?Ambulation/Gait ?Ambulation/Gait assistance: Min guard ?Gait Distance (Feet): 150 Feet (149ft, seated break, 116ft) ?Assistive device: Rollator (4 wheels) ?Gait Pattern/deviations: Step-through pattern, Decreased stride length, Trunk flexed ?Gait velocity: decr ?  ?  ?General Gait Details: Assist for safety and lines, cues for posture, proximity to RW, activity pacing and pursed-lip breathing. ? ? ?Stairs ?  ?  ?  ?  ?  ? ? ?Wheelchair Mobility ?  ? ?Modified Rankin (Stroke Patients Only) ?  ? ? ?  ?Balance Overall balance assessment: Needs assistance ?Sitting-balance support: No upper extremity supported, Feet supported ?Sitting balance-Leahy Scale: Good ?  ?  ?Standing balance support: During functional activity, Reliant on assistive device for balance ?Standing balance-Leahy Scale: Poor ?Standing balance  comment: pt static standing at RW no LOB but min guard for dynamic standing due to unfamiliar rollator and mild unsteadiness ?  ?  ?  ?  ?  ?  ?  ?  ?  ?  ?  ?  ? ?  ?Cognition Arousal/Alertness: Awake/alert ?Behavior During  Therapy: [facility] for tasks assessed/performed ?Overall Cognitive Status: Within Functional Limits for tasks assessed ?  ?  ?  ?  ?  ?  ?  ?  ?  ?  ?  ?  ?  ?  ?  ?  ?General Comments: Pt following simple 1-step commands with increased time, pt having more difficulty with multi-step commands, possibly due to [facility] ?  ?  ? ?  ?Exercises   ? ?  ?General Comments General comments (skin integrity, edema, etc.): SpO2 97-99% on 2.5L O2 [place] at rest, desat to 86% with transfer to EOB, O2 increased to 3L/min and maintains 90-97% on 3L with exertion; HR max 110 bpm. ?  ?  ? ?Pertinent Vitals/Pain Pain Assessment ?Pain Assessment: No/denies pain  ? ? ? ?PT Goals (current goals can now be found in the care plan section) Acute Rehab PT Goals ?Patient Stated Goal: return home ?PT Goal Formulation: With patient ?Time For Goal Achievement: 10/01/21 ?Progress towards PT goals: Progressing toward goals ? ?  ?Frequency ? ? ? Min 3X/week ? ? ? ?  ?PT Plan Current plan remains appropriate  ? ? ?   ?AM-PAC PT "6 Clicks" Mobility   ?Outcome Measure ? Help needed turning from your back to your side while in a flat bed without using bedrails?: None ?Help needed moving from lying on your back to sitting on the side of a flat bed without using bedrails?: A Little ?Help needed moving to and from a bed to a chair (including a wheelchair)?: A Little ?Help needed standing up from a chair using your arms (e.g., wheelchair or bedside chair)?: A Little ?Help needed to walk in hospital room?: A Little ?Help needed climbing 3-5 steps with a railing? : A Little ?6 Click Score: 19 ? ?  ?End of Session Equipment Utilized During Treatment: Oxygen;Gait belt ?Activity Tolerance: Patient tolerated treatment well ?Patient left: with call bell/phone within reach;in bed;with bed alarm set;with family/visitor present;Other (comment);with SCD's reapplied (B heels floated, extra small pillow added for cx support per pt request) ?Nurse Communication: Mobility status;Other  (comment) (needed 3L/min for ambulation) ?PT Visit Diagnosis: Other abnormalities of gait and mobility (R26.89);Muscle weakness (generalized) (M62.81) ?  ? ? ?Time: 6701-4103 ?PT Time Calculation (min) (ACUTE ONLY): 36 min ? ?Charges:  $Gait Training: 8-22 mins ?$Therapeutic Activity: 8-22 mins          ?          ? ?Ernestene Coover P., PTA ?Acute Rehabilitation Services ?Secure Chat Preferred 9a-5:30pm ?Office: [phone]  ? ? ?Kara Pacer Maggy Wyble ?09/18/2021, 4:25 PM ? ?

## 2021-09-18 NOTE — TOC Initial Note (Signed)
Transition of Care (TOC) - Initial/Assessment Note  ? ? ?Patient Details  ?Name: Adam Briggs ?MRN: [id number] ?Date of Birth: 1929-11-20 ? ?Transition of Care (TOC) CM/SW Contact:    ?Zenon Mayo, RN ?Phone Number: ?09/18/2021, 12:03 PM ? ?Clinical Narrative:                 ?Patient is from home, daughter assists with pleurx drains at home, he is active with [facility], [name] , his daughter, would like to continue with [facility],  will add HHPT, HHOT to [facility] services.   Patient will need to get head CT, oxygen screen today per MD.  Patient has home oxygen with [facility]. Daughter [name] states she will transport patient home at discharge.  TOC will continue to follow for dc needs.  ? ?Expected Discharge Plan: [place] ?Barriers to Discharge: No Barriers Identified ? ? ?Patient Goals and CMS Choice ?Patient states their goals for this hospitalization and ongoing recovery are:: return home ?CMS Medicare.gov Compare Post Acute Care list provided to:: Patient Represenative (must comment) ?Choice offered to / list presented to : Adult Children ? ?Expected Discharge Plan and Services ?Expected Discharge Plan: [place] ?In-house Referral: NA ?Discharge Planning Services: CM Consult ?Post Acute Care Choice: Home Health ?Living arrangements for the past 2 months: [place] ?                ?  ?DME Agency: NA ?  ?  ?  ?HH Arranged: RN, Disease Management, PT, OT ?[place] Agency: [place] ?Date HH Agency Contacted: 09/18/21 ?Time [place]: 9983 ?Representative spoke with at [place]: Tommi Rumps ? ?Prior Living Arrangements/Services ?Living arrangements for the past 2 months: [place] ?Lives with:: Adult Children ?Patient language and need for interpreter reviewed:: Yes ?Do you feel safe going back to the place where you live?: Yes      ?Need for Family Participation in Patient Care: Yes (Comment) ?Care giver support system in place?:  Yes (comment) ?Current home services: Home RN, DME (has HHRN with [facility], pluerx drains and home oxygen 3 liters with [facility]) ?Criminal Activity/Legal Involvement Pertinent to Current Situation/Hospitalization: No - Comment as needed ? ?Activities of Daily Living ?Home Assistive Devices/Equipment: Cane (specify quad or straight) ?ADL Screening (condition at time of admission) ?Patient's cognitive ability adequate to safely complete daily activities?: Yes ?Is the patient deaf or have difficulty hearing?: Yes ?Does the patient have difficulty seeing, even when wearing glasses/contacts?: No ?Does the patient have difficulty concentrating, remembering, or making decisions?: No ?Patient able to express need for assistance with ADLs?: Yes ?Does the patient have difficulty dressing or bathing?: Yes ?Independently performs ADLs?: Yes (appropriate for developmental age) ?Does the patient have difficulty walking or climbing stairs?: Yes ?Weakness of Legs: Both ?Weakness of Arms/Hands: None ? ?Permission Sought/Granted ?  ?  ?   ?   ?   ?   ? ?Emotional Assessment ?Appearance:: Appears stated age ?Attitude/Demeanor/Rapport: Engaged ?Affect (typically observed): Appropriate ?Orientation: : Oriented to Self, Oriented to Place, Oriented to  Time, Oriented to Situation ?Alcohol / Substance Use: Not Applicable ?Psych Involvement: No (comment) ? ?Admission diagnosis:  GI bleed [K92.2] ?Patient Active Problem List  ? Diagnosis Date Noted  ? Rectal bleeding   ? Rectal mass   ? GI bleed 09/15/2021  ? ABLA (acute blood loss anemia) 09/15/2021  ? Hyponatremia 09/15/2021  ? Recurrent pleural effusion 08/08/2021  ?  Acute on chronic respiratory failure with hypoxia ([place]) 07/25/2021  ? Rash 02/02/2021  ? Pressure injury of skin 01/14/2021  ? Pleural effusion, left 01/09/2021  ? Liver lesion 01/09/2021  ? Other cirrhosis of liver ([place]) 01/09/2021  ? Loculated right lateral pneumothorax 01/08/2021  ? Leg swelling 11/27/2020  ?  Malignant neoplasm of upper lobe of left lung ([place]) 10/31/2020  ? Recurrent right pleural effusion   ? Atrial fibrillation ([place])   ? S/P thoracentesis   ? SOB (shortness of breath) 09/15/2020  ? Pleural effusion, right 09/15/2020  ? Acute hypoxemic respiratory failure ([place]) 09/15/2020  ? Atrial fibrillation, new onset ([place]) 09/15/2020  ? Wheezing 04/24/2019  ? Routine general medical examination at a health care facility 06/23/2014  ? Inguinal hernia 05/05/2014  ? Moderate aortic stenosis 10/30/2011  ? Abnormal ECG 09/24/2010  ? Type 2 diabetes mellitus with hyperlipidemia ([place]) 03/26/2010  ? Hyperlipidemia associated with type 2 diabetes mellitus ([place]) 12/28/2007  ? Essential hypertension 02/07/2007  ? ?PCP:  [name], MD ?Pharmacy:   ?[facility], [place] ?[address] ?[place] [address] ?Phone: [phone] Fax: [phone] ? ?[id number] [place], [place] ?[address] ?[place] [address] ?Phone: [phone] Fax: [phone] ? ? ? ? ?Social Determinants of Health (SDOH) Interventions ?  ? ?Readmission Risk Interventions ? ?  09/18/2021  ? 11:59 AM  ?Readmission Risk Prevention Plan  ?Transportation Screening Complete  ?PCP or Specialist Appt within 3-5 Days Complete  ?[place] or Home Care Consult Complete  ?Social Work Consult for [place] Planning/Counseling Complete  ?Palliative Care Screening Complete  ?Medication Review Press photographer) Complete  ? ? ? ?

## 2021-09-18 NOTE — Discharge Summary (Signed)
Physician Discharge Summary  ?Adam Briggs NID:[id number] DOB: 1930/01/01 DOA: 09/15/2021 ? ?PCP: [name], MD ? ?Admit date: 09/15/2021 ?Discharge date: 09/18/2021 ? ?Admitted From: Home ?Disposition: Home ? ?Recommendations for Outpatient Follow-up:  ?Follow up with PCP in 1-2 weeks ?Please obtain BMP/CBC in one week ?Please follow up with cardiology, oncology, GI as scheduled ? ?Home Health: PT OT RN ?Equipment/Devices: No new equipment ? ?Discharge Condition: Stable ?CODE STATUS: Full ?Diet recommendation: As tolerated ? ?Brief/Interim Summary: ?Adam Briggs is a 88 y.o. male with medical history significant of NSCLC s/p radiation therapy, A.Fib on eliquis, HTN, cirrhosis on imaging in Aug 2022. Pt has history of recurrent R sided pleural effusion, unclear cause, is transudate and cytology negative for malignancy -now with chronic indwelling pleural catheter.  Unfortunately due to age pt not a candidate for decortication. Family noted weight gain, orthopnea dyspnea on exertion worsening over the past few weeks evaluated in the outpatient setting with worsening anemia, evaluated in the ED with positive FOBT.  GI called for consult.  Hospitalist called for admission. ?  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  Patient has been evaluated by GI, oncology as well as cardiology here due to A-fib with aberrant PVCs at which time he remained asymptomatic.  At this time patient is otherwise stable and agreeable for discharge home, will need close follow-up for further imaging, staging and further discussion with oncology for treatment of newly diagnosed adenocarcinoma.  Unclear at this time what goals of care are with family but otherwise patient is medically stable for discharge. ? ?Discharge Diagnoses:  ?Principal Problem: ?  GI bleed ?Active Problems: ?  Acute on chronic respiratory failure with hypoxia  (HCC) ?  ABLA (acute blood loss anemia) ?  Atrial fibrillation ([place]) ?  Recurrent right pleural effusion ?  Malignant neoplasm of upper lobe of left lung ([place]) ?  Other cirrhosis of liver ([place]) ?  Hyponatremia ?  Essential hypertension ?  Moderate aortic stenosis ?  Rectal bleeding ?  Rectal mass ? ?Acute symptomatic anemia secondary to ongoing GI bleed ?Secondary to adenocarcinoma ?-Further follow-up and staging as well as imaging per oncology in the outpatient setting ?-Discontinue anticoagulation, risks versus benefits should be discussed with GI as well as cardiology in the outpatient setting; concerned that patient will continue to have rectal bleeding from mass on anticoagulation while A-fib places patient at risk for embolic events. ?  ?Acute on chronic respiratory failure with hypoxia (HCC) ?Secondary to recurrent pleural effusion ?Aortic stenosis, moderate ?-At his current baseline 3 L nasal cannula ? ?Hypervolemic hyponatremia ?-Resolved, resume home furosemide ?  ?Other cirrhosis of liver ([place]) ?Recently diagnosed in Aug 2022 ?No evidence of portal HTN on imaging at that time though ?  ?Malignant neoplasm of upper lobe of left lung ([place]) ?Not currently undergoing chemo, completed radiation. ?Given questionable new liver lesions and new rectal mass oncology following as above ?  ?Atrial fibrillation ([place]) ?Hold eliquis ?Cont beta-blocker ?  ?Moderate aortic stenosis ?Chronic, asymptomatic, continue screening per cardiology outpatient ?  ?Essential hypertension ?Resume diuretics, metoprolol at home ? ?Discharge Instructions ? ?Discharge Instructions   ? ? Discharge patient   Complete by: As directed ?  ? Discharge disposition: 06-Home-Health Care Svc  ? Discharge patient date: 09/18/2021  ? ?  ? ?Allergies as of 09/18/2021   ? ?   Reactions  ? Lisinopril Rash  ? ?  ? ?  ?  Medication List  ?  ? ?STOP taking these medications   ? ?apixaban 5 MG Tabs tablet ?Commonly known as: ELIQUIS ?  ? ?  ? ?TAKE these  medications   ? ?Calcium Carbonate-Vitamin D 600-10 MG-MCG Tabs ?Take 0.5 tablets by mouth daily with breakfast. ?  ?fluticasone 50 MCG/ACT nasal spray ?Commonly known as: FLONASE ?Place 2 sprays into both nostrils daily. ?What changed:  ?when to take this ?reasons to take this ?  ?furosemide 40 MG tablet ?Commonly known as: LASIX ?Take 40 mg by mouth See admin instructions. Marshia Ly ?  ?levalbuterol 45 MCG/ACT inhaler ?Commonly known as: XOPENEX HFA ?Inhale 1 puff into the lungs every 4 (four) hours as needed for wheezing or shortness of breath. ?  ?Lidocaine (Anorectal) 5 % Crea ?Place 1 application. rectally daily as needed (hemorrhoids). ?  ?metoprolol tartrate 25 MG tablet ?Commonly known as: LOPRESSOR ?Take 1 tablet (25 mg total) by mouth 2 (two) times daily. ?  ?MIRALAX PO ?Take 17 g by mouth daily as needed (constipation.). ?  ?multivitamin with minerals Tabs tablet ?Take 1 tablet by mouth in the morning. Centrum Silver for Men 50+ ?  ?OXYGEN ?Inhale 2.5 L into the lungs continuous. ?  ?pantoprazole 40 MG tablet ?Commonly known as: PROTONIX ?Take 1 tablet (40 mg total) by mouth 2 (two) times daily. ?  ?Preparation H 0.25-88.44 % suppository ?Generic drug: shark liver oil-cocoa butter ?Place 1 suppository rectally as needed for hemorrhoids. ?  ?senna-docusate 8.6-50 MG tablet ?Commonly known as: Senokot-S ?Take 1 tablet by mouth at bedtime as needed for mild constipation or moderate constipation. ?  ?simvastatin 20 MG tablet ?Commonly known as: ZOCOR ?Take 1 tablet (20 mg total) by mouth daily at 6 PM. ?  ?spironolactone 50 MG tablet ?Commonly known as: ALDACTONE ?TAKE 1 TABLET BY MOUTH EVERY DAY ?  ?tamsulosin 0.4 MG Caps capsule ?Commonly known as: FLOMAX ?Take 0.4 mg by mouth in the morning. ?  ? ?  ? ? Follow-up Information   ? ? [name], MD Follow up on 10/06/2021.   ?Specialty: Internal Medicine ?Why: @10 :00am ?Contact information: ?[place]  [address] ?[phone] ? ? ?  ?  ? ? Minus Breeding, MD .   ?Specialty: Cardiology ?Contact information: ?[address] ?[address] ?[place] [address] ?[phone] ? ? ?  ?  ? ? Care, [facility] Follow up.   ?Specialty: Home Health Services ?Why: [facility] will contact you to schedule apt times. ?Contact information: ?[address] ?[address] ?[place] [address] ?[phone] ? ? ?  ?  ? ?  ?  ? ?  ? ?Allergies  ?Allergen Reactions  ? Lisinopril Rash  ? ? ?Consultations: ?Cardiology, oncology, GI ? ? ?Procedures/Studies: ?CT HEAD WO CONTRAST (5MM) ? ?Result Date: 09/18/2021 ?CLINICAL DATA:  Mental status change, unknown cause. EXAM: CT HEAD WITHOUT CONTRAST TECHNIQUE: Contiguous axial images were obtained from the base of the skull through the vertex without intravenous contrast. RADIATION DOSE REDUCTION: This exam was performed according to the departmental dose-optimization program which includes automated exposure control, adjustment of the mA and/or kV according to patient size and/or use of iterative reconstruction technique. COMPARISON:  None Available. FINDINGS: Brain: There is no evidence of an acute infarct, intracranial hemorrhage, mass, midline shift, or extra-axial fluid collection. Hypodensities in the cerebral white matter bilaterally are nonspecific but compatible with mild chronic small vessel ischemic disease. Mild cerebral atrophy is within normal limits for age. Vascular:  Calcified atherosclerosis at the skull base. No hyperdense vessel. Skull: No fracture or suspicious osseous lesion. Sinuses/Orbits: Visualized paranasal sinuses and mastoid air cells are clear. Bilateral cataract extraction. Other: None. IMPRESSION: 1. No evidence of acute intracranial abnormality. 2. Mild chronic small vessel ischemic disease. Electronically Signed   By: Logan Bores M.D.   On: 09/18/2021 11:31  ? ?CT ABDOMEN PELVIS W CONTRAST ? ?Result Date: 09/15/2021 ?CLINICAL DATA:  Suspected bowel  obstruction. EXAM: CT ABDOMEN AND PELVIS WITH CONTRAST TECHNIQUE: Multidetector CT imaging of the abdomen and pelvis was performed using the standard protocol following bolus administration of intravenous contrast. RADIATION

## 2021-09-18 NOTE — Progress Notes (Signed)
SATURATION QUALIFICATIONS: (This note is used to comply with regulatory documentation for home oxygen) ? ?Patient Saturations on Room Air at Rest = N/A, pt hypoxic on 2L O2 [place] sitting EOB ? ?Patient Saturations on Room Air while Ambulating = N/A, pt hypoxic on 2L with bed mobility. ? ?Patient Saturations on 3 Liters of oxygen while Ambulating = 90% ? ?Please briefly explain why patient needs home oxygen: Pt hypoxic on 2L with minimal mobility (transitioning from supine to EOB), needed 3L/min O2 via [place] to maintain SpO2 90-97%. When pt desat to 90%, SpO2 improves with brief standing break and cues for pursed-lip breathing.  ?

## 2021-09-18 NOTE — TOC Transition Note (Addendum)
Transition of Care (TOC) - CM/SW Discharge Note ? ? ?Patient Details  ?Name: Adam Briggs ?MRN: [id number] ?Date of Birth: 08-03-1929 ? ?Transition of Care (TOC) CM/SW Contact:  ?Zenon Mayo, RN ?Phone Number: ?09/18/2021, 4:18 PM ? ? ?Clinical Narrative:    ?Patient is from home, daughter assists with pleurx drains at home, he is active with [facility], Adam Briggs , his daughter, would like to continue with [facility],  will add HHPT, HHOT to [facility] services.   Patient will need to get head CT, oxygen screen today per MD.  Patient has home oxygen with [facility]. Daughter Adam Briggs states she will transport patient home at discharge. Patient has oxygen with [facility] in [place] , he needs a tank to go home with, NCM called [facility] , they will bring tank to patient room for dc, phone [phone].  ? ?Final next level of care: [place] ?Barriers to Discharge: No Barriers Identified ? ? ?Patient Goals and CMS Choice ?Patient states their goals for this hospitalization and ongoing recovery are:: return home ?CMS Medicare.gov Compare Post Acute Care list provided to:: Patient Represenative (must comment) ?Choice offered to / list presented to : Adult Children ? ?Discharge Placement ?  ?           ?  ?  ?  ?  ? ?Discharge Plan and Services ?In-house Referral: NA ?Discharge Planning Services: CM Consult ?Post Acute Care Choice: Home Health          ?  ?DME Agency: NA ?  ?  ?  ?HH Arranged: RN, Disease Management, PT, OT ?[place] Agency: [place] ?Date HH Agency Contacted: 09/18/21 ?Time [place]: 0867 ?Representative spoke with at [place]: Tommi Rumps ? ?Social Determinants of Health (SDOH) Interventions ?  ? ? ?Readmission Risk Interventions ? ?  09/18/2021  ? 11:59 AM  ?Readmission Risk Prevention Plan  ?Transportation Screening Complete  ?PCP or Specialist Appt within 3-5 Days Complete  ?[place] or Home Care Consult Complete  ?Social Work Consult for [place] Planning/Counseling Complete  ?Palliative Care Screening Complete  ?Medication Review Press photographer) Complete  ? ? ? ? ? ?

## 2021-09-18 NOTE — Progress Notes (Signed)
?Cardiology Progress Note  ?Patient ID: Adam Briggs ?MRN: [id number] ?DOB: [birth year] ?Date of Encounter: 09/18/2021 ? ?Primary Cardiologist: Minus Breeding, MD ? ?Subjective  ? ?Chief Complaint: SOB ? ?HPI: No chest pain.  Plan for possible discharge today.  Volume status acceptable. ? ?ROS:  ?All other ROS reviewed and negative. Pertinent positives noted in the HPI.    ? ?Inpatient Medications  ?Scheduled Meds: ? calcium-vitamin D  0.5 tablet Oral Q breakfast  ? metoprolol tartrate  25 mg Oral BID  ? pantoprazole  40 mg Oral BID  ? polyethylene glycol  17 g Oral Daily  ? simvastatin  20 mg Oral q1800  ? tamsulosin  0.4 mg Oral Daily  ? ?Continuous Infusions: ? ?PRN Meds: ?acetaminophen **OR** acetaminophen, fluticasone, levalbuterol, melatonin, ondansetron **OR** ondansetron (ZOFRAN) IV  ? ?Vital Signs  ? ?Vitals:  ? 09/17/21 1955 09/18/21 0400 09/18/21 0455 09/18/21 0730  ?BP: (!) 104/54  124/74 122/88  ?Pulse: 94  (!) 105   ?Resp:   20 20  ?Temp: 97.8 ?F (36.6 ?C)  97.6 ?F (36.4 ?C)   ?TempSrc: Oral  Oral   ?SpO2: 97%  92% 98%  ?Weight:  70 kg    ?Height:      ? ? ?Intake/Output Summary (Last 24 hours) at 09/18/2021 1716 ?Last data filed at 09/18/2021 0900 ?Gross per 24 hour  ?Intake 759.36 ml  ?Output 650 ml  ?Net 109.36 ml  ? ? ?  09/18/2021  ?  4:00 AM 09/17/2021  ?  8:00 AM 09/17/2021  ? 12:01 AM  ?Last 3 Weights  ?Weight (lbs) 154 lb 5.2 oz 155 lb 10.3 oz 155 lb 10.3 oz  ?Weight (kg) 70 kg 70.6 kg 70.6 kg  ?   ? ?Telemetry  ?Overnight telemetry shows atrial fibrillation heart rate 70-90 bpm, which I personally reviewed.  ? ? ?Physical Exam  ? ?Vitals:  ? 09/17/21 1955 09/18/21 0400 09/18/21 0455 09/18/21 0730  ?BP: (!) 104/54  124/74 122/88  ?Pulse: 94  (!) 105   ?Resp:   20 20  ?Temp: 97.8 ?F (36.6 ?C)  97.6 ?F (36.4 ?C)   ?TempSrc: Oral  Oral   ?SpO2: 97%  92% 98%  ?Weight:  70 kg    ?Height:      ?  ?Intake/Output Summary (Last 24 hours) at 09/18/2021 1716 ?Last data filed at 09/18/2021 0900 ?Gross per 24 hour  ?Intake  759.36 ml  ?Output 650 ml  ?Net 109.36 ml  ?  ? ?  09/18/2021  ?  4:00 AM 09/17/2021  ?  8:00 AM 09/17/2021  ? 12:01 AM  ?Last 3 Weights  ?Weight (lbs) 154 lb 5.2 oz 155 lb 10.3 oz 155 lb 10.3 oz  ?Weight (kg) 70 kg 70.6 kg 70.6 kg  ?  Body mass index is 22.14 kg/m?.  ? ?General: Well nourished, well developed, in no acute distress ?Head: Atraumatic, normal size  ?Eyes: PEERLA, EOMI  ?Neck: Supple, no JVD ?Endocrine: No thryomegaly ?Cardiac: Normal S1, S2; irregular rhythm, 2 out of 6 systolic ejection murmur ?Lungs: Diminished breath sounds bilaterally ?Abd: Soft, nontender, no hepatomegaly  ?Ext: No edema, pulses 2+ ?Musculoskeletal: No deformities, BUE and BLE strength normal and equal ?Skin: Warm and dry, no rashes   ?Neuro: Alert and oriented to person, place, time, and situation, CNII-XII grossly intact, no focal deficits  ?Psych: Normal mood and affect  ? ?Labs  ?High Sensitivity Troponin:   ?Recent Labs  ?Lab 09/15/21 ?1822 09/15/21 ?2054  ?TROPONINIHS 11  14  ?   ?Cardiac EnzymesNo results for input(s): TROPONINI in the last 168 hours. No results for input(s): TROPIPOC in the last 168 hours.  ?Chemistry ?Recent Labs  ?Lab 09/15/21 ?1822 09/16/21 ?9242 09/17/21 ?0148 09/18/21 ?6834  ?NA 126* 129* 126* 126*  ?K 4.8 4.3 5.0 4.6  ?CL 86* 83* 82* 82*  ?CO2 32 39* 36* 35*  ?GLUCOSE 101* 87 86 141*  ?BUN 34* 31* 29* 32*  ?CREATININE 1.10 1.10 1.06 1.06  ?CALCIUM 8.9 8.9 8.5* 8.5*  ?PROT 5.2*  --   --   --   ?ALBUMIN 2.8*  --   --   --   ?AST 16  --   --   --   ?ALT 15  --   --   --   ?ALKPHOS 41  --   --   --   ?BILITOT 0.5  --   --   --   ?GFRNONAA >60 >60 >60 >60  ?ANIONGAP 8 7 8 9   ?  ?Hematology ?Recent Labs  ?Lab 09/16/21 ?0241 09/17/21 ?0148 09/18/21 ?1962  ?WBC 6.5 6.4 7.9  ?RBC 3.36* 3.53* 3.64*  ?HGB 8.6* 9.2* 9.2*  ?HCT 27.9* 29.7* 30.8*  ?MCV 83.0 84.1 84.6  ?MCH 25.6* 26.1 25.3*  ?MCHC 30.8 31.0 29.9*  ?RDW 15.0 14.9 14.5  ?PLT 184 194 194  ? ?BNP ?Recent Labs  ?Lab 09/15/21 ?1822  ?BNP 345.1*  ?  ?DDimer No  results for input(s): DDIMER in the last 168 hours.  ? ?Radiology  ?CT HEAD WO CONTRAST (5MM) ? ?Result Date: 09/18/2021 ?CLINICAL DATA:  Mental status change, unknown cause. EXAM: CT HEAD WITHOUT CONTRAST TECHNIQUE: Contiguous axial images were obtained from the base of the skull through the vertex without intravenous contrast. RADIATION DOSE REDUCTION: This exam was performed according to the departmental dose-optimization program which includes automated exposure control, adjustment of the mA and/or kV according to patient size and/or use of iterative reconstruction technique. COMPARISON:  None Available. FINDINGS: Brain: There is no evidence of an acute infarct, intracranial hemorrhage, mass, midline shift, or extra-axial fluid collection. Hypodensities in the cerebral white matter bilaterally are nonspecific but compatible with mild chronic small vessel ischemic disease. Mild cerebral atrophy is within normal limits for age. Vascular: Calcified atherosclerosis at the skull base. No hyperdense vessel. Skull: No fracture or suspicious osseous lesion. Sinuses/Orbits: Visualized paranasal sinuses and mastoid air cells are clear. Bilateral cataract extraction. Other: None. IMPRESSION: 1. No evidence of acute intracranial abnormality. 2. Mild chronic small vessel ischemic disease. Electronically Signed   By: Logan Bores M.D.   On: 09/18/2021 11:31   ? ?Cardiac Studies  ?TTE 01/09/2021 ? 1. Largely nondiagnostic study due to poor acoustic windows for imaging.  ?Limited findings as noted below.  ? 2. Left ventricular ejection fraction, by estimation, is 65 to 70%. The  ?left ventricle has normal function. Left ventricular diastolic parameters  ?are indeterminate.  ? 3. Large pleural effusion.  ? 4. The mitral valve is degenerative.  ? 5. The aortic valve is abnormal.  ? ?Patient Profile  ?88 year old male with history of persistent atrial fibrillation, aortic stenosis, hypertension, lung cancer status post radiation  therapy who was admitted 09/15/2021 with weakness and shortness of breath.  Found to have new onset anemia.  Colonoscopy today with rectal mass. ? ?Assessment & Plan  ? ?#Persistent atrial fibrillation ?#GI bleed ?#Rectal mass ?-Admitted with rectal mass.  Looks like this is rectal cancer.  Not a candidate for anticoagulation moving forward.  We will continue to hold this. ?-He is effectively rate controlled.  We will continue with current medications. ? ?#PVCs ?-Apparent conduction versus PVCs.  Really nothing bothersome to him.  Would recommend beta-blocker for now. ? ?#Moderate aortic stenosis ?-Exam still consistent with moderate AS. ? ?#Shortness of breath ?#Lower extremity edema ?#Hypoalbuminemia ?-Volume is acceptable.  Suspect this is multifactorial in setting of low albumin as well as now with malignancy. ?-Has right Pleurx catheter as well.  This is chronic. ?-Would recommend to continue home diuretic therapy.  He does not need IV diuresis. ?-Takes home Lasix 40 mg daily.  Okay to continue this at discharge. ?-Also okay to continue home Aldactone. ? ?[facility] will sign off.   ?Medication Recommendations: As above ?Other recommendations (labs, testing, etc): None. ?Follow up as an outpatient: He may keep regular hospital follow-up with Dr. Percival Spanish. ? ?For questions or updates, please contact [facility] ?Please consult [email or web address] for contact info under  ?   ?Signed, ?[place] T. Audie Box, MD, [facility] ?[place]  ?09/18/2021 5:16 PM  ? ?

## 2021-09-18 NOTE — Progress Notes (Signed)
Mobility Specialist Progress Note: ? ? 09/18/21 1300  ?Mobility  ?Activity Transferred to/from [facility]  ?Level of Assistance Minimal assist, patient does 75% or more  ?Assistive Device BSC  ?Distance Ambulated (ft) 2 ft  ?Activity Response Tolerated well  ?$Mobility charge 1 Mobility  ? ?Pt requesting to get to [facility] for BM. Required minA to stand from EOB, and pivot to [facility]. Pt left with call bell in reach, educated to call when done. Daughter also in room.  ? ?Nelta Numbers ?Acute Rehab ?Phone: [phone] ?Office Phone: [phone] ? ?

## 2021-09-18 NOTE — Progress Notes (Signed)
At 0740      this am pt was calling out, sitting up at foot of bed, O2 off, c/o SOB, and had pulled his IV out. Pt was not in any visible distress with his breathing, O2 placed back on, IV site cleaned up, got pt to [facility] to sit while he and the bed were cleaned from the blood of IV coming out. Pt oriented at this time. Assisted back to bed, offered to set up his breakfast, but did not want to eat. Instructed pt to call using call bell system if he wanted to eat later. Pt verbalized understanding.  ? ? ?At 0930,   pt's daughter here, pt sleeping, she woke him up, she states dad is not himself,  was concerned about him slurring his speech some, pt not fully awake at this time------she's wandering if he may have had a stroke,  discussed with her how the pt was this morning at 0740 (as noted in the above)  also informed her that pt had melatonin last night---woke up confused "didn't know where he was"  around 0400 per night RN report, but reoriented fine and pt went back to sleep per report.    ?Daughter reassured, and Dr Avon Gully notified of the above and requested for him to come to bedside to see the pt and daughter.  ? ? ? ?

## 2021-09-19 ENCOUNTER — Other Ambulatory Visit: Payer: Self-pay

## 2021-09-19 ENCOUNTER — Telehealth: Payer: Self-pay

## 2021-09-19 NOTE — Progress Notes (Signed)
I spoke with Adam Briggs's daughter, Adam Briggs and explained my role as a Art therapist and provided my contact information. ?I provided her with the date, time, and location for Adam Briggs's MRI.  I instructed her that he needs to complete a fleets enema at 0600 on 5/9 2 hours prior to his MRI.  I provided her with the date and time of his f/u appt with Dr Burr Medico.  I asked that they arrive 15 minutes prior to his appt time for registration purposes.  All questions were answered.  She verbalized understanding. ? ?

## 2021-09-19 NOTE — Telephone Encounter (Signed)
Pt's daughter called inquiring about f/u appt for pt after Dr. Burr Medico saw pt while admitted.  Pt's daughter stated Dr. Burr Medico told them when the pt was admitted that she would see the pt in clinic 2 to 3 days post the pt's discharge.  Spoke with Ihor Gully regarding new pt referral and Santiago Glad stated she will arrange for pt's MRI to be scheduled and have Karie Mainland to schedule the pt for his initial appt with Dr. Burr Medico.  Informed Pt's daughter of the plan and she verbalized understanding.  Instructed pt's daughter to contact Dr. Ernestina Penna office if she has not heard from [place] w/in the next week.  Pt's daughter verbalized understanding. ?

## 2021-09-20 ENCOUNTER — Telehealth: Payer: Self-pay | Admitting: Internal Medicine

## 2021-09-20 ENCOUNTER — Telehealth: Payer: Self-pay | Admitting: Gastroenterology

## 2021-09-20 DIAGNOSIS — J9611 Chronic respiratory failure with hypoxia: Secondary | ICD-10-CM | POA: Diagnosis not present

## 2021-09-20 DIAGNOSIS — Z4803 Encounter for change or removal of drains: Secondary | ICD-10-CM | POA: Diagnosis not present

## 2021-09-20 DIAGNOSIS — J90 Pleural effusion, not elsewhere classified: Secondary | ICD-10-CM | POA: Diagnosis not present

## 2021-09-20 DIAGNOSIS — Z48813 Encounter for surgical aftercare following surgery on the respiratory system: Secondary | ICD-10-CM | POA: Diagnosis not present

## 2021-09-20 DIAGNOSIS — J9811 Atelectasis: Secondary | ICD-10-CM | POA: Diagnosis not present

## 2021-09-20 DIAGNOSIS — Z4801 Encounter for change or removal of surgical wound dressing: Secondary | ICD-10-CM | POA: Diagnosis not present

## 2021-09-20 NOTE — Telephone Encounter (Signed)
Returned call to Lauree Chandler, RN with [facility]. I informed her that the patient's Eliquis was held by the hospitalist at the time of admission and cardiology recently met with the patient and confirmed that he is no longer a candidate for anticoagulation and they will continue to hold Eliquis. Melanie verbalized understanding and had no concerns at the end of the call. ?

## 2021-09-20 NOTE — Telephone Encounter (Signed)
Pt daughter called in and states pt was d/c from hospital but is still weak and wants to know if he needs the scheduled appointment.  ? ?Reports that [place] will be coming in this week and doing blood work because he lost so much blood while in hospital.  ? ?Please call to follow up  ?

## 2021-09-20 NOTE — Telephone Encounter (Signed)
We received a call from [facility] Nurse) regarding blood thinners. Threasa Beards states that patient told her that Dr. Havery Moros told him that bt were stopped due to GI bleed. Threasa Beards would like to know when can this patient get back on blood thinners? Please advise. ? ?Threasa Beards # [phone] ?

## 2021-09-21 DIAGNOSIS — J9611 Chronic respiratory failure with hypoxia: Secondary | ICD-10-CM | POA: Diagnosis not present

## 2021-09-21 DIAGNOSIS — Z4803 Encounter for change or removal of drains: Secondary | ICD-10-CM | POA: Diagnosis not present

## 2021-09-21 DIAGNOSIS — Q6100 Congenital renal cyst, unspecified: Secondary | ICD-10-CM | POA: Diagnosis not present

## 2021-09-21 DIAGNOSIS — Z4801 Encounter for change or removal of surgical wound dressing: Secondary | ICD-10-CM | POA: Diagnosis not present

## 2021-09-21 DIAGNOSIS — J90 Pleural effusion, not elsewhere classified: Secondary | ICD-10-CM | POA: Diagnosis not present

## 2021-09-21 DIAGNOSIS — J9811 Atelectasis: Secondary | ICD-10-CM | POA: Diagnosis not present

## 2021-09-21 DIAGNOSIS — Z8546 Personal history of malignant neoplasm of prostate: Secondary | ICD-10-CM | POA: Diagnosis not present

## 2021-09-21 DIAGNOSIS — Z48813 Encounter for surgical aftercare following surgery on the respiratory system: Secondary | ICD-10-CM | POA: Diagnosis not present

## 2021-09-21 DIAGNOSIS — N400 Enlarged prostate without lower urinary tract symptoms: Secondary | ICD-10-CM | POA: Diagnosis not present

## 2021-09-21 NOTE — Telephone Encounter (Signed)
If he wants [place] to be able to supervise home health we would need a visit which can be in person or video (not audio). ?

## 2021-09-24 ENCOUNTER — Telehealth: Payer: Self-pay | Admitting: Internal Medicine

## 2021-09-24 DIAGNOSIS — Z4803 Encounter for change or removal of drains: Secondary | ICD-10-CM | POA: Diagnosis not present

## 2021-09-24 DIAGNOSIS — J90 Pleural effusion, not elsewhere classified: Secondary | ICD-10-CM | POA: Diagnosis not present

## 2021-09-24 DIAGNOSIS — Z4801 Encounter for change or removal of surgical wound dressing: Secondary | ICD-10-CM | POA: Diagnosis not present

## 2021-09-24 DIAGNOSIS — J9611 Chronic respiratory failure with hypoxia: Secondary | ICD-10-CM | POA: Diagnosis not present

## 2021-09-24 DIAGNOSIS — J9811 Atelectasis: Secondary | ICD-10-CM | POA: Diagnosis not present

## 2021-09-24 DIAGNOSIS — Z48813 Encounter for surgical aftercare following surgery on the respiratory system: Secondary | ICD-10-CM | POA: Diagnosis not present

## 2021-09-24 NOTE — Telephone Encounter (Signed)
Patient needs verbal order signed from 09/03/2020 - Please call Mariann Laster at [place] ?

## 2021-09-24 NOTE — Telephone Encounter (Signed)
Pt daughter called back in and sttes pt's kidneys may be failing as his legs are swelling and leaking fluid.  ?

## 2021-09-25 ENCOUNTER — Ambulatory Visit (HOSPITAL_COMMUNITY)
Admission: RE | Admit: 2021-09-25 | Discharge: 2021-09-25 | Disposition: A | Discharge status: Home / Self Care | Payer: Medicare Other | Source: Ambulatory Visit | Attending: Hematology | Admitting: Hematology

## 2021-09-25 ENCOUNTER — Emergency Department (HOSPITAL_COMMUNITY): Payer: Medicare Other

## 2021-09-25 ENCOUNTER — Inpatient Hospital Stay (HOSPITAL_COMMUNITY)
Admission: EM | Admit: 2021-09-25 | Discharge: 2021-10-18 | DRG: 871 | Disposition: E | Payer: Medicare Other | Attending: Internal Medicine | Admitting: Internal Medicine

## 2021-09-25 ENCOUNTER — Other Ambulatory Visit: Payer: Self-pay

## 2021-09-25 ENCOUNTER — Encounter (HOSPITAL_COMMUNITY): Payer: Self-pay

## 2021-09-25 DIAGNOSIS — R0902 Hypoxemia: Secondary | ICD-10-CM | POA: Diagnosis not present

## 2021-09-25 DIAGNOSIS — E875 Hyperkalemia: Secondary | ICD-10-CM | POA: Diagnosis not present

## 2021-09-25 DIAGNOSIS — Y838 Other surgical procedures as the cause of abnormal reaction of the patient, or of later complication, without mention of misadventure at the time of the procedure: Secondary | ICD-10-CM | POA: Diagnosis not present

## 2021-09-25 DIAGNOSIS — Z923 Personal history of irradiation: Secondary | ICD-10-CM

## 2021-09-25 DIAGNOSIS — R531 Weakness: Secondary | ICD-10-CM | POA: Diagnosis not present

## 2021-09-25 DIAGNOSIS — E43 Unspecified severe protein-calorie malnutrition: Secondary | ICD-10-CM | POA: Diagnosis not present

## 2021-09-25 DIAGNOSIS — E1169 Type 2 diabetes mellitus with other specified complication: Secondary | ICD-10-CM | POA: Diagnosis present

## 2021-09-25 DIAGNOSIS — I350 Nonrheumatic aortic (valve) stenosis: Secondary | ICD-10-CM

## 2021-09-25 DIAGNOSIS — R918 Other nonspecific abnormal finding of lung field: Secondary | ICD-10-CM | POA: Insufficient documentation

## 2021-09-25 DIAGNOSIS — Z9981 Dependence on supplemental oxygen: Secondary | ICD-10-CM

## 2021-09-25 DIAGNOSIS — T85698A Other mechanical complication of other specified internal prosthetic devices, implants and grafts, initial encounter: Secondary | ICD-10-CM | POA: Diagnosis not present

## 2021-09-25 DIAGNOSIS — J9601 Acute respiratory failure with hypoxia: Secondary | ICD-10-CM | POA: Insufficient documentation

## 2021-09-25 DIAGNOSIS — Z8546 Personal history of malignant neoplasm of prostate: Secondary | ICD-10-CM

## 2021-09-25 DIAGNOSIS — Z66 Do not resuscitate: Secondary | ICD-10-CM | POA: Diagnosis present

## 2021-09-25 DIAGNOSIS — N179 Acute kidney failure, unspecified: Secondary | ICD-10-CM | POA: Diagnosis present

## 2021-09-25 DIAGNOSIS — J939 Pneumothorax, unspecified: Secondary | ICD-10-CM | POA: Diagnosis present

## 2021-09-25 DIAGNOSIS — I4891 Unspecified atrial fibrillation: Secondary | ICD-10-CM | POA: Diagnosis present

## 2021-09-25 DIAGNOSIS — G479 Sleep disorder, unspecified: Secondary | ICD-10-CM | POA: Diagnosis present

## 2021-09-25 DIAGNOSIS — I509 Heart failure, unspecified: Secondary | ICD-10-CM | POA: Diagnosis not present

## 2021-09-25 DIAGNOSIS — J948 Other specified pleural conditions: Secondary | ICD-10-CM | POA: Diagnosis not present

## 2021-09-25 DIAGNOSIS — K7469 Other cirrhosis of liver: Secondary | ICD-10-CM | POA: Diagnosis present

## 2021-09-25 DIAGNOSIS — J9621 Acute and chronic respiratory failure with hypoxia: Secondary | ICD-10-CM | POA: Diagnosis not present

## 2021-09-25 DIAGNOSIS — N400 Enlarged prostate without lower urinary tract symptoms: Secondary | ICD-10-CM | POA: Diagnosis present

## 2021-09-25 DIAGNOSIS — I10 Essential (primary) hypertension: Secondary | ICD-10-CM | POA: Diagnosis present

## 2021-09-25 DIAGNOSIS — E7800 Pure hypercholesterolemia, unspecified: Secondary | ICD-10-CM | POA: Diagnosis not present

## 2021-09-25 DIAGNOSIS — Z79899 Other long term (current) drug therapy: Secondary | ICD-10-CM

## 2021-09-25 DIAGNOSIS — C20 Malignant neoplasm of rectum: Secondary | ICD-10-CM

## 2021-09-25 DIAGNOSIS — R0602 Shortness of breath: Secondary | ICD-10-CM | POA: Diagnosis not present

## 2021-09-25 DIAGNOSIS — E8770 Fluid overload, unspecified: Secondary | ICD-10-CM | POA: Diagnosis present

## 2021-09-25 DIAGNOSIS — J9622 Acute and chronic respiratory failure with hypercapnia: Secondary | ICD-10-CM | POA: Diagnosis present

## 2021-09-25 DIAGNOSIS — R609 Edema, unspecified: Secondary | ICD-10-CM | POA: Diagnosis present

## 2021-09-25 DIAGNOSIS — J189 Pneumonia, unspecified organism: Secondary | ICD-10-CM | POA: Diagnosis present

## 2021-09-25 DIAGNOSIS — K219 Gastro-esophageal reflux disease without esophagitis: Secondary | ICD-10-CM | POA: Diagnosis present

## 2021-09-25 DIAGNOSIS — Z7189 Other specified counseling: Secondary | ICD-10-CM | POA: Diagnosis not present

## 2021-09-25 DIAGNOSIS — R6520 Severe sepsis without septic shock: Secondary | ICD-10-CM | POA: Diagnosis present

## 2021-09-25 DIAGNOSIS — Z87891 Personal history of nicotine dependence: Secondary | ICD-10-CM

## 2021-09-25 DIAGNOSIS — E440 Moderate protein-calorie malnutrition: Secondary | ICD-10-CM | POA: Diagnosis present

## 2021-09-25 DIAGNOSIS — E871 Hypo-osmolality and hyponatremia: Secondary | ICD-10-CM | POA: Diagnosis present

## 2021-09-25 DIAGNOSIS — Z515 Encounter for palliative care: Secondary | ICD-10-CM | POA: Diagnosis not present

## 2021-09-25 DIAGNOSIS — A419 Sepsis, unspecified organism: Principal | ICD-10-CM | POA: Diagnosis present

## 2021-09-25 DIAGNOSIS — J90 Pleural effusion, not elsewhere classified: Secondary | ICD-10-CM | POA: Diagnosis not present

## 2021-09-25 DIAGNOSIS — I4821 Permanent atrial fibrillation: Secondary | ICD-10-CM | POA: Diagnosis not present

## 2021-09-25 DIAGNOSIS — Z85118 Personal history of other malignant neoplasm of bronchus and lung: Secondary | ICD-10-CM | POA: Diagnosis not present

## 2021-09-25 DIAGNOSIS — E1165 Type 2 diabetes mellitus with hyperglycemia: Secondary | ICD-10-CM | POA: Diagnosis present

## 2021-09-25 DIAGNOSIS — N433 Hydrocele, unspecified: Secondary | ICD-10-CM | POA: Diagnosis not present

## 2021-09-25 DIAGNOSIS — C3412 Malignant neoplasm of upper lobe, left bronchus or lung: Secondary | ICD-10-CM | POA: Diagnosis present

## 2021-09-25 DIAGNOSIS — Z8249 Family history of ischemic heart disease and other diseases of the circulatory system: Secondary | ICD-10-CM

## 2021-09-25 DIAGNOSIS — E8809 Other disorders of plasma-protein metabolism, not elsewhere classified: Secondary | ICD-10-CM | POA: Diagnosis present

## 2021-09-25 DIAGNOSIS — Z888 Allergy status to other drugs, medicaments and biological substances status: Secondary | ICD-10-CM

## 2021-09-25 DIAGNOSIS — I083 Combined rheumatic disorders of mitral, aortic and tricuspid valves: Secondary | ICD-10-CM | POA: Diagnosis present

## 2021-09-25 DIAGNOSIS — Z20822 Contact with and (suspected) exposure to covid-19: Secondary | ICD-10-CM | POA: Diagnosis not present

## 2021-09-25 DIAGNOSIS — Z6823 Body mass index (BMI) 23.0-23.9, adult: Secondary | ICD-10-CM

## 2021-09-25 DIAGNOSIS — D490 Neoplasm of unspecified behavior of digestive system: Secondary | ICD-10-CM | POA: Diagnosis not present

## 2021-09-25 DIAGNOSIS — J9811 Atelectasis: Secondary | ICD-10-CM | POA: Diagnosis not present

## 2021-09-25 DIAGNOSIS — R5381 Other malaise: Secondary | ICD-10-CM | POA: Diagnosis present

## 2021-09-25 DIAGNOSIS — D500 Iron deficiency anemia secondary to blood loss (chronic): Secondary | ICD-10-CM | POA: Insufficient documentation

## 2021-09-25 LAB — CBC
HCT: 30.1 % — ABNORMAL LOW (ref 39.0–52.0)
Hemoglobin: 9.2 g/dL — ABNORMAL LOW (ref 13.0–17.0)
MCH: 26.8 pg (ref 26.0–34.0)
MCHC: 30.6 g/dL (ref 30.0–36.0)
MCV: 87.8 fL (ref 80.0–100.0)
Platelets: 234 10*3/uL (ref 150–400)
RBC: 3.43 MIL/uL — ABNORMAL LOW (ref 4.22–5.81)
RDW: 16.5 % — ABNORMAL HIGH (ref 11.5–15.5)
WBC: 14.9 10*3/uL — ABNORMAL HIGH (ref 4.0–10.5)
nRBC: 0.1 % (ref 0.0–0.2)

## 2021-09-25 LAB — BRAIN NATRIURETIC PEPTIDE: B Natriuretic Peptide: 572.7 pg/mL — ABNORMAL HIGH (ref 0.0–100.0)

## 2021-09-25 LAB — CBG MONITORING, ED
Glucose-Capillary: 160 mg/dL — ABNORMAL HIGH (ref 70–99)
Glucose-Capillary: 141 mg/dL — ABNORMAL HIGH (ref 70–99)

## 2021-09-25 LAB — BASIC METABOLIC PANEL
Anion gap: 10 (ref 5–15)
BUN: 49 mg/dL — ABNORMAL HIGH (ref 8–23)
CO2: 33 mmol/L — ABNORMAL HIGH (ref 22–32)
Calcium: 8.4 mg/dL — ABNORMAL LOW (ref 8.9–10.3)
Chloride: 86 mmol/L — ABNORMAL LOW (ref 98–111)
Creatinine, Ser: 0.99 mg/dL (ref 0.61–1.24)
GFR, Estimated: >60 mL/min (ref >60)
Glucose, Bld: 147 mg/dL — ABNORMAL HIGH (ref 70–99)
Potassium: 5.1 mmol/L (ref 3.5–5.1)
Sodium: 129 mmol/L — ABNORMAL LOW (ref 135–145)

## 2021-09-25 LAB — HEPATIC FUNCTION PANEL
ALT: 22 U/L (ref 0–44)
AST: 15 U/L (ref 15–41)
Albumin: 2.8 g/dL — ABNORMAL LOW (ref 3.5–5.0)
Alkaline Phosphatase: 58 U/L (ref 38–126)
Bilirubin, Direct: 0.3 mg/dL — ABNORMAL HIGH (ref 0.0–0.2)
Indirect Bilirubin: 0.5 mg/dL (ref 0.3–0.9)
Total Bilirubin: 0.8 mg/dL (ref 0.3–1.2)
Total Protein: 6 g/dL — ABNORMAL LOW (ref 6.5–8.1)

## 2021-09-25 LAB — URINALYSIS, ROUTINE W REFLEX MICROSCOPIC
Bilirubin Urine: NEGATIVE
Glucose, UA: NEGATIVE mg/dL
Hgb urine dipstick: NEGATIVE
Ketones, ur: NEGATIVE mg/dL
Leukocytes,Ua: NEGATIVE
Nitrite: NEGATIVE
Protein, ur: NEGATIVE mg/dL
Specific Gravity, Urine: 1.009 (ref 1.005–1.030)
pH: 5 (ref 5.0–8.0)

## 2021-09-25 LAB — TROPONIN I (HIGH SENSITIVITY)
Troponin I (High Sensitivity): 14 ng/L (ref <18)
Troponin I (High Sensitivity): 13 ng/L (ref <18)

## 2021-09-25 LAB — PROTIME-INR
INR: 1.1 (ref 0.8–1.2)
Prothrombin Time: 14.1 seconds (ref 11.4–15.2)

## 2021-09-25 LAB — LACTIC ACID, PLASMA: Lactic Acid, Venous: 1 mmol/L (ref 0.5–1.9)

## 2021-09-25 MED ORDER — TAMSULOSIN HCL 0.4 MG PO CAPS
0.4000 mg | ORAL_CAPSULE | Freq: Every day | ORAL | Status: DC
  Administered 2021-09-25 – 2021-09-26 (×2): 0.4 mg via ORAL
  Filled 2021-09-25 (×2): qty 1

## 2021-09-25 MED ORDER — ONDANSETRON HCL 4 MG PO TABS
4.0000 mg | ORAL_TABLET | Freq: Four times a day (QID) | ORAL | Status: DC | PRN
Start: 2021-09-25 — End: 2021-09-28

## 2021-09-25 MED ORDER — VANCOMYCIN HCL 1500 MG/300ML IV SOLN
1500.0000 mg | Freq: Once | INTRAVENOUS | Status: AC
  Administered 2021-09-25: 1500 mg via INTRAVENOUS
  Filled 2021-09-25: qty 300

## 2021-09-25 MED ORDER — LEVALBUTEROL HCL 1.25 MG/0.5ML IN NEBU
1.2500 mg | INHALATION_SOLUTION | Freq: Four times a day (QID) | RESPIRATORY_TRACT | Status: DC
  Administered 2021-09-25 – 2021-09-27 (×8): 1.25 mg via RESPIRATORY_TRACT
  Filled 2021-09-25 (×8): qty 0.5

## 2021-09-25 MED ORDER — POLYETHYLENE GLYCOL 3350 17 G PO PACK: 17.0000 g | PACK | Freq: Every day | ORAL | Status: DC | PRN

## 2021-09-25 MED ORDER — PANTOPRAZOLE SODIUM 40 MG PO TBEC
40.0000 mg | DELAYED_RELEASE_TABLET | Freq: Two times a day (BID) | ORAL | Status: DC
  Administered 2021-09-25 – 2021-09-26 (×3): 40 mg via ORAL
  Filled 2021-09-25 (×3): qty 1

## 2021-09-25 MED ORDER — FUROSEMIDE 40 MG PO TABS: 40.0000 mg | ORAL_TABLET | Freq: Every day | ORAL | Status: DC

## 2021-09-25 MED ORDER — SIMVASTATIN 20 MG PO TABS
20.0000 mg | ORAL_TABLET | Freq: Every day | ORAL | Status: DC
Start: 2021-09-25 — End: 2021-09-26
  Administered 2021-09-25 – 2021-09-26 (×2): 20 mg via ORAL
  Filled 2021-09-25 (×2): qty 1

## 2021-09-25 MED ORDER — SENNOSIDES-DOCUSATE SODIUM 8.6-50 MG PO TABS
1.0000 | ORAL_TABLET | Freq: Two times a day (BID) | ORAL | Status: DC
  Administered 2021-09-25 – 2021-09-26 (×3): 1 via ORAL
  Filled 2021-09-25 (×3): qty 1

## 2021-09-25 MED ORDER — PIPERACILLIN-TAZOBACTAM 3.375 G IVPB 30 MIN
3.3750 g | Freq: Once | INTRAVENOUS | Status: AC
  Administered 2021-09-25: 3.375 g via INTRAVENOUS
  Filled 2021-09-25: qty 50

## 2021-09-25 MED ORDER — FUROSEMIDE 10 MG/ML IJ SOLN
20.0000 mg | Freq: Every day | INTRAMUSCULAR | Status: DC
  Administered 2021-09-26: 20 mg via INTRAVENOUS
  Filled 2021-09-25: qty 4

## 2021-09-25 MED ORDER — VANCOMYCIN HCL 1250 MG/250ML IV SOLN
1250.0000 mg | INTRAVENOUS | Status: DC
  Filled 2021-09-25: qty 250

## 2021-09-25 MED ORDER — LABETALOL HCL 5 MG/ML IV SOLN
10.0000 mg | Freq: Once | INTRAVENOUS | Status: AC
  Administered 2021-09-25: 10 mg via INTRAVENOUS
  Filled 2021-09-25: qty 4

## 2021-09-25 MED ORDER — METOPROLOL TARTRATE 25 MG PO TABS
25.0000 mg | ORAL_TABLET | Freq: Two times a day (BID) | ORAL | Status: DC
  Administered 2021-09-25 – 2021-09-26 (×3): 25 mg via ORAL
  Filled 2021-09-25 (×3): qty 1

## 2021-09-25 MED ORDER — ONDANSETRON HCL 4 MG/2ML IJ SOLN: 4.0000 mg | Freq: Four times a day (QID) | INTRAMUSCULAR | Status: DC | PRN

## 2021-09-25 MED ORDER — ACETAMINOPHEN 325 MG PO TABS: 650.0000 mg | ORAL_TABLET | Freq: Four times a day (QID) | ORAL | Status: DC | PRN

## 2021-09-25 MED ORDER — SODIUM CHLORIDE 0.9 % IV SOLN
2.0000 g | Freq: Two times a day (BID) | INTRAVENOUS | Status: DC
  Administered 2021-09-25 – 2021-09-27 (×4): 2 g via INTRAVENOUS
  Filled 2021-09-25 (×4): qty 12.5

## 2021-09-25 MED ORDER — ACETAMINOPHEN 650 MG RE SUPP: 650.0000 mg | Freq: Four times a day (QID) | RECTAL | Status: DC | PRN

## 2021-09-25 MED ORDER — INSULIN ASPART 100 UNIT/ML IJ SOLN
0.0000 [IU] | Freq: Three times a day (TID) | INTRAMUSCULAR | Status: DC
  Administered 2021-09-25: 1 [IU] via SUBCUTANEOUS
  Administered 2021-09-26 (×2): 2 [IU] via SUBCUTANEOUS
  Administered 2021-09-27: 3 [IU] via SUBCUTANEOUS
  Filled 2021-09-25: qty 0.09

## 2021-09-25 MED ORDER — FUROSEMIDE 10 MG/ML IJ SOLN: 20.0000 mg | Freq: Every day | INTRAMUSCULAR | Status: DC

## 2021-09-25 MED ORDER — SPIRONOLACTONE 25 MG PO TABS
50.0000 mg | ORAL_TABLET | Freq: Every day | ORAL | Status: DC
  Administered 2021-09-25 – 2021-09-26 (×2): 50 mg via ORAL
  Filled 2021-09-25 (×2): qty 2

## 2021-09-25 MED ORDER — FUROSEMIDE 10 MG/ML IJ SOLN
60.0000 mg | Freq: Once | INTRAMUSCULAR | Status: AC
  Administered 2021-09-25: 60 mg via INTRAVENOUS
  Filled 2021-09-25: qty 8

## 2021-09-25 NOTE — ED Notes (Signed)
Pt tachypneic and oxygen saturations in the upper 80's. Pt oxygen increased to 4L [place] with improvement to 90%. Respirations elevated. Primary RN notified.  ?

## 2021-09-25 NOTE — Progress Notes (Signed)
Pharmacy Antibiotic Note ? ?Adam Briggs is a 88 y.o. male admitted on 10/12/2021 with pneumonia.  Pharmacy has been consulted for vancomycin dosing. ?5/9 CXR: new L lung interstitial edema; new L lung base opacity, loculated R pleural effusion ?Plan: ?Vancomycin 1500 mg IV x 1 followed by vancomycin 1250 mg IV q24 for est AUC 549.3 using Scr 0.99, TBW (TBW> IBW) & Vd 0.5 ?Cefepime 2 gm IV q12h ?F/u renal function, WBC, temp, culture data ?Vancomycin levels as needed ?F/u MRSA PCR ?  ? ?Temp (24hrs), Avg:97.6 ?F (36.4 ?C), Min:97.4 ?F (36.3 ?C), Max:97.8 ?F (36.6 ?C) ? ?Recent Labs  ?Lab 10/01/2021 ?1009 10/06/2021 ?1527  ?WBC 14.9*  --   ?CREATININE 0.99  --   ?LATICACIDVEN  --  1.0  ?  ?Estimated Creatinine Clearance: 49.1 mL/min (by C-G formula based on SCr of 0.99 mg/dL).   ? ?Allergies  ?Allergen Reactions  ? Lisinopril Rash  ?Antimicrobials this admission:  ?5/9 vanc >> ?5/9 zosyn x 1 ?5/9 cefepime>> ?Dose adjustments this admission:  ? ?Microbiology results:  ?5/9 BCx2:  ?5/9 MRSA PCR:  ? ? ?Thank you for allowing pharmacy to be a part of this patient?s care. ? ?Eudelia Bunch, Pharm.D ?10/07/2021 6:24 PM ? ?

## 2021-09-25 NOTE — ED Triage Notes (Signed)
Pts daughter reports increased SHOB and generalized weakness over the past few days. Pts daughter also reports increased swelling in legs and now his hands. Pt is on 3L O2 at baseline. Pts daughter also reports noticing that he has not been urinating much and his urine is dark and malodorous.  ?

## 2021-09-25 NOTE — H&P (Signed)
?History and Physical  ? ? ?Patient: Adam Briggs DOB: 1929-09-27 ?DOA: 10/08/2021 ?DOS: the patient was seen and examined on 09/20/2021 ?PCP: [name], MD  ?Patient coming from: Home ? ?Chief Complaint:  ?Chief Complaint  ?Patient presents with  ? Shortness of Breath  ? Weakness  ? ?HPI: Adam Briggs is a 88 y.o. male with medical history significant of persistent atrial fibrillation, BPH, cataract, colon diverticulosis, type II DM, GERD, moderate aortic stenosis, hyperlipidemia, hypertension, lung cancer, recurrent right pleural effusion with Pleurx catheter that is draining daily who was recently admitted and discharged is coming to the emergency department with worsening generalized weakness and dyspnea for the past 5 days from [facility] due to GI bleed secondary to adenocarcinoma while on anticoagulation for atrial fibrillation who is returning to the hospital a week after discharge due to worsening dyspnea, generalized weakness, decreased appetite, difficulty sleeping and overall worsening physical conditioning.  History is mostly given by the patient's daughter and his son, with the patient occasionally providing some information.  We had a discussion about current overall condition, prognosis and goals of care.  They would like to do everything possible within reason for treatment, but do not wish to have the patient undergo CPR, endotracheal intubation and mechanical ventilation in case of cardiorespiratory arrest. ? ?ED course: Initial vital signs were temperature 97.8 ?F, heart rate 106, respiratory rate 27, BP 144/70 mmHg and O2 sat 91% on nasal cannula oxygen 2 LPM.  The patient received furosemide 60 mg IVP, labetalol 10 mg IVP Zosyn and vancomycin. ? ?Lab work: Urinalysis was normal.  CBC showed a white count of 14.9, hemoglobin 9.2 g/dL platelets 234.  PT and INR were normal.  Lactic acid was 1.0 mmol/L.  Troponin x214 and 13 ng/L respectively.  BNP 572.7 pg/mL.  Sodium 129,  potassium 5.1, chloride 86 and CO2 33 mmol/L.  Glucose 147, BUN 49 creatinine 0.99 mg/dL.  Calcium normalizes when corrected.  LFTs showing a total protein 6.0 and albumin 2.8 g/dL.  Direct bilirubin is 0.3 mg/dL.  The rest of the LFTs were normal. ? ?Imaging: Portable chest radiograph shows mild increase in loculated right pleural effusion.  There is new left lung interstitial edema.  There is new opacity within the left lung base, which could be atelectasis or airspace disease.  MRI of the pelvis W0 CM CA staging showing a rectal tumor that extended from distal rectum into the sphincter complex but has as far as the anal orifice and involves internal sphincter and intersphincteric plane.  Tumor also extends beyond the muscularis propria just above the sphincter complex.  No signs of adenopathy.  There is a cellulitis in the pelvis presumably related to heart failure or volume overload. ?  ?Review of Systems: As mentioned in the history of present illness. All other systems reviewed and are negative. ?Past Medical History:  ?Diagnosis Date  ? Atrial fibrillation ([place])   ? BENIGN PROSTATIC HYPERTROPHY   ? Cataract   ? DIVERTICULOSIS, COLON   ? DM   ? not diabetic-has been off metformin for about 2 years as of 09/2020 pr daughter  ? GERD   ? Heart murmur   ? Hernia   ? HYPERCHOLESTEROLEMIA   ? HYPERTENSION   ? Lung cancer ([place])   ? Moderate aortic stenosis 10/30/2011  ? echo 2013  ? Prostate cancer [facility])   ? s/p seed implant  ? Recurrent right pleural effusion   ? ?Past Surgical History:  ?Procedure Laterality  Date  ? BIOPSY  09/17/2021  ? Procedure: BIOPSY;  Surgeon: Yetta Flock, MD;  Location: [place];  Service: Gastroenterology;;  ? BRONCHIAL BIOPSY  10/17/2020  ? Procedure: BRONCHIAL BIOPSIES;  Surgeon: Garner Nash, DO;  Location: [place] [facility];  Service: Pulmonary;;  ? BRONCHIAL BRUSHINGS  10/17/2020  ? Procedure: BRONCHIAL BRUSHINGS;  Surgeon: Garner Nash, DO;  Location: [place];   Service: Pulmonary;;  ? BRONCHIAL NEEDLE ASPIRATION BIOPSY  10/17/2020  ? Procedure: BRONCHIAL NEEDLE ASPIRATION BIOPSIES;  Surgeon: Garner Nash, DO;  Location: [place];  Service: Pulmonary;;  ? BRONCHIAL WASHINGS  10/17/2020  ? Procedure: BRONCHIAL WASHINGS;  Surgeon: Garner Nash, DO;  Location: [place] [facility];  Service: Pulmonary;;  ? CATARACT EXTRACTION W/ INTRAOCULAR LENS IMPLANT  02/2013  ? R, then L 4 weeks later - [place]  ? CHEST TUBE INSERTION N/A 08/08/2021  ? Procedure: INSERTION PLEURAL DRAINAGE CATHETER;  Surgeon: Garner Nash, DO;  Location: [place] [facility];  Service: Pulmonary;  Laterality: N/A;  ? FIDUCIAL MARKER PLACEMENT  10/17/2020  ? Procedure: FIDUCIAL MARKER PLACEMENT;  Surgeon: Garner Nash, DO;  Location: [place];  Service: Pulmonary;;  ? FLEXIBLE SIGMOIDOSCOPY N/A 09/17/2021  ? Procedure: FLEXIBLE SIGMOIDOSCOPY;  Surgeon: Yetta Flock, MD;  Location: [place];  Service: Gastroenterology;  Laterality: N/A;  ? INGUINAL HERNIA REPAIR    ? Right  ? INGUINAL HERNIA REPAIR Left 07/15/2014  ? Procedure: LEFT INGUINAL HERNIA REPAIR WITH MESH;  Surgeon: Armandina Gemma, MD;  Location: [facility];  Service: General;  Laterality: Left;  ? INSERTION OF MESH Left 07/15/2014  ? Procedure: INSERTION OF MESH;  Surgeon: Armandina Gemma, MD;  Location: [facility];  Service: General;  Laterality: Left;  ? RADIOACTIVE SEED IMPLANT    ? THORACENTESIS N/A 01/05/2021  ? Procedure: THORACENTESIS;  Surgeon: Candee Furbish, MD;  Location: [facility];  Service: Pulmonary;  Laterality: N/A;  ? VIDEO BRONCHOSCOPY WITH ENDOBRONCHIAL NAVIGATION Left 10/17/2020  ? Procedure: VIDEO BRONCHOSCOPY WITH ENDOBRONCHIAL NAVIGATION;  Surgeon: Garner Nash, DO;  Location: [place];  Service: Pulmonary;  Laterality: Left;  ? VIDEO BRONCHOSCOPY WITH ENDOBRONCHIAL ULTRASOUND Bilateral 10/17/2020  ? Procedure: VIDEO BRONCHOSCOPY WITH ENDOBRONCHIAL ULTRASOUND;  Surgeon: Garner Nash, DO;  Location: [place];   Service: Pulmonary;  Laterality: Bilateral;  ? ?Social History:  reports that he quit smoking about 54 years ago. His smoking use included cigarettes. He has a 17.50 pack-year smoking history. He has never used smokeless tobacco. He reports that he does not drink alcohol and does not use drugs. ? ?Allergies  ?Allergen Reactions  ? Lisinopril Rash  ? ? ?Family History  ?Problem Relation Age of Onset  ? Heart attack Paternal Uncle   ?     in his 51s  ? Atrial fibrillation Daughter   ? Colon cancer Neg Hx   ? ? ?Prior to Admission medications   ?Medication Sig Start Date End Date Taking? Authorizing Provider  ?Calcium Carbonate-Vitamin D 600-10 MG-MCG TABS Take 0.5 tablets by mouth daily with breakfast.   Yes [provider]  ?fluticasone (FLONASE) 50 MCG/ACT nasal spray Place 2 sprays into both nostrils daily. ?Patient taking differently: Place 2 sprays into both nostrils daily as needed for allergies. 08/04/18  Yes [name], MD  ?furosemide (LASIX) 40 MG tablet Take 40 mg by mouth daily.   Yes [provider]  ?levalbuterol (XOPENEX HFA) 45 MCG/ACT inhaler Inhale 1 puff into the lungs every 4 (four) hours as needed  for wheezing or shortness of breath. 01/17/21 01/17/22 Yes [name], MD  ?metoprolol tartrate (LOPRESSOR) 25 MG tablet Take 1 tablet (25 mg total) by mouth 2 (two) times daily. 05/10/21  Yes [name], MD  ?Multiple Vitamin (MULTIVITAMIN WITH MINERALS) TABS tablet Take 1 tablet by mouth daily.   Yes [provider]  ?OXYGEN Inhale 3 L into the lungs continuous.   Yes [provider]  ?pantoprazole (PROTONIX) 40 MG tablet Take 1 tablet (40 mg total) by mouth 2 (two) times daily. 09/18/21  Yes Little Ishikawa, MD  ?Polyethylene Glycol 3350 (MIRALAX PO) Take 17 g by mouth daily as needed (constipation.).   Yes [provider]  ?senna-docusate (SENOKOT-S) 8.6-50 MG tablet Take 1 tablet by mouth at bedtime as needed for mild  constipation or moderate constipation. ?Patient taking differently: Take 1 tablet by mouth in the morning and at bedtime. 05/10/21  Yes [name], MD  ?simvastatin (ZOCOR) 20 MG tablet Take 1 tablet (20 mg

## 2021-09-25 NOTE — Telephone Encounter (Signed)
Pt has an appt on 09/26/2021 at 10:00 am with Dr. Sharlet Salina  ?

## 2021-09-25 NOTE — Progress Notes (Signed)
A consult was received from an ED physician for vancomycin & zosyn per pharmacy dosing.  The patient's profile has been reviewed for ht/wt/allergies/indication/available labs.   ?A one time order has been placed for vancomycin 1500 mg & zosyn 3.357 gm IV x 1 dose.   ? ?Further antibiotics/pharmacy consults should be ordered by admitting physician if indicated.       ?                ?Thank you, ? ?Eudelia Bunch, Pharm.D ?10/13/2021 2:44 PM ? ?

## 2021-09-25 NOTE — ED Provider Notes (Signed)
?[facility] DEPT ?Provider Note ? ? ?CSN: [id number] ?Arrival date & time: 10/14/2021  0908 ? ?  ? ?History ? ?Chief Complaint  ?Patient presents with  ? Shortness of Breath  ? Weakness  ? ? ?Adam Briggs is a 88 y.o. male. ? ?HPI ? ?88 year old male with past medical history of nonsmall cell lung carcinoma with recurrent right pleural effusion/Pleurx in place, chronic hypoxia on 2 L nasal cannula, HTN, HLD, DM, atrial fibrillation no longer on anticoagulation secondary to a newly discovered rectal mass (diagnosis adenocarcinoma pending oncology evaluation for treatment) presents emergency department with worsening weakness and shortness of breath.  Patient is from home.  Daughter drains his Pleurx catheter daily.  Admits to decreased/minimal drainage over the last 3 days.  Patient feels weak and short of breath with worsening swelling of his feet and hands.  No recent fever, endorses decreased urine output. ? ?Home Medications ?Prior to Admission medications   ?Medication Sig Start Date End Date Taking? Authorizing Provider  ?Calcium Carbonate-Vitamin D 600-10 MG-MCG TABS Take 0.5 tablets by mouth daily with breakfast.    [provider]  ?fluticasone (FLONASE) 50 MCG/ACT nasal spray Place 2 sprays into both nostrils daily. ?Patient taking differently: Place 2 sprays into both nostrils daily as needed for allergies. 08/04/18   [name], MD  ?furosemide (LASIX) 40 MG tablet Take 40 mg by mouth See admin instructions. Marshia Ly    [provider]  ?levalbuterol Penne Lash HFA) 45 MCG/ACT inhaler Inhale 1 puff into the lungs every 4 (four) hours as needed for wheezing or shortness of breath. 01/17/21 01/17/22  [name], MD  ?Lidocaine, Anorectal, 5 % CREA Place 1 application. rectally daily as needed (hemorrhoids).    [provider]  ?metoprolol tartrate (LOPRESSOR) 25 MG tablet Take 1 tablet (25 mg total) by mouth 2 (two) times  daily. 05/10/21   [name], MD  ?Multiple Vitamin (MULTIVITAMIN WITH MINERALS) TABS tablet Take 1 tablet by mouth in the morning. Centrum Silver for Men 50+    [provider]  ?OXYGEN Inhale 2.5 L into the lungs continuous.    [provider]  ?pantoprazole (PROTONIX) 40 MG tablet Take 1 tablet (40 mg total) by mouth 2 (two) times daily. 09/18/21   Little Ishikawa, MD  ?Polyethylene Glycol 3350 (MIRALAX PO) Take 17 g by mouth daily as needed (constipation.).    [provider]  ?senna-docusate (SENOKOT-S) 8.6-50 MG tablet Take 1 tablet by mouth at bedtime as needed for mild constipation or moderate constipation. 05/10/21   [name], MD  ?shark liver oil-cocoa butter (PREPARATION H) 0.25-88.44 % suppository Place 1 suppository rectally as needed for hemorrhoids.    [provider]  ?simvastatin (ZOCOR) 20 MG tablet Take 1 tablet (20 mg total) by mouth daily at 6 PM. 11/01/20   [name], MD  ?spironolactone (ALDACTONE) 50 MG tablet TAKE 1 TABLET BY MOUTH EVERY DAY 09/17/21   [name], MD  ?tamsulosin (FLOMAX) 0.4 MG CAPS capsule Take 0.4 mg by mouth in the morning. 07/21/20   [provider]  ?   ? ?Allergies    ?Lisinopril   ? ?Review of Systems   ?Review of Systems  ?Constitutional:  Positive for chills and fatigue. Negative for fever.  ?Respiratory:  Positive for cough and shortness of breath.   ?Cardiovascular:  Positive for leg swelling. Negative for chest pain.  ?Gastrointestinal:  Negative for abdominal pain, diarrhea and vomiting.  ?  Skin:  Negative for rash.  ?Neurological:  Negative for headaches.  ? ?Physical Exam ?Updated Vital Signs ?BP 114/71 (BP Location: Right Arm)   Pulse (!) 107   Temp 97.8 ?F (36.6 ?C) (Oral)   Resp (!) 32   SpO2 91%  ?Physical Exam ?Vitals and nursing note reviewed.  ?Constitutional:   ?   General: He is not in acute distress. ?   Appearance: Normal appearance. He is not diaphoretic.   ?HENT:  ?   Head: Normocephalic.  ?   Mouth/Throat:  ?   Mouth: Mucous membranes are moist.  ?Cardiovascular:  ?   Rate and Rhythm: Normal rate.  ?Pulmonary:  ?   Effort: Tachypnea present.  ?   Breath sounds: Examination of the right-upper field reveals decreased breath sounds. Examination of the right-middle field reveals decreased breath sounds. Examination of the right-lower field reveals decreased breath sounds. Decreased breath sounds and rales present.  ?   Comments: Decreased breath sounds all of right lung field, scattered rales in the left lung field ?Abdominal:  ?   Palpations: Abdomen is soft.  ?   Tenderness: There is no abdominal tenderness.  ?Musculoskeletal:  ?   Right lower leg: Edema present.  ?   Left lower leg: Edema present.  ?   Comments: Bilateral hand edema  ?Skin: ?   General: Skin is warm.  ?Neurological:  ?   Mental Status: He is alert and oriented to person, place, and time. Mental status is at baseline.  ?Psychiatric:     ?   Mood and Affect: Mood normal.  ? ? ?ED Results / Procedures / Treatments   ?Labs ?(all labs ordered are listed, but only abnormal results are displayed) ?Labs Reviewed  ?BASIC METABOLIC PANEL - Abnormal; Notable for the following components:  ?    Result Value  ? Sodium 129 (*)   ? Chloride 86 (*)   ? CO2 33 (*)   ? Glucose, Bld 147 (*)   ? BUN 49 (*)   ? Calcium 8.4 (*)   ? All other components within normal limits  ?CBC - Abnormal; Notable for the following components:  ? WBC 14.9 (*)   ? RBC 3.43 (*)   ? Hemoglobin 9.2 (*)   ? HCT 30.1 (*)   ? RDW 16.5 (*)   ? All other components within normal limits  ?HEPATIC FUNCTION PANEL - Abnormal; Notable for the following components:  ? Total Protein 6.0 (*)   ? Albumin 2.8 (*)   ? Bilirubin, Direct 0.3 (*)   ? All other components within normal limits  ?BRAIN NATRIURETIC PEPTIDE - Abnormal; Notable for the following components:  ? B Natriuretic Peptide 572.7 (*)   ? All other components within normal limits  ?CBG  MONITORING, ED - Abnormal; Notable for the following components:  ? Glucose-Capillary 160 (*)   ? All other components within normal limits  ?CULTURE, BLOOD (ROUTINE X 2)  ?CULTURE, BLOOD (ROUTINE X 2)  ?URINALYSIS, ROUTINE W REFLEX MICROSCOPIC  ?PROTIME-INR  ?LACTIC ACID, PLASMA  ?LACTIC ACID, PLASMA  ?TROPONIN I (HIGH SENSITIVITY)  ?TROPONIN I (HIGH SENSITIVITY)  ? ? ?EKG ?EKG Interpretation ? ?Date/Time:  Tuesday Sep 25 2021 09:25:10 EDT ?Ventricular Rate:  90 ?PR Interval:    ?QRS Duration: 104 ?QT Interval:  298 ?QTC Calculation: 365 ?R Axis:   76 ?Text Interpretation: Atrial fibrillation Nonspecific T abnormalities, inferior leads Confirmed by Lavenia Atlas [phone]) on 10/15/2021 1:36:05 PM ? ?Radiology ?DG Chest [facility]  1 View ? ?Result Date: 09/26/2021 ?CLINICAL DATA:  Shortness of breath. EXAM: PORTABLE CHEST 1 VIEW COMPARISON:  09/15/2021 FINDINGS: Right-sided chest tube is stable compared with the previous exam. Moderate loculated right pleural effusion is again noted in appears mildly increased in volume from the previous exam. Increase interstitial markings are identified within the left lung concerning for mild interstitial edema. Opacity within the left lung base is new and may represent atelectasis or airspace disease. IMPRESSION: 1. Mild increase in volume of loculated right pleural effusion. 2. New left lung interstitial edema. 3. New opacity within the left lung base, this may represent atelectasis or airspace disease. Electronically Signed   By: Kerby Moors M.D.   On: 09/20/2021 10:07  ? ?MR PELVIS WO CM RECTAL CA STAGING ? ?Result Date: 09/22/2021 ?CLINICAL DATA:  88 year old with a history of lung cancer presents with new diagnosis of tumor in the inter rectal region. EXAM: MRI PELVIS WITHOUT CONTRAST TECHNIQUE: Multiplanar multisequence MR imaging of the pelvis was performed. No intravenous contrast was administered. Ultrasound gel was administered per rectum to optimize tumor evaluation.  COMPARISON:  Abdominal and pelvic CT from September 15, 2021. FINDINGS: TUMOR LOCATION Tumor distance from Anal Verge/Skin Surface: Tumor may extend to the level of the anal orifice. Tumor distance to Internal Anal Sphincter:

## 2021-09-26 ENCOUNTER — Other Ambulatory Visit: Payer: Self-pay

## 2021-09-26 ENCOUNTER — Telehealth: Payer: Self-pay | Admitting: Internal Medicine

## 2021-09-26 ENCOUNTER — Inpatient Hospital Stay (HOSPITAL_COMMUNITY): Payer: Medicare Other

## 2021-09-26 DIAGNOSIS — J90 Pleural effusion, not elsewhere classified: Secondary | ICD-10-CM

## 2021-09-26 DIAGNOSIS — J189 Pneumonia, unspecified organism: Secondary | ICD-10-CM

## 2021-09-26 DIAGNOSIS — I509 Heart failure, unspecified: Secondary | ICD-10-CM

## 2021-09-26 DIAGNOSIS — J9621 Acute and chronic respiratory failure with hypoxia: Secondary | ICD-10-CM | POA: Diagnosis not present

## 2021-09-26 DIAGNOSIS — E43 Unspecified severe protein-calorie malnutrition: Secondary | ICD-10-CM | POA: Diagnosis not present

## 2021-09-26 DIAGNOSIS — A419 Sepsis, unspecified organism: Secondary | ICD-10-CM

## 2021-09-26 DIAGNOSIS — C20 Malignant neoplasm of rectum: Secondary | ICD-10-CM

## 2021-09-26 DIAGNOSIS — C3412 Malignant neoplasm of upper lobe, left bronchus or lung: Secondary | ICD-10-CM | POA: Diagnosis not present

## 2021-09-26 DIAGNOSIS — E871 Hypo-osmolality and hyponatremia: Secondary | ICD-10-CM

## 2021-09-26 DIAGNOSIS — R0602 Shortness of breath: Secondary | ICD-10-CM | POA: Diagnosis not present

## 2021-09-26 DIAGNOSIS — E875 Hyperkalemia: Secondary | ICD-10-CM

## 2021-09-26 DIAGNOSIS — E440 Moderate protein-calorie malnutrition: Secondary | ICD-10-CM

## 2021-09-26 LAB — CBC
HCT: 31 % — ABNORMAL LOW (ref 39.0–52.0)
Hemoglobin: 9 g/dL — ABNORMAL LOW (ref 13.0–17.0)
MCH: 26.1 pg (ref 26.0–34.0)
MCHC: 29 g/dL — ABNORMAL LOW (ref 30.0–36.0)
MCV: 89.9 fL (ref 80.0–100.0)
Platelets: 232 10*3/uL (ref 150–400)
RBC: 3.45 MIL/uL — ABNORMAL LOW (ref 4.22–5.81)
RDW: 16.4 % — ABNORMAL HIGH (ref 11.5–15.5)
WBC: 12.7 10*3/uL — ABNORMAL HIGH (ref 4.0–10.5)
nRBC: 0.3 % — ABNORMAL HIGH (ref 0.0–0.2)

## 2021-09-26 LAB — PROCALCITONIN: Procalcitonin: <0.1 ng/mL

## 2021-09-26 LAB — GLUCOSE, CAPILLARY
Glucose-Capillary: 152 mg/dL — ABNORMAL HIGH (ref 70–99)
Glucose-Capillary: 220 mg/dL — ABNORMAL HIGH (ref 70–99)
Glucose-Capillary: 188 mg/dL — ABNORMAL HIGH (ref 70–99)
Glucose-Capillary: 155 mg/dL — ABNORMAL HIGH (ref 70–99)

## 2021-09-26 LAB — COMPREHENSIVE METABOLIC PANEL
ALT: 20 U/L (ref 0–44)
AST: 15 U/L (ref 15–41)
Albumin: 2.7 g/dL — ABNORMAL LOW (ref 3.5–5.0)
Alkaline Phosphatase: 51 U/L (ref 38–126)
Anion gap: 6 (ref 5–15)
BUN: 51 mg/dL — ABNORMAL HIGH (ref 8–23)
CO2: 38 mmol/L — ABNORMAL HIGH (ref 22–32)
Calcium: 8.4 mg/dL — ABNORMAL LOW (ref 8.9–10.3)
Chloride: 88 mmol/L — ABNORMAL LOW (ref 98–111)
Creatinine, Ser: 1.05 mg/dL (ref 0.61–1.24)
GFR, Estimated: >60 mL/min (ref >60)
Glucose, Bld: 135 mg/dL — ABNORMAL HIGH (ref 70–99)
Potassium: 5.2 mmol/L — ABNORMAL HIGH (ref 3.5–5.1)
Sodium: 132 mmol/L — ABNORMAL LOW (ref 135–145)
Total Bilirubin: 0.6 mg/dL (ref 0.3–1.2)
Total Protein: 5.8 g/dL — ABNORMAL LOW (ref 6.5–8.1)

## 2021-09-26 LAB — HEMOGLOBIN A1C
Hgb A1c MFr Bld: 5.8 % — ABNORMAL HIGH (ref 4.8–5.6)
Mean Plasma Glucose: 119.76 mg/dL

## 2021-09-26 LAB — EXPECTORATED SPUTUM ASSESSMENT W GRAM STAIN, RFLX TO RESP C

## 2021-09-26 LAB — ECHOCARDIOGRAM COMPLETE
AR max vel: 0.94 cm2
AV Area VTI: 0.89 cm2
AV Area mean vel: 1.06 cm2
AV Mean grad: 37 mmHg
AV Peak grad: 68.7 mmHg
Ao pk vel: 4.15 m/s
MV VTI: 2.62 cm2
S' Lateral: 3.2 cm

## 2021-09-26 LAB — STREP PNEUMONIAE URINARY ANTIGEN: Strep Pneumo Urinary Antigen: NEGATIVE

## 2021-09-26 LAB — MRSA NEXT GEN BY PCR, NASAL: MRSA by PCR Next Gen: NOT DETECTED

## 2021-09-26 LAB — LACTATE DEHYDROGENASE: LDH: 115 U/L (ref 98–192)

## 2021-09-26 LAB — PROTEIN, TOTAL: Total Protein: 6.5 g/dL (ref 6.5–8.1)

## 2021-09-26 MED ORDER — FUROSEMIDE 10 MG/ML IJ SOLN
40.0000 mg | Freq: Once | INTRAMUSCULAR | Status: AC
  Administered 2021-09-26: 40 mg via INTRAVENOUS
  Filled 2021-09-26: qty 4

## 2021-09-26 MED ORDER — SODIUM CHLORIDE (PF) 0.9 % IJ SOLN
10.0000 mg | Freq: Once | INTRAMUSCULAR | Status: DC
  Filled 2021-09-26: qty 10

## 2021-09-26 MED ORDER — SODIUM CHLORIDE 0.9% FLUSH
10.0000 mL | Freq: Three times a day (TID) | INTRAVENOUS | Status: DC
  Administered 2021-09-26 (×2): 10 mL

## 2021-09-26 MED ORDER — SODIUM CHLORIDE 0.9% FLUSH
10.0000 mL | Freq: Three times a day (TID) | INTRAVENOUS | Status: DC
  Administered 2021-09-27 (×3): 10 mL

## 2021-09-26 MED ORDER — FUROSEMIDE 10 MG/ML IJ SOLN
40.0000 mg | Freq: Every day | INTRAMUSCULAR | Status: DC
  Administered 2021-09-27: 40 mg via INTRAVENOUS
  Filled 2021-09-26: qty 4

## 2021-09-26 MED ORDER — ALBUMIN HUMAN 25 % IV SOLN
25.0000 g | Freq: Four times a day (QID) | INTRAVENOUS | Status: DC
  Administered 2021-09-26 – 2021-09-27 (×5): 25 g via INTRAVENOUS
  Filled 2021-09-26 (×6): qty 100

## 2021-09-26 MED ORDER — STERILE WATER FOR INJECTION IJ SOLN
5.0000 mg | Freq: Once | RESPIRATORY_TRACT | Status: DC
  Filled 2021-09-26: qty 5

## 2021-09-26 MED ORDER — SODIUM CHLORIDE 3 % IN NEBU
4.0000 mL | INHALATION_SOLUTION | Freq: Two times a day (BID) | RESPIRATORY_TRACT | Status: DC
Start: 2021-09-26 — End: 2021-09-27
  Administered 2021-09-26 – 2021-09-27 (×2): 4 mL via RESPIRATORY_TRACT
  Filled 2021-09-26 (×4): qty 4

## 2021-09-26 MED ORDER — SODIUM CHLORIDE 0.9% FLUSH
10.0000 mL | Freq: Three times a day (TID) | INTRAVENOUS | Status: DC
  Administered 2021-09-26 – 2021-09-27 (×2): 10 mL

## 2021-09-26 NOTE — Assessment & Plan Note (Addendum)
-   Increased volume involving moderate loculated right pleural effusion.  Pleurx catheter has been placed March 2023 ?- Multiple samples taken from bronchoscopies and thoracenteses.  No malignant cells identified.  Cytology notes atypical cells with lymphocyte predominance ?- greatly appreciate pulmonology involvement  ?- continue Pleurx, management per pulmonology  ?-Continue Lasix ?-no fluid studies sent from pleurx currently  ?

## 2021-09-26 NOTE — Progress Notes (Signed)
PleurX clamped at 1553 after tpa and dornase received by MD Loanne Drilling. PleurX also flushed with 40mL of NS. Pt tolerated all well. ? ?Repositioned on his right side after clamped. ? ?Repositioned on his left side at 1820. ? ?Repositioned on his back at [place]. ? ?Unclamped at [place]. MD Loanne Drilling at bedside and added low wall suction.  ?

## 2021-09-26 NOTE — Consult Note (Signed)
? ?NAME:  Adam Briggs, MRN:  [id number], DOB:  04/11/1930, LOS: 1 ?ADMISSION DATE:  10/13/2021, CONSULTATION DATE:  09/26/21 ?REFERRING MD:  Girguis CHIEF COMPLAINT:  Dyspnea  ? ?History of Present Illness:  ?Adam Briggs is a 88 y.o. male who has a PMH as outlined below including a known chronic right-sided loculated effusion with history of ex vacuo pneumothorax.  He is followed by Dr. [name] and had a right-sided Pleurx catheter placed on 08/08/2021 due to recurrent effusions despite multiple thoracentesis and pigtail catheter placement.  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  His fluid analysis from previous thoracenteses have all been lymphocyte predominant. ? ?Since he had the Pleurx catheter placed 08/08/2021, family has been draining it every day.  That daughter claims that they drained anywhere from 100 to 150 cc/day.  He had been doing fairly well up until 09/15/2021 when he required admission for dyspnea and GI bleed secondary to newly discovered rectal mass with biopsy consistent with adenocarcinoma.  He was discharged on/2/23.  He had been doing all right at home but had ongoing dyspnea along with cough.  Family noted that ever since his discharge, his drainage from his Pleurx catheter had dropped down to around 75 cc/day.  His last drainage was on Monday, 09/24/2021. ? ?On 10/08/2021, he presented to [facility] long ED with dyspnea, cough, generalized weakness.  Chest x-ray revealed slight increase in size of loculated right pleural effusion along with probable left-sided lower lobe infiltrate.  He was started on vancomycin and cefepime and cultures were obtained.  He was admitted by the hospitalist service for ongoing management. ? ?On 09/26/2021, PCCM asked to weigh in given his slight increase in the  known right-sided loculated pleural effusion. ? ? ?Pertinent  Medical History:  ?has Type 2 diabetes mellitus with hyperlipidemia ([place]); Hyperlipidemia associated with type 2 diabetes mellitus ([place]); Essential hypertension; Abnormal ECG; Moderate aortic stenosis; Inguinal hernia; Routine general medical examination at a health care facility; Wheezing; SOB (shortness of breath); Pleural effusion, right; Acute hypoxemic respiratory failure ([place]); Atrial fibrillation, new onset ([place]); Atrial fibrillation ([place]); S/P thoracentesis; Recurrent right pleural effusion; Malignant neoplasm of upper lobe of left lung ([place]); Leg swelling; Loculated right lateral pneumothorax; Pleural effusion, left; Liver lesion; Other cirrhosis of liver ([place]); Pressure injury of skin; Rash; Acute on chronic respiratory failure with hypoxia ([place]); Recurrent pleural effusion; GI bleed; ABLA (acute blood loss anemia); Hyponatremia; Rectal bleeding; Rectal mass; Rectal cancer ([place]); Acute respiratory failure with hypoxia ([place]); Chronic blood loss anemia; Protein-calorie malnutrition, moderate ([place]); Volume overload; and Left lower lobe pulmonary infiltrate on their problem list. ? ?Significant Hospital Events: ?Including procedures, antibiotic start and stop dates in addition to other pertinent events   ?5/9 admit. ?5/10 PCCM consult.  Right-sided Pleurx catheter to be placed to 20 cm suction. ? ?Interim History / Subjective:  ?Fairly comfortable, eating.  Currently no dyspnea or cough.  Daughter at bedside states that drainage has been down to around 75 cc ever since he was admitted and discharged back home on 10/08/2021 (previously it was anywhere up to 150 cc/day).  His last drainage was on Monday, 09/24/2021. ? ?Objective:  ?Blood pressure 116/60, pulse 100, temperature 98.8 ?F (37.1 ?C), temperature source Oral, resp. rate 16, SpO2 96 %. ?   ?   ? ?  Intake/Output Summary (Last 24 hours) at 09/26/2021 1132 ?Last data filed at 09/17/2021 2345 ?Gross  per 24 hour  ?Intake 500 ml  ?Output --  ?Net 500 ml  ? ?There were no vitals filed for this visit. ? ?Examination: ?General: Adult male, chronically ill-appearing, resting in bed, in NAD. ?Neuro: A&O x 3, no deficits. ?HEENT: [place]/AT. Sclerae anicteric. EOMI. ?Cardiovascular: IRIR, no M/R/G.  ?Lungs: Respirations even and unlabored.  Diminished bilaterally R > L. ?Abdomen: BS x 4, soft, NT/ND.  ?Musculoskeletal: No gross deformities, no edema.  ?Skin: Intact, warm, no rashes. ? ?Labs/imaging personally reviewed:  ?CXR 5/9 > slight increase R loculated effusion, L basilar infiltrate, mild edema. ? ?Assessment & Plan:  ? ?Known chronic right loculated effusion/hydropneumothorax - s/p Pleurx catheter 08/09/21. Slightly worse this admit and per family, has had a drop in drainage over past week or so ever since last admission to hospital. ?Probable HCAP. ?Acute on chronic hypoxic respiratory failure - 2/2 above. ?- RN asked to place pleurx to 20cm suction via pleuravac/Sahara. ?- Monitor output and f/u CXR in AM, if no improvement then can trial intrapleural tPA/DNAse. ?- Will send fluid for repeat analysis of cytology, cell count, culture, LDH, protein. ?- Continue empiric Cefepime. Agree ok to hold Vanc for now. ?- Continue supplemental O2 as needed to maintain SpO2 > 92%. ?- Bronchial hygiene. ?- Continue hypertonic nebs. ?- Mobilize as able. ? ?Rest per primary team. PCCM will follow for effusion/chest tube. ? ? ?Best practice (evaluated daily):  ?Per primary team. ? ?Labs   ?CBC: ?Recent Labs  ?Lab 09/29/2021 ?1009 09/26/21 ?0432  ?WBC 14.9* 12.7*  ?HGB 9.2* 9.0*  ?HCT 30.1* 31.0*  ?MCV 87.8 89.9  ?PLT 234 232  ? ? ?Basic Metabolic Panel: ?Recent Labs  ?Lab 09/30/2021 ?1009 09/26/21 ?0432  ?NA 129* 132*  ?K 5.1 5.2*  ?CL 86* 88*  ?CO2 33* 38*  ?GLUCOSE 147* 135*  ?BUN 49* 51*  ?CREATININE 0.99 1.05  ?CALCIUM 8.4* 8.4*  ? ?GFR: ?Estimated Creatinine Clearance: 46.3 mL/min (by C-G formula based on SCr of 1.05  mg/dL). ?Recent Labs  ?Lab 09/17/2021 ?1009 09/30/2021 ?1527 09/26/21 ?0432  ?PROCALCITON  --   --  <0.10  ?WBC 14.9*  --  12.7*  ?LATICACIDVEN  --  1.0  --   ? ? ?Liver Function Tests: ?Recent Labs  ?Lab 10/12/2021 ?1009 09/26/21 ?0432  ?AST 15 15  ?ALT 22 20  ?ALKPHOS 58 51  ?BILITOT 0.8 0.6  ?PROT 6.0* 5.8*  ?ALBUMIN 2.8* 2.7*  ? ?No results for input(s): LIPASE, AMYLASE in the last 168 hours. ?No results for input(s): AMMONIA in the last 168 hours. ? ?ABG ?   ?Component Value Date/Time  ? HCO3 32.4 (H) 09/15/2020 1556  ? O2SAT 76.2 09/15/2020 1556  ?  ? ?Coagulation Profile: ?Recent Labs  ?Lab 10/03/2021 ?1009  ?INR 1.1  ? ? ?Cardiac Enzymes: ?No results for input(s): CKTOTAL, CKMB, CKMBINDEX, TROPONINI in the last 168 hours. ? ?HbA1C: ?Hgb A1c MFr Bld  ?Date/Time Value Ref Range Status  ?09/26/2021 04:33 AM 5.8 (H) 4.8 - 5.6 % Final  ?  Comment:  ?  (NOTE) ?Pre diabetes:          5.7%-6.4% ? ?Diabetes:              >6.4% ? ?Glycemic control for   <7.0% ?adults with diabetes ?  ?09/15/2020 11:32 AM 6.2 4.6 - 6.5 % Final  ?  Comment:  ?  Glycemic Control Guidelines for  People with Diabetes:Non Diabetic:  <6%Goal of Therapy: <7%Additional Action Suggested:  >8%   ? ? ?CBG: ?Recent Labs  ?Lab 10/08/2021 ?0935 10/07/2021 ?1901  ?GLUCAP 160* 141*  ? ? ?Review of Systems:   ?All negative; except for those that are bolded, which indicate positives. ? ?Constitutional: weight loss, weight gain, night sweats, fevers, chills, fatigue, weakness.  ?HEENT: headaches, sore throat, sneezing, nasal congestion, post nasal drip, difficulty swallowing, tooth/dental problems, visual complaints, visual changes, ear aches. ?Neuro: difficulty with speech, weakness, numbness, ataxia. ?CV:  chest pain, orthopnea, PND, swelling in lower extremities, dizziness, palpitations, syncope.  ?Resp: cough, hemoptysis, dyspnea, wheezing. ?GI: heartburn, indigestion, abdominal pain, nausea, vomiting, diarrhea, constipation, change in bowel habits, loss of  appetite, hematemesis, melena, hematochezia.  ?GU: dysuria, change in color of urine, urgency or frequency, flank pain, hematuria. ?MSK: joint pain or swelling, decreased range of motion. ?Psych: change in mood or affect, de

## 2021-09-26 NOTE — ED Notes (Signed)
Rounded on pt. Pt currently denies any complaints and did not need any further assistance at this time.  ?

## 2021-09-26 NOTE — Assessment & Plan Note (Addendum)
-   tachycardia, tachypnea, leukocytosis.  CXR concerning for increased volume of loculated right pleural effusion as well as new opacity involving left lung base which could still be atelectasis versus infiltrate ?-MRSA screen negative, discontinue vancomycin ?- Continue cefepime ?- Hold off on anaerobic coverage as low concern for aspiration at this time ?-Trend procalcitonin ?

## 2021-09-26 NOTE — Progress Notes (Signed)
MD order pleurx to 20cm suction using sahara. ? ?ICU RN Pamala Hurry and IR Tye [place] assisted in attaching and ensuring function.  ? ?Pt tolerated all well. ? ?Monitor output. ?

## 2021-09-26 NOTE — Progress Notes (Signed)
?Progress Note ? ? ? ?Adam Briggs   ?QVZ:[id number]  ?DOB: [birth year]  ?DOA: 09/24/2021     1 ?PCP: [name], MD ? ?Initial CC: SOB, swelling ? 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  Other history includes prostate cancer 2009 s/p radioactive seeds and now most recently concern for rectal cancer s/p partial colonoscopy with pathology showing intramucosal adenocarcinoma from 09/17/2021 specimen. ?He has been evaluated by oncology with plans for possible radiation treatment and potentially Xeloda.  He was not considered a good candidate for chemotherapy. ? ?He presented with essentially worsening functional status at home with associated weakness/debility and worsening shortness of breath along with worsening swelling in his extremities.  He is accompanied by his daughter to the hospital.  Goals of care have been discussed on admission and family is insistent on remaining aggressive with care however does agree to DNR/DNI.  They are also not ready for evaluation with palliative care and declined consultation with them during hospitalization.  Pulmonology has also been consulted given increased appearance of loculated right-sided pleural effusion on CXR. ? ?Interval History:  ?Seen in the ER this morning with daughter present bedside.  Goals of care discussion held also.  At this time they are declining palliative care involvement and wishing for aggressive medical measures but are amenable with DNR/DNI. ?Patient was able to answer some questions and was not in any distress. ? ?Assessment and Plan: ?* Acute on chronic respiratory failure with hypoxia (HCC) ?- Increased oxygen demand on admission, presumed from underlying worsening right-sided effusion and possible superimposed  pneumonia ?-continue oxygen and wean as able ?- Continue Lasix ? ?Sepsis due to pneumonia ([place]) ?- tachycardia, tachypnea, leukocytosis.  CXR concerning for increased volume of loculated right pleural effusion as well as new opacity involving left lung base which could still be atelectasis versus infiltrate ?-MRSA screen negative, discontinue vancomycin ?- Continue cefepime ?- Hold off on anaerobic coverage as low concern for aspiration at this time ?-Trend procalcitonin (initial negative) ? ?Pleural effusion, right ?- Increased volume involving moderate loculated right pleural effusion.  Pleurx catheter has been placed March 2023 ?- Multiple samples taken from bronchoscopies and thoracenteses.  No malignant cells identified.  Cytology notes atypical cells with lymphocyte predominance ?-Pulmonology consulted for further assistance ?-Continue Lasix ?-Follow-up pleural fluid studies ? ?Edema ?- Patient has developed worsening edema in his arms and legs per family.  Overall, his nutrition has been poor at home and his hypoalbuminemia was discussed bedside with his daughter.  This is likely a consequence to his overall declining state however they wish to still remain medically aggressive; they have declined palliative care involvement at this time ?-Continue Lasix.  Start albumin to help with third spacing ?- Monitor strict I&O ? ?Rectal adenocarcinoma (Adam Briggs) ?- Was recently seen by oncology with tentative plans for radiation and possibly even Xeloda depending on his functional status which has now unfortunately declined ?- Intramucosal adenocarcinoma noted on surgical pathology from 09/17/2021 of rectal mass biopsy ?- Overall clinical picture has been taken into consideration and currently he is not thriving well and has poor functional status; I do not think he'd withstand treatment or aggressive measures in general, but further discussion can be held with oncology after discharge; family declining palliative care  involvement at this time ?-  guarded prognosis in general ? ?Malignant neoplasm of upper lobe of left lung (HCC) ?- presumed LUL stage 1a NSCLC s/p SBRT (completed 11/21/20 showing atypical cells on cytology) and ongoing recurrent/residual right pleural effusion (with negative cytology despite multiple samples since 2022) ?-CT chest from 06/23/2021 shows interval decrease (but still present) left apical pulmonary nodules (no liver lesions noted on follow up MRI abdomen) ? ?Hyperkalemia ?- K 5.2 this morning ?- discontinue spironolactone  ?- already on lasix ?- follow up repeat K on serial labs ? ?Hyponatremia ?- in setting of underlying malignancy and poor oral intake ?- hold off on IVF in setting of almost anasarca appearance ?- continue albumin and lasix ?- BMP in am  ? ?Protein-calorie malnutrition, moderate ([place]) ?- Ongoing hypoalbuminemia likely from his ongoing poor nutritional status ?-Check prealbumin ?-Dietitian consult requested ? ?Atrial fibrillation (Adam Briggs) ?- continue lopressor ?- will clarify if on eliquis  ? ?Essential hypertension ?- Hold home medications for now in setting of presumed sepsis ?- Okay for Lopressor ?-Continue Lasix ? ?Type 2 diabetes mellitus with hyperlipidemia (Adam Briggs) ?- continue SSI  ? ? ? ?Old records reviewed in assessment of this patient ? ?Antimicrobials: ?Vancomycin 09/21/2021 x 1 ?Zosyn 09/20/2021 x 1 ?Cefepime 10/03/2021 >> current ? ?DVT prophylaxis:  ?SCDs Start: 09/19/2021 1750 ? ? ?Code Status:   Code Status: DNR ? ?Disposition Plan: Pending clinical course ?Status is: Inpatient ? ?Objective: ?Blood pressure (!) 112/46, pulse (!) 102, temperature 97.8 ?F (36.6 ?C), temperature source Oral, resp. rate 16, height 5\' 10"  (1.778 m), weight 73.3 kg, SpO2 94 %.  ?Examination:  ?Physical Exam ?Constitutional:   ?   Appearance: Normal appearance.  ?   Comments: Fatigued appearing and chronically ill-appearing elderly gentleman resting in bed in no distress  ?HENT:  ?   Head: Normocephalic and  atraumatic.  ?   Mouth/Throat:  ?   Mouth: Mucous membranes are moist.  ?Eyes:  ?   Extraocular Movements: Extraocular movements intact.  ?Cardiovascular:  ?   Rate and Rhythm: Normal rate and regular rhythm.  ?   Heart sounds: Normal heart sounds.  ?Pulmonary:  ?   Effort: No respiratory distress.  ?   Breath sounds: No wheezing.  ?   Comments: Significantly decreased breath sounds in all right-sided lung fields.  No significant wheezing appreciated in left lung fields and breaths are shallow ?Abdominal:  ?   General: Bowel sounds are normal. There is no distension.  ?   Palpations: Abdomen is soft.  ?   Tenderness: There is no abdominal tenderness.  ?Musculoskeletal:  ?   Cervical back: Normal range of motion and neck supple.  ?   Comments: 3++ pitting edema in B/L LE and 2+ edema in RUE. 1+ edema in LUE  ?Skin: ?   General: Skin is warm and dry.  ?Neurological:  ?   General: No focal deficit present.  ?Psychiatric:     ?   Mood and Affect: Mood normal.     ?   Behavior: Behavior normal.  ?  ? ?Consultants:  ?Pulmonology ? ?Procedures:  ? ? ?Data Reviewed: ?Results for orders placed or performed during the hospital encounter of 09/26/2021 (from the past 24 hour(s))  ?CBG monitoring, ED     Status: Abnormal  ? Collection Time: 10/14/2021  7:01 PM  ?Result Value Ref Range  ? Glucose-Capillary 141 (H) 70 - 99 mg/dL  ? Comment 1 Notify RN   ? Comment 2 Document in Chart   ?Expectorated Sputum  Assessment w Gram Stain, Rflx to Resp Cult     Status: None  ? Collection Time: 09/26/21  1:00 AM  ? Specimen: Sputum  ?Result Value Ref Range  ? Specimen Description SPUTUM   ? Special Requests NONE   ? Sputum evaluation    ?  THIS SPECIMEN IS ACCEPTABLE FOR SPUTUM CULTURE ?Performed at [facility], [place] [address] [place], [place] [address] ?  ? Report Status 09/26/2021 FINAL   ?Strep pneumoniae urinary antigen     Status: None  ? Collection Time: 09/26/21  1:00 AM  ?Result Value Ref Range  ? Strep Pneumo  Urinary Antigen NEGATIVE NEGATIVE  ?MRSA Next Gen by PCR, Nasal     Status: None  ? Collection Time: 09/26/21  1:00 AM  ? Specimen: Nasal Mucosa; Nasal Swab  ?Result Value Ref Range  ? MRSA by PCR Next Gen NOT DETECTED N

## 2021-09-26 NOTE — Hospital Course (Addendum)
Mr. 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  Other history includes prostate cancer 2009 s/p radioactive seeds and now most recently concern for rectal cancer s/p partial colonoscopy with pathology showing intramucosal adenocarcinoma from 09/17/2021 specimen. ?He has been evaluated by oncology with plans for possible radiation treatment and potentially Xeloda.  He was not considered a good candidate for chemotherapy. ? ?He presented with essentially worsening functional status at home with associated weakness/debility and worsening shortness of breath along with worsening swelling in his extremities.  Multiple goals of care discussions were held with family on admission and with palliative care after admission.  ?He continued to have ongoing decline after admission with worsening respiratory status. Pulmonology also was consulted for Pleurx management and patient was trialed on placing tubing to suction with no significant improvement either. He also developed a pneumothorax and family declined additional chest tube placement given the progressive decline.  ?Ultimately, the focus was shifted towards comfort as patient was not expected to improve, especially with further underlying organ failure including renal failure ensuing.  ?With ongoing comfort efforts, patient had further decline and passed naturally on 09/26/2021 at 2237. ?

## 2021-09-26 NOTE — Assessment & Plan Note (Addendum)
-   Increased oxygen demand on admission, presumed from underlying worsening right-sided effusion and possible superimposed pneumonia ?-Overnight of 09/26/2021, developed worsening respiratory status and transferred to stepdown.  CXR showed decreased pleural effusion volume but new moderate right pneumothorax (see separate problem) ?-continue oxygen and wean as able ?- Continue Lasix (worsening renal function and UOP however) ?

## 2021-09-26 NOTE — Assessment & Plan Note (Addendum)
-   Hold home medications for now in setting of presumed sepsis ?- continue lopressor for now in setting of RVR overnight, but if further BP drop, will have to hold ?-Continue Lasix ?

## 2021-09-26 NOTE — Assessment & Plan Note (Addendum)
-   Patient has developed worsening edema in his arms and legs per family.  Overall, his nutrition has been poor at home and his hypoalbuminemia was discussed bedside with his daughter.  This is likely a consequence to his overall declining state. ?- in general, he is clinically worse today and family now aware of this and after more [place] discussions they are understanding that he may not survive hospitalization but we are continuing some medical measures for now to see if he improves  ?- continue strict I&O ?- continue lasix for now; monitor renal function; not an HD candidate  ?

## 2021-09-26 NOTE — Assessment & Plan Note (Addendum)
-   Potassium and renal function worsening since yesterday and now urine output also decreasing ?- spironolactone discontinued on admission  ?-Repeat potassium trending upward this morning, 6.4 mmol/L around 11 AM ?- Give repeat insulin/D50, Kayexalate enema due to being on BiPAP currently, and calcium gluconate.  Repeat potassium later this afternoon ?

## 2021-09-26 NOTE — Assessment & Plan Note (Signed)
-   in setting of underlying malignancy and poor oral intake ?- hold off on IVF in setting of almost anasarca appearance ?- continue albumin and lasix ?- BMP in am  ?

## 2021-09-26 NOTE — Progress Notes (Signed)
The proposed treatment discussed in conference is for discussion purpose only and is not a binding recommendation.  The patients have not been physically examined, or presented with their treatment options.  Therefore, final treatment plans cannot be decided.  

## 2021-09-26 NOTE — Assessment & Plan Note (Addendum)
-   continue lopressor ?-No longer on Eliquis per family ?

## 2021-09-26 NOTE — Assessment & Plan Note (Addendum)
-   Ongoing hypoalbuminemia likely from his ongoing poor nutritional status ?-Check prealbumin ?-Dietitian consult requested ?

## 2021-09-26 NOTE — Assessment & Plan Note (Addendum)
-   Was recently seen by oncology with tentative plans for radiation and possibly even Xeloda depending on his functional status which has now unfortunately declined ?- Intramucosal adenocarcinoma noted on surgical pathology from 09/17/2021 of rectal mass biopsy ?- Overall clinical picture has been taken into consideration and currently he is not thriving well and has poor functional status; I do not think he'd withstand treatment or aggressive measures in general, but further discussion can be held with oncology after discharge (if survives); family now amenable to palliative care involvement ?- guarded prognosis in general but prognosis worsening today ?

## 2021-09-26 NOTE — Procedures (Signed)
Pleural Fibrinolytic Administration Procedure Note ? ?ADE STMARIE  ?[id number]  ?01-15-1930 ? ?Date:09/26/21  ?Time:5:57 PM  ? ?Provider Performing:Kemp Gomes Rodman Pickle, MD ? ?Procedure: Pleural Fibrinolysis Initial day (77412) ? ?Indication(s) ?Fibrinolysis of complicated pleural effusion ? ?Consent ?Risks of the procedure as well as the alternatives and risks of each were explained to the patient and/or caregiver.  Consent for the procedure was obtained. ? ? ?Anesthesia ?None ? ? ?Time Out ?Verified patient identification, verified procedure, site/side was marked, verified correct patient position, special equipment/implants available, medications/allergies/relevant history reviewed, required imaging and test results available. ? ? ?Sterile Technique ?Hand hygiene, gloves ? ? ?Procedure Description ?Existing pleural catheter was cleaned and accessed in sterile manner.  10mg  of tPA in 30cc of saline and 5mg  of dornase in 30cc of sterile water were injected into pleural space using existing pleural catheter.  Catheter will be clamped for 1 hour and then placed back to suction. ? ? ?Complications/Tolerance ?None; patient tolerated the procedure well. ? ? ?EBL ?None ? ? ?Specimen(s) ?None ? ? ?

## 2021-09-26 NOTE — Telephone Encounter (Signed)
PT's daughter calls today in regards to PT's admission into the hospital. [name] states that PT will not be able to do the MyChart Video visit for tomorrow. [name] also states that PT is not doing well.  ?

## 2021-09-26 NOTE — Assessment & Plan Note (Signed)
-   presumed LUL stage 1a NSCLC s/p SBRT (completed 11/21/20 showing atypical cells on cytology) and ongoing recurrent/residual right pleural effusion (with negative cytology despite multiple samples since 2022) ?-CT chest from 06/23/2021 shows interval decrease (but still present) left apical pulmonary nodules (no liver lesions noted on follow up MRI abdomen) ?

## 2021-09-26 NOTE — Assessment & Plan Note (Signed)
-   continue SSI  ?

## 2021-09-26 NOTE — Progress Notes (Signed)
PCCM Progress Note ? ?Called to bedside for clots in PleurX line. Flushed with 10cc saline however minimal drainage. Discussed with family at bedside risks vs benefits of tPA/Dnase including risk of bleeding. Alternative management would be to continue saline flushes to facilitate drainage. After addressing questions and concerns, patient and his daughter agreed to proceed with pleural lytics. ? ?Discussed plan with bedside and charge RN. ? ?Rodman Pickle, M.D. ?[facility] ?09/26/2021 6:00 PM  ? ?See Amion for personal pager ?For hours between 7 PM to 7 AM, please call Elink for urgent questions ? ?

## 2021-09-26 NOTE — ED Notes (Signed)
Pt currently sleeping, respirations equal & unlabored.  ?

## 2021-09-26 NOTE — ED Notes (Signed)
Pt expressed he has not had a bowel movement at this time and does not need any bathroom assistance. ?

## 2021-09-26 NOTE — Telephone Encounter (Signed)
Noted, let us know if there is anything we can do and fine to cancel/reschedule.  ?

## 2021-09-27 ENCOUNTER — Inpatient Hospital Stay (HOSPITAL_COMMUNITY): Payer: Medicare Other

## 2021-09-27 ENCOUNTER — Ambulatory Visit: Payer: Medicare Other | Admitting: Internal Medicine

## 2021-09-27 ENCOUNTER — Telehealth: Payer: Medicare Other | Admitting: Internal Medicine

## 2021-09-27 DIAGNOSIS — E43 Unspecified severe protein-calorie malnutrition: Secondary | ICD-10-CM | POA: Diagnosis not present

## 2021-09-27 DIAGNOSIS — R6520 Severe sepsis without septic shock: Secondary | ICD-10-CM

## 2021-09-27 DIAGNOSIS — Z7189 Other specified counseling: Secondary | ICD-10-CM

## 2021-09-27 DIAGNOSIS — Z515 Encounter for palliative care: Secondary | ICD-10-CM

## 2021-09-27 DIAGNOSIS — J9621 Acute and chronic respiratory failure with hypoxia: Secondary | ICD-10-CM | POA: Diagnosis not present

## 2021-09-27 DIAGNOSIS — E875 Hyperkalemia: Secondary | ICD-10-CM

## 2021-09-27 DIAGNOSIS — A419 Sepsis, unspecified organism: Secondary | ICD-10-CM | POA: Diagnosis not present

## 2021-09-27 DIAGNOSIS — J939 Pneumothorax, unspecified: Secondary | ICD-10-CM

## 2021-09-27 DIAGNOSIS — N179 Acute kidney failure, unspecified: Secondary | ICD-10-CM

## 2021-09-27 DIAGNOSIS — J90 Pleural effusion, not elsewhere classified: Secondary | ICD-10-CM | POA: Diagnosis not present

## 2021-09-27 LAB — GLUCOSE, CAPILLARY
Glucose-Capillary: 190 mg/dL — ABNORMAL HIGH (ref 70–99)
Glucose-Capillary: 289 mg/dL — ABNORMAL HIGH (ref 70–99)
Glucose-Capillary: 201 mg/dL — ABNORMAL HIGH (ref 70–99)
Glucose-Capillary: 71 mg/dL (ref 70–99)
Glucose-Capillary: 73 mg/dL (ref 70–99)

## 2021-09-27 LAB — BLOOD GAS, ARTERIAL
Acid-Base Excess: 4.3 mmol/L — ABNORMAL HIGH (ref 0.0–2.0)
Acid-Base Excess: 3.8 mmol/L — ABNORMAL HIGH (ref 0.0–2.0)
Bicarbonate: 38.8 mmol/L — ABNORMAL HIGH (ref 20.0–28.0)
Bicarbonate: 36 mmol/L — ABNORMAL HIGH (ref 20.0–28.0)
Drawn by: 56037
Expiratory PAP: 8 cmH2O
FIO2: 50 %
Inspiratory PAP: 20 cmH2O
O2 Content: 4 L/min
O2 Saturation: 93.4 %
O2 Saturation: 98.9 %
Patient temperature: 35.8
Patient temperature: 35.5
pCO2 arterial: >123 mmHg (ref 32–48)
pCO2 arterial: 95 mmHg (ref 32–48)
pH, Arterial: 7.09 — CL (ref 7.35–7.45)
pH, Arterial: 7.18 — CL (ref 7.35–7.45)
pO2, Arterial: 61 mmHg — ABNORMAL LOW (ref 83–108)
pO2, Arterial: 110 mmHg — ABNORMAL HIGH (ref 83–108)

## 2021-09-27 LAB — CBC WITH DIFFERENTIAL/PLATELET
Abs Immature Granulocytes: 0.82 10*3/uL — ABNORMAL HIGH (ref 0.00–0.07)
Basophils Absolute: 0.1 10*3/uL (ref 0.0–0.1)
Basophils Relative: 0 %
Eosinophils Absolute: 0 10*3/uL (ref 0.0–0.5)
Eosinophils Relative: 0 %
HCT: 33.4 % — ABNORMAL LOW (ref 39.0–52.0)
Hemoglobin: 9.6 g/dL — ABNORMAL LOW (ref 13.0–17.0)
Immature Granulocytes: 3 %
Lymphocytes Relative: 0 %
Lymphs Abs: 0.1 10*3/uL — ABNORMAL LOW (ref 0.7–4.0)
MCH: 26.4 pg (ref 26.0–34.0)
MCHC: 28.7 g/dL — ABNORMAL LOW (ref 30.0–36.0)
MCV: 91.8 fL (ref 80.0–100.0)
Monocytes Absolute: 2.8 10*3/uL — ABNORMAL HIGH (ref 0.1–1.0)
Monocytes Relative: 9 %
Neutro Abs: 28.1 10*3/uL — ABNORMAL HIGH (ref 1.7–7.7)
Neutrophils Relative %: 88 %
Platelets: 324 10*3/uL (ref 150–400)
RBC: 3.64 MIL/uL — ABNORMAL LOW (ref 4.22–5.81)
RDW: 16.2 % — ABNORMAL HIGH (ref 11.5–15.5)
WBC: 31.9 10*3/uL — ABNORMAL HIGH (ref 4.0–10.5)
nRBC: 0.5 % — ABNORMAL HIGH (ref 0.0–0.2)

## 2021-09-27 LAB — POTASSIUM: Potassium: 6.4 mmol/L (ref 3.5–5.1)

## 2021-09-27 LAB — BASIC METABOLIC PANEL
Anion gap: 12 (ref 5–15)
Anion gap: 11 (ref 5–15)
Anion gap: 10 (ref 5–15)
BUN: 65 mg/dL — ABNORMAL HIGH (ref 8–23)
BUN: 69 mg/dL — ABNORMAL HIGH (ref 8–23)
BUN: 74 mg/dL — ABNORMAL HIGH (ref 8–23)
CO2: 32 mmol/L (ref 22–32)
CO2: 32 mmol/L (ref 22–32)
CO2: 35 mmol/L — ABNORMAL HIGH (ref 22–32)
Calcium: 9 mg/dL (ref 8.9–10.3)
Calcium: 8.9 mg/dL (ref 8.9–10.3)
Calcium: 8.9 mg/dL (ref 8.9–10.3)
Chloride: 84 mmol/L — ABNORMAL LOW (ref 98–111)
Chloride: 86 mmol/L — ABNORMAL LOW (ref 98–111)
Chloride: 86 mmol/L — ABNORMAL LOW (ref 98–111)
Creatinine, Ser: 1.52 mg/dL — ABNORMAL HIGH (ref 0.61–1.24)
Creatinine, Ser: 1.71 mg/dL — ABNORMAL HIGH (ref 0.61–1.24)
Creatinine, Ser: 1.93 mg/dL — ABNORMAL HIGH (ref 0.61–1.24)
GFR, Estimated: 43 mL/min — ABNORMAL LOW (ref >60)
GFR, Estimated: 38 mL/min — ABNORMAL LOW (ref >60)
GFR, Estimated: 32 mL/min — ABNORMAL LOW (ref >60)
Glucose, Bld: 220 mg/dL — ABNORMAL HIGH (ref 70–99)
Glucose, Bld: 220 mg/dL — ABNORMAL HIGH (ref 70–99)
Glucose, Bld: 158 mg/dL — ABNORMAL HIGH (ref 70–99)
Potassium: 5.9 mmol/L — ABNORMAL HIGH (ref 3.5–5.1)
Potassium: 5.2 mmol/L — ABNORMAL HIGH (ref 3.5–5.1)
Potassium: 5.6 mmol/L — ABNORMAL HIGH (ref 3.5–5.1)
Sodium: 128 mmol/L — ABNORMAL LOW (ref 135–145)
Sodium: 129 mmol/L — ABNORMAL LOW (ref 135–145)
Sodium: 131 mmol/L — ABNORMAL LOW (ref 135–145)

## 2021-09-27 LAB — SURGICAL PATHOLOGY

## 2021-09-27 LAB — MAGNESIUM: Magnesium: 2.4 mg/dL (ref 1.7–2.4)

## 2021-09-27 LAB — PROCALCITONIN: Procalcitonin: 0.45 ng/mL

## 2021-09-27 MED ORDER — CHLORHEXIDINE GLUCONATE CLOTH 2 % EX PADS: 6.0000 | MEDICATED_PAD | Freq: Every day | CUTANEOUS | Status: DC

## 2021-09-27 MED ORDER — SODIUM POLYSTYRENE SULFONATE 15 GM/60ML PO SUSP
60.0000 g | Freq: Once | ORAL | Status: AC
  Administered 2021-09-27: 60 g via RECTAL
  Filled 2021-09-27: qty 240

## 2021-09-27 MED ORDER — CALCIUM GLUCONATE-NACL 2-0.675 GM/100ML-% IV SOLN
2.0000 g | Freq: Once | INTRAVENOUS | Status: AC
  Administered 2021-09-27: 2000 mg via INTRAVENOUS
  Filled 2021-09-27: qty 100

## 2021-09-27 MED ORDER — DEXTROSE 50 % IV SOLN: 25.0000 g | Freq: Once | INTRAVENOUS | Status: DC

## 2021-09-27 MED ORDER — DEXTROSE 50 % IV SOLN
1.0000 | Freq: Once | INTRAVENOUS | Status: AC
  Administered 2021-09-27: 50 mL via INTRAVENOUS
  Filled 2021-09-27: qty 50

## 2021-09-27 MED ORDER — ALBUMIN HUMAN 25 % IV SOLN
25.0000 g | Freq: Four times a day (QID) | INTRAVENOUS | Status: DC
  Filled 2021-09-27: qty 100

## 2021-09-27 MED ORDER — MORPHINE SULFATE (PF) 2 MG/ML IV SOLN
1.0000 mg | INTRAVENOUS | Status: DC | PRN
  Administered 2021-09-27: 2 mg via INTRAVENOUS
  Filled 2021-09-27: qty 1

## 2021-09-27 MED ORDER — MORPHINE SULFATE (PF) 2 MG/ML IV SOLN: 1.0000 mg | INTRAVENOUS | Status: DC | PRN

## 2021-09-27 MED ORDER — LEVALBUTEROL HCL 1.25 MG/0.5ML IN NEBU
INHALATION_SOLUTION | RESPIRATORY_TRACT | Status: AC
  Filled 2021-09-27: qty 0.5

## 2021-09-27 MED ORDER — GLYCOPYRROLATE 0.2 MG/ML IJ SOLN
0.2000 mg | INTRAMUSCULAR | Status: DC | PRN
  Administered 2021-09-27: 0.2 mg via INTRAVENOUS
  Filled 2021-09-27 (×2): qty 1

## 2021-09-27 MED ORDER — FUROSEMIDE 10 MG/ML IJ SOLN
40.0000 mg | Freq: Once | INTRAMUSCULAR | Status: AC
  Administered 2021-09-27: 40 mg via INTRAVENOUS
  Filled 2021-09-27: qty 4

## 2021-09-27 MED ORDER — INSULIN ASPART 100 UNIT/ML IJ SOLN: 10.0000 [IU] | Freq: Once | INTRAMUSCULAR | Status: DC

## 2021-09-27 MED ORDER — SODIUM CHLORIDE 0.9 % IV SOLN: 2.0000 g | INTRAVENOUS | Status: DC

## 2021-09-27 MED ORDER — LEVALBUTEROL HCL 1.25 MG/0.5ML IN NEBU: 1.2500 mg | INHALATION_SOLUTION | Freq: Four times a day (QID) | RESPIRATORY_TRACT | Status: DC | PRN

## 2021-09-27 MED ORDER — INSULIN ASPART 100 UNIT/ML IV SOLN
10.0000 [IU] | Freq: Once | INTRAVENOUS | Status: AC
  Administered 2021-09-27: 10 [IU] via INTRAVENOUS

## 2021-09-27 NOTE — TOC Initial Note (Signed)
Transition of Care (TOC) - Initial/Assessment Note  ? ?Patient Details  ?Name: Adam Briggs ?MRN: [id number] ?Date of Birth: 1929-05-28 ? ?Transition of Care (TOC) CM/SW Contact:    ?Sherie Don, LCSW ?Phone Number: ?10/16/2021, 9:27 AM ? ?Clinical Narrative: Patient was recently discharged from the hospital and his active with [facility] (RN/PT/OT) and receives home oxygen through [facility]. TOC to follow for discharge needs. ? ?Expected Discharge Plan: [place] ?Barriers to Discharge: Continued Medical Work up ? ?Expected Discharge Plan and Services ?Expected Discharge Plan: [place] ?In-house Referral: Clinical Social Work ?Living arrangements for the past 2 months: [place]             ?DME Arranged: N/A ?DME Agency: NA ? ?Prior Living Arrangements/Services ?Living arrangements for the past 2 months: [place] ?Lives with:: Adult Children ?Patient language and need for interpreter reviewed:: Yes ?Need for Family Participation in Patient Care: Yes (Comment) ?Care giver support system in place?: Yes (comment) ?Current home services: Home OT, Home PT, Home RN, DME (Pleurex drains, home O2) ?Criminal Activity/Legal Involvement Pertinent to Current Situation/Hospitalization: No - Comment as needed ? ?Activities of Daily Living ?Home Assistive Devices/Equipment: Gilford Rile (specify type), Cane (specify quad or straight) ?ADL Screening (condition at time of admission) ?Patient's cognitive ability adequate to safely complete daily activities?: Yes ?Is the patient deaf or have difficulty hearing?: Yes ?Does the patient have difficulty seeing, even when wearing glasses/contacts?: Yes ?Does the patient have difficulty concentrating, remembering, or making decisions?: No ?Patient able to express need for assistance with ADLs?: Yes ?Does the patient have difficulty dressing or bathing?: Yes ?Independently performs ADLs?: No ?Communication: Independent ?Dressing (OT):  Needs assistance ?Is this a change from baseline?: Pre-admission baseline ?Grooming: Independent ?Feeding: Independent ?Bathing: Needs assistance ?Is this a change from baseline?: Pre-admission baseline ?Toileting: Needs assistance ?Is this a change from baseline?: Pre-admission baseline ?In/Out Bed: Needs assistance ?Is this a change from baseline?: Pre-admission baseline ?Walks in Home: Needs assistance ?Is this a change from baseline?: Pre-admission baseline ?Does the patient have difficulty walking or climbing stairs?: Yes ?Weakness of Legs: Both ?Weakness of Arms/Hands: Both ? ?Emotional Assessment ?Alcohol / Substance Use: Not Applicable ? ?Admission diagnosis:  Weakness [R53.1] ?Hypoxia [R09.02] ?Acute respiratory failure with hypoxia ([place]) [J96.01] ?Patient Active Problem List  ? Diagnosis Date Noted  ? Hyperkalemia 09/26/2021  ? Sepsis due to pneumonia ([place]) 09/26/2021  ? Acute respiratory failure with hypoxia ([place]) 10/07/2021  ? Chronic blood loss anemia 10/01/2021  ? Protein-calorie malnutrition, moderate ([place]) 10/05/2021  ? Edema 10/05/2021  ? Left lower lobe pulmonary infiltrate 09/19/2021  ? Rectal adenocarcinoma ([place]) 09/18/2021  ? Rectal bleeding   ? Rectal mass   ? GI bleed 09/15/2021  ? ABLA (acute blood loss anemia) 09/15/2021  ? Hyponatremia 09/15/2021  ? Recurrent pleural effusion 08/08/2021  ? Acute on chronic respiratory failure with hypoxia ([place]) 07/25/2021  ? Rash 02/02/2021  ? Pressure injury of skin 01/14/2021  ? Pleural effusion, left 01/09/2021  ? Liver lesion 01/09/2021  ? Other cirrhosis of liver ([place]) 01/09/2021  ? Loculated right lateral pneumothorax 01/08/2021  ? Leg swelling 11/27/2020  ? Malignant neoplasm of upper lobe of left lung ([place]) 10/31/2020  ? Recurrent right pleural effusion   ? Atrial fibrillation ([place])   ? S/P thoracentesis   ? SOB (shortness of breath) 09/15/2020  ? Pleural effusion, right 09/15/2020  ? Atrial fibrillation, new onset ([place]) 09/15/2020  ? Wheezing  04/24/2019  ? Routine general medical examination at a health care facility 06/23/2014  ? Inguinal hernia 05/05/2014  ? Moderate aortic stenosis 10/30/2011  ? Abnormal ECG 09/24/2010  ? Type 2 diabetes mellitus with hyperlipidemia ([place]) 03/26/2010  ? Hyperlipidemia associated with type 2 diabetes mellitus ([place]) 12/28/2007  ? Essential hypertension 02/07/2007  ? ?PCP:  [name], MD ?Pharmacy:   ?[id number] [place] [place] [address] ?[place] ?[place] [address] ?Phone: [phone] Fax: [phone] ? ?Readmission Risk Interventions ? ?  10/17/2021  ?  9:25 AM 09/18/2021  ? 11:59 AM  ?Readmission Risk Prevention Plan  ?Transportation Screening Complete Complete  ?PCP or Specialist Appt within 3-5 Days  Complete  ?[place] or Home Care Consult Complete Complete  ?Social Work Consult for [place] Planning/Counseling Complete Complete  ?Palliative Care Screening Not Applicable Complete  ?Medication Review Press photographer)  Complete  ? ?

## 2021-09-27 NOTE — Progress Notes (Signed)
PT Cancellation Note ? ?Patient Details ?Name: TYNER CODNER ?MRN: [id number] ?DOB: 1929-08-11 ? ? ?Cancelled Treatment:    Reason Eval/Treat Not Completed: Medical issues which prohibited therapy, moved to SDU, on BiPAP. Will follow. ? ? ?Ryliegh Mcduffey, Shella Maxim ?10/09/2021, 7:31 AM ?Tresa Endo PT ?Acute Rehabilitation Services ?Pager [phone] ?Office [phone] ? ?

## 2021-09-27 NOTE — Consult Note (Addendum)
? ?NAME:  Adam Briggs, MRN:  [id number], DOB:  June 11, 1929, LOS: 2 ?ADMISSION DATE:  09/24/2021, CONSULTATION DATE:  09/26/21 ?REFERRING MD:  Girguis CHIEF COMPLAINT:  Dyspnea  ? ?History of Present Illness:  ?Adam Briggs is a 88 y.o. male who has a PMH as outlined below including a known chronic right-sided loculated effusion with history of ex vacuo pneumothorax.  He is followed by Dr. [name] and had a right-sided Pleurx catheter placed on 08/08/2021 due to recurrent effusions despite multiple thoracentesis and pigtail catheter placement.  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  His fluid analysis from previous thoracenteses have all been lymphocyte predominant. ? ?Since he had the Pleurx catheter placed 08/08/2021, family has been draining it every day.  That daughter claims that they drained anywhere from 100 to 150 cc/day.  He had been doing fairly well up until 09/15/2021 when he required admission for dyspnea and GI bleed secondary to newly discovered rectal mass with biopsy consistent with adenocarcinoma.  He was discharged on/2/23.  He had been doing all right at home but had ongoing dyspnea along with cough.  Family noted that ever since his discharge, his drainage from his Pleurx catheter had dropped down to around 75 cc/day.  His last drainage was on Monday, 09/24/2021. ? ?On 10/04/2021, he presented to [facility] long ED with dyspnea, cough, generalized weakness.  Chest x-ray revealed slight increase in size of loculated right pleural effusion along with probable left-sided lower lobe infiltrate.  He was started on vancomycin and cefepime and cultures were obtained.  He was admitted by the hospitalist service for ongoing management. ? ?On 09/26/2021, PCCM asked to weigh in given his slight increase in the  known right-sided loculated pleural effusion. ? ? ?Pertinent  Medical History:  ?has Type 2 diabetes mellitus with hyperlipidemia ([place]); Hyperlipidemia associated with type 2 diabetes mellitus ([place]); Essential hypertension; Abnormal ECG; Moderate aortic stenosis; Inguinal hernia; Routine general medical examination at a health care facility; Wheezing; SOB (shortness of breath); Pleural effusion, right; Atrial fibrillation, new onset ([place]); Atrial fibrillation ([place]); S/P thoracentesis; Recurrent right pleural effusion; Malignant neoplasm of upper lobe of left lung ([place]); Leg swelling; Loculated right lateral pneumothorax; Pleural effusion, left; Liver lesion; Other cirrhosis of liver ([place]); Pressure injury of skin; Rash; Acute on chronic respiratory failure with hypoxia ([place]); Recurrent pleural effusion; GI bleed; ABLA (acute blood loss anemia); Hyponatremia; Rectal bleeding; Rectal mass; Rectal adenocarcinoma ([place]); Acute respiratory failure with hypoxia ([place]); Chronic blood loss anemia; Protein-calorie malnutrition, moderate ([place]); Edema; Left lower lobe pulmonary infiltrate; Hyperkalemia; and Sepsis due to pneumonia [facility]) on their problem list. ? ?Significant Hospital Events: ?Including procedures, antibiotic start and stop dates in addition to other pertinent events   ?5/9 admit. ?5/10 PCCM consult.  Right-sided Pleurx catheter to be placed to 20 cm suction. ?5/11 Transferred to ICU overnight for respiratory distress. S/p tPA/Dnase with chest tube ~600cc output ? ?Interim History / Subjective:  ?Transferred to ICU overnight for respiratory distress. S/p tPA/Dnase with chest tube ~600cc output. ? ?Currently on BiPAP ? ?CXR this am with pneumothorax. Chest tube placed on suction however no improvement on recent CXR ? ?Palliative meeting with family held. Family does not wish for additional interventions and would not want procedures including chest tube. They wish  to pursue comfort measures at this  time. ?Objective:  ?Blood pressure 110/74, pulse 78, temperature 98.2 ?F (36.8 ?C), temperature source Axillary, resp. rate 15, height 5\' 10"  (1.778 m), weight 75.8 kg, SpO2 98 %. ?   ?FiO2 (%):  [45 %] 45 %  ? ?Intake/Output Summary (Last 24 hours) at 10/07/2021 1235 ?Last data filed at 09/23/2021 1130 ?Gross per 24 hour  ?Intake 480 ml  ?Output 600 ml  ?Net -120 ml  ? ?Filed Weights  ? 09/26/21 1157 09/26/21 1244 09/23/2021 0500  ?Weight: 73.3 kg 73.3 kg 75.8 kg  ? ?Physical Exam: ?General: Critically ill-appearing, lethargic ?HENT: [place], AT, BiPAP in place ?Eyes: EOMI, no scleral icterus ?Respiratory: Diminished air entry bilaterally.  No wheezing. ?Cardiovascular: RRR, -M/R/G, no JVD ?GI: BS+, soft, nontender ?Extremities: 2+ pedal edema,-tenderness ?Neuro: Lethargic, opens eyes to voice, CNII-XII grossly intact, does not follow commands ? ? ?Labs/imaging personally reviewed:  ?CXR 5/9 > slight increase R loculated effusion, L basilar infiltrate, mild edema. ? ?Assessment & Plan:  ? ?Acute on chronic hypoxemic respiratory failure ?Right pneumothorax ?Recurrent right loculated pleural effusion s/p PleurX - neg malignancy with lymphocyte predominance ?Recent decreased Pleurx output with evidence of increased effusion + volume overload on admission CXR ? ?Improved chest tube output s/p tPA/Dnase x1 however new pneumothorax contributing to respiratory failure. Family wishes to pursue palliative measures at this time. Declined chest tube for PTX. ? ?--Continue BiPAP or supplemental oxygen based on patient's comfort ?--Continue chest tube to -20 cm H20 suction for now ?--Discontinue chest tube when ready to withdraw ?  ?Hx presumed LUL NSCLC - hypermetabolic lesions however neg path in 10/2020 ?--No active issues ? ?Rest per primary team. ?Pulmonary team will sign off ? ? ?Best practice (evaluated daily):  ?Per primary team. ? ?The patient is critically ill with respiratory failure and requires high complexity decision  making for assessment and support, frequent evaluation and titration of therapies, application of advanced monitoring technologies and extensive interpretation of multiple databases.  I discussed with family GOC with palliative care. They declined chest tube or aggressive interventions which is appropriate. ?Independent Critical Care Time: 45 Minutes.  ? ?Rodman Pickle, M.D. ?[facility] ?10/08/2021 12:36 PM  ? ?Please see [place] for pager number to reach on-call Pulmonary and Critical Care Team. ? ?

## 2021-09-27 NOTE — Assessment & Plan Note (Signed)
-   I have been having ongoing discussions with his daughter since admission.  He has had a progressive decline in regards to respiratory status and now renal function this morning.  Urine output also significantly decreasing along with worsening BUN, creatinine, and potassium.  He would not be an HD candidate. Family understands worsening status and prognosis at this time. They would like to continue treatment but main focus is making sure he were to not struggle from a breathing standpoint but they also understand nutrition and renal function could also begin to play roles going forward.  ?- family okay with Palliative care involvement at this time and cautious watchful waiting  ?

## 2021-09-27 NOTE — Progress Notes (Signed)
Garwood Wentzell Palinkas   DOB:1929-09-02   LF#:[id number]   HEN#:[id number] ? ?Oncology follow up  ? ?Subjective: I met pt during his last hospital admission for newly diagnosed rectal cancer.  Patient was readmitted to the hospital last night for respiratory failure.  We reviewed his case in our GI tumor board yesterday morning, and I stopped by to see him to discuss the tumor board recommendations.  Patient is lethargic in bed, did not respond to me. His daughter is at bedside.  ? ? ?Objective:  ?Vitals:  ? 09/26/2021 1700 10/03/2021 1800  ?BP: 136/65 125/69  ?Pulse: (!) 106 (!) 113  ?Resp: (!) 30 20  ?Temp:    ?SpO2: 98% 90%  ?  Body mass index is 23.98 kg/m?. ? ?Intake/Output Summary (Last 24 hours) at 10/09/2021 1942 ?Last data filed at 10/09/2021 1700 ?Gross per 24 hour  ?Intake 488.27 ml  ?Output 475 ml  ?Net 13.27 ml  ? ? ? I did not exam pt  ? ?CBG (last 3)  ?Recent Labs  ?  10/06/2021 ?0758 10/05/2021 ?1226 10/15/2021 ?1228  ?GLUCAP 201* 71 73  ? ? ? ?Labs:  ? ? ?Urine Studies ?No results for input(s): UHGB, CRYS in the last 72 hours. ? ?Invalid input(s): UACOL, UAPR, USPG, UPH, UTP, UGL, UKET, UBIL, UNIT, UROB, ULEU, UEPI, UWBC, URBC, UBAC, CAST, UCOM, BILUA ? ?Basic Metabolic Panel: ?Recent Labs  ?Lab 09/30/2021 ?1009 09/26/21 ?0432 10/10/2021 ?0300 09/30/2021 ?0719 09/24/2021 ?1116 10/08/2021 ?1318  ?NA 129* 132* 128* 129*  --  131*  ?K 5.1 5.2* 5.9* 5.2* 6.4* 5.6*  ?CL 86* 88* 84* 86*  --  86*  ?CO2 33* 38* 32 32  --  35*  ?GLUCOSE 147* 135* 220* 220*  --  158*  ?BUN 49* 51* 65* 69*  --  74*  ?CREATININE 0.99 1.05 1.52* 1.71*  --  1.93*  ?CALCIUM 8.4* 8.4* 9.0 8.9  --  8.9  ?MG  --   --  2.4  --   --   --   ? ?GFR ?Estimated Creatinine Clearance: 26.3 mL/min (A) (by C-G formula based on SCr of 1.93 mg/dL (H)). ?Liver Function Tests: ?Recent Labs  ?Lab 09/20/2021 ?1009 09/26/21 ?0432 09/26/21 ?1207  ?AST 15 15  --   ?ALT 22 20  --   ?ALKPHOS 58 51  --   ?BILITOT 0.8 0.6  --   ?PROT 6.0* 5.8* 6.5  ?ALBUMIN 2.8* 2.7*  --   ? ?No results for  input(s): LIPASE, AMYLASE in the last 168 hours. ?No results for input(s): AMMONIA in the last 168 hours. ?Coagulation profile ?Recent Labs  ?Lab 10/06/2021 ?1009  ?INR 1.1  ? ? ?CBC: ?Recent Labs  ?Lab 10/13/2021 ?1009 09/26/21 ?0432 09/30/2021 ?0300  ?WBC 14.9* 12.7* 31.9*  ?NEUTROABS  --   --  28.1*  ?HGB 9.2* 9.0* 9.6*  ?HCT 30.1* 31.0* 33.4*  ?MCV 87.8 89.9 91.8  ?PLT 234 232 324  ? ?Cardiac Enzymes: ?No results for input(s): CKTOTAL, CKMB, CKMBINDEX, TROPONINI in the last 168 hours. ?BNP: ?Invalid input(s): POCBNP ?CBG: ?Recent Labs  ?Lab 09/23/2021 ?0214 10/08/2021 ?3614 10/11/2021 ?0758 10/02/2021 ?1226 10/11/2021 ?1228  ?GLUCAP 190* 289* 201* 71 73  ? ?D-Dimer ?No results for input(s): DDIMER in the last 72 hours. ?Hgb A1c ?Recent Labs  ?  09/26/21 ?0433  ?HGBA1C 5.8*  ? ?Lipid Profile ?No results for input(s): CHOL, HDL, LDLCALC, TRIG, CHOLHDL, LDLDIRECT in the last 72 hours. ?Thyroid function studies ?No results for input(s): TSH, T4TOTAL,  T3FREE, THYROIDAB in the last 72 hours. ? ?Invalid input(s): FREET3 ?Anemia work up ?No results for input(s): VITAMINB12, FOLATE, FERRITIN, TIBC, IRON, RETICCTPCT in the last 72 hours. ?Microbiology ?Recent Results (from the past 240 hour(s))  ?Culture, blood (routine x 2)     Status: None (Preliminary result)  ? Collection Time: 09/29/2021  3:27 PM  ? Specimen: BLOOD  ?Result Value Ref Range Status  ? Specimen Description   Final  ?  BLOOD BLOOD LEFT FOREARM ?Performed at [facility], [place] [address] [place], [place] [address] ?  ? Special Requests   Final  ?  BOTTLES DRAWN AEROBIC AND ANAEROBIC Blood Culture results may not be optimal due to an excessive volume of blood received in culture bottles ?Performed at [facility], [place] [address] [place], [place] [address] ?  ? Culture   Final  ?  NO GROWTH 2 DAYS ?Performed at [facility], [place] [address], [place], [place] [address] ?  ? Report Status PENDING  Incomplete  ?Culture, blood (routine  x 2)     Status: None (Preliminary result)  ? Collection Time: 09/29/2021  3:40 PM  ? Specimen: BLOOD  ?Result Value Ref Range Status  ? Specimen Description   Final  ?  BLOOD LEFT UPPER ARM ?Performed at [facility], [place] [address], [place], [place] [address] ?  ? Special Requests   Final  ?  BOTTLES DRAWN AEROBIC AND ANAEROBIC Blood Culture results may not be optimal due to an excessive volume of blood received in culture bottles ?Performed at [facility], [place] [address], [place], [place] [address] ?  ? Culture   Final  ?  NO GROWTH 2 DAYS ?Performed at [facility], [place] [address], [place], [place] [address] ?  ? Report Status PENDING  Incomplete  ?Expectorated Sputum Assessment w Gram Stain, Rflx to Resp Cult     Status: None  ? Collection Time: 09/26/21  1:00 AM  ? Specimen: Sputum  ?Result Value Ref Range Status  ? Specimen Description SPUTUM  Final  ? Special Requests NONE  Final  ? Sputum evaluation   Final  ?  THIS SPECIMEN IS ACCEPTABLE FOR SPUTUM CULTURE ?Performed at [facility], [place] [address] [place], [place] [address] ?  ? Report Status 09/26/2021 FINAL  Final  ?MRSA Next Gen by PCR, Nasal     Status: None  ? Collection Time: 09/26/21  1:00 AM  ? Specimen: Nasal Mucosa; Nasal Swab  ?Result Value Ref Range Status  ? MRSA by PCR Next Gen NOT DETECTED NOT DETECTED Final  ?  Comment: (NOTE) ?The GeneXpert MRSA Assay (FDA approved for NASAL specimens only), ?is one component of a comprehensive MRSA colonization surveillance ?program. It is not intended to diagnose MRSA infection nor to guide ?or monitor treatment for MRSA infections. ?Test performance is not FDA approved in patients less than 2 years ?old. ?Performed at [facility], [place] [address] ?[place], [place] [address] ?  ?Culture, Respiratory w Gram Stain     Status: None (Preliminary result)  ? Collection Time: 09/26/21  1:00 AM  ? Specimen: SPU  ?Result Value Ref  Range Status  ? Specimen Description   Final  ?  SPUTUM ?Performed at [facility], [place] [address] [place], [place] [address] ?  ? Special Requests   Final  ?  NONE Reflexed from [id number] ?Performed at [facility], [place] [address] [place], [place] [address] ?  ? Gram Stain   Final  ?  NO SQUAMOUS EPITHELIAL CELLS SEEN ?FEW WBC SEEN ?MODERATE GRAM NEGATIVE RODS ?MODERATE GRAM POSITIVE COCCI ?  ? Culture   Final  ?  CULTURE REINCUBATED FOR BETTER GROWTH ?Performed at [facility], [place] [address] [place], [place] [address] ?  ? Report Status PENDING  Incomplete  ? ? ? ? ?Studies:  ?DG CHEST PORT 1 VIEW ? ?Result Date: 10/08/2021 ?CLINICAL DATA:  Pneumothorax EXAM: PORTABLE CHEST 1 VIEW COMPARISON:  Chest x-ray dated Sep 27, 2021 FINDINGS: Unchanged cardiac and mediastinal contours. Stable moderate right hydropneumothorax with chest tube in place. Unchanged heterogeneous opacities of the right hemithorax. Similar small right pleural effusion. IMPRESSION: Stable size of moderate right hydropneumothorax. Chest tube in place. Electronically Signed   By: Yetta Glassman M.D.   On: 09/29/2021 13:18  ? ?DG CHEST PORT 1 VIEW ? ?Result Date: 09/24/2021 ?CLINICAL DATA:  Right pneumothorax EXAM: PORTABLE CHEST 1 VIEW COMPARISON:  Previous studies including the examination done earlier today FINDINGS: There is increase in size of right pneumothorax. Atelectasis is seen in the right lung. There is small to moderate right pleural effusion. Right chest tube is noted with its tip in the medial right mid lung fields. There is interval decrease in small left pleural effusion. There is improvement in aeration of left parahilar region. IMPRESSION: There is increase in size of right pneumothorax. Atelectasis is seen in the right lung. Small to moderate right pleural effusion. There is improvement in aeration of left parahilar region which may suggest decrease in pulmonary edema or decrease in  layering left pleural effusion. Electronically Signed   By: Elmer Picker M.D.   On: 09/18/2021 08:39  ? ?DG CHEST PORT 1 VIEW ? ?Result Date: 10/17/2021 ?CLINICAL DATA:  Right pleural effusion. E

## 2021-09-27 NOTE — Progress Notes (Signed)
Initial Nutrition Assessment ? ?DOCUMENTATION CODES:  ? ?Not applicable ? ?INTERVENTION:  ?- will monitor for GOC and associated nutrition-related needs.  ? ? ?NUTRITION DIAGNOSIS:  ? ?Inadequate oral intake related to inability to eat as evidenced by NPO status (patient NPO while on BiPAP). ? ?GOAL:  ? ?Patient will meet greater than or equal to 90% of their needs ? ?MONITOR:  ? ?Labs, Weight trends, Other (Comment) (medical course and GOC) ? ?REASON FOR ASSESSMENT:  ? ?Consult ?Assessment of nutrition requirement/status ? ?ASSESSMENT:  ? ?88 year-old male with medical history of suspected stage 1a NSCLC LUL s/p SBRT (completed 11/21/20 showing atypical cells on cytology), recurrent R pleural effusion (negative cytology, now s/p PleurX cath in 07/2021), Afib (on Eliquis), HTN, cirrhosis, GERD, diverticulosis, prostate cancer in 2009, heart murmur, BPH, and hypercholesterolemia. Recently there was a concern for rectal cancer and he is s/p partial colonoscopy with pathology showing intramucosal adenocarcinoma from 09/17/2021 specimen. He has been considered to not be a candidate for chemo. He presented to the ED due to weakness, debility, and worsening shortness of breath and swelling of extremities. He was admitted due to acute on chronic respiratory failure and is DNR/DNI. ? ?Rapid Response early this AM. He has remained on BiPAP throughout day shift. Attending MD and CCM MD notes reviewed. Able to communicate with RN. Several family members gathered at bedside and ongoing [place] discussions occurring.  ? ?He has not been seen by a [place] RD at any time in the past.  ? ?Weight today is 167 lb and weight yesterday was 162 lb. Weight today is the highest weight in at least 10 months.  ? ?Mild pitting edema to BUE and moderate pitting edema BLE documented in the edema section of flow sheet.  ? ? ?Labs reviewed; CBGs: 289, 201, 71, 73 mg/dl, Na: 131 mmol/l, K: 5.6 mmol/l, Cl: 86 mmol/l, BUN: 74 mg/dl, creatinine: 1.93  mg/dl, GFR: 32 ml/min.  ? ?Medications reviewed; 25 mg albumin QID x3 days (5/10-5/12), 40 mg IV lasix/day, sliding scale novolog, 40 mg oral protonix BID, 1 tablet senokot BID, 60 g rectal kayexalate x1 dose 5/11. ?  ? ?NUTRITION - FOCUSED PHYSICAL EXAM: ? ?Defer.  ? ?Diet Order:   ?Diet Order   ? ?       ?  Diet heart healthy/carb modified Room service appropriate? Yes; Fluid consistency: Thin  Diet effective now       ?  ? ?  ?  ? ?  ? ? ?EDUCATION NEEDS:  ? ?No education needs have been identified at this time ? ?Skin:  Skin Assessment: Reviewed RN Assessment ? ?Last BM:  PTA/unknown ? ?Height:  ? ?Ht Readings from Last 1 Encounters:  ?09/26/21 5\' 10"  (1.778 m)  ? ? ?Weight:  ? ?Wt Readings from Last 1 Encounters:  ?10/06/2021 75.8 kg  ? ? ? ?BMI:  Body mass index is 23.98 kg/m?. ? ?Estimated Nutritional Needs:  ?Kcal:  1600-1850 kcal ?Protein:  80-90 grams ?Fluid:  >/= 1.7 L/day ? ? ? ? ?Jarome Matin, MS, RD, LDN ?Registered Dietitian II ?Inpatient Clinical Nutrition ?RD pager # and on-call/weekend pager # available in [place]  ? ?

## 2021-09-27 NOTE — Progress Notes (Signed)
?Progress Note ? ? ? ?Adam Briggs   ?WCB:[id number]  ?DOB: 1929/11/08  ?DOA: 10/13/2021     2 ?PCP: [name], MD ? ?Initial CC: SOB, swelling ? 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  Other history includes prostate cancer 2009 s/p radioactive seeds and now most recently concern for rectal cancer s/p partial colonoscopy with pathology showing intramucosal adenocarcinoma from 09/17/2021 specimen. ?He has been evaluated by oncology with plans for possible radiation treatment and potentially Xeloda.  He was not considered a good candidate for chemotherapy. ? ?He presented with essentially worsening functional status at home with associated weakness/debility and worsening shortness of breath along with worsening swelling in his extremities.  He is accompanied by his daughter to the hospital.  Goals of care have been discussed on admission and family is insistent on remaining aggressive with care however does agree to DNR/DNI.  They are also not ready for evaluation with palliative care and declined consultation with them during hospitalization.  Pulmonology has also been consulted given increased appearance of loculated right-sided pleural effusion on CXR. ? ?Interval History:  ?Seen in the ER this morning with daughter present bedside.  Goals of care discussion held also.  At this time they are declining palliative care involvement and wishing for aggressive medical measures but are amenable with DNR/DNI. ?Patient was able to answer some questions and was not in any distress. ? ?Assessment and Plan: ?* Acute on chronic respiratory failure with hypoxia (HCC) ?- Increased oxygen demand on admission, presumed from underlying worsening right-sided effusion and possible superimposed  pneumonia ?-Overnight of 09/26/2021, developed worsening respiratory status and transferred to stepdown.  CXR showed decreased pleural effusion volume but new moderate right pneumothorax (see separate problem) ?-continue oxygen and wean as able ?- Continue Lasix (worsening renal function and UOP however) ? ?Pneumothorax ?- Noted on CXR overnight of 09/26/2021 with associated worsening respiratory status at that time.  ABG also noted worsening of his hypercarbic respiratory failure.  He was placed on BiPAP overnight.  Repeat CXR this morning shows apparent interval increase of PNX as well.  ?- actively being managed and addressed per pulmonology ? ?Sepsis due to pneumonia ([place]) ?- tachycardia, tachypnea, leukocytosis.  CXR concerning for increased volume of loculated right pleural effusion as well as new opacity involving left lung base which could still be atelectasis versus infiltrate ?-MRSA screen negative, discontinue vancomycin ?- Continue cefepime ?- Hold off on anaerobic coverage as low concern for aspiration at this time ?-Trend procalcitonin ? ?Pleural effusion, right ?- Increased volume involving moderate loculated right pleural effusion.  Pleurx catheter has been placed March 2023 ?- Multiple samples taken from bronchoscopies and thoracenteses.  No malignant cells identified.  Cytology notes atypical cells with lymphocyte predominance ?- greatly appreciate pulmonology involvement  ?- continue Pleurx, management per pulmonology  ?-Continue Lasix ?-no fluid studies sent from pleurx currently  ? ?Goals of care, counseling/discussion ?- I have been having ongoing discussions with his daughter since admission.  He has had a progressive decline in regards to respiratory status and now renal function this morning.  Urine output also significantly decreasing along with worsening BUN, creatinine, and potassium.  He would not be an HD candidate. Family understands worsening status and prognosis at this time. They  would like to continue  treatment but main focus is making sure he were to not struggle from a breathing standpoint but they also understand nutrition and renal function could also begin to play roles going forward.  ?- family okay with Palliative care involvement at this time and cautious watchful waiting  ? ?AKI (acute kidney injury) ([place]) ?- baseline creatinine ~ 1 ?- patient presents with increase in creat >0.3 mg/dL above baseline, creat increase >1.5x baseline presumed to have occurred within past 7 days PTA ?-Renal function does not appear to be tolerating Lasix however unable to start fluids either in setting of volume overload; also was started on IV albumin on 5/10 as well. Etiology likely multifactorial but multiple reasons to be developing possible ATN (sepsis, hypotension/hypoperfusion, hypoxia) ?- discussed with his daughter and family bedside and they understand worsening clinical status at this time; plan for now is continuing current management and if patient were to worsen (notably respi status) then they would want the main focus to be on the patient being comfortable ? ?Edema ?- Patient has developed worsening edema in his arms and legs per family.  Overall, his nutrition has been poor at home and his hypoalbuminemia was discussed bedside with his daughter.  This is likely a consequence to his overall declining state. ?- in general, he is clinically worse today and family now aware of this and after more [place] discussions they are understanding that he may not survive hospitalization but we are continuing some medical measures for now to see if he improves  ?- continue strict I&O ?- continue lasix for now; monitor renal function; not an HD candidate  ? ?Rectal adenocarcinoma ([place]) ?- Was recently seen by oncology with tentative plans for radiation and possibly even Xeloda depending on his functional status which has now unfortunately declined ?- Intramucosal adenocarcinoma noted on surgical  pathology from 09/17/2021 of rectal mass biopsy ?- Overall clinical picture has been taken into consideration and currently he is not thriving well and has poor functional status; I do not think he'd withstand treatment or aggressive measures in general, but further discussion can be held with oncology after discharge (if survives); family now amenable to palliative care involvement ?- guarded prognosis in general but prognosis worsening today ? ?Malignant neoplasm of upper lobe of left lung (HCC) ?- presumed LUL stage 1a NSCLC s/p SBRT (completed 11/21/20 showing atypical cells on cytology) and ongoing recurrent/residual right pleural effusion (with negative cytology despite multiple samples since 2022) ?-CT chest from 06/23/2021 shows interval decrease (but still present) left apical pulmonary nodules (no liver lesions noted on follow up MRI abdomen) ? ?Hyperkalemia ?- Potassium and renal function worsening since yesterday and now urine output also decreasing ?- spironolactone discontinued on admission  ?-Repeat potassium trending upward this morning, 6.4 mmol/L around 11 AM ?- Give repeat insulin/D50, Kayexalate enema due to being on BiPAP currently, and calcium gluconate.  Repeat potassium later this afternoon ? ?Hyponatremia ?- in setting of underlying malignancy and poor oral intake ?- hold off on IVF in setting of almost anasarca appearance ?- continue albumin and lasix ?- BMP in am  ? ?Protein-calorie malnutrition, moderate ([place]) ?- Ongoing hypoalbuminemia likely from his ongoing poor nutritional status ?-Check prealbumin ?-Dietitian consult requested ? ?Atrial fibrillation ([place]) ?- continue lopressor ?-No longer on Eliquis per family ? ?Essential hypertension ?- Hold home medications for now in setting of presumed sepsis ?- continue lopressor for now in setting of RVR overnight, but if further BP drop, will have to hold ?-Continue Lasix ? ?  Type 2 diabetes mellitus with hyperlipidemia ([place]) ?- continue SSI   ? ? ? ?Old records reviewed in assessment of this patient ? ?Antimicrobials: ?Vancomycin 10/17/2021 x 1 ?Zosyn 09/30/2021 x 1 ?Cefepime 10/04/2021 >> current ? ?DVT prophylaxis:  ?SCDs Start: 09/19/2021 1750 ? ? ?Code Status:   Code

## 2021-09-27 NOTE — Consult Note (Signed)
? ?                                                                                ?Consultation Note ?Date: 10/05/2021  ? ?Patient Name: Adam Briggs  ?DOB: Mar 22, 1930  MRN: [id number]  Age / Sex: 88 y.o., male  ?PCP: [name], MD ?Referring Physician: Dwyane Dee, MD ? ?Reason for Consultation: Establishing goals of care ? ?HPI/Patient Profile: 88 y.o. male  with past medical history of stage 1A NSCLC s/p radiation therapy, chronic right-sided loculated effusion with history of pneumothorax, atrial fibrillation, diabetes, moderate aortic stenosis, HTN, HLD, colon diverticulosis, GERD. Admitted 4/29-5/2 GIB secondary to new rectal mass adenocarcinoma admitted on 10/10/2021 with worsening weakness and dyspnea for 5 days. Recurrent right loculated pleural effusion s/p tPa 5/10. Further respiratory decline 5/11 early am requiring transfer to ICU for BiPAP.  ? ?Clinical Assessment and Goals of Care: ?I have reviewed records and spoken with bedside RN. I met with daughter [name], son Reyne Dumas, and granddaughter. We reviewed Mr. Filippi declining illness and complications over the past weeks and months. [name] shares that she just retired March 1st and luckily has been able to be there to assist her father through these illnesses. They shares how strong and resilient he has been through with surviving multiple cancers. Given his resilience they have had a hard time with his acute decline but express that they they know and understand now that he continues to decline and is worsening. We reviewed his worsening kidney function and leukocytosis. Dr. Loanne Drilling came and reviewed with them that the chest tube is not functioning and if they wished to continue pushing forward that he would need a new chest tube placed. Family decide at this point that they do not want to put him through any more. They do not want him to suffer.  ? ?We discussed goals of care moving forward. They wish to continue current measures for  today to provide time for family to gather and visit. If he declines abruptly (they do anticipate that he will decline further and will pass away from this illness) they want to ensure that he is comfortable and not suffer. Plans for likely transition to full comfort care in the morning or over night if he decompensates further. He has expressed to them desire for DNR and that he is not afraid to die but he does not want to struggle to breathe. I spent time discussing with them the use of morphine to alleviate shortness of breath. They describe to me their experience at hospice with their mother and concerns and we discussed how we give medication to ensure comfort based on symptoms. We start at low dose and increase to what he needs to give him relief. Family understand and agree with PRN medication to be in place if needed. They shared loving stories of their father and his strong faith. They express that they find peace through their faith.  ? ?We returned to the room to find Mr. Carreon is pulling at BiPAP. This was readjusted but he could not get comfortable. We took off BiPAP and placed on nasal cannula and he reports that he could breathe much  better and felt like he was "smothering" with BiPAP on. I spoke with [name] with my recommendation to utilize nasal cannula oxygen and comfort medication to provide him relief but to avoid BiPAP at this stage since it is making him uncomfortable. Reyne Dumas stepped away so they will discuss further about if they would desire to utilize BiPAP again if he decompensates.  ? ?All questions/concerns addressed. Emotional support provided.  ? ?Primary Decision Maker ?NEXT OF KIN 2 adult children ?  ? ?SUMMARY OF RECOMMENDATIONS   ?- DNR ?- Comfort is focus of care ?- Full comfort care if he declines further ?- Comfort medication available as needed ?- Continue supportive care through the day to allow for family visitation ? ?Code Status/Advance Care Planning: ?DNR ? ? ?Symptom  Management:  ?PRN medication available as needed to minimize suffering.  ? ?Palliative Prophylaxis:  ?Aspiration, Bowel Regimen, Delirium Protocol, Frequent Pain Assessment, Oral Care, and Turn Reposition ? ?Prognosis:  ?Prognosis poor. Likely days.  ? ?Discharge Planning: Anticipated Hospital Death  ? ?  ? ?Primary Diagnoses: ?Present on Admission: ? Essential hypertension ? Type 2 diabetes mellitus with hyperlipidemia ([place]) ? Pleural effusion, right ? Atrial fibrillation ([place]) ? Acute on chronic respiratory failure with hypoxia (HCC) ? Other cirrhosis of liver ([place]) ? Malignant neoplasm of upper lobe of left lung ([place]) ? Hyponatremia ? Protein-calorie malnutrition, moderate ([place]) ? Edema ? ? ?I have reviewed the medical record, interviewed the patient and family, and examined the patient. The following aspects are pertinent. ? ?Past Medical History:  ?Diagnosis Date  ? Atrial fibrillation ([place])   ? BENIGN PROSTATIC HYPERTROPHY   ? Cataract   ? DIVERTICULOSIS, COLON   ? DM   ? not diabetic-has been off metformin for about 2 years as of 09/2020 pr daughter  ? GERD   ? Heart murmur   ? Hernia   ? HYPERCHOLESTEROLEMIA   ? HYPERTENSION   ? Lung cancer ([place])   ? Moderate aortic stenosis 10/30/2011  ? echo 2013  ? Prostate cancer [facility])   ? s/p seed implant  ? Recurrent right pleural effusion   ? ?Social History  ? ?Socioeconomic History  ? Marital status: Widowed  ?  Spouse name: Not on file  ? Number of children: 2  ? Years of education: Not on file  ? Highest education level: Not on file  ?Occupational History  ? Occupation: Retired   ?  Comment: Retired  ?Tobacco Use  ? Smoking status: Former  ?  Packs/day: 0.50  ?  Years: 35.00  ?  Pack years: 17.50  ?  Types: Cigarettes  ?  Quit date: 10/30/1966  ?  Years since quitting: 54.9  ? Smokeless tobacco: Never  ?Vaping Use  ? Vaping Use: Never used  ?Substance and Sexual Activity  ? Alcohol use: No  ? Drug use: No  ? Sexual activity: Not on file  ?Other Topics Concern  ? Not  on file  ?Social History Narrative  ? Pt says his diet is excellent  ? Daily caffeine   ? ?Social Determinants of Health  ? ?Financial Resource Strain: Low Risk   ? Difficulty of Paying Living Expenses: Not hard at all  ?Food Insecurity: No Food Insecurity  ? Worried About Charity fundraiser in the Last Year: Never true  ? Ran Out of Food in the Last Year: Never true  ?Transportation Needs: No Transportation Needs  ? Lack of Transportation (Medical): No  ? Lack of Transportation (Non-Medical):  No  ?Physical Activity: Inactive  ? Days of Exercise per Week: 0 days  ? Minutes of Exercise per Session: 0 min  ?Stress: No Stress Concern Present  ? Feeling of Stress : Not at all  ?Social Connections: Socially Isolated  ? Frequency of Communication with Friends and Family: More than three times a week  ? Frequency of Social Gatherings with Friends and Family: Once a week  ? Attends Religious Services: Never  ? Active Member of Clubs or Organizations: No  ? Attends Archivist Meetings: Never  ? Marital Status: Widowed  ? ?Family History  ?Problem Relation Age of Onset  ? Heart attack Paternal Uncle   ?     in his 72s  ? Atrial fibrillation Daughter   ? Colon cancer Neg Hx   ? ?Scheduled Meds: ? alteplase (TPA) for intrapleural administration  10 mg Intrapleural Once  ? And  ? pulmozyme (DORNASE) for intrapleural administration  5 mg Intrapleural Once  ? Chlorhexidine Gluconate Cloth  6 each Topical Daily  ? furosemide  40 mg Intravenous Daily  ? insulin aspart  0-9 Units Subcutaneous TID WC  ? levalbuterol  1.25 mg Nebulization Q6H  ? metoprolol tartrate  25 mg Oral BID  ? pantoprazole  40 mg Oral BID  ? senna-docusate  1 tablet Oral BID  ? sodium chloride flush  10 mL Other Q8H  ? sodium chloride flush  10 mL Other Q8H  ? sodium chloride flush  10 mL Other Q8H  ? sodium chloride HYPERTONIC  4 mL Nebulization BID  ? tamsulosin  0.4 mg Oral Daily  ? ?Continuous Infusions: ? albumin human 25 g (10/02/2021 0832)  ?  ceFEPime (MAXIPIME) IV 2 g (10/08/2021 1053)  ? ?PRN Meds:.acetaminophen **OR** acetaminophen, morphine injection, ondansetron **OR** ondansetron (ZOFRAN) IV, polyethylene glycol ?Allergies  ?Allergen Reactions  ? L

## 2021-09-27 NOTE — Assessment & Plan Note (Signed)
-   Noted on CXR overnight of 09/26/2021 with associated worsening respiratory status at that time.  ABG also noted worsening of his hypercarbic respiratory failure.  He was placed on BiPAP overnight.  Repeat CXR this morning shows apparent interval increase of PNX as well.  ?- actively being managed and addressed per pulmonology ?

## 2021-09-27 NOTE — Progress Notes (Signed)
OT Cancellation Note ? ?Patient Details ?Name: CHANDLAR GUICE ?MRN: [id number] ?DOB: 01-Apr-1930 ? ? ?Cancelled Treatment:    Reason Eval/Treat Not Completed: Medical issues which prohibited therapy Patient transferred to ICU after rapid response called, on Bipap. Will check back for GOC. ? ?Delbert Phenix OT ?OT pager: [phone] ? ? ?Rosemary Holms ?10/08/2021, 7:31 AM ?

## 2021-09-27 NOTE — Assessment & Plan Note (Signed)
-   baseline creatinine ~ 1 ?- patient presents with increase in creat >0.3 mg/dL above baseline, creat increase >1.5x baseline presumed to have occurred within past 7 days PTA ?-Renal function does not appear to be tolerating Lasix however unable to start fluids either in setting of volume overload; also was started on IV albumin on 5/10 as well. Etiology likely multifactorial but multiple reasons to be developing possible ATN (sepsis, hypotension/hypoperfusion, hypoxia) ?- discussed with his daughter and family bedside and they understand worsening clinical status at this time; plan for now is continuing current management and if patient were to worsen (notably respi status) then they would want the main focus to be on the patient being comfortable ?

## 2021-09-27 NOTE — Progress Notes (Signed)
eLink Physician-Brief Progress Note ?Patient Name: Adam Briggs ?DOB: 1930-05-11 ?MRN: [id number] ? ? ?Date of Service ? 09/19/2021  ?HPI/Events of Note ? 89/M with chronic R sided effusion s/p PleurX cathter placement admitted on 10/01/2021 for progressive shortness of breath. Overnight, RR was called as pt had been noted to be dyspneic. He was placed on BIPAP and transferred to the ICU.   ?eICU Interventions ? - Transfer to ICU ?- Pt tolerating BIPAP, maintaing SpO2 >90% ?- Recheck CXR, ABG, will make further adjustments on BIPAP as warranted.  ?- Continue with antibioics. Follow cultures.  ?- Continue diuretic therapy. Monitor I/Os daily weights.  ?- Code status DNR  ? ? ? ?  ? ?[place] ?09/30/2021, 3:02 AM ?

## 2021-09-27 NOTE — Progress Notes (Signed)
Patient was having difficulty Breathing, and SOB. I called Rapid response Nurse to assess. Per assessment, the Doctor ordered for patient to be transferred to the ICU. Report given to ICU Nurse.  ?

## 2021-09-27 NOTE — Progress Notes (Signed)
Pt pronounced deceased by 2 Rns; Eliberto Ivory, RN & Bethanne Ginger, RN. ? ?No active breath sounds or cardiac sound noted. Pt remains unresponsive.  ? ?Family present; pt son & daughter. ? ?Pt belongings packed and taken by pt daughter [name]). No valuables left in room or on pt's body. ? ?Pt will be taken to the morgue for pickup. ?

## 2021-09-27 NOTE — Progress Notes (Signed)
PHARMACY NOTE:  ANTIMICROBIAL RENAL DOSAGE ADJUSTMENT ? ?Current antimicrobial regimen includes a mismatch between antimicrobial dosage and estimated renal function.  As per policy approved by the Pharmacy & Therapeutics and Medical Executive Committees, the antimicrobial dosage will be adjusted accordingly. ? ?Current antimicrobial dosage:  cefepime 2 g IV q12h ? ?Indication: pneumonia ? ?Renal Function: ? ?Estimated Creatinine Clearance: 26.3 mL/min (A) (by C-G formula based on SCr of 1.93 mg/dL (H)). ? ?   ?Antimicrobial dosage has been changed to:  cefepime 2 g IV q24h ? ? ? ?Thank you for allowing pharmacy to be a part of this patient's care. ? ?Tawnya Crook, PharmD, BCPS ?Clinical Pharmacist ?09/17/2021 2:55 PM ? ?

## 2021-09-27 NOTE — Progress Notes (Signed)
Pt placed on Bipap per order Post ABG. ?

## 2021-09-27 NOTE — Progress Notes (Signed)
? ? ?  OVERNIGHT PROGRESS REPORT ? ?Notified by RN that patient has expired at 2237 Hrs.   ? ?Patient was DNR/comfort care Followed by Palliative  ? ?2 RN verified. ? ?Family was immediately available to RN at bedside. ? ? ? ?Gershon Cull MSNA ACNPC-AG ?Acute Care Nurse Practitioner ?[facility] ?[place] ? ?

## 2021-09-27 NOTE — Significant Event (Signed)
Rapid Response Event Note  ? ?Reason for Call :  ?Respiratory distress ? ?Initial Focused Assessment:  ?Patient responsive to hearing his name, but unable to converse, answer questions, or follow commands. Patient with labored breathing including accessory muscle use and significant diminished lung sounds. Patient already on chest tube with sahara drain to -20 cm suction. Patient has evacuated 550 ml serosanguinous fluid this shift. Patient has significant 2-3+ edema peripherally x 4. Patient has irregular heart beat, a-fib on monitor with variable rate up to 120's. Daughter at bedside confirms that patient DNR/DNI, but wants full treatment otherwise.  ? ?Interventions:  ?Assessment completed, reviewed chart, contacted respiratory therapist, on call provider, and discussed care goals with daughter. ABG completed and when resulted, transfer initiated to start on BIPAP. Explained expectations and unknowns with daughter.  ? ?Plan of Care:  ?Moved to 1222, stepdown. Made critical care team aware. Started BIPAP. Daughter at bedside, will allow other family to come to bedside in case events continue to progress negatively. ? ? ?Event Summary:  ? ?MD Notified: Clarene [place], NP ?Call Time: 0215 ?Arrival Time: 0300 ?End Time: 1464 ? ?Selinda Michaels, RN ?

## 2021-09-28 ENCOUNTER — Inpatient Hospital Stay: Payer: Medicare Other | Admitting: Hematology

## 2021-09-28 LAB — CULTURE, RESPIRATORY W GRAM STAIN
Culture: NORMAL
Gram Stain: NONE SEEN

## 2021-09-30 LAB — CULTURE, BLOOD (ROUTINE X 2)
Culture: NO GROWTH
Culture: NO GROWTH

## 2021-10-18 ENCOUNTER — Ambulatory Visit: Payer: Medicare Other | Admitting: Pulmonary Disease

## 2021-10-18 NOTE — Discharge Summary (Signed)
?Death Summary  ?Adam Briggs TFT:[id number] DOB: [birth year] DOA: 2021-10-19 ? ?PCP: [name], MD ? ?Admit date: 2021-10-19 ?Date of Death: Oct 21, 2021 ?Time of Death: Aug 21, 2235 ?Notification: Family notified of death ? ? ?History of present illness:  ?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  Other history includes prostate cancer 2007/08/21 s/p radioactive seeds and now most recently concern for rectal cancer s/p partial colonoscopy with pathology showing intramucosal adenocarcinoma from 09/17/2021 specimen. ?He has been evaluated by oncology with plans for possible radiation treatment and potentially Xeloda.  He was not considered a good candidate for chemotherapy. ? ?He presented with essentially worsening functional status at home with associated weakness/debility and worsening shortness of breath along with worsening swelling in his extremities.  Multiple goals of care discussions were held with family on admission and with palliative care after admission.  ?He continued to have ongoing decline after admission with worsening respiratory status. Pulmonology also was consulted for Pleurx management and patient was trialed on placing tubing to suction with no significant improvement either. He also developed a pneumothorax and family declined additional chest tube placement given the progressive decline.  ?Ultimately, the focus was shifted towards comfort as patient was not expected to improve, especially with further underlying organ failure including renal failure ensuing.  ?With ongoing comfort efforts, patient had further decline and passed naturally on 2021/10/21 at 21-Aug-2235. ? ?Final Diagnoses:  ?Acute hypoxic hypercarbic respiratory failure ?Right pneumothorax ?Severe sepsis due to pneumonia ?Rectal  adenocarcinoma ?Large right loculated pleural effusion ?Acute kidney injury  ? ? ?The results of significant diagnostics from this hospitalization (including imaging, microbiology, ancillary and laboratory) are listed below for reference.   ? ?Significant Diagnostic Studies: ?CT HEAD WO CONTRAST (5MM) ? ?Result Date: 09/18/2021 ?CLINICAL DATA:  Mental status change, unknown cause. EXAM: CT HEAD WITHOUT CONTRAST TECHNIQUE: Contiguous axial images were obtained from the base of the skull through the vertex without intravenous contrast. RADIATION DOSE REDUCTION: This exam was performed according to the departmental dose-optimization program which includes automated exposure control, adjustment of the mA and/or kV according to patient size and/or use of iterative reconstruction technique. COMPARISON:  None Available. FINDINGS: Brain: There is no evidence of an acute infarct, intracranial hemorrhage, mass, midline shift, or extra-axial fluid collection. Hypodensities in the cerebral white matter bilaterally are nonspecific but compatible with mild chronic small vessel ischemic disease. Mild cerebral atrophy is within normal limits for age. Vascular: Calcified atherosclerosis at the skull base. No hyperdense vessel. Skull: No fracture or suspicious osseous lesion. Sinuses/Orbits: Visualized paranasal sinuses and mastoid air cells are clear. Bilateral cataract extraction. Other: None. IMPRESSION: 1. No evidence of acute intracranial abnormality. 2. Mild chronic small vessel ischemic disease. Electronically Signed   By: Logan Bores M.D.   On: 09/18/2021 11:31  ? ?CT ABDOMEN PELVIS W CONTRAST ? ?Result Date: 09/15/2021 ?CLINICAL DATA:  Suspected bowel obstruction. EXAM: CT ABDOMEN AND PELVIS WITH CONTRAST TECHNIQUE: Multidetector CT imaging of the abdomen and pelvis was performed using the standard protocol following bolus administration of intravenous contrast. RADIATION DOSE REDUCTION: This exam was performed according to  the departmental dose-optimization program which includes automated exposure control, adjustment of the mA and/or kV according to patient size and/or use of iterative reconstruction technique. CONTRAST:  150mL OMNIPAQUE IOHEXOL 300 MG/ML  SOLN COMPARISON:  July 17, 2007 FINDINGS: Lower chest: Moderate to marked severity areas of linear scarring and/or atelectasis are seen within the lingular region, bilateral lower lobes and visualized portion of the right middle lobe. Large areas of consolidation are seen within the posterior and lateral aspects of the right lower lobe. There are small bilateral pleural effusions with a loculated component seen on the right. This contains a surgical drain which enters via the posterolateral aspect of the lower right chest wall. Hepatobiliary: Several well-defined areas of parenchymal low attenuation are seen within the right lobe of the liver. The largest measures approximately 1.7 cm x 1.0 cm. No gallstones, gallbladder wall thickening, or biliary dilatation. Pancreas: Unremarkable. No pancreatic ductal dilatation or surrounding inflammatory changes. Spleen: Normal in size without focal abnormality. Adrenals/Urinary Tract: Adrenal glands are unremarkable. Kidneys are normal in size, without renal calculi or hydronephrosis. A 1.2 cm simple cyst is seen within the posterior aspect of the mid right kidney. A similar appearing 3.2 cm simple cyst is seen within the lower pole of the left kidney. No additional follow-up or imaging is recommended. Bladder is unremarkable. Stomach/Bowel: Stomach is within normal limits. Appendix appears normal. No evidence of bowel wall thickening, distention, or inflammatory changes. A large noninflamed diverticulum is seen along the junction of the distal descending and proximal sigmoid colon. Vascular/Lymphatic: Aortic atherosclerosis. No enlarged abdominal or pelvic lymph nodes. Reproductive: Multiple prostate radiation implantation seeds are  seen. Other: No abdominal wall hernia or abnormality. No abdominopelvic ascites. Musculoskeletal: Multilevel degenerative changes are seen throughout the lumbar spine. IMPRESSION: 1. Moderate to marked severity post lingular, bilateral lower lobe and right middle lobe linear scarring and/or atelectasis. 2. Large areas of consolidation within the posterior and lateral aspects of the right lower lobe which may represent pneumonia. Follow-up to resolution is recommended, as an underlying neoplastic process cannot be excluded. 3. Small bilateral pleural effusions with a loculated component and associated pleural drainage catheter seen on the right. 4. Findings which may represent small hepatic cysts versus hemangiomas. Correlation with non emergent hepatic ultrasound is recommended. 5. Large noninflamed diverticulum along the junction of the distal descending and proximal sigmoid colon. 6. Multiple prostate radiation implantation seeds. 7. Aortic atherosclerosis. Aortic Atherosclerosis (ICD10-I70.0). Electronically Signed   By: Virgina Norfolk M.D.   On: 09/15/2021 22:26  ? ?DG CHEST PORT 1 VIEW ? ?Result Date: 09/30/2021 ?CLINICAL DATA:  Pneumothorax EXAM: PORTABLE CHEST 1 VIEW COMPARISON:  Chest x-ray dated Sep 27, 2021 FINDINGS: Unchanged cardiac and mediastinal contours. Stable moderate right hydropneumothorax with chest tube in place. Unchanged heterogeneous opacities of the right hemithorax. Similar small right pleural effusion. IMPRESSION: Stable size of moderate right hydropneumothorax. Chest tube in place. Electronically Signed   By: Yetta Glassman M.D.   On: 09/18/2021 13:18  ? ?DG CHEST PORT 1 VIEW ? ?Result Date: 09/24/2021 ?CLINICAL DATA:  Right pneumothorax EXAM: PORTABLE CHEST 1 VIEW COMPARISON:  Previous studies including the examination done earlier today FINDINGS: There is increase in size of right pneumothorax. Atelectasis is seen in the right lung. There is small to moderate right pleural effusion.  Right chest tube is noted with its tip in the medial right mid lung fields. There is interval decrease in small left pleural effusion. There is improvement in aeration of left parahilar region. IMPRESSION: There is increas

## 2021-10-18 DEATH — deceased

## 2021-10-24 ENCOUNTER — Other Ambulatory Visit (HOSPITAL_COMMUNITY): Payer: Medicare Other

## 2021-10-30 ENCOUNTER — Ambulatory Visit: Payer: Medicare Other | Admitting: Cardiology

## 2021-11-30 ENCOUNTER — Encounter: Payer: Medicare Other | Admitting: Internal Medicine

## 2021-12-17 ENCOUNTER — Telehealth: Payer: Medicare Other

## 2022-09-23 IMAGING — DX DG CHEST 2V
2 series · 2 of 2 positions shown · non-contrast
Comparison: September 25, 2020
COMPARISON: September 25, 2020

Addendum:
CLINICAL DATA: Pleural effusion

EXAM:
CHEST - 2 VIEW

[chest lat]
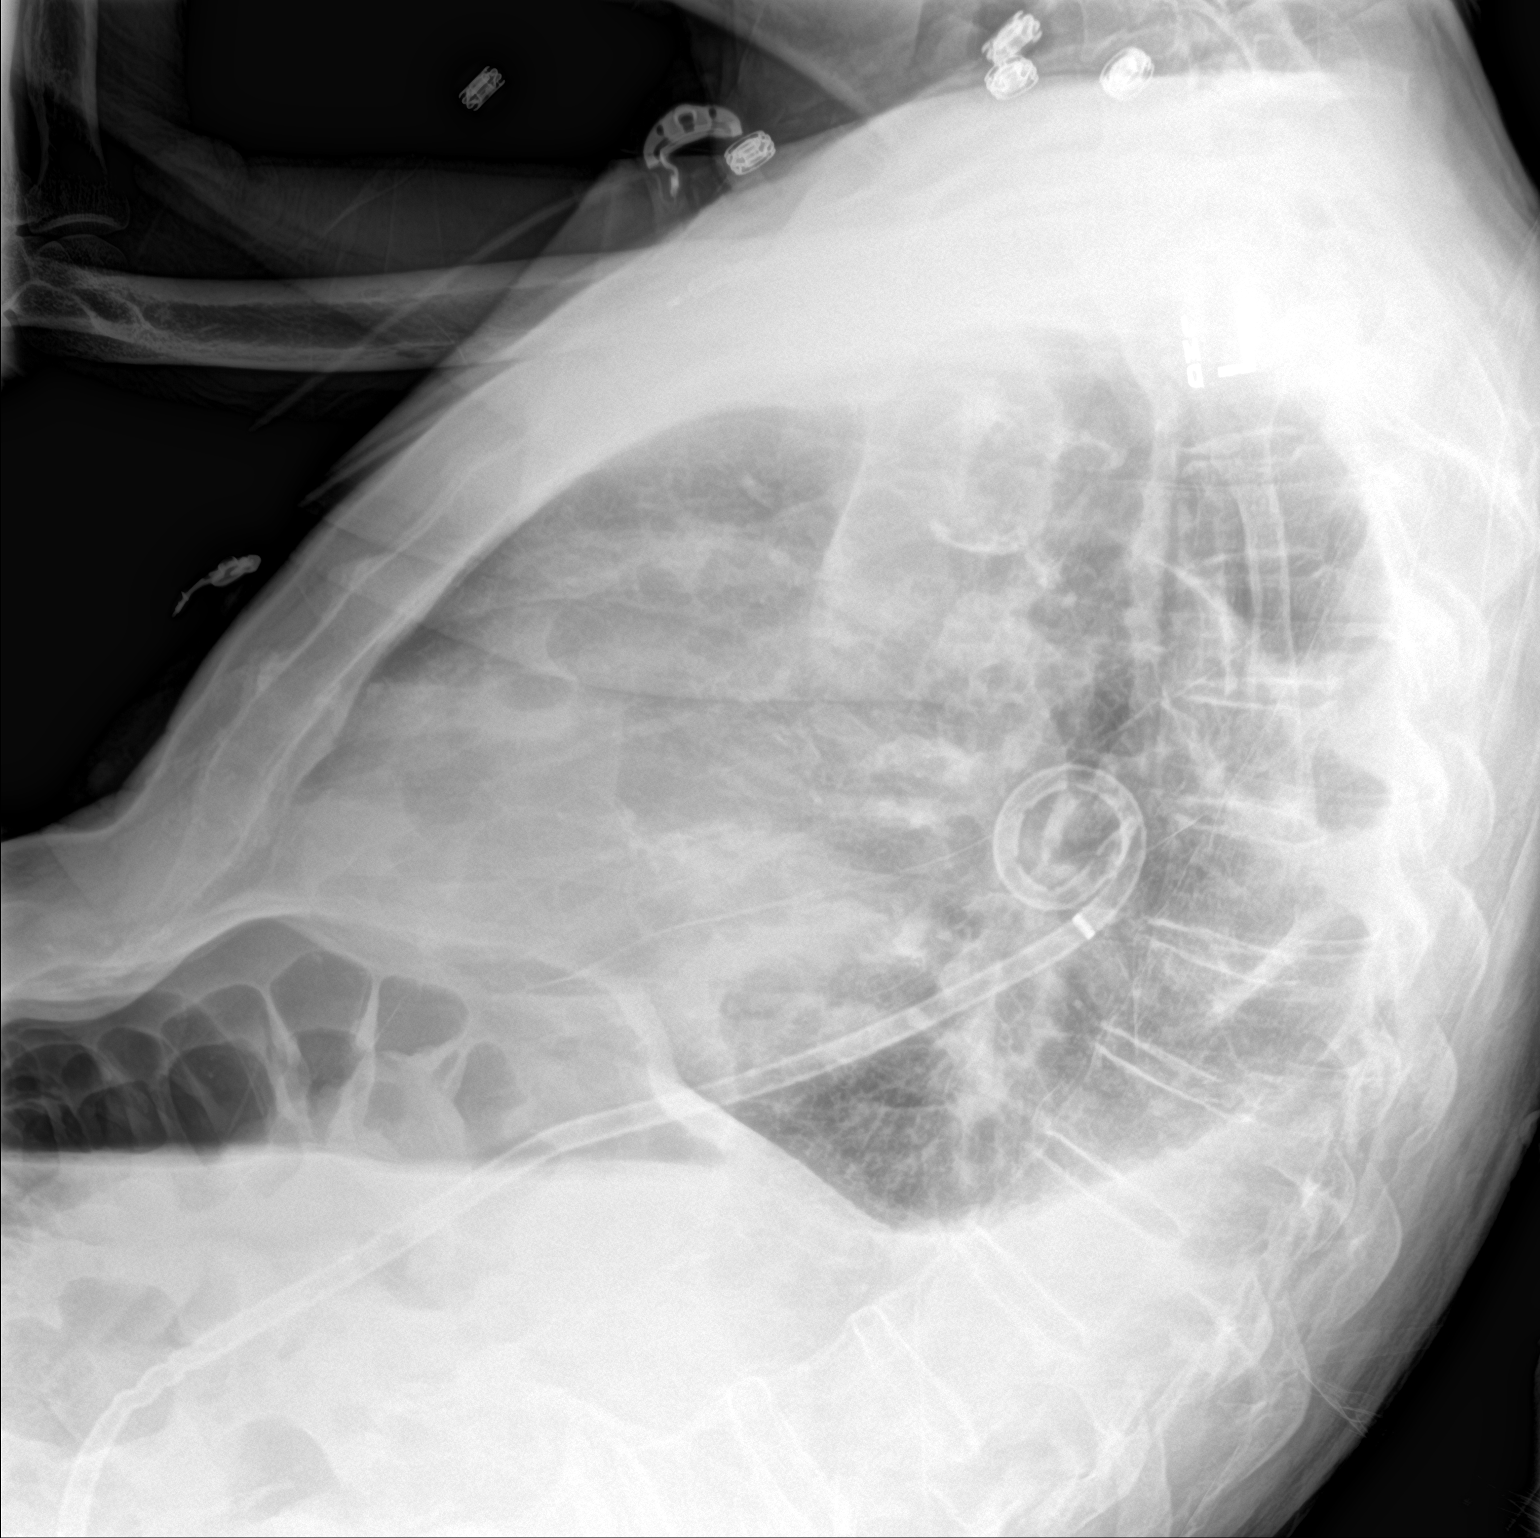

[chest ap]
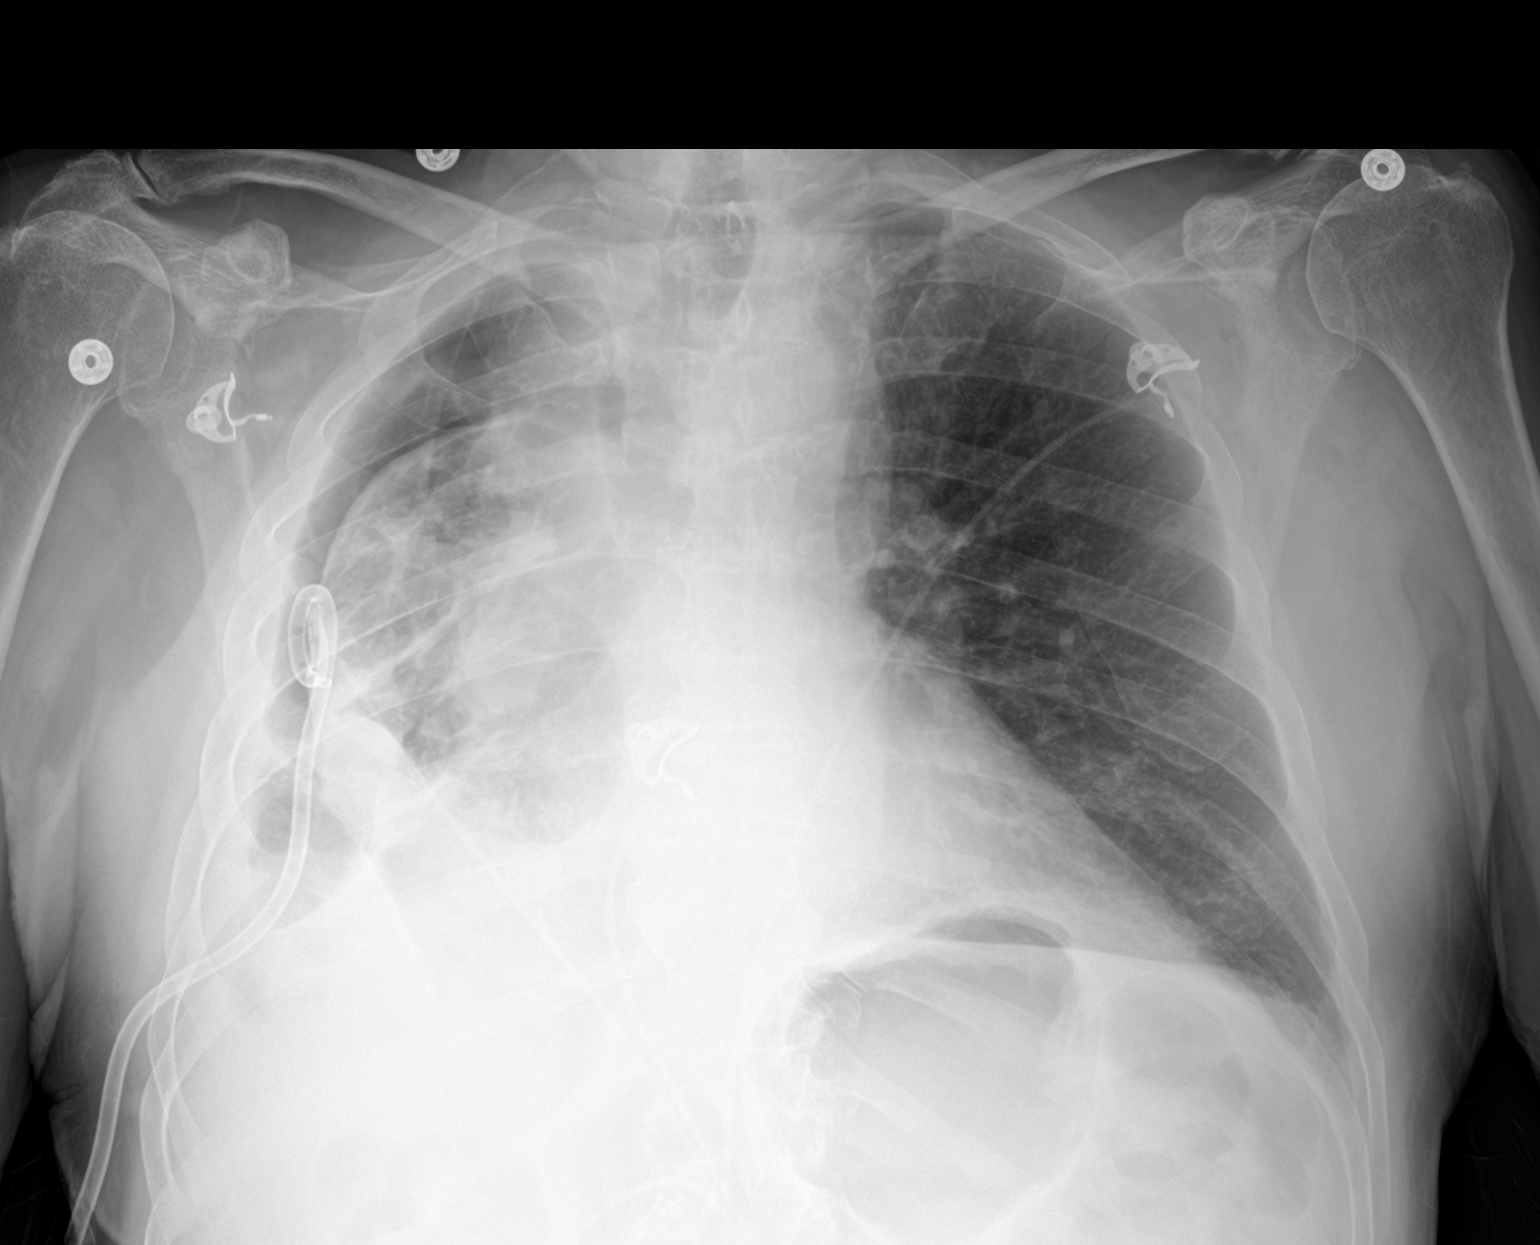

[2 of 2 positions shown; findings below may reference images not displayed]

FINDINGS: Chest tube on the right is again noted with pneumothorax on the
right larger than 1 day prior. Loculated pleural effusion in the
right base noted. There is atelectatic change in the right lung.
Left lung is clear. Heart is upper normal in size. The pulmonary
vascularity on the left is normal. Vascularity on the right is
somewhat distorted due to pneumothorax and atelectasis. No
adenopathy evident. No bone lesions.
IMPRESSION: Pneumothorax on the right has increased in size without tension
component. Loculated pleural effusion again noted on the right with
areas of atelectatic change in the right lung. Left lung is clear.
Stable cardiac silhouette.

ADDENDUM:
Critical Value/emergent results were called by telephone at the time
of interpretation on 09/26/2020 at [DATE] to provider HAM DI TRAX ,
who verbally acknowledged these results.

*** End of Addendum ***
FINDINGS: Chest tube on the right is again noted with pneumothorax on the
right larger than 1 day prior. Loculated pleural effusion in the
right base noted. There is atelectatic change in the right lung.
Left lung is clear. Heart is upper normal in size. The pulmonary
vascularity on the left is normal. Vascularity on the right is
somewhat distorted due to pneumothorax and atelectasis. No
adenopathy evident. No bone lesions.
IMPRESSION: Pneumothorax on the right has increased in size without tension
component. Loculated pleural effusion again noted on the right with
areas of atelectatic change in the right lung. Left lung is clear.
Stable cardiac silhouette.

## 2022-09-23 IMAGING — DX DG CHEST 1V PORT
1 series · 1 of 1 positions shown · non-contrast
Comparison: Chest radiograph performed the same day.

CLINICAL DATA: Right-sided pneumothorax with a chest tube in place.

EXAM:
PORTABLE CHEST 1 VIEW

[chest ap]
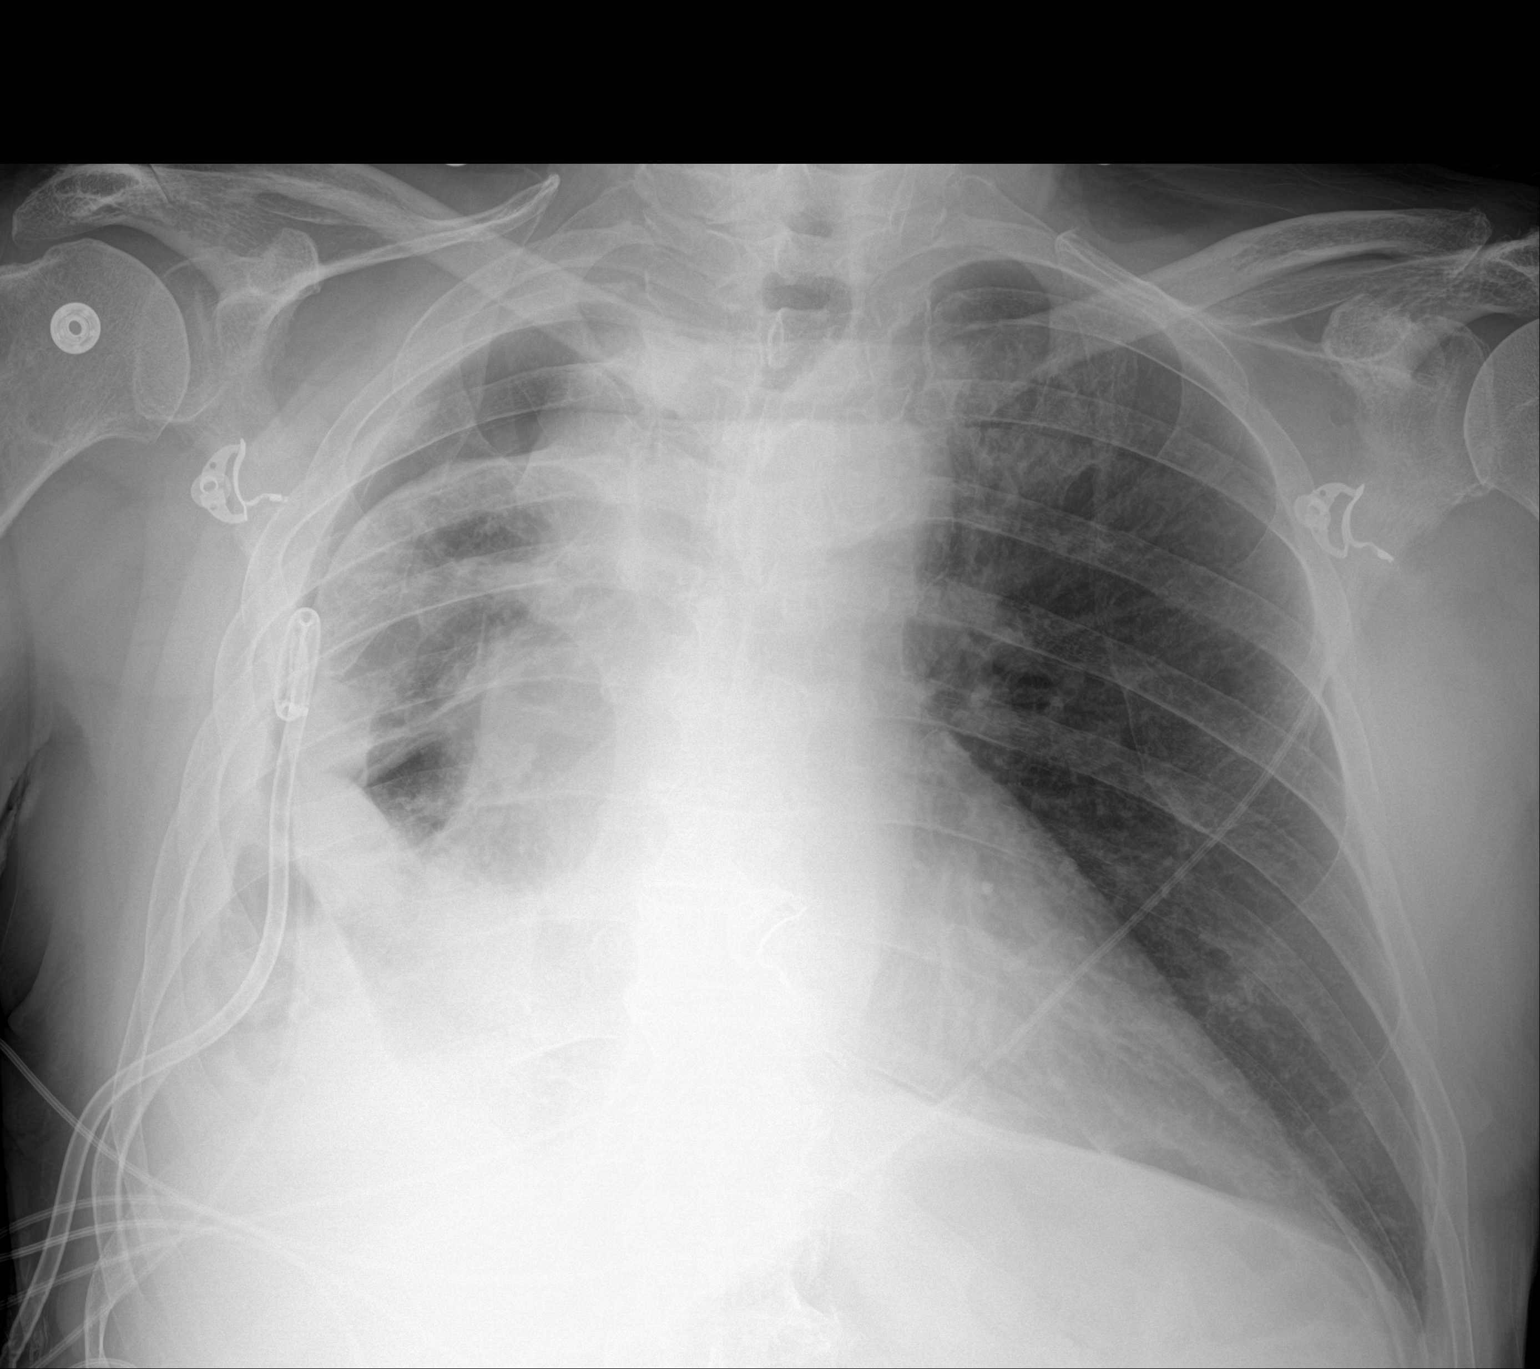

[1 of 1 positions shown; findings below may reference images not displayed]

FINDINGS: A moderate right pneumothorax appears slightly decreased from prior
exam. A right-sided pleural pigtail catheter is unchanged in
position. Loculated pleural fluid on the right appears unchanged.
There is associated atelectasis/airspace disease which is unchanged.
The left lung is clear. There is no left pleural effusion or
pneumothorax. The cardiac silhouette is obscured and appears
unchanged.
IMPRESSION: Slightly decreased moderate right pneumothorax.

## 2022-09-24 IMAGING — DX DG CHEST 1V PORT
1 series · 1 of 1 positions shown · non-contrast
Comparison: 09/27/2020, 09/26/2020 CT 09/20/2020

CLINICAL DATA: Chest tube removal

EXAM:
PORTABLE CHEST 1 VIEW

[chest ap]
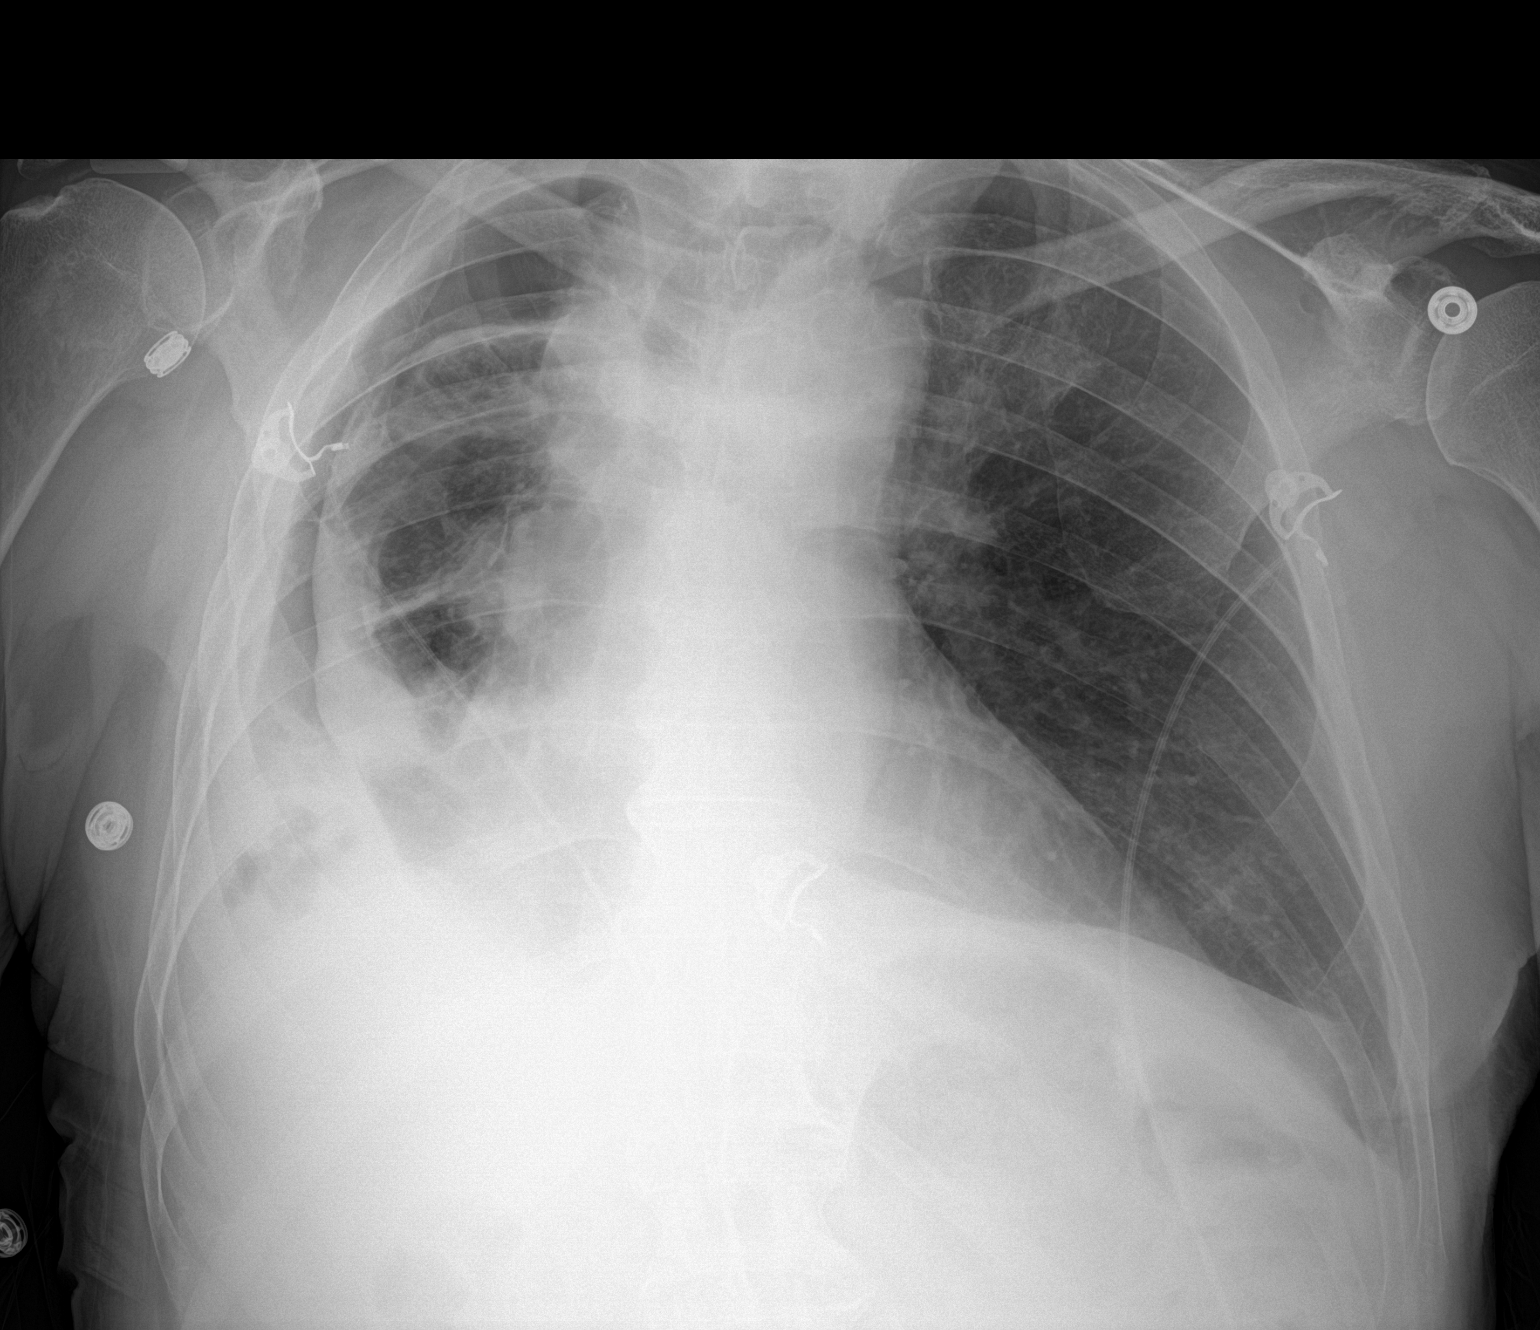

[1 of 1 positions shown; findings below may reference images not displayed]

FINDINGS: Removal of right-sided chest tube. Grossly stable moderate size
right hydropneumothorax. Airspace disease within the right lung
parenchyma. Small left-sided effusion with improved aeration left
base. Stable cardiomediastinal silhouette.
IMPRESSION: 1. Removal of right chest tube with grossly similar size of moderate
right hydropneumothorax. Atelectasis and airspace disease of the
right lung.
2. Small left effusion with slightly improved aeration at left lung
base.

## 2022-09-25 IMAGING — DX DG CHEST 2V
2 series · 2 of 2 positions shown · non-contrast
Comparison: Portable exam [id number] hours compared to 09/27/2020

CLINICAL DATA: Prostate cancer, shortness of breath

EXAM:
CHEST - 2 VIEW

[chest pa]
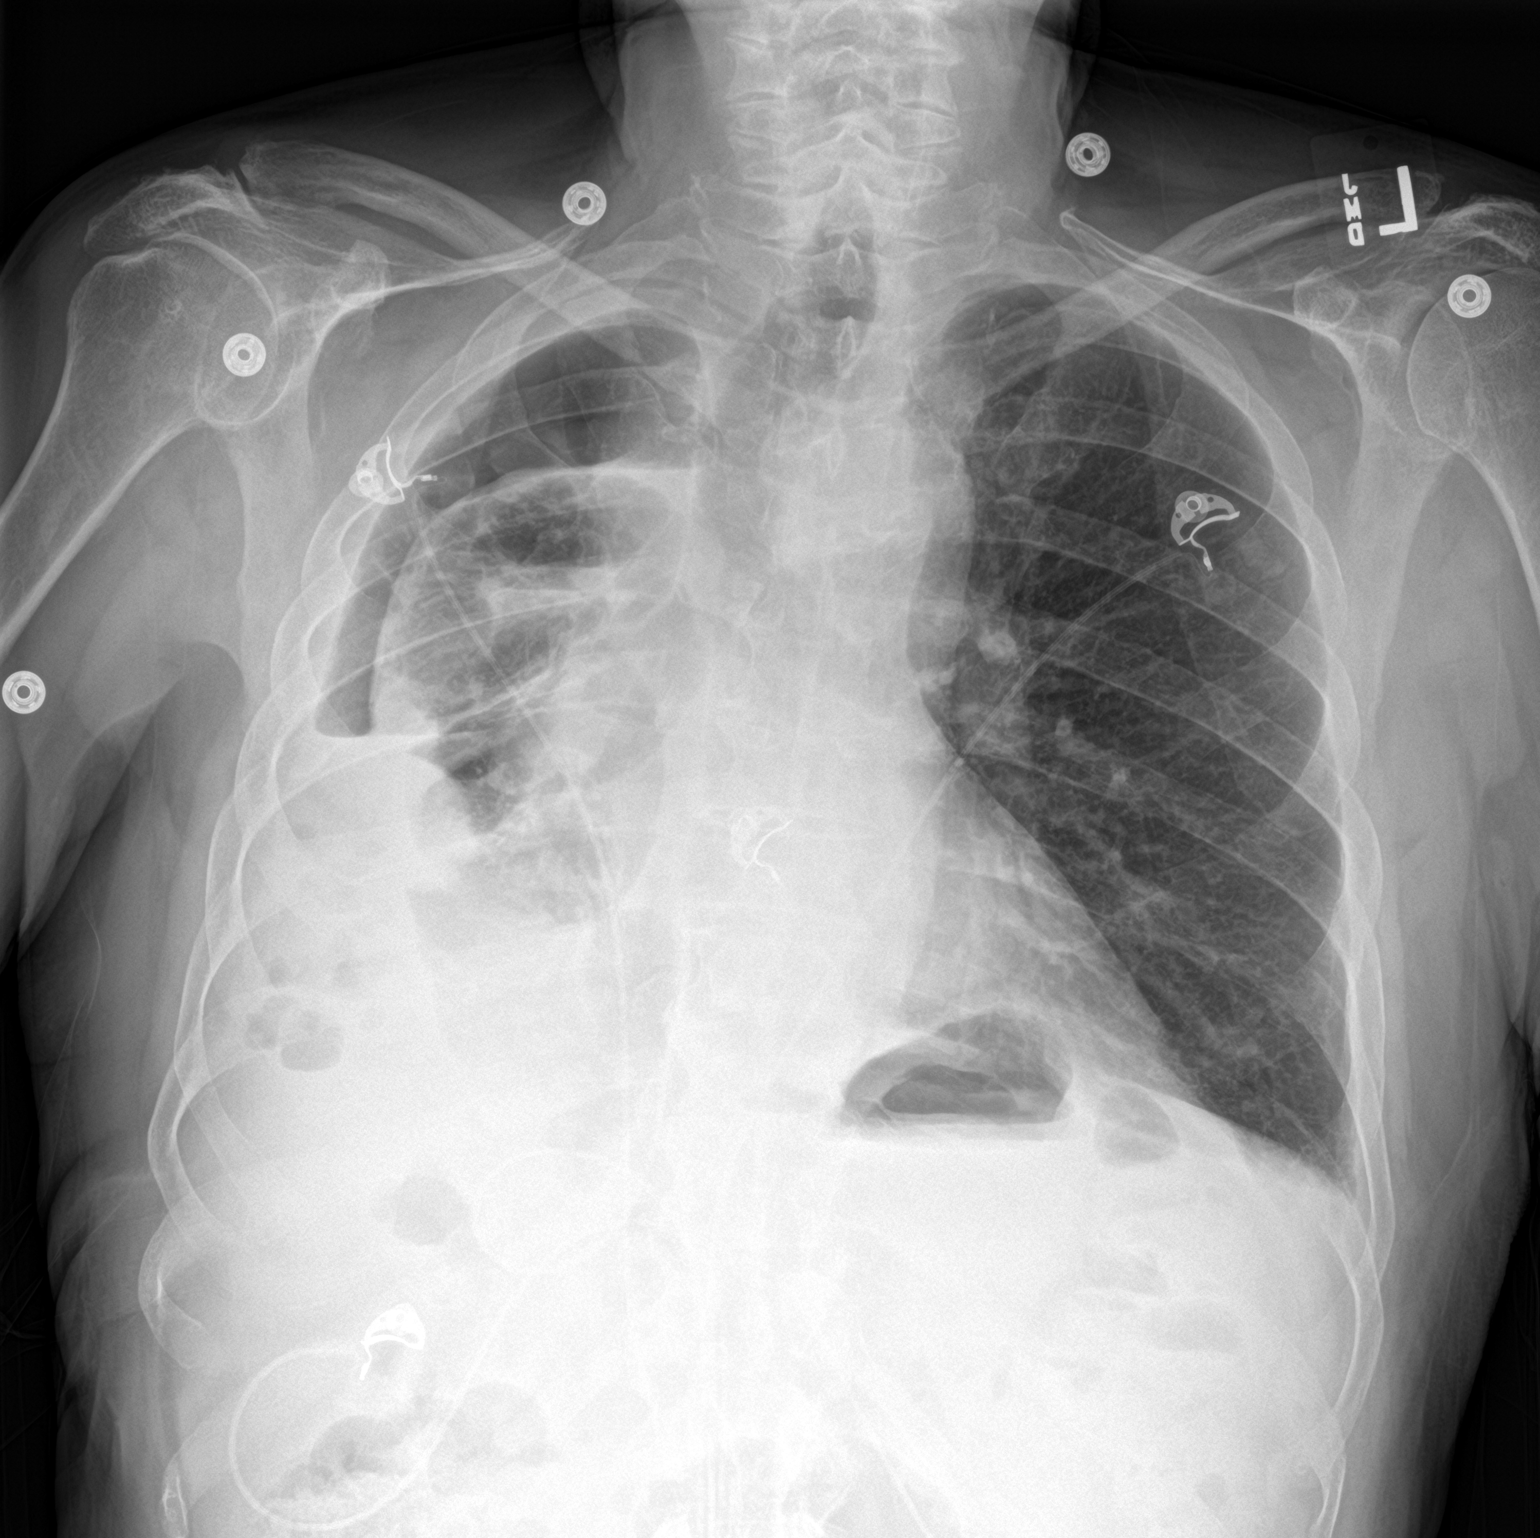

[chest lat]
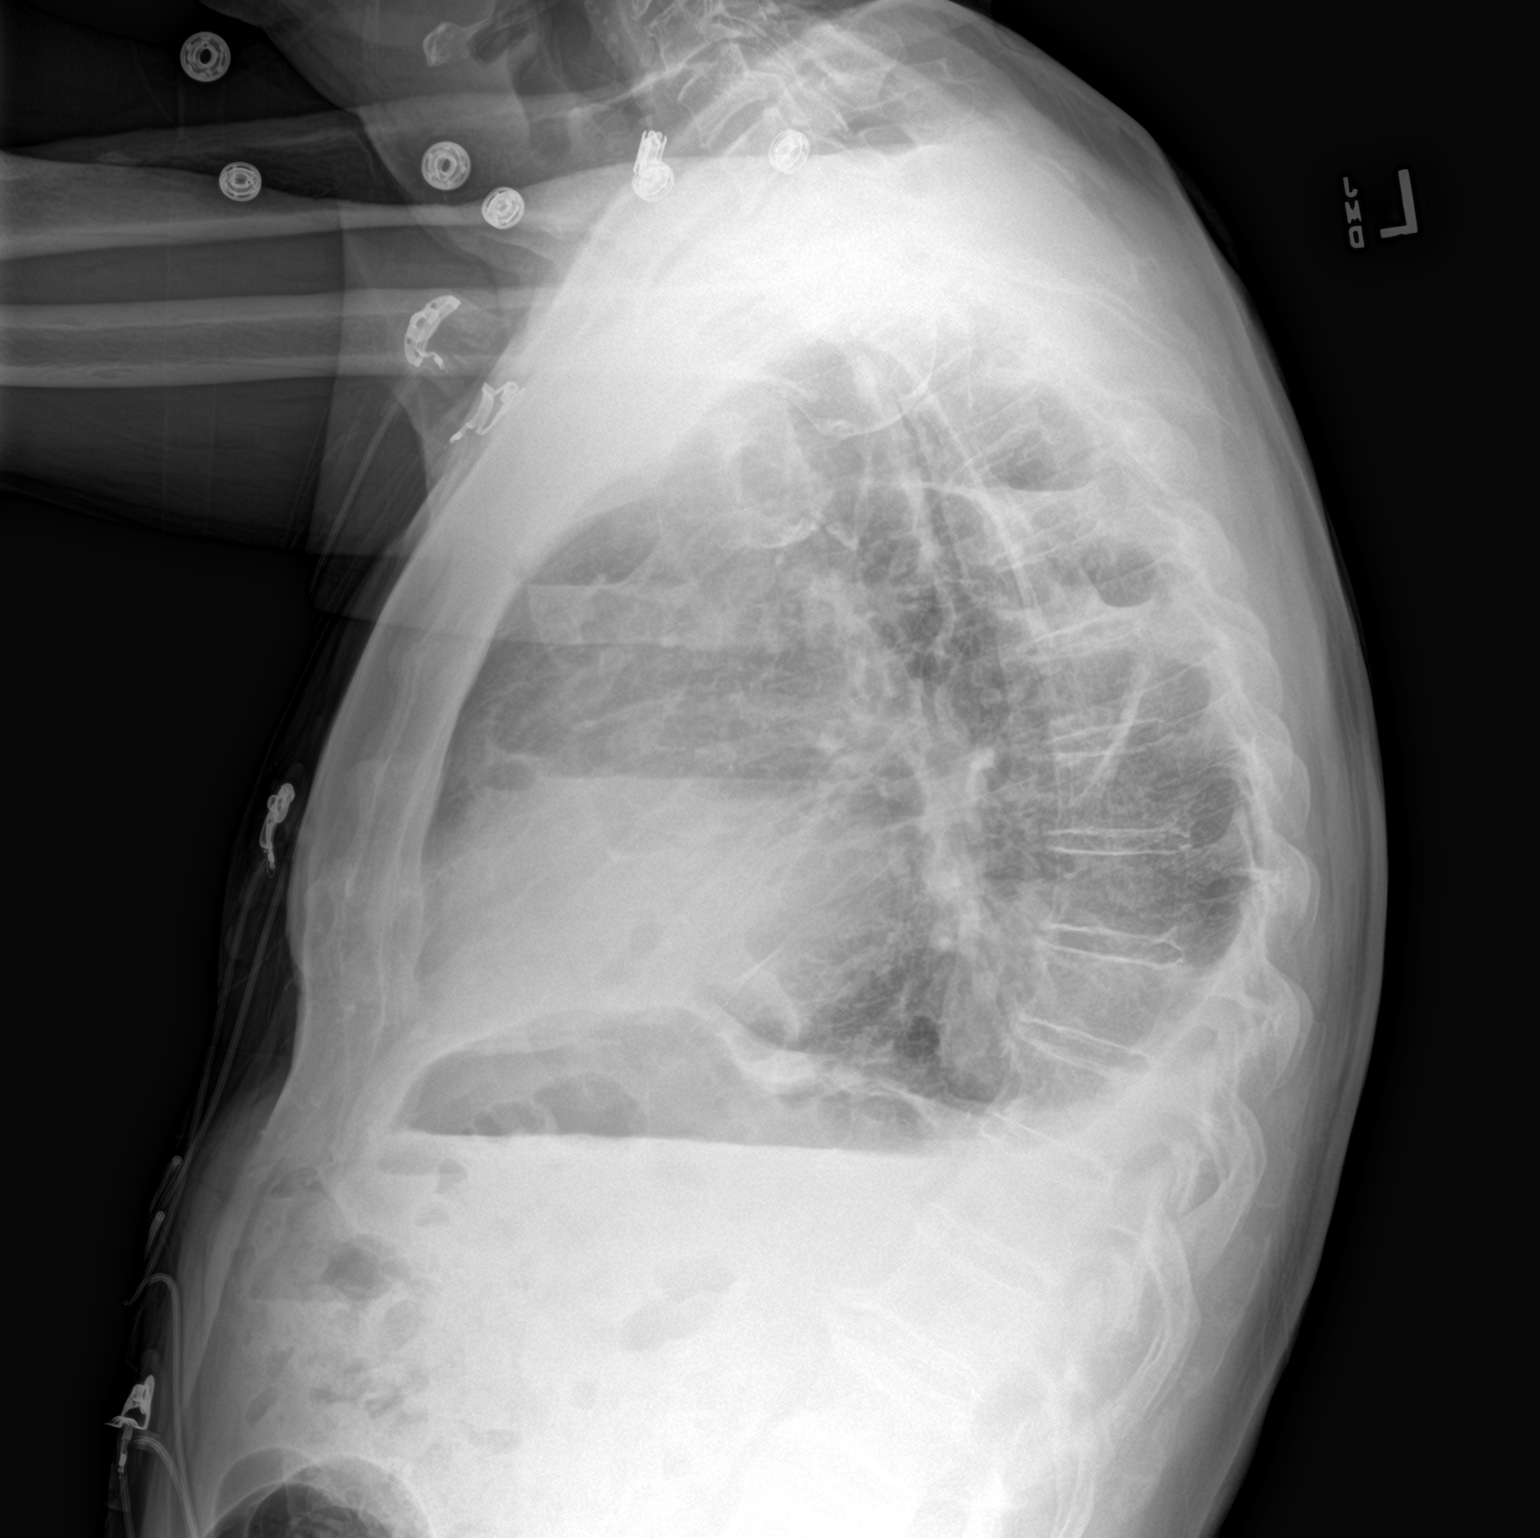

[2 of 2 positions shown; findings below may reference images not displayed]

FINDINGS: Persistent moderate-sized RIGHT hydropneumothorax and RIGHT lung
atelectasis.

Normal heart size, mediastinal contours, and pulmonary vascularity.

LEFT lung clear with minimal effusion blunting the posterior
costophrenic angle.

No acute osseous findings.
IMPRESSION: Persistent moderate RIGHT hydropneumothorax and RIGHT lung
atelectasis.

Minimal LEFT pleural effusion.
